# Patient Record
Sex: Female | Born: 1935
Health system: Southern US, Community
[De-identification: ages and names within clinical notes are randomized; demographics above are authoritative.]

## PROBLEM LIST (undated history)

## (undated) DIAGNOSIS — R7309 Other abnormal glucose: Secondary | ICD-10-CM

## (undated) DIAGNOSIS — I4821 Permanent atrial fibrillation: Secondary | ICD-10-CM

## (undated) DIAGNOSIS — I071 Rheumatic tricuspid insufficiency: Secondary | ICD-10-CM

## (undated) DIAGNOSIS — L02419 Cutaneous abscess of limb, unspecified: Secondary | ICD-10-CM

## (undated) DIAGNOSIS — I10 Essential (primary) hypertension: Secondary | ICD-10-CM

## (undated) DIAGNOSIS — I4891 Unspecified atrial fibrillation: Secondary | ICD-10-CM

## (undated) DIAGNOSIS — I5030 Unspecified diastolic (congestive) heart failure: Secondary | ICD-10-CM

## (undated) DIAGNOSIS — M5126 Other intervertebral disc displacement, lumbar region: Secondary | ICD-10-CM

## (undated) DIAGNOSIS — I429 Cardiomyopathy, unspecified: Secondary | ICD-10-CM

## (undated) DIAGNOSIS — K5730 Diverticulosis of large intestine without perforation or abscess without bleeding: Secondary | ICD-10-CM

## (undated) DIAGNOSIS — E663 Overweight: Secondary | ICD-10-CM

## (undated) DIAGNOSIS — R5383 Other fatigue: Secondary | ICD-10-CM

## (undated) DIAGNOSIS — J309 Allergic rhinitis, unspecified: Secondary | ICD-10-CM

## (undated) DIAGNOSIS — R7303 Prediabetes: Secondary | ICD-10-CM

## (undated) DIAGNOSIS — Z7901 Long term (current) use of anticoagulants: Secondary | ICD-10-CM

## (undated) DIAGNOSIS — I350 Nonrheumatic aortic (valve) stenosis: Secondary | ICD-10-CM

## (undated) DIAGNOSIS — E782 Mixed hyperlipidemia: Secondary | ICD-10-CM

## (undated) DIAGNOSIS — M1990 Unspecified osteoarthritis, unspecified site: Secondary | ICD-10-CM

## (undated) DIAGNOSIS — I2721 Secondary pulmonary arterial hypertension: Secondary | ICD-10-CM

## (undated) DIAGNOSIS — K635 Polyp of colon: Secondary | ICD-10-CM

## (undated) DIAGNOSIS — R161 Splenomegaly, not elsewhere classified: Secondary | ICD-10-CM

## (undated) DIAGNOSIS — I509 Heart failure, unspecified: Secondary | ICD-10-CM

## (undated) DIAGNOSIS — R5381 Other malaise: Secondary | ICD-10-CM

## (undated) DIAGNOSIS — K449 Diaphragmatic hernia without obstruction or gangrene: Secondary | ICD-10-CM

## (undated) DIAGNOSIS — K219 Gastro-esophageal reflux disease without esophagitis: Secondary | ICD-10-CM

## (undated) DIAGNOSIS — E119 Type 2 diabetes mellitus without complications: Secondary | ICD-10-CM

## (undated) DIAGNOSIS — I4892 Unspecified atrial flutter: Secondary | ICD-10-CM

## (undated) DIAGNOSIS — E785 Hyperlipidemia, unspecified: Secondary | ICD-10-CM

## (undated) DIAGNOSIS — J159 Unspecified bacterial pneumonia: Secondary | ICD-10-CM

## (undated) DIAGNOSIS — K222 Esophageal obstruction: Secondary | ICD-10-CM

## (undated) DIAGNOSIS — R609 Edema, unspecified: Secondary | ICD-10-CM

## (undated) HISTORY — DX: Unspecified osteoarthritis, unspecified site: M19.90

## (undated) HISTORY — DX: Edema, unspecified: R60.9

## (undated) HISTORY — DX: Other malaise: R53.81

## (undated) HISTORY — PX: COLONOSCOPY: SHX174

## (undated) HISTORY — DX: Other abnormal glucose: R73.09

## (undated) HISTORY — DX: Secondary pulmonary arterial hypertension: I27.21

## (undated) HISTORY — DX: Prediabetes: R73.03

## (undated) HISTORY — DX: Permanent atrial fibrillation: I48.21

## (undated) HISTORY — DX: Overweight: E66.3

## (undated) HISTORY — DX: Mixed hyperlipidemia: E78.2

## (undated) HISTORY — DX: Other fatigue: R53.83

## (undated) HISTORY — DX: Diverticulosis of large intestine without perforation or abscess without bleeding: K57.30

## (undated) HISTORY — DX: Hyperlipidemia, unspecified: E78.5

## (undated) HISTORY — DX: Rheumatic tricuspid insufficiency: I07.1

## (undated) HISTORY — DX: Esophageal obstruction: K22.2

## (undated) HISTORY — DX: Diaphragmatic hernia without obstruction or gangrene: K44.9

## (undated) HISTORY — DX: Unspecified diastolic (congestive) heart failure: I50.30

## (undated) HISTORY — PX: POLYPECTOMY: SHX149

## (undated) HISTORY — DX: Long term (current) use of anticoagulants: Z79.01

## (undated) HISTORY — DX: Unspecified bacterial pneumonia: J15.9

## (undated) HISTORY — DX: Essential (primary) hypertension: I10

## (undated) HISTORY — DX: Type 2 diabetes mellitus without complications: E11.9

## (undated) HISTORY — DX: Gastro-esophageal reflux disease without esophagitis: K21.9

## (undated) HISTORY — DX: Heart failure, unspecified: I50.9

## (undated) HISTORY — DX: Polyp of colon: K63.5

## (undated) HISTORY — DX: Splenomegaly, not elsewhere classified: R16.1

## (undated) HISTORY — DX: Unspecified atrial flutter: I48.92

## (undated) HISTORY — DX: Other intervertebral disc displacement, lumbar region: M51.26

## (undated) HISTORY — DX: Unspecified atrial fibrillation: I48.91

## (undated) HISTORY — DX: Nonrheumatic aortic (valve) stenosis: I35.0

## (undated) HISTORY — DX: Cutaneous abscess of limb, unspecified: L02.419

## (undated) HISTORY — DX: Cardiomyopathy, unspecified: I42.9

## (undated) HISTORY — PX: OTHER SURGICAL HISTORY: SHX169

## (undated) HISTORY — DX: Allergic rhinitis, unspecified: J30.9

---

## 1969-04-27 HISTORY — PX: ABDOMINAL HYSTERECTOMY: SHX81

## 1999-04-28 HISTORY — PX: ANKLE SURGERY: SHX546

## 1999-08-08 ENCOUNTER — Inpatient Hospital Stay (HOSPITAL_COMMUNITY): Admission: EM | Admit: 1999-08-08 | Discharge: 1999-08-11 | Payer: Self-pay | Admitting: *Deleted

## 1999-08-08 ENCOUNTER — Encounter: Payer: Self-pay | Admitting: Pulmonary Disease

## 1999-12-19 ENCOUNTER — Encounter (INDEPENDENT_AMBULATORY_CARE_PROVIDER_SITE_OTHER): Payer: Self-pay | Admitting: Specialist

## 1999-12-19 ENCOUNTER — Observation Stay (HOSPITAL_COMMUNITY): Admission: RE | Admit: 1999-12-19 | Discharge: 1999-12-20 | Payer: Self-pay | Admitting: Orthopedic Surgery

## 2000-01-26 ENCOUNTER — Encounter: Admission: RE | Admit: 2000-01-26 | Discharge: 2000-02-16 | Payer: Self-pay | Admitting: Orthopedic Surgery

## 2000-03-17 ENCOUNTER — Other Ambulatory Visit: Admission: RE | Admit: 2000-03-17 | Discharge: 2000-03-17 | Payer: Self-pay | Admitting: Obstetrics and Gynecology

## 2000-08-26 ENCOUNTER — Encounter: Payer: Self-pay | Admitting: Gastroenterology

## 2000-08-26 ENCOUNTER — Other Ambulatory Visit: Admission: RE | Admit: 2000-08-26 | Discharge: 2000-08-26 | Payer: Self-pay | Admitting: Gastroenterology

## 2000-08-26 ENCOUNTER — Encounter (INDEPENDENT_AMBULATORY_CARE_PROVIDER_SITE_OTHER): Payer: Self-pay | Admitting: Specialist

## 2000-08-26 DIAGNOSIS — D126 Benign neoplasm of colon, unspecified: Secondary | ICD-10-CM | POA: Insufficient documentation

## 2000-08-26 DIAGNOSIS — K5730 Diverticulosis of large intestine without perforation or abscess without bleeding: Secondary | ICD-10-CM | POA: Insufficient documentation

## 2000-09-28 ENCOUNTER — Ambulatory Visit (HOSPITAL_COMMUNITY): Admission: RE | Admit: 2000-09-28 | Discharge: 2000-09-28 | Payer: Self-pay | Admitting: *Deleted

## 2002-04-27 HISTORY — PX: CHOLECYSTECTOMY: SHX55

## 2004-03-26 ENCOUNTER — Encounter: Payer: Self-pay | Admitting: Family Medicine

## 2006-09-03 ENCOUNTER — Encounter: Payer: Self-pay | Admitting: Family Medicine

## 2006-10-20 ENCOUNTER — Encounter: Payer: Self-pay | Admitting: Cardiology

## 2006-11-12 ENCOUNTER — Encounter: Payer: Self-pay | Admitting: Family Medicine

## 2006-11-19 ENCOUNTER — Ambulatory Visit: Payer: Self-pay | Admitting: Cardiology

## 2006-11-19 ENCOUNTER — Encounter: Payer: Self-pay | Admitting: Family Medicine

## 2006-12-16 ENCOUNTER — Ambulatory Visit: Payer: Self-pay | Admitting: Family Medicine

## 2006-12-16 DIAGNOSIS — M25569 Pain in unspecified knee: Secondary | ICD-10-CM | POA: Insufficient documentation

## 2006-12-16 DIAGNOSIS — E785 Hyperlipidemia, unspecified: Secondary | ICD-10-CM

## 2006-12-16 DIAGNOSIS — M1990 Unspecified osteoarthritis, unspecified site: Secondary | ICD-10-CM | POA: Insufficient documentation

## 2006-12-16 DIAGNOSIS — I509 Heart failure, unspecified: Secondary | ICD-10-CM | POA: Insufficient documentation

## 2006-12-16 DIAGNOSIS — R609 Edema, unspecified: Secondary | ICD-10-CM | POA: Insufficient documentation

## 2006-12-16 DIAGNOSIS — K449 Diaphragmatic hernia without obstruction or gangrene: Secondary | ICD-10-CM | POA: Insufficient documentation

## 2006-12-16 DIAGNOSIS — I152 Hypertension secondary to endocrine disorders: Secondary | ICD-10-CM | POA: Insufficient documentation

## 2006-12-16 DIAGNOSIS — E1169 Type 2 diabetes mellitus with other specified complication: Secondary | ICD-10-CM | POA: Insufficient documentation

## 2006-12-16 DIAGNOSIS — I5030 Unspecified diastolic (congestive) heart failure: Secondary | ICD-10-CM | POA: Insufficient documentation

## 2006-12-16 DIAGNOSIS — I4891 Unspecified atrial fibrillation: Secondary | ICD-10-CM | POA: Insufficient documentation

## 2006-12-16 DIAGNOSIS — M5126 Other intervertebral disc displacement, lumbar region: Secondary | ICD-10-CM | POA: Insufficient documentation

## 2006-12-16 DIAGNOSIS — I10 Essential (primary) hypertension: Secondary | ICD-10-CM

## 2006-12-16 DIAGNOSIS — K219 Gastro-esophageal reflux disease without esophagitis: Secondary | ICD-10-CM | POA: Insufficient documentation

## 2006-12-16 LAB — CONVERTED CEMR LAB
INR: 2.9
Prothrombin Time: 20.7 s

## 2006-12-21 ENCOUNTER — Ambulatory Visit: Payer: Self-pay | Admitting: Family Medicine

## 2006-12-21 DIAGNOSIS — M810 Age-related osteoporosis without current pathological fracture: Secondary | ICD-10-CM | POA: Insufficient documentation

## 2006-12-22 ENCOUNTER — Encounter: Payer: Self-pay | Admitting: Family Medicine

## 2006-12-24 DIAGNOSIS — E118 Type 2 diabetes mellitus with unspecified complications: Secondary | ICD-10-CM | POA: Insufficient documentation

## 2006-12-24 LAB — CONVERTED CEMR LAB
ALT: 16 units/L (ref 0–35)
AST: 14 units/L (ref 0–37)
Albumin: 3.9 g/dL (ref 3.5–5.2)
Alkaline Phosphatase: 119 units/L — ABNORMAL HIGH (ref 39–117)
BUN: 8 mg/dL (ref 6–23)
Bilirubin, Direct: 0.2 mg/dL (ref 0.0–0.3)
CO2: 32 meq/L (ref 19–32)
Calcium: 9.7 mg/dL (ref 8.4–10.5)
Chloride: 105 meq/L (ref 96–112)
Cholesterol: 196 mg/dL (ref 0–200)
Creatinine, Ser: 0.7 mg/dL (ref 0.4–1.2)
GFR calc Af Amer: 105 mL/min
GFR calc non Af Amer: 87 mL/min
Glucose, Bld: 103 mg/dL — ABNORMAL HIGH (ref 70–99)
HDL: 45.5 mg/dL (ref >39.0)
LDL Cholesterol: 133 mg/dL — ABNORMAL HIGH (ref 0–99)
Potassium: 4.8 meq/L (ref 3.5–5.1)
Sodium: 141 meq/L (ref 135–145)
Total Bilirubin: 0.8 mg/dL (ref 0.3–1.2)
Total CHOL/HDL Ratio: 4.3
Total Protein: 6.9 g/dL (ref 6.0–8.3)
Triglycerides: 89 mg/dL (ref 0–149)
VLDL: 18 mg/dL (ref 0–40)

## 2006-12-27 LAB — CONVERTED CEMR LAB

## 2007-01-04 ENCOUNTER — Encounter: Payer: Self-pay | Admitting: Family Medicine

## 2007-01-05 ENCOUNTER — Ambulatory Visit: Payer: Self-pay | Admitting: Family Medicine

## 2007-01-10 ENCOUNTER — Telehealth: Payer: Self-pay | Admitting: Family Medicine

## 2007-01-12 ENCOUNTER — Ambulatory Visit: Payer: Self-pay | Admitting: Family Medicine

## 2007-01-12 LAB — CONVERTED CEMR LAB
INR: 6.9
INR: 6.9 (ref 0.8–1.0)
Prothrombin Time: 34.2 s
Prothrombin Time: 34.2 s (ref 10.9–13.3)

## 2007-01-14 ENCOUNTER — Ambulatory Visit: Payer: Self-pay | Admitting: Family Medicine

## 2007-01-14 LAB — CONVERTED CEMR LAB
INR: 4.6 — ABNORMAL HIGH (ref 0.8–1.0)
Prothrombin Time: 27.6 s — ABNORMAL HIGH (ref 10.9–13.3)

## 2007-01-17 ENCOUNTER — Ambulatory Visit: Payer: Self-pay | Admitting: Family Medicine

## 2007-01-17 ENCOUNTER — Ambulatory Visit: Payer: Self-pay | Admitting: Gastroenterology

## 2007-01-17 LAB — CONVERTED CEMR LAB
Basophils Absolute: 0.1 10*3/uL (ref 0.0–0.1)
Basophils Relative: 0.7 % (ref 0.0–1.0)
Eosinophils Absolute: 0.3 10*3/uL (ref 0.0–0.6)
Eosinophils Relative: 3.5 % (ref 0.0–5.0)
HCT: 39.7 % (ref 36.0–46.0)
Hemoglobin: 13.6 g/dL (ref 12.0–15.0)
INR: 1.5
Lymphocytes Relative: 32.1 % (ref 12.0–46.0)
MCHC: 34.2 g/dL (ref 30.0–36.0)
MCV: 89.1 fL (ref 78.0–100.0)
Monocytes Absolute: 0.7 10*3/uL (ref 0.2–0.7)
Monocytes Relative: 9 % (ref 3.0–11.0)
Neutro Abs: 3.9 10*3/uL (ref 1.4–7.7)
Neutrophils Relative %: 54.7 % (ref 43.0–77.0)
Platelets: 284 10*3/uL (ref 150–400)
Prothrombin Time: 15.3 s
RBC: 4.46 M/uL (ref 3.87–5.11)
RDW: 14.3 % (ref 11.5–14.6)
WBC: 7.4 10*3/uL (ref 4.5–10.5)

## 2007-01-18 ENCOUNTER — Encounter: Payer: Self-pay | Admitting: Family Medicine

## 2007-01-19 ENCOUNTER — Ambulatory Visit: Payer: Self-pay | Admitting: Family Medicine

## 2007-01-26 HISTORY — PX: ESOPHAGEAL DILATION: SHX303

## 2007-02-01 ENCOUNTER — Encounter: Payer: Self-pay | Admitting: Family Medicine

## 2007-02-01 ENCOUNTER — Encounter: Payer: Self-pay | Admitting: Gastroenterology

## 2007-02-01 ENCOUNTER — Ambulatory Visit: Payer: Self-pay | Admitting: Gastroenterology

## 2007-02-01 DIAGNOSIS — D139 Benign neoplasm of ill-defined sites within the digestive system: Secondary | ICD-10-CM | POA: Insufficient documentation

## 2007-02-01 DIAGNOSIS — K222 Esophageal obstruction: Secondary | ICD-10-CM | POA: Insufficient documentation

## 2007-02-10 ENCOUNTER — Encounter: Payer: Self-pay | Admitting: Family Medicine

## 2007-02-17 ENCOUNTER — Ambulatory Visit: Payer: Self-pay | Admitting: Family Medicine

## 2007-02-17 LAB — CONVERTED CEMR LAB
INR: 3.7
Prothrombin Time: 23.2 s

## 2007-02-18 LAB — CONVERTED CEMR LAB
TSH: 1.38 microintl units/mL (ref 0.35–5.50)
Vitamin B-12: 429 pg/mL (ref 211–911)

## 2007-03-03 ENCOUNTER — Ambulatory Visit: Payer: Self-pay | Admitting: Family Medicine

## 2007-03-03 LAB — CONVERTED CEMR LAB: INR: 4.8

## 2007-03-07 ENCOUNTER — Ambulatory Visit: Payer: Self-pay | Admitting: Family Medicine

## 2007-03-07 LAB — CONVERTED CEMR LAB
INR: 1.3
Prothrombin Time: 14.1 s

## 2007-03-11 ENCOUNTER — Ambulatory Visit: Payer: Self-pay | Admitting: Family Medicine

## 2007-03-11 LAB — CONVERTED CEMR LAB
Bilirubin Urine: NEGATIVE
Epithelial cells, urine: 0 /lpf
KOH Prep: 0
Ketones, urine, test strip: NEGATIVE
Nitrite: NEGATIVE
RBC / HPF: 0
Specific Gravity, Urine: >=1.03
Urine crystals, microscopic: 0 /hpf
Urobilinogen, UA: NEGATIVE

## 2007-03-14 ENCOUNTER — Ambulatory Visit: Payer: Self-pay | Admitting: Family Medicine

## 2007-03-14 LAB — CONVERTED CEMR LAB
INR: 4.1
Prothrombin Time: 24.6 s

## 2007-03-21 ENCOUNTER — Ambulatory Visit: Payer: Self-pay | Admitting: Family Medicine

## 2007-03-28 ENCOUNTER — Ambulatory Visit: Payer: Self-pay | Admitting: Family Medicine

## 2007-04-07 ENCOUNTER — Ambulatory Visit: Payer: Self-pay | Admitting: Cardiology

## 2007-04-07 LAB — CONVERTED CEMR LAB
CO2: 32 meq/L (ref 19–32)
Calcium: 9.3 mg/dL (ref 8.4–10.5)
Chloride: 105 meq/L (ref 96–112)
Creatinine, Ser: 0.8 mg/dL (ref 0.4–1.2)
Glucose, Bld: 96 mg/dL (ref 70–99)
Sodium: 142 meq/L (ref 135–145)

## 2007-04-11 ENCOUNTER — Ambulatory Visit: Payer: Self-pay | Admitting: Family Medicine

## 2007-04-11 LAB — CONVERTED CEMR LAB: Prothrombin Time: 25.3 s

## 2007-04-13 ENCOUNTER — Encounter: Payer: Self-pay | Admitting: Family Medicine

## 2007-04-14 ENCOUNTER — Telehealth: Payer: Self-pay | Admitting: Family Medicine

## 2007-04-15 ENCOUNTER — Ambulatory Visit: Payer: Self-pay | Admitting: Family Medicine

## 2007-04-15 LAB — CONVERTED CEMR LAB
INR: 3
Prothrombin Time: 20.8 s

## 2007-04-19 ENCOUNTER — Ambulatory Visit: Payer: Self-pay | Admitting: Cardiology

## 2007-04-22 ENCOUNTER — Ambulatory Visit: Payer: Self-pay | Admitting: Family Medicine

## 2007-04-22 LAB — CONVERTED CEMR LAB
INR: 2.2
Prothrombin Time: 18.2 s

## 2007-04-27 ENCOUNTER — Ambulatory Visit: Payer: Self-pay

## 2007-04-27 ENCOUNTER — Encounter: Payer: Self-pay | Admitting: Family Medicine

## 2007-05-02 ENCOUNTER — Ambulatory Visit: Payer: Self-pay | Admitting: Family Medicine

## 2007-05-02 ENCOUNTER — Telehealth: Payer: Self-pay | Admitting: Family Medicine

## 2007-05-04 ENCOUNTER — Ambulatory Visit: Payer: Self-pay | Admitting: Cardiology

## 2007-05-16 ENCOUNTER — Telehealth: Payer: Self-pay | Admitting: Family Medicine

## 2007-05-16 ENCOUNTER — Ambulatory Visit: Payer: Self-pay | Admitting: Family Medicine

## 2007-05-16 LAB — CONVERTED CEMR LAB: Prothrombin Time: 15.9 s

## 2007-05-19 ENCOUNTER — Ambulatory Visit: Payer: Self-pay | Admitting: Family Medicine

## 2007-05-30 ENCOUNTER — Ambulatory Visit: Payer: Self-pay | Admitting: Family Medicine

## 2007-06-13 ENCOUNTER — Ambulatory Visit: Payer: Self-pay | Admitting: Family Medicine

## 2007-06-13 LAB — CONVERTED CEMR LAB: INR: 1.6

## 2007-06-15 ENCOUNTER — Telehealth: Payer: Self-pay | Admitting: Family Medicine

## 2007-06-15 ENCOUNTER — Ambulatory Visit: Payer: Self-pay | Admitting: Family Medicine

## 2007-06-22 LAB — CONVERTED CEMR LAB
AST: 14 units/L (ref 0–37)
Bilirubin, Direct: 0.1 mg/dL (ref 0.0–0.3)
CO2: 31 meq/L (ref 19–32)
Chloride: 106 meq/L (ref 96–112)
Cholesterol: 170 mg/dL (ref 0–200)
Creatinine, Ser: 1 mg/dL (ref 0.4–1.2)
Glucose, Bld: 107 mg/dL — ABNORMAL HIGH (ref 70–99)
HDL: 46.4 mg/dL (ref >39.0)
Sodium: 142 meq/L (ref 135–145)
Total Bilirubin: 0.7 mg/dL (ref 0.3–1.2)

## 2007-06-29 ENCOUNTER — Ambulatory Visit: Payer: Self-pay | Admitting: Family Medicine

## 2007-06-29 ENCOUNTER — Telehealth: Payer: Self-pay | Admitting: Family Medicine

## 2007-06-29 LAB — CONVERTED CEMR LAB: INR: 2.6

## 2007-07-27 ENCOUNTER — Ambulatory Visit: Payer: Self-pay | Admitting: Family Medicine

## 2007-07-27 LAB — CONVERTED CEMR LAB: INR: 2.2

## 2007-08-23 ENCOUNTER — Ambulatory Visit: Payer: Self-pay | Admitting: Family Medicine

## 2007-08-23 LAB — CONVERTED CEMR LAB
INR: 3.2
Prothrombin Time: 21.5 s

## 2007-09-06 ENCOUNTER — Ambulatory Visit: Payer: Self-pay | Admitting: Family Medicine

## 2007-09-06 LAB — CONVERTED CEMR LAB
INR: 3.1
Prothrombin Time: 21.1 s

## 2007-09-21 ENCOUNTER — Ambulatory Visit: Payer: Self-pay | Admitting: Family Medicine

## 2007-10-19 ENCOUNTER — Ambulatory Visit: Payer: Self-pay | Admitting: Family Medicine

## 2007-10-19 LAB — CONVERTED CEMR LAB: Prothrombin Time: 22 s

## 2007-10-24 ENCOUNTER — Ambulatory Visit: Payer: Self-pay | Admitting: Cardiology

## 2007-11-16 ENCOUNTER — Ambulatory Visit: Payer: Self-pay | Admitting: Family Medicine

## 2007-11-16 LAB — CONVERTED CEMR LAB
INR: 3.8
Prothrombin Time: 23.6 s

## 2007-11-29 ENCOUNTER — Ambulatory Visit: Payer: Self-pay | Admitting: Family Medicine

## 2007-12-07 ENCOUNTER — Ambulatory Visit: Payer: Self-pay | Admitting: Family Medicine

## 2007-12-13 ENCOUNTER — Ambulatory Visit: Payer: Self-pay | Admitting: Family Medicine

## 2007-12-13 LAB — CONVERTED CEMR LAB: Prothrombin Time: 25.8 s

## 2007-12-20 ENCOUNTER — Ambulatory Visit: Payer: Self-pay | Admitting: Family Medicine

## 2007-12-27 ENCOUNTER — Ambulatory Visit: Payer: Self-pay | Admitting: Cardiology

## 2008-01-09 ENCOUNTER — Ambulatory Visit: Payer: Self-pay | Admitting: Family Medicine

## 2008-01-09 LAB — CONVERTED CEMR LAB: Prothrombin Time: 21 s

## 2008-01-11 LAB — CONVERTED CEMR LAB
Cholesterol: 130 mg/dL (ref 0–200)
HDL: 40.8 mg/dL (ref >39.0)
Total CHOL/HDL Ratio: 3.2
Triglycerides: 75 mg/dL (ref 0–149)

## 2008-01-12 ENCOUNTER — Telehealth: Payer: Self-pay | Admitting: Gastroenterology

## 2008-01-12 ENCOUNTER — Ambulatory Visit: Payer: Self-pay | Admitting: Family Medicine

## 2008-01-16 ENCOUNTER — Telehealth: Payer: Self-pay | Admitting: Gastroenterology

## 2008-01-25 ENCOUNTER — Ambulatory Visit: Payer: Self-pay | Admitting: Gastroenterology

## 2008-01-26 ENCOUNTER — Telehealth: Payer: Self-pay | Admitting: Family Medicine

## 2008-02-01 ENCOUNTER — Encounter: Payer: Self-pay | Admitting: Gastroenterology

## 2008-02-01 ENCOUNTER — Ambulatory Visit: Payer: Self-pay | Admitting: Gastroenterology

## 2008-02-01 LAB — HM COLONOSCOPY

## 2008-02-02 ENCOUNTER — Encounter: Payer: Self-pay | Admitting: Family Medicine

## 2008-02-02 ENCOUNTER — Telehealth: Payer: Self-pay | Admitting: Family Medicine

## 2008-02-06 ENCOUNTER — Encounter: Payer: Self-pay | Admitting: Gastroenterology

## 2008-02-06 ENCOUNTER — Ambulatory Visit: Payer: Self-pay | Admitting: Family Medicine

## 2008-02-06 LAB — CONVERTED CEMR LAB: INR: 1.4

## 2008-02-17 ENCOUNTER — Telehealth: Payer: Self-pay | Admitting: Gastroenterology

## 2008-02-20 ENCOUNTER — Telehealth: Payer: Self-pay | Admitting: Family Medicine

## 2008-02-20 ENCOUNTER — Encounter (INDEPENDENT_AMBULATORY_CARE_PROVIDER_SITE_OTHER): Payer: Self-pay | Admitting: *Deleted

## 2008-02-20 ENCOUNTER — Ambulatory Visit: Payer: Self-pay | Admitting: Family Medicine

## 2008-02-20 LAB — CONVERTED CEMR LAB
INR: 3.2
Prothrombin Time: 21.6 s

## 2008-02-28 ENCOUNTER — Ambulatory Visit: Payer: Self-pay | Admitting: Family Medicine

## 2008-03-06 ENCOUNTER — Telehealth: Payer: Self-pay | Admitting: Family Medicine

## 2008-03-08 ENCOUNTER — Ambulatory Visit: Payer: Self-pay | Admitting: Family Medicine

## 2008-03-08 LAB — CONVERTED CEMR LAB
INR: 2.2
Prothrombin Time: 18 s

## 2008-03-19 ENCOUNTER — Telehealth: Payer: Self-pay | Admitting: Family Medicine

## 2008-03-23 ENCOUNTER — Ambulatory Visit: Payer: Self-pay | Admitting: Family Medicine

## 2008-03-23 DIAGNOSIS — R05 Cough: Secondary | ICD-10-CM

## 2008-03-23 DIAGNOSIS — J301 Allergic rhinitis due to pollen: Secondary | ICD-10-CM | POA: Insufficient documentation

## 2008-03-23 DIAGNOSIS — R059 Cough, unspecified: Secondary | ICD-10-CM | POA: Insufficient documentation

## 2008-04-05 ENCOUNTER — Ambulatory Visit: Payer: Self-pay | Admitting: Family Medicine

## 2008-04-06 ENCOUNTER — Telehealth: Payer: Self-pay | Admitting: Family Medicine

## 2008-04-10 ENCOUNTER — Telehealth: Payer: Self-pay | Admitting: Family Medicine

## 2008-05-03 ENCOUNTER — Ambulatory Visit: Payer: Self-pay | Admitting: Family Medicine

## 2008-05-03 LAB — CONVERTED CEMR LAB: Prothrombin Time: 20.2 s

## 2008-05-15 ENCOUNTER — Telehealth: Payer: Self-pay | Admitting: Family Medicine

## 2008-05-30 ENCOUNTER — Ambulatory Visit: Payer: Self-pay | Admitting: Family Medicine

## 2008-05-30 LAB — CONVERTED CEMR LAB: INR: 2.5

## 2008-06-27 ENCOUNTER — Ambulatory Visit: Payer: Self-pay | Admitting: Family Medicine

## 2008-06-27 LAB — CONVERTED CEMR LAB
INR: 2.6
Prothrombin Time: 19.7 s

## 2008-07-02 ENCOUNTER — Ambulatory Visit: Payer: Self-pay | Admitting: Family Medicine

## 2008-07-03 ENCOUNTER — Ambulatory Visit: Payer: Self-pay | Admitting: Cardiology

## 2008-07-11 ENCOUNTER — Ambulatory Visit: Payer: Self-pay | Admitting: Family Medicine

## 2008-07-11 LAB — CONVERTED CEMR LAB
ALT: 11 units/L (ref 0–35)
Albumin: 3.6 g/dL (ref 3.5–5.2)
BUN: 14 mg/dL (ref 6–23)
CO2: 32 meq/L (ref 19–32)
Calcium: 9.1 mg/dL (ref 8.4–10.5)
Chloride: 106 meq/L (ref 96–112)
Cholesterol: 147 mg/dL (ref 0–200)
Creatinine, Ser: 0.8 mg/dL (ref 0.4–1.2)
Glucose, Bld: 106 mg/dL — ABNORMAL HIGH (ref 70–99)
HDL: 46.3 mg/dL (ref >39.00)
Total Protein: 6 g/dL (ref 6.0–8.3)
Triglycerides: 97 mg/dL (ref 0.0–149.0)

## 2008-07-13 ENCOUNTER — Ambulatory Visit: Payer: Self-pay | Admitting: Family Medicine

## 2008-07-20 ENCOUNTER — Telehealth: Payer: Self-pay | Admitting: Family Medicine

## 2008-07-23 ENCOUNTER — Telehealth: Payer: Self-pay | Admitting: Family Medicine

## 2008-07-25 ENCOUNTER — Ambulatory Visit: Payer: Self-pay | Admitting: Family Medicine

## 2008-07-25 LAB — CONVERTED CEMR LAB: INR: 1.7

## 2008-08-02 ENCOUNTER — Ambulatory Visit: Payer: Self-pay | Admitting: Family Medicine

## 2008-08-02 LAB — CONVERTED CEMR LAB: Prothrombin Time: 21.6 s

## 2008-08-09 ENCOUNTER — Telehealth: Payer: Self-pay | Admitting: Gastroenterology

## 2008-08-17 ENCOUNTER — Ambulatory Visit: Payer: Self-pay | Admitting: Family Medicine

## 2008-08-17 LAB — CONVERTED CEMR LAB: INR: 2.3

## 2008-09-06 ENCOUNTER — Telehealth: Payer: Self-pay | Admitting: Family Medicine

## 2008-09-17 ENCOUNTER — Ambulatory Visit: Payer: Self-pay | Admitting: Family Medicine

## 2008-09-21 ENCOUNTER — Telehealth: Payer: Self-pay | Admitting: Family Medicine

## 2008-10-02 DIAGNOSIS — I429 Cardiomyopathy, unspecified: Secondary | ICD-10-CM | POA: Insufficient documentation

## 2008-10-15 ENCOUNTER — Ambulatory Visit: Payer: Self-pay | Admitting: Family Medicine

## 2008-10-15 LAB — CONVERTED CEMR LAB
INR: 2.5
Prothrombin Time: 19.3 s

## 2008-11-01 ENCOUNTER — Ambulatory Visit: Payer: Self-pay | Admitting: Family Medicine

## 2008-11-13 ENCOUNTER — Ambulatory Visit: Payer: Self-pay | Admitting: Family Medicine

## 2008-11-13 LAB — CONVERTED CEMR LAB: Prothrombin Time: 16.2 s

## 2008-11-16 ENCOUNTER — Telehealth: Payer: Self-pay | Admitting: Family Medicine

## 2008-11-19 ENCOUNTER — Ambulatory Visit: Payer: Self-pay | Admitting: Family Medicine

## 2008-11-19 DIAGNOSIS — M545 Low back pain, unspecified: Secondary | ICD-10-CM | POA: Insufficient documentation

## 2008-11-27 ENCOUNTER — Ambulatory Visit: Payer: Self-pay | Admitting: Family Medicine

## 2008-11-27 LAB — CONVERTED CEMR LAB
INR: 2.4
Prothrombin Time: 18.7 s

## 2008-11-28 ENCOUNTER — Telehealth: Payer: Self-pay | Admitting: Family Medicine

## 2008-12-11 ENCOUNTER — Encounter: Admission: RE | Admit: 2008-12-11 | Discharge: 2008-12-11 | Payer: Self-pay | Admitting: Family Medicine

## 2008-12-11 ENCOUNTER — Ambulatory Visit: Payer: Self-pay | Admitting: Family Medicine

## 2008-12-11 DIAGNOSIS — M79609 Pain in unspecified limb: Secondary | ICD-10-CM | POA: Insufficient documentation

## 2008-12-25 ENCOUNTER — Ambulatory Visit: Payer: Self-pay | Admitting: Family Medicine

## 2009-01-01 ENCOUNTER — Ambulatory Visit: Payer: Self-pay | Admitting: Family Medicine

## 2009-01-02 ENCOUNTER — Encounter: Payer: Self-pay | Admitting: Family Medicine

## 2009-01-16 ENCOUNTER — Encounter: Payer: Self-pay | Admitting: Family Medicine

## 2009-01-18 ENCOUNTER — Encounter: Payer: Self-pay | Admitting: Family Medicine

## 2009-01-21 ENCOUNTER — Telehealth: Payer: Self-pay | Admitting: Family Medicine

## 2009-01-22 ENCOUNTER — Ambulatory Visit: Payer: Self-pay | Admitting: Family Medicine

## 2009-01-22 LAB — CONVERTED CEMR LAB
INR: 3.1
Prothrombin Time: 21.4 s

## 2009-01-31 ENCOUNTER — Ambulatory Visit: Payer: Self-pay | Admitting: Family Medicine

## 2009-02-07 ENCOUNTER — Encounter: Payer: Self-pay | Admitting: Family Medicine

## 2009-02-19 ENCOUNTER — Ambulatory Visit: Payer: Self-pay | Admitting: Family Medicine

## 2009-02-19 LAB — CONVERTED CEMR LAB: INR: 2.3

## 2009-02-20 LAB — CONVERTED CEMR LAB
ALT: 14 units/L (ref 0–35)
BUN: 10 mg/dL (ref 6–23)
Basophils Absolute: 0 10*3/uL (ref 0.0–0.1)
Bilirubin, Direct: 0 mg/dL (ref 0.0–0.3)
Chloride: 104 meq/L (ref 96–112)
Cholesterol: 160 mg/dL (ref 0–200)
Creatinine, Ser: 0.8 mg/dL (ref 0.4–1.2)
Eosinophils Absolute: 0.2 10*3/uL (ref 0.0–0.7)
Glucose, Bld: 103 mg/dL — ABNORMAL HIGH (ref 70–99)
HCT: 39.8 % (ref 36.0–46.0)
Hgb A1c MFr Bld: 6.4 % (ref 4.6–6.5)
LDL Cholesterol: 90 mg/dL (ref 0–99)
Lymphs Abs: 2.1 10*3/uL (ref 0.7–4.0)
MCHC: 34.1 g/dL (ref 30.0–36.0)
MCV: 90.7 fL (ref 78.0–100.0)
Monocytes Absolute: 0.5 10*3/uL (ref 0.1–1.0)
Neutrophils Relative %: 60.9 % (ref 43.0–77.0)
Platelets: 186 10*3/uL (ref 150.0–400.0)
Potassium: 4 meq/L (ref 3.5–5.1)
RDW: 15.4 % — ABNORMAL HIGH (ref 11.5–14.6)
TSH: 1.55 microintl units/mL (ref 0.35–5.50)
Total Bilirubin: 0.7 mg/dL (ref 0.3–1.2)
Triglycerides: 79 mg/dL (ref 0.0–149.0)
Vit D, 25-Hydroxy: 16 ng/mL — ABNORMAL LOW (ref 30–89)

## 2009-02-26 ENCOUNTER — Ambulatory Visit: Payer: Self-pay | Admitting: Family Medicine

## 2009-02-26 DIAGNOSIS — E559 Vitamin D deficiency, unspecified: Secondary | ICD-10-CM | POA: Insufficient documentation

## 2009-03-01 ENCOUNTER — Encounter: Payer: Self-pay | Admitting: Family Medicine

## 2009-03-19 ENCOUNTER — Ambulatory Visit: Payer: Self-pay | Admitting: Family Medicine

## 2009-03-19 DIAGNOSIS — R5383 Other fatigue: Secondary | ICD-10-CM | POA: Insufficient documentation

## 2009-03-19 DIAGNOSIS — R5381 Other malaise: Secondary | ICD-10-CM

## 2009-03-26 ENCOUNTER — Telehealth: Payer: Self-pay | Admitting: Family Medicine

## 2009-03-29 ENCOUNTER — Telehealth: Payer: Self-pay | Admitting: Family Medicine

## 2009-04-18 ENCOUNTER — Ambulatory Visit: Payer: Self-pay | Admitting: Family Medicine

## 2009-04-18 ENCOUNTER — Telehealth: Payer: Self-pay | Admitting: Family Medicine

## 2009-04-18 LAB — CONVERTED CEMR LAB: Prothrombin Time: 18.2 s

## 2009-04-23 ENCOUNTER — Ambulatory Visit: Payer: Self-pay | Admitting: Family Medicine

## 2009-04-25 ENCOUNTER — Telehealth: Payer: Self-pay | Admitting: Family Medicine

## 2009-04-28 ENCOUNTER — Telehealth: Payer: Self-pay | Admitting: Family Medicine

## 2009-04-29 ENCOUNTER — Telehealth: Payer: Self-pay | Admitting: Family Medicine

## 2009-04-30 ENCOUNTER — Encounter: Payer: Self-pay | Admitting: Family Medicine

## 2009-05-10 ENCOUNTER — Telehealth: Payer: Self-pay | Admitting: Family Medicine

## 2009-05-16 ENCOUNTER — Ambulatory Visit: Payer: Self-pay | Admitting: Family Medicine

## 2009-05-16 LAB — CONVERTED CEMR LAB: INR: 1.9

## 2009-05-22 ENCOUNTER — Telehealth: Payer: Self-pay | Admitting: Family Medicine

## 2009-05-27 ENCOUNTER — Ambulatory Visit: Payer: Self-pay | Admitting: Family Medicine

## 2009-05-27 LAB — CONVERTED CEMR LAB: Hgb A1c MFr Bld: 6.3 % (ref 4.6–6.5)

## 2009-05-28 LAB — CONVERTED CEMR LAB: Vit D, 25-Hydroxy: 36 ng/mL (ref 30–89)

## 2009-06-04 ENCOUNTER — Ambulatory Visit: Payer: Self-pay | Admitting: Family Medicine

## 2009-06-08 ENCOUNTER — Ambulatory Visit: Payer: Self-pay | Admitting: Family Medicine

## 2009-06-14 ENCOUNTER — Telehealth: Payer: Self-pay | Admitting: Family Medicine

## 2009-06-18 ENCOUNTER — Ambulatory Visit: Payer: Self-pay | Admitting: Family Medicine

## 2009-06-18 LAB — CONVERTED CEMR LAB: INR: 2

## 2009-06-20 ENCOUNTER — Telehealth: Payer: Self-pay | Admitting: Family Medicine

## 2009-06-28 ENCOUNTER — Ambulatory Visit: Payer: Self-pay | Admitting: Internal Medicine

## 2009-07-03 ENCOUNTER — Encounter: Payer: Self-pay | Admitting: Family Medicine

## 2009-07-03 ENCOUNTER — Telehealth: Payer: Self-pay | Admitting: Family Medicine

## 2009-07-03 DIAGNOSIS — J4540 Moderate persistent asthma, uncomplicated: Secondary | ICD-10-CM | POA: Insufficient documentation

## 2009-07-08 ENCOUNTER — Telehealth: Payer: Self-pay | Admitting: Family Medicine

## 2009-07-09 ENCOUNTER — Telehealth: Payer: Self-pay | Admitting: Family Medicine

## 2009-07-16 ENCOUNTER — Ambulatory Visit: Payer: Self-pay | Admitting: Family Medicine

## 2009-07-18 ENCOUNTER — Telehealth: Payer: Self-pay | Admitting: Family Medicine

## 2009-07-22 ENCOUNTER — Telehealth: Payer: Self-pay | Admitting: Family Medicine

## 2009-07-25 ENCOUNTER — Encounter: Payer: Self-pay | Admitting: Family Medicine

## 2009-07-25 ENCOUNTER — Telehealth: Payer: Self-pay | Admitting: Family Medicine

## 2009-08-07 ENCOUNTER — Ambulatory Visit: Payer: Self-pay | Admitting: Family Medicine

## 2009-08-13 ENCOUNTER — Ambulatory Visit: Payer: Self-pay | Admitting: Family Medicine

## 2009-08-13 LAB — CONVERTED CEMR LAB
INR: 1.9
Prothrombin Time: 23.3 s

## 2009-08-21 ENCOUNTER — Telehealth: Payer: Self-pay | Admitting: Family Medicine

## 2009-08-23 ENCOUNTER — Telehealth: Payer: Self-pay | Admitting: Family Medicine

## 2009-09-02 ENCOUNTER — Ambulatory Visit: Payer: Self-pay | Admitting: Cardiovascular Disease

## 2009-09-03 ENCOUNTER — Telehealth: Payer: Self-pay | Admitting: Family Medicine

## 2009-09-09 ENCOUNTER — Telehealth: Payer: Self-pay | Admitting: Family Medicine

## 2009-09-10 ENCOUNTER — Ambulatory Visit: Payer: Self-pay | Admitting: Family Medicine

## 2009-09-10 LAB — CONVERTED CEMR LAB: INR: 1.6

## 2009-09-13 ENCOUNTER — Telehealth: Payer: Self-pay | Admitting: Family Medicine

## 2009-09-17 ENCOUNTER — Telehealth: Payer: Self-pay | Admitting: Family Medicine

## 2009-09-19 ENCOUNTER — Encounter: Payer: Self-pay | Admitting: Cardiovascular Disease

## 2009-09-19 ENCOUNTER — Ambulatory Visit: Payer: Self-pay

## 2009-09-25 ENCOUNTER — Ambulatory Visit: Payer: Self-pay | Admitting: Family Medicine

## 2009-09-25 LAB — CONVERTED CEMR LAB
INR: 1.4
Prothrombin Time: 16.5 s

## 2009-09-26 ENCOUNTER — Telehealth: Payer: Self-pay | Admitting: Cardiovascular Disease

## 2009-10-09 ENCOUNTER — Ambulatory Visit: Payer: Self-pay | Admitting: Family Medicine

## 2009-10-21 ENCOUNTER — Telehealth: Payer: Self-pay | Admitting: Family Medicine

## 2009-11-06 ENCOUNTER — Ambulatory Visit: Payer: Self-pay | Admitting: Family Medicine

## 2009-11-06 LAB — CONVERTED CEMR LAB
INR: 3.6
Prothrombin Time: 43.3 s

## 2009-11-20 ENCOUNTER — Ambulatory Visit: Payer: Self-pay | Admitting: Family Medicine

## 2009-11-20 LAB — CONVERTED CEMR LAB: Prothrombin Time: 31.2 s

## 2009-12-18 ENCOUNTER — Ambulatory Visit: Payer: Self-pay | Admitting: Family Medicine

## 2009-12-18 LAB — CONVERTED CEMR LAB
INR: 1.9
Prothrombin Time: 23.2 s

## 2010-01-13 ENCOUNTER — Ambulatory Visit: Payer: Self-pay | Admitting: Family Medicine

## 2010-01-13 ENCOUNTER — Telehealth: Payer: Self-pay | Admitting: Family Medicine

## 2010-01-16 ENCOUNTER — Telehealth: Payer: Self-pay | Admitting: Family Medicine

## 2010-01-28 ENCOUNTER — Telehealth: Payer: Self-pay | Admitting: Family Medicine

## 2010-01-31 ENCOUNTER — Ambulatory Visit: Payer: Self-pay | Admitting: Family Medicine

## 2010-02-10 ENCOUNTER — Ambulatory Visit: Payer: Self-pay | Admitting: Family Medicine

## 2010-02-14 ENCOUNTER — Encounter: Payer: Self-pay | Admitting: Cardiology

## 2010-02-14 ENCOUNTER — Encounter: Payer: Self-pay | Admitting: Family Medicine

## 2010-02-26 ENCOUNTER — Ambulatory Visit: Payer: Self-pay | Admitting: Family Medicine

## 2010-02-26 ENCOUNTER — Telehealth (INDEPENDENT_AMBULATORY_CARE_PROVIDER_SITE_OTHER): Payer: Self-pay | Admitting: *Deleted

## 2010-02-28 LAB — CONVERTED CEMR LAB
ALT: 17 units/L (ref 0–35)
AST: 15 units/L (ref 0–37)
Albumin: 3.7 g/dL (ref 3.5–5.2)
BUN: 14 mg/dL (ref 6–23)
Chloride: 105 meq/L (ref 96–112)
Cholesterol: 168 mg/dL (ref 0–200)
Creatinine, Ser: 0.8 mg/dL (ref 0.4–1.2)
Creatinine,U: 180.4 mg/dL
Glucose, Bld: 103 mg/dL — ABNORMAL HIGH (ref 70–99)
LDL Cholesterol: 103 mg/dL — ABNORMAL HIGH (ref 0–99)
Microalb Creat Ratio: 1.4 mg/g (ref 0.0–30.0)
Microalb, Ur: 2.6 mg/dL — ABNORMAL HIGH (ref 0.0–1.9)
Potassium: 4 meq/L (ref 3.5–5.1)
Total Protein: 6.3 g/dL (ref 6.0–8.3)
Triglycerides: 78 mg/dL (ref 0.0–149.0)

## 2010-03-05 ENCOUNTER — Ambulatory Visit: Payer: Self-pay | Admitting: Family Medicine

## 2010-03-10 ENCOUNTER — Ambulatory Visit: Payer: Self-pay | Admitting: Family Medicine

## 2010-03-10 LAB — CONVERTED CEMR LAB
INR: 2.1
Prothrombin Time: 25.7 s

## 2010-03-12 ENCOUNTER — Ambulatory Visit: Payer: Self-pay | Admitting: Cardiovascular Disease

## 2010-03-17 ENCOUNTER — Ambulatory Visit: Payer: Self-pay | Admitting: Cardiovascular Disease

## 2010-03-18 ENCOUNTER — Telehealth: Payer: Self-pay | Admitting: Family Medicine

## 2010-04-04 ENCOUNTER — Telehealth: Payer: Self-pay | Admitting: Family Medicine

## 2010-04-07 ENCOUNTER — Telehealth: Payer: Self-pay | Admitting: Family Medicine

## 2010-04-07 ENCOUNTER — Ambulatory Visit: Payer: Self-pay | Admitting: Family Medicine

## 2010-04-11 ENCOUNTER — Encounter: Payer: Self-pay | Admitting: Cardiovascular Disease

## 2010-04-11 ENCOUNTER — Ambulatory Visit: Payer: Self-pay | Admitting: Cardiovascular Disease

## 2010-04-25 ENCOUNTER — Encounter: Payer: Self-pay | Admitting: Family Medicine

## 2010-04-25 ENCOUNTER — Telehealth: Payer: Self-pay | Admitting: Family Medicine

## 2010-05-05 ENCOUNTER — Ambulatory Visit
Admission: RE | Admit: 2010-05-05 | Discharge: 2010-05-05 | Payer: Self-pay | Source: Home / Self Care | Attending: Family Medicine | Admitting: Family Medicine

## 2010-05-05 LAB — CONVERTED CEMR LAB
INR: 3.8
Prothrombin Time: 45.6 s

## 2010-05-19 ENCOUNTER — Ambulatory Visit
Admission: RE | Admit: 2010-05-19 | Discharge: 2010-05-19 | Payer: Self-pay | Source: Home / Self Care | Attending: Family Medicine | Admitting: Family Medicine

## 2010-05-19 ENCOUNTER — Telehealth: Payer: Self-pay | Admitting: Family Medicine

## 2010-05-19 LAB — CONVERTED CEMR LAB: INR: 2.8

## 2010-05-23 ENCOUNTER — Ambulatory Visit
Admission: RE | Admit: 2010-05-23 | Discharge: 2010-05-23 | Payer: Self-pay | Source: Home / Self Care | Attending: Family Medicine | Admitting: Family Medicine

## 2010-05-26 ENCOUNTER — Telehealth: Payer: Self-pay | Admitting: Family Medicine

## 2010-05-27 ENCOUNTER — Telehealth: Payer: Self-pay | Admitting: Family Medicine

## 2010-05-29 ENCOUNTER — Ambulatory Visit (INDEPENDENT_AMBULATORY_CARE_PROVIDER_SITE_OTHER): Payer: 59 | Admitting: Family Medicine

## 2010-05-29 ENCOUNTER — Encounter: Payer: Self-pay | Admitting: Family Medicine

## 2010-05-29 DIAGNOSIS — I4891 Unspecified atrial fibrillation: Secondary | ICD-10-CM

## 2010-05-29 DIAGNOSIS — Z5181 Encounter for therapeutic drug level monitoring: Secondary | ICD-10-CM

## 2010-05-29 DIAGNOSIS — R059 Cough, unspecified: Secondary | ICD-10-CM

## 2010-05-29 DIAGNOSIS — Z7901 Long term (current) use of anticoagulants: Secondary | ICD-10-CM

## 2010-05-29 DIAGNOSIS — R05 Cough: Secondary | ICD-10-CM

## 2010-05-29 NOTE — Progress Notes (Signed)
Summary: Dizzy, sinus congestion  Phone Note Call from Patient Call back at Home Phone [phone]   Caller: Patient Call For: Kerby Nora MD Summary of Call: Patient says she is very dizzy and has sinus congestion and pressure near her eyes.  She is a little nauseated as well.  No fever, no ST, maybe a little drainage down her throat.  Please advise what she can do.  [facility] [address]   Initial call taken by: [name] CMA Duncan Dull),  October 21, 2009 12:53 PM  Follow-up for Phone Call        discuss basic cold care with patient.  on coumadin, ok to take tessalon perles that sent in for cough. Follow-up by: Hannah Beat MD,  October 21, 2009 12:58 PM  Additional Follow-up for Phone Call Additional follow up Details #1::        patient says she is not coughing at all she says she has alot of congestion or drainage. Patient advised to take some regular mucinex and if not better in a few days give a [place] a call back .Consuello Masse CMA   Additional Follow-up by: Benny Lennert CMA Duncan Dull),  October 21, 2009 2:22 PM    Additional Follow-up for Phone Call Additional follow up Details #2::    yes Follow-up by: Hannah Beat MD,  October 21, 2009 2:26 PM  New/Updated Medications: TESSALON PERLES 100 MG  CAPS (BENZONATATE) 1 by mouth three times a day as needed cough Prescriptions: TESSALON PERLES 100 MG  CAPS (BENZONATATE) 1 by mouth three times a day as needed cough  #40 x 0   Entered and Authorized by:   Hannah Beat MD   Signed by:   Hannah Beat MD on 10/21/2009   Method used:   [facility]        [facility]  [address] [phone][facility] (retail)       [address]       [place], [place]  [id number]       Ph: [phone] or [phone]       Fax: [phone]   RxID:   [id number]

## 2010-05-29 NOTE — Assessment & Plan Note (Signed)
Summary: F/U AFTER LABS / LFW   Vital Signs:  Patient profile:   77 year old female Weight:      233.25 pounds BMI:     38.96 Temp:     98.1 degrees F oral Pulse rate:   64 / minute Pulse rhythm:   regular BP sitting:   120 / 72  (left arm) Cuff size:   large  Vitals Entered By: Linde Gillis CMA Duncan Dull) (June 04, 2009 8:42 AM) CC: 3 month follow up after labs, Hypertension Management   History of Present Illness: Bronchitis, SOB improving but continued post nasal drip...constant.   No sneeze, no ithcy eyes.  NASal steorid, Claritin, zyrtec..minimal help in past.   DM, well controlled.  Working some on [employer] Can go to [employer] for free.  5 lbs loss over holiday.    Continues to feel tired all day..does not snotre at night..never told apnea Has had negative work up.Labeval.. No chest pain except heaviness and fullness where hiatal hernia is.  Sees Dr. Daleen Squibb cards. Wakes up with urinating at night...takes furosemide at 4 PM!!!    Hypertension History:      well controlled on current meds. .        Positive major cardiovascular risk factors include female age 40 years old or older, hyperlipidemia, and hypertension.  Negative major cardiovascular risk factors include non-tobacco-user status.        Positive history for target organ damage include cardiac end organ damage (either CHF or LVH).     Problems Prior to Update: 1)  Acute Bronchitis  (ICD-466.0) 2)  Fatigue  (ICD-780.79) 3)  Unspecified Vitamin D Deficiency  (ICD-268.9) 4)  Foot Pain, Left  (ICD-729.5) 5)  Low Back Pain, Chronic  (ICD-724.2) 6)  Overweight/obesity  (ICD-278.02) 7)  Cardiomyopathy, Secondary  (ICD-425.9) 8)  Atrial Flutter  (ICD-427.32) 9)  Atrial Fibrillation  (ICD-427.31) 10)  Congestive Heart Failure  (ICD-428.0) 11)  Peripheral Edema  (ICD-782.3) 12)  Hyperlipidemia  (ICD-272.4) 13)  Hypertension  (ICD-401.9) 14)  Cough  (ICD-786.2) 15)  Allergic Rhinitis   (ICD-477.9) 16)  Colonic Polyps, Hyperplastic  (ICD-211.3) 17)  Diverticulosis, Colon  (ICD-562.10) 18)  Benign Neoplasm Oth&unspec Site Digestive System  (ICD-211.9) 19)  Fatigue  (ICD-780.79) 20)  Stricture, Esophageal  (ICD-530.3) 21)  Diabetes Mellitus, Type II, Borderline  (ICD-790.29) 22)  Gerd  (ICD-530.81) 23)  Osteoarthritis  (ICD-715.90) 24)  Knee Pain, Right, Chronic  (ICD-719.46) 25)  Herniated Lumbar Disc  (ICD-722.10) 26)  Hiatal Hernia  (ICD-553.3) 27)  Gynecological Examination, Routine  (ICD-V72.31) 28)  Osteoporosis  (ICD-733.00) 29)  Screening Mammogram Nec  (ICD-V76.12) 30)  Encounter For Therapeutic Drug Monitoring  (ICD-V58.83) 31)  Anticoagulation Therapy  (ICD-V58.61)  Current Medications (verified): 1)  Cartia Xt 300 Mg  Cp24 (Diltiazem Hcl Coated Beads) .... Take 1 Tablet By Mouth Once A Day 2)  Lisinopril 40 Mg  Tabs (Lisinopril) .... Take 1 Tablet By Mouth Once A Day 3)  Furosemide 80 Mg Tabs (Furosemide) .... Take 1 Tablet By Mouth Once A Day 4)  Warfarin Sodium 5 Mg  Tabs (Warfarin Sodium) .... Take 1 Tablet By Mouth Once A Day As Directed. 5)  Nexium 40 Mg Cpdr (Esomeprazole Magnesium) .... Take 1 Tablet By Mouth Once A Day 6)  Alendronate Sodium 70 Mg  Tabs (Alendronate Sodium) .... Take 1 Tablet  By Mouth Each Week. 7)  Simvastatin 80 Mg  Tabs (Simvastatin) .... Take 1 Tablet By Mouth Once  A Day 8)  Metoprolol Succinate 200 Mg Xr24h-Tab (Metoprolol Succinate) .... Take 1 Tablet By Mouth Once A Day 9)  Cheratussin Ac 100-10 Mg/63ml Syrp (Guaifenesin-Codeine) .Marland Kitchen.. 1-2 Tsp By Mouth At Bedtime As Needed Cough 10)  Singulair 10 Mg Tabs (Montelukast Sodium) .Marland Kitchen.. 1 Tab By Mouth At Bedtime  Allergies (verified): No Known Drug Allergies  Past History:  Past medical, surgical, family and social histories (including risk factors) reviewed, and no changes noted (except as noted below).  Past Medical History: Reviewed history from 10/02/2008 and no changes  required. OVERWEIGHT/OBESITY (ICD-278.02) CARDIOMYOPATHY, SECONDARY (ICD-425.9) ATRIAL FLUTTER (ICD-427.32) ATRIAL FIBRILLATION (ICD-427.31) CONGESTIVE HEART FAILURE (ICD-428.0) PERIPHERAL EDEMA (ICD-782.3) HYPERLIPIDEMIA (ICD-272.4) HYPERTENSION (ICD-401.9) COUGH (ICD-786.2) ALLERGIC RHINITIS (ICD-477.9) URI (ICD-465.9) OTHER DYSPHAGIA (ICD-787.29) COLONIC POLYPS, HYPERPLASTIC (ICD-211.3) DIVERTICULOSIS, COLON (ICD-562.10) BENIGN NEOPLASM OTH&UNSPEC SITE DIGESTIVE SYSTEM (ICD-211.9) FATIGUE (ICD-780.79) STRICTURE, ESOPHAGEAL (ICD-530.3) PNEUMONIA, BACTERIAL NOS (ICD-482.9) SYMPTOM, COUGH (ICD-786.2) PREDIABETES (ICD-790.29) GERD (ICD-530.81) OSTEOARTHRITIS (ICD-715.90) KNEE PAIN, RIGHT, CHRONIC (ICD-719.46) HERNIATED LUMBAR DISC (ICD-722.10) HIATAL HERNIA (ICD-553.3) ANTICOAGULATION THERAPY (ICD-V58.61)  Past Surgical History: Reviewed history from 07/11/2007 and no changes required. esophogeal stricture dilation 01/2007 Cholesystectomy 04 Ankle surgery 2001 Partial hysterectomy 1971  Family History: Reviewed history from 12/16/2006 and no changes required. father age 56s emphesema mother age 53 CAD,HTN daughter: brain cyst 2 sisters chol 2 brothers healthy 2 of 5 daughter with DM, HTN daughter with ovarian cancer 1 son with bone marrow cancer  Social History: Reviewed history from 12/16/2006 and no changes required. 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 CV:  Denies chest pain or discomfort. Resp:  Denies shortness of breath. GI:  Denies abdominal pain, bloody stools, constipation, and diarrhea. GU:  Denies dysuria.  Physical Exam  General:  obese appearing elderly female in NAD Head:  no maxillary sinus ttp Nose:  nasal discharge, minimal Mouth:  MMM, post nasal drip Neck:  no carotid bruit or  thyromegaly no cervical or supraclavicular lymphadenopathy  Lungs:  Normal respiratory effort, chest expands symmetrically. Lungs are clear to auscultation, no crackles or wheezes. Heart:  Normal rate and regular rhythm. S1 and S2 normal without gallop, murmur, click, rub or other extra sounds. Pulses:  R and L posterior tibial pulses are full and equal bilaterally  Extremities:  1+ left pedal edema and 1+ right pedal edema.   Skin:  Intact without suspicious lesions or rashes Psych:  Cognition and judgment appear intact. Alert and cooperative with normal attention span and concentration. No apparent delusions, illusions, hallucinations  Diabetes Management Exam:    Foot Exam (with socks and/or shoes not present):       Sensory-Pinprick/Light touch:          Left medial foot (L-4): normal          Left dorsal foot (L-5): normal          Left lateral foot (S-1): normal          Right medial foot (L-4): normal          Right dorsal foot (L-5): normal          Right lateral foot (S-1): normal       Sensory-Monofilament:          Left foot: normal          Right foot: normal       Inspection:  Left foot: normal          Right foot: normal       Nails:          Left foot: normal          Right foot: normal   Impression & Recommendations:  Problem # 1:  HYPERTENSION (ICD-401.9) Well controlle don current meds Her updated medication list for this problem includes:    Cartia Xt 300 Mg Cp24 (Diltiazem hcl coated beads) .Marland Kitchen... Take 1 tablet by mouth once a day    Lisinopril 40 Mg Tabs (Lisinopril) .Marland Kitchen... Take 1 tablet by mouth once a day    Furosemide 80 Mg Tabs (Furosemide) .Marland Kitchen... Take 1 tablet by mouth once a day    Metoprolol Succinate 200 Mg Xr24h-tab (Metoprolol succinate) .Marland Kitchen... Take 1 tablet by mouth once a day  Problem # 2:  CONGESTIVE HEART FAILURE (ICD-428.0) Stable... some fluid overload..taking furosemide at evening!! Likely interupting sleep and causing fatigue during  day.  Her updated medication list for this problem includes:    Lisinopril 40 Mg Tabs (Lisinopril) .Marland Kitchen... Take 1 tablet by mouth once a day    Furosemide 80 Mg Tabs (Furosemide) .Marland Kitchen... Take 1 tablet by mouth once a day    Warfarin Sodium 5 Mg Tabs (Warfarin sodium) .Marland Kitchen... Take 1 tablet by mouth once a day as directed.    Metoprolol Succinate 200 Mg Xr24h-tab (Metoprolol succinate) .Marland Kitchen... Take 1 tablet by mouth once a day  Problem # 3:  FATIGUE (ICD-780.79) Multipfactorial..make changes to med regimen to take furosemide in day. MAy try B12 supplement in AMs. Encouraged exercise, weight loss, healthy eating habits.   Problem # 4:  DIABETES MELLITUS, TYPE II, BORDERLINE (ICD-790.29) Well controlled. Continue current medication.   Problem # 5:  ALLERGIC RHINITIS (ICD-477.9) Constant PND...like aallergoic contribution..will try trial of singulair. No current sign or infection.   Complete Medication List: 1)  Cartia Xt 300 Mg Cp24 (Diltiazem hcl coated beads) .... Take 1 tablet by mouth once a day 2)  Lisinopril 40 Mg Tabs (Lisinopril) .... Take 1 tablet by mouth once a day 3)  Furosemide 80 Mg Tabs (Furosemide) .... Take 1 tablet by mouth once a day 4)  Warfarin Sodium 5 Mg Tabs (Warfarin sodium) .... Take 1 tablet by mouth once a day as directed. 5)  Nexium 40 Mg Cpdr (Esomeprazole magnesium) .... Take 1 tablet by mouth once a day 6)  Alendronate Sodium 70 Mg Tabs (Alendronate sodium) .... Take 1 tablet  by mouth each week. 7)  Simvastatin 80 Mg Tabs (Simvastatin) .... Take 1 tablet by mouth once a day 8)  Metoprolol Succinate 200 Mg Xr24h-tab (Metoprolol succinate) .... Take 1 tablet by mouth once a day 9)  Cheratussin Ac 100-10 Mg/61ml Syrp (Guaifenesin-codeine) .Marland Kitchen.. 1-2 tsp by mouth at bedtime as needed cough 10)  Singulair 10 Mg Tabs (Montelukast sodium) .Marland Kitchen.. 1 tab by mouth at bedtime  Hypertension Assessment/Plan:      The patient's hypertensive risk group is category C: Target organ damage  and/or diabetes.  Her calculated 10 year risk of coronary heart disease is 7 %.  Today's blood pressure is 120/72.  Her blood pressure goal is < 140/90.  Patient Instructions: 1)  Calcium and vit D 600 mg/400 International Units two times a day....caltrate or viactiv. 2)  Start taking furosemide in AM.  3)  Start singulair at bedtime. 4)  Take B12 1000 micrograms in morning. 5)  Please schedule a follow-up appointment in 3  months .  6)  HgBA1c prior to visit  ICD-9: 250.00 7)  BMP prior to visit, ICD-9:  8)  Hepatic Panel prior to visit ICD-9:  9)  Lipid panel prior to visit ICD-9 :  10)  Urine Microalbumin prior to visit ICD-9 :   Current Allergies (reviewed today): No known allergies

## 2010-05-29 NOTE — Letter (Signed)
Summary: [facility] Office Visit Note   [facility] Office Visit Note   Imported By: Roderic Ovens 02/28/2010 16:19:58  _____________________________________________________________________  External Attachment:    Type:   Image     Comment:   External Document

## 2010-05-29 NOTE — Assessment & Plan Note (Signed)
Summary: cpx/dlo   Vital Signs:  Patient profile:   77 year old female Height:      64 inches Weight:      233.0 pounds BMI:     40.14 Temp:     97.8 degrees F oral Pulse rate:   76 / minute Pulse rhythm:   regular BP sitting:   130 / 80  (left arm) Cuff size:   large  Vitals Entered By: Benny Lennert CMA Duncan Dull) (March 05, 2010 11:27 AM)  History of Present Illness: Chief complaint Chronic health issue management  DM, well controlled  with diet  HTN,well controlled on cartia, lisinopril, metoprolol    High cholesterol, well controll LDL at goal on lipitor 80mg  daily   Chronic atrial fibrillation with rate controlled on metoprolol and diltiazem , last OV with Dr. Mariah Milling 08/2009  Reviewed OV not e and last ECHO.   Chronic low back pain..referred to [facility]. Dr. Ethelene Hal.  Recommended injection in hip or spine..she is hesitant about coming off coumadin  Referred to PT..follow up in 4 weeks scheduled.  GERD...cannot pay for nexium.Marland Kitchenso she is on omeprazole.   No dysphagia currently.  2009 Had esophageal stricture dilated.   Problems Prior to Update: 1)  Asthma, Persistent, Moderate  (ICD-493.90) 2)  Fatigue, Chronic  (ICD-780.79) 3)  Unspecified Vitamin D Deficiency  (ICD-268.9) 4)  Foot Pain, Left  (ICD-729.5) 5)  Low Back Pain, Chronic  (ICD-724.2) 6)  Overweight/obesity  (ICD-278.02) 7)  Cardiomyopathy, Secondary  (ICD-425.9) 8)  Atrial Fibrillation  (ICD-427.31) 9)  Congestive Heart Failure  (ICD-428.0) 10)  Peripheral Edema  (ICD-782.3) 11)  Hyperlipidemia  (ICD-272.4) 12)  Hypertension  (ICD-401.9) 13)  Cough  (ICD-786.2) 14)  Allergic Rhinitis  (ICD-477.9) 15)  Colonic Polyps, Hyperplastic  (ICD-211.3) 16)  Diverticulosis, Colon  (ICD-562.10) 17)  Benign Neoplasm Oth&unspec Site Digestive System  (ICD-211.9) 18)  Stricture, Esophageal  (ICD-530.3) 19)  Diabetes Mellitus, Type II, Borderline  (ICD-790.29) 20)  Gerd  (ICD-530.81) 21)   Osteoarthritis  (ICD-715.90) 22)  Knee Pain, Right, Chronic  (ICD-719.46) 23)  Herniated Lumbar Disc  (ICD-722.10) 24)  Hiatal Hernia  (ICD-553.3) 25)  Gynecological Examination, Routine  (ICD-V72.31) 26)  Osteoporosis  (ICD-733.00) 27)  Screening Mammogram Nec  (ICD-V76.12) 28)  Encounter For Therapeutic Drug Monitoring  (ICD-V58.83) 29)  Anticoagulation Therapy  (ICD-V58.61)  Current Medications (verified): 1)  Cartia Xt 300 Mg  Cp24 (Diltiazem Hcl Coated Beads) .... Take 1 Tablet By Mouth Once A Day 2)  Lisinopril 40 Mg  Tabs (Lisinopril) .... Take 1 Tablet By Mouth Once A Day 3)  Furosemide 80 Mg Tabs (Furosemide) .... Take 1 Tablet By Mouth Once A Day 4)  Warfarin Sodium 5 Mg  Tabs (Warfarin Sodium) .... Take 1 Tablet By Mouth Once A Day As Directed. 5)  Prilosec 20 Mg Cpdr (Omeprazole) .... 2 Tab  Po Daily 6)  Alendronate Sodium 70 Mg  Tabs (Alendronate Sodium) .... Take 1 Tablet  By Mouth Each Week. 7)  Lipitor 80 Mg Tabs (Atorvastatin Calcium) .... Take 1 Tablet By Mouth Once A Day 8)  Metoprolol Succinate 200 Mg Xr24h-Tab (Metoprolol Succinate) .... Take 1 Tablet By Mouth Once A Day 9)  Proair Hfa 108 (90 Base) Mcg/act  Aers (Albuterol Sulfate) .... 2 Inh Q4h As Needed Shortness of Breath 10)  Advair Diskus 250-50 Mcg/dose Aepb (Fluticasone-Salmeterol) .Marland Kitchen.. 1 Inh Two Times A Day 11)  Fluorometholone 0.1 % Susp (Fluorometholone) .... As Needed 12)  Tessalon Perles 100  Mg  Caps (Benzonatate) .Marland Kitchen.. 1 By Mouth Three Times A Day As Needed Cough 13)  Allegra Allergy 60 Mg Tabs (Fexofenadine Hcl) .Marland Kitchen.. 1 Tab By Mouth Two Times A Day As Needed Allergies 14)  Hydrocodone-Acetaminophen 5-500 Mg Tabs (Hydrocodone-Acetaminophen) .Marland Kitchen.. 1 Tab By Mouth Daily As Needed Pain  Allergies (verified): No Known Drug Allergies  Past History:  Past medical, surgical, family and social histories (including risk factors) reviewed, and no changes noted (except as noted below).  Past Medical  History: Reviewed history from 10/02/2008 and no changes required. OVERWEIGHT/OBESITY (ICD-278.02) CARDIOMYOPATHY, SECONDARY (ICD-425.9) ATRIAL FLUTTER (ICD-427.32) ATRIAL FIBRILLATION (ICD-427.31) CONGESTIVE HEART FAILURE (ICD-428.0) PERIPHERAL EDEMA (ICD-782.3) HYPERLIPIDEMIA (ICD-272.4) HYPERTENSION (ICD-401.9) COUGH (ICD-786.2) ALLERGIC RHINITIS (ICD-477.9) URI (ICD-465.9) OTHER DYSPHAGIA (ICD-787.29) COLONIC POLYPS, HYPERPLASTIC (ICD-211.3) DIVERTICULOSIS, COLON (ICD-562.10) BENIGN NEOPLASM OTH&UNSPEC SITE DIGESTIVE SYSTEM (ICD-211.9) FATIGUE (ICD-780.79) STRICTURE, ESOPHAGEAL (ICD-530.3) PNEUMONIA, BACTERIAL NOS (ICD-482.9) SYMPTOM, COUGH (ICD-786.2) PREDIABETES (ICD-790.29) GERD (ICD-530.81) OSTEOARTHRITIS (ICD-715.90) KNEE PAIN, RIGHT, CHRONIC (ICD-719.46) HERNIATED LUMBAR DISC (ICD-722.10) HIATAL HERNIA (ICD-553.3) ANTICOAGULATION THERAPY (ICD-V58.61)  Past Surgical History: Reviewed history from 07/11/2007 and no changes required. esophogeal stricture dilation 01/2007 Cholesystectomy 04 Ankle surgery 2001 Partial hysterectomy 1971  Family History: Reviewed history from 12/16/2006 and no changes required. father age 77s emphesema mother age 83 CAD,HTN daughter: brain cyst 2 sisters chol 2 brothers healthy 2 of 5 daughter with DM, HTN daughter with ovarian cancer 1 son with bone marrow cancer  Social History: Reviewed history from 12/16/2006 and no changes required. 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 CV:  Denies chest pain or discomfort. Resp:  Denies cough, sputum productive, and wheezing; URI symptoms improved. Marland Kitchen GI:  Denies abdominal pain. GU:  Denies dysuria.  Physical Exam  General:  obese appearing elderly female in NAD Head:  no maxillary sinus ttp Eyes:  No corneal  or conjunctival inflammation noted. EOMI. Perrla. Funduscopic exam benign, without hemorrhages, exudates or papilledema. Vision grossly normal. Ears:  External ear exam shows no significant lesions or deformities.  Otoscopic examination reveals clear canals, tympanic membranes are intact bilaterally without bulging, retraction, inflammation or discharge. Hearing is grossly normal bilaterally. Nose:  External nasal examination shows no deformity or inflammation. Nasal mucosa are pink and moist without lesions or exudates. Mouth:  Oral mucosa and oropharynx without lesions or exudates.  Teeth in good repair. Neck:  no carotid bruit or thyromegaly no cervical or supraclavicular lymphadenopathy  Lungs:  Normal respiratory effort, chest expands symmetrically. Lungs are clear to auscultation, no crackles or wheezes. Heart:  irr rate and regular rhythm. S1 and S2 normal without gallop, murmur, click, rub or other extra sounds. Abdomen:  Bowel sounds positive,abdomen soft and non-tender without masses, organomegaly or hernias noted. obese abd no renal bruits  Pulses:  R and L posterior tibial pulses are full and equal bilaterally  Extremities:  no edema Skin:  Intact without suspicious lesions or rashes Psych:  Oriented X3, memory intact for recent and remote, and subdued.     Impression & Recommendations:  Problem # 1:  HYPERTENSION (ICD-401.9)  Well controlled. Continue current medication.  Her updated medication list for this problem includes:    Cartia Xt 300 Mg Cp24 (Diltiazem hcl coated beads) .Marland Kitchen... Take 1 tablet by mouth once a day    Lisinopril 40 Mg Tabs (Lisinopril) .Marland Kitchen... Take 1 tablet by mouth once a day  Furosemide 80 Mg Tabs (Furosemide) .Marland Kitchen... Take 1 tablet by mouth once a day    Metoprolol Succinate 200 Mg Xr24h-tab (Metoprolol succinate) .Marland Kitchen... Take 1 tablet by mouth once a day  BP today: 130/80 Prior BP: 130/80 (01/31/2010)  Prior 10 Yr Risk Heart Disease: 7 %  (06/04/2009)  Labs Reviewed: K+: 4.0 (02/26/2010) Creat: : 0.8 (02/26/2010)   Chol: 168 (02/26/2010)   HDL: 49.00 (02/26/2010)   LDL: 103 (02/26/2010)   TG: 78.0 (02/26/2010)  Problem # 2:  HYPERLIPIDEMIA (ICD-272.4) Well controlled. Continue current medication.  Her updated medication list for this problem includes:    Lipitor 80 Mg Tabs (Atorvastatin calcium) .Marland Kitchen... Take 1 tablet by mouth once a day  Labs Reviewed: SGOT: 15 (02/26/2010)   SGPT: 17 (02/26/2010)  Prior 10 Yr Risk Heart Disease: 7 % (06/04/2009)   HDL:49.00 (02/26/2010), 53.90 (02/19/2009)  LDL:103 (02/26/2010), 90 (14/78/2956)  Chol:168 (02/26/2010), 160 (02/19/2009)  Trig:78.0 (02/26/2010), 79.0 (02/19/2009)  Problem # 3:  DIABETES MELLITUS, TYPE II, BORDERLINE (ICD-790.29) Well controlled with diet.  Labs Reviewed: Creat: 0.8 (02/26/2010)     Problem # 4:  GERD (ICD-530.81) COntinue omeprazole.  Her updated medication list for this problem includes:    Prilosec 20 Mg Cpdr (Omeprazole) .Marland Kitchen... 2 tab  po daily  Problem # 5:  LOW BACK PAIN, CHRONIC (ICD-724.2) Recommended moving ahead with injection per Dr. Ethelene Hal as well as PT. She will feel stronger if she works on increasing exercsie and weight loss.  Her updated medication list for this problem includes:    Hydrocodone-acetaminophen 5-500 Mg Tabs (Hydrocodone-acetaminophen) .Marland Kitchen... 1 tab by mouth daily as needed pain  Problem # 6:  ATRIAL FIBRILLATION (ICD-427.31) Stable per cards.  Her updated medication list for this problem includes:    Cartia Xt 300 Mg Cp24 (Diltiazem hcl coated beads) .Marland Kitchen... Take 1 tablet by mouth once a day    Warfarin Sodium 5 Mg Tabs (Warfarin sodium) .Marland Kitchen... Take 1 tablet by mouth once a day as directed.    Metoprolol Succinate 200 Mg Xr24h-tab (Metoprolol succinate) .Marland Kitchen... Take 1 tablet by mouth once a day  Complete Medication List: 1)  Cartia Xt 300 Mg Cp24 (Diltiazem hcl coated beads) .... Take 1 tablet by mouth once a day 2)  Lisinopril  40 Mg Tabs (Lisinopril) .... Take 1 tablet by mouth once a day 3)  Furosemide 80 Mg Tabs (Furosemide) .... Take 1 tablet by mouth once a day 4)  Warfarin Sodium 5 Mg Tabs (Warfarin sodium) .... Take 1 tablet by mouth once a day as directed. 5)  Prilosec 20 Mg Cpdr (Omeprazole) .... 2 tab  po daily 6)  Alendronate Sodium 70 Mg Tabs (Alendronate sodium) .... Take 1 tablet  by mouth each week. 7)  Lipitor 80 Mg Tabs (Atorvastatin calcium) .... Take 1 tablet by mouth once a day 8)  Metoprolol Succinate 200 Mg Xr24h-tab (Metoprolol succinate) .... Take 1 tablet by mouth once a day 9)  Proair Hfa 108 (90 Base) Mcg/act Aers (Albuterol sulfate) .... 2 inh q4h as needed shortness of breath 10)  Advair Diskus 250-50 Mcg/dose Aepb (Fluticasone-salmeterol) .Marland Kitchen.. 1 inh two times a day 11)  Fluorometholone 0.1 % Susp (Fluorometholone) .... As needed 12)  Tessalon Perles 100 Mg Caps (Benzonatate) .Marland Kitchen.. 1 by mouth three times a day as needed cough 13)  Allegra Allergy 60 Mg Tabs (Fexofenadine hcl) .Marland Kitchen.. 1 tab by mouth two times a day as needed allergies 14)  Hydrocodone-acetaminophen 5-500 Mg Tabs (Hydrocodone-acetaminophen) .Marland Kitchen.. 1 tab by  mouth daily as needed pain  Other Orders: Flu Vaccine 23yrs + MEDICARE PATIENTS (Z6109) Administration Flu vaccine - MCR (U0454) Radiology Referral (Radiology)  Patient Instructions: 1)  Referral Appointment Information 2)  Day/Date: 3)  Time: 4)  Place/MD: 5)  Address: 6)  Phone/Fax: 7)  Patient given appointment information. Information/Orders faxed/mailed.  8)  Please schedule a follow-up appointment in 6 months  30 min 9)  BMP prior to visit, ICD-9: 250.00 10)  Hepatic Panel prior to visit ICD-9:  11)  Lipid panel prior to visit ICD-9 :  12)  HgBA1c prior to visit  ICD-9:  Prescriptions: ALLEGRA ALLERGY 60 MG TABS (FEXOFENADINE HCL) 1 tab by mouth two times a day as needed allergies  #60 x 4   Entered and Authorized by:   Kerby Nora MD   Signed by:   Kerby Nora  MD on 03/05/2010   Method used:   Elect[facility] to        [facility] [phone]* (retail)       [address]       [place], [place]  [id number]       Ph: [phone] or [phone]       Fax: [phone]   RxID:   [id number] TESSALON PERLES 100 MG  CAPS (BENZONATATE) 1 by mouth three times a day as needed cough  #40 x 4   Entered and Authorized by:   Kerby Nora MD   Signed by:   Kerby Nora MD on 03/05/2010   Method used:   [facility]        [facility]  [address] [phone][facility] (retail)       [address]       [place], [place]  [id number]       Ph: [phone] or [phone]       Fax: [phone]   RxID:   [id number]    Orders Added: 1)  Flu Vaccine 10yrs + MEDICARE PATIENTS [Q2039] 2)  Administration Flu vaccine - MCR [G0008] 3)  Radiology Referral [Radiology] 4)  Est. Patient Level IV [60737]    Current Allergies (reviewed today): No known allergies  Last Flu Vaccine:  Fluvax 3+ (01/31/2009 8:20:28 AM) Flu Vaccine Result Date:  03/05/2010 Flu Vaccine Result:  given Flu Vaccine Next Due:  1 yr Herpes Zoster Next Due:  Refused Daughter with ovarian cancer               Flu Vaccine Consent Questions     Do you have a history of severe allergic reactions to this vaccine? no    Any prior history of allergic reactions to egg and/or gelatin? no    Do you have a sensitivity to the preservative Thimersol? no    Do you have a past history of Guillan-Barre Syndrome? no    Do you currently have an acute febrile illness? no    Have you ever had a severe reaction to latex? no    Vaccine information given and explained to patient? yes    Are you currently pregnant? no    Lot Number:[id number]   Exp Date:10/25/2010   Site Given  Left Deltoid [id number]

## 2010-05-29 NOTE — Progress Notes (Signed)
Summary: ok to take aleve?  Phone Note Call from Patient Call back at Work Phone [phone]   Caller: Patient Summary of Call: Pt states singulair is not helping her allergies, she is asking what else she can try with her coumadin.  Also asks if ok to take aleve with the coumadin.  She says tylenol is not helping back and foot pain. Initial call taken by: Lowella Petties CMA,  July 18, 2009 11:11 AM  Follow-up for Phone Call        alleve = absolutely not. Tylenol OK  complex question in elderly -- I will get Dr. Senaida Lange input. Follow-up by: Hannah Beat MD,  July 18, 2009 12:08 PM  Additional Follow-up for Phone Call Additional follow up Details #1::        I agree no aleve. We can try vicodin in limited fashion as she has not gotten relief from tramadol in past.  For allergies and chronic cough ...singulair takes 1 month to work..she needs to continue for at least that length . Also has she started Advair? . If no improvement we will refer her to an allergist.   Additional Follow-up by: Kerby Nora MD,  July 19, 2009 8:21 AM    Additional Follow-up for Phone Call Additional follow up Details #2::    Patient would like to try vicodin and singular needs them send to pharamcy Follow-up by: Benny Lennert CMA Duncan Dull),  July 19, 2009 8:38 AM  Additional Follow-up for Phone Call Additional follow up Details #3:: Details for Additional Follow-up Action Taken: rx called to pharmacy Additional Follow-up by: Benny Lennert CMA Duncan Dull),  July 19, 2009 8:46 AM  New/Updated Medications: HYDROCODONE-ACETAMINOPHEN 5-500 MG TABS (HYDROCODONE-ACETAMINOPHEN) 1 tab by mouth two times a day as needed pain, limit use for severe pain Prescriptions: SINGULAIR 10 MG TABS (MONTELUKAST SODIUM) 1 tab by mouth at bedtime  #30 x 3   Entered and Authorized by:   Kerby Nora MD   Signed by:   Kerby Nora MD on 07/19/2009   Method used:   [facility]ph[facility] to ...       [id number] ([facility]      [address]       [place], [place]  [address]       Ph: [phone] or [phone]       Fax: [phone]   RxID:   [id number] HYDROCODONE-ACETAMINOPHEN 5-500 MG TABS (HYDROCODONE-ACETAMINOPHEN) 1 tab by mouth two times a day as needed pain, limit use for severe pain  #60 x 0   Entered and Authorized by:   Kerby Nora MD   Signed by:   Kerby Nora MD on 07/19/2009   Method used:   Telephoned to ...       [id number] ([facility]ail[facility]       [address]       [place], [place]  [address]       Ph: [phone] or [phone]       Fax: [phone]   RxID:   [id number]

## 2010-05-29 NOTE — Letter (Signed)
Summary: [facility]  [facility]   Imported By: Lanelle Bal 02/25/2010 14:48:21  _____________________________________________________________________  External Attachment:    Type:   Image     Comment:   External Document

## 2010-05-29 NOTE — Assessment & Plan Note (Signed)
Summary: F1M/AMD  Medications Added PREVACID 24HR 15 MG CPDR (LANSOPRAZOLE) one tablet once daily CLONIDINE HCL 0.2 MG TABS (CLONIDINE HCL) Take one tablet by mouth twice a day DIGOXIN 0.125 MG TABS (DIGOXIN) Take one tablet by mouth daily      Allergies Added: NKDA  Visit Type:  Follow-up Primary Provider:  Kerby Nora MD  CC:  c/o hernia bothering her really bad; has difficulty breathing lying down and concerned about the A-fib.Angela Ali  History of Present Illness: Angela Ali is a very pleasant 77 year old woman with a history of chronic atrial fibrillation, on warfarin, hypertension, chronic lower extremity edema, shortness of breath, who presents for routine followup.  On her last clinic visit, we held her diltiazem and started clonidine for her lower extremity edema and for hypertension control. Holding the diltiazem, she thinks that her edema has significantly improved. She does have a fullness in her abdomen and believes it could be her hiatal hernia. This seems to worse with certain positions also comes on when she exerts herself.  No episodes of chest pain, lightheadedness or dizziness.  she states having an echocardiogram over 10 years ago though nothing recently.  EKG shows atrial fibrillation with ventricular rate of 87 beats per minute, no significant ST or T wave changes, poor R-wave progression through the precordial leads  Current Medications (verified): 1)  Lisinopril 40 Mg  Tabs (Lisinopril) .... Take 1 Tablet By Mouth Once A Day 2)  Furosemide 80 Mg Tabs (Furosemide) .... Take 1 Tablet By Mouth Once A Day 3)  Warfarin Sodium 5 Mg  Tabs (Warfarin Sodium) .... Take 1 Tablet By Mouth Once A Day As Directed. 4)  Prevacid 24hr 15 Mg Cpdr (Lansoprazole) .... One Tablet Once Daily 5)  Alendronate Sodium 70 Mg  Tabs (Alendronate Sodium) .... Take 1 Tablet  By Mouth Each Week. 6)  Lipitor 80 Mg Tabs (Atorvastatin Calcium) .... Take 1 Tablet By Mouth Once A Day 7)  Metoprolol  Succinate 200 Mg Xr24h-Tab (Metoprolol Succinate) .... Take 1 Tablet By Mouth Once A Day 8)  Proair Hfa 108 (90 Base) Mcg/act  Aers (Albuterol Sulfate) .... 2 Inh Q4h As Needed Shortness of Breath 9)  Advair Diskus 250-50 Mcg/dose Aepb (Fluticasone-Salmeterol) .Angela Ali.. 1 Inh Two Times A Day 10)  Fluorometholone 0.1 % Susp (Fluorometholone) .... As Needed 11)  Tessalon Perles 100 Mg  Caps (Benzonatate) .Angela Ali.. 1 By Mouth Three Times A Day As Needed Cough 12)  Allegra Allergy 60 Mg Tabs (Fexofenadine Hcl) .Angela Ali.. 1 Tab By Mouth Two Times A Day As Needed Allergies 13)  Hydrocodone-Acetaminophen 5-500 Mg Tabs (Hydrocodone-Acetaminophen) .Angela Ali.. 1 Tab By Mouth Daily As Needed Pain 14)  Clonidine Hcl 0.1 Mg Tabs (Clonidine Hcl) .... Take 1 Tablet By Mouth Two Times A Day  Allergies (verified): No Known Drug Allergies  Past History:  Past Medical History: Last updated: 10/02/2008 OVERWEIGHT/OBESITY (ICD-278.02) CARDIOMYOPATHY, SECONDARY (ICD-425.9) ATRIAL FLUTTER (ICD-427.32) ATRIAL FIBRILLATION (ICD-427.31) CONGESTIVE HEART FAILURE (ICD-428.0) PERIPHERAL EDEMA (ICD-782.3) HYPERLIPIDEMIA (ICD-272.4) HYPERTENSION (ICD-401.9) COUGH (ICD-786.2) ALLERGIC RHINITIS (ICD-477.9) URI (ICD-465.9) OTHER DYSPHAGIA (ICD-787.29) COLONIC POLYPS, HYPERPLASTIC (ICD-211.3) DIVERTICULOSIS, COLON (ICD-562.10) BENIGN NEOPLASM OTH&UNSPEC SITE DIGESTIVE SYSTEM (ICD-211.9) FATIGUE (ICD-780.79) STRICTURE, ESOPHAGEAL (ICD-530.3) PNEUMONIA, BACTERIAL NOS (ICD-482.9) SYMPTOM, COUGH (ICD-786.2) PREDIABETES (ICD-790.29) GERD (ICD-530.81) OSTEOARTHRITIS (ICD-715.90) KNEE PAIN, RIGHT, CHRONIC (ICD-719.46) HERNIATED LUMBAR DISC (ICD-722.10) HIATAL HERNIA (ICD-553.3) ANTICOAGULATION THERAPY (ICD-V58.61)  Past Surgical History: Last updated: 07/11/2007 esophogeal stricture dilation 01/2007 Cholesystectomy 04 Ankle surgery 2001 Partial hysterectomy 1971  Family History: Last updated: 12/16/2006 father age 33s  emphesema mother age 40 CAD,HTN daughter: brain cyst 2 sisters chol 2 brothers healthy 2 of 5 daughter with DM, HTN daughter with ovarian cancer 1 son with bone marrow cancer  Social History: Last updated: 12/16/2006 lost husband to cancer in 07/2006 5 children Occupation: Runner, 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  The patient denies fever, weight loss, vision loss, decreased hearing, hoarseness, chest pain, syncope, peripheral edema, prolonged cough, abdominal pain, incontinence, muscle weakness, depression, and enlarged lymph nodes.         Fullness in her ABD with exertion and certain positions  Vital Signs:  Patient profile:   77 year old female Height:      64 inches Weight:      234.25 pounds Pulse rate:   87 / minute BP sitting:   148 / 88  (left arm) Cuff size:   large  Vitals Entered By: Lysbeth Galas CMA (April 11, 2010 10:22 AM)  Physical Exam  General:  obese appearing elderly female in NAD Head:  normocephalic and atraumatic Neck:  no carotid bruit or thyromegaly no cervical or supraclavicular lymphadenopathy  Lungs:  Normal respiratory effort, chest expands symmetrically. Lungs are clear to auscultation, no crackles or wheezes. Heart:  Non-displaced PMI, chest non-tender; irregular rate and rhythm, S1, S2 without murmurs, rubs or gallops. Carotid upstroke normal, no bruit. Pedals normal pulses. Trace to +1 edema of the LE b/l, no varicosities. Abdomen:  Bowel sounds positive,abdomen soft and non-tender, obese abd  Msk:  Back normal, normal gait. Muscle strength and tone normal. Pulses:  pulses normal in all 4 extremities Extremities:  No clubbing or cyanosis. Neurologic:  Alert and oriented x 3. Skin:  Intact without lesions or  rashes. Psych:  Normal affect.   Impression & Recommendations:  Problem # 1:  ATRIAL FIBRILLATION (ICD-427.31) her heart rate is mildly elevated since we have stopped the diltiazem. Her edema has improved. For rate control, we will start digoxin 0.125 mg daily. Her sensation of fullness in her chest her abdomen could be from an elevated heart rate with exertion. Unable to exclude a GI etiology.  Her updated medication list for this problem includes:    Warfarin Sodium 5 Mg Tabs (Warfarin sodium) .Angela Ali... Take 1 tablet by mouth once a day as directed.    Metoprolol Succinate 200 Mg Xr24h-tab (Metoprolol succinate) .Angela Ali... Take 1 tablet by mouth once a day    Digoxin 0.125 Mg Tabs (Digoxin) .Angela Ali... Take one tablet by mouth daily  Orders: EKG w/ Interpretation (93000)  Problem # 2:  PERIPHERAL EDEMA (ICD-782.3) Her edema does seem to have improved off the calcium channel blocker/diltiazem. Clonidine has been holding her blood pressure and she does not seem to have particular side effects at this time with a new medication. We'll continue her on her diuretic.  Problem # 3:  HYPERTENSION (ICD-401.9) Blood pressure continues to be mildly elevated we will increase her clonidine to 0.2 mg b.i.d.. Continue her other medications  Her updated medication list for this problem includes:    Lisinopril 40 Mg Tabs (Lisinopril) .Angela Ali... Take 1 tablet by mouth once a day    Furosemide 80 Mg Tabs (Furosemide) .Angela Ali... Take 1 tablet by mouth once a day    Metoprolol Succinate 200 Mg Xr24h-tab (Metoprolol succinate) .Angela KitchenMarland KitchenMarland KitchenMarland Ali  Take 1 tablet by mouth once a day    Clonidine Hcl 0.2 Mg Tabs (Clonidine hcl) .Angela Ali... Take one tablet by mouth twice a day  Problem # 4:  OVERWEIGHT/OBESITY (ICD-278.02) I believe her weight is becoming more of an issue. This may explain some of the fullness in her abdomen with certain positions.  Patient Instructions: 1)  Your physician recommends that you schedule a follow-up appointment in: 6  months 2)  Your physician has recommended you make the following change in your medication: INCREASE Clonidine 0.2mg  two times a day. START Digoxin 0.125mg  once daily. Prescriptions: DIGOXIN 0.125 MG TABS (DIGOXIN) Take one tablet by mouth daily  #30 x 6   Entered by:   Lanny Hurst RN   Authorized by:   Dossie Arbour MD   Signed by:   Lanny Hurst RN on 04/11/2010   Method used:   [facility]  [address] [phone][facility] (retail[facility]       [address]       [place], [place]  [address]       Ph: [phone] or [phone]       Fax: [phone]   RxID:   [id number] CLONIDINE HCL 0.2 MG TABS (CLONIDINE HCL) Take one tablet by mouth twice a day  #60 x 6   Entered by:   Lanny Hurst RN   Authorized by:   Dossie Arbour MD   Signed by:   Lanny Hurst RN on 04/11/2010   Method used:   [facility]  [address] [phone]* (retail)       [address]       [place], [place]  [address]       Ph: [phone] or [phone]       Fax: [phone]   RxID:   [id number]

## 2010-05-29 NOTE — Progress Notes (Signed)
----   Converted from flag ---- ---- 02/25/2010 4:47 PM, Kerby Nora MD wrote: A1C, CMET, lipids, microalbumin Dx 250.00  ---- 02/25/2010 3:34 PM, Mills Koller wrote: This patient is scheduled for CPX with you, I need lab orders with dx, please. Thanks, Terri for Wednesday ------------------------------

## 2010-05-29 NOTE — Progress Notes (Signed)
Summary: Prior Authorization Singulair 10mg   Phone Note From Pharmacy   [facility]  [address] [id number] * Call For: ph [phone] fax [phone]  Summary of Call: Received fax from pharmacy stating that PA is needed for Singulair 10mg .  Called [phone] and spoke to [place], he will send over PA form.  Linde Gillis CMA Duncan Dull)  July 22, 2009 8:08 AM   Received PA form for Singulair, form in your IN box.   Initial call taken by: Linde Gillis CMA Duncan Dull),  July 22, 2009 11:52 AM     Appended Document: Prior Authorization Singulair 10mg  4 sample packs of 7 pills each given to pt, [employer] lot # [id number], exp 10/2011,  waiting for prior auth decision.   Appended Document: Prior Authorization Singulair 10mg  PA form faxed to [facility] at [phone].

## 2010-05-29 NOTE — Progress Notes (Signed)
Summary: refill request for cheratussin  Phone Note Refill Request   Refills Requested: Medication #1:  cheratussin AC   Last Refilled: 05/10/2009 Faxed request from [facility], no longer on med list.   [phone].  Initial call taken by: Lowella Petties CMA,  Sep 17, 2009 9:38 AM  Follow-up for Phone Call        Denied..not a chronic med.Marland KitchenMarland Kitchenpother new meds are treating asthma. if not controlled.Teran Daughenbaugh needs to call and let us know so we can adjust other meds.  Follow-up by: Kerby Nora MD,  Sep 17, 2009 10:12 AM  Additional Follow-up for Phone Call Additional follow up Details #1::        Rx called to pharmacy Additional Follow-up by: Benny Lennert CMA Duncan Dull),  Sep 17, 2009 10:13 AM

## 2010-05-29 NOTE — Assessment & Plan Note (Signed)
Summary: F6M/AMD  Medications Added CLONIDINE HCL 0.1 MG TABS (CLONIDINE HCL) Take 1 tablet by mouth two times a day      Allergies Added: NKDA  Visit Type:  Follow-up Primary Provider:  Kerby Nora MD  CC:  c/o being off balance a lot lately. Denies chest pain but does have occas. shortness of breath..  History of Present Illness: Angela Ali is a very pleasant 77 year old woman with a history of chronic atrial fibrillation, on warfarin, hypertension, chronic lower extremity edema, shortness of breath, who presents for routine followup.  She continues to have significant lower extremity edema. On her last visit to the clinic, her edema was getting worse. She takes a diuretic every day. She tries to be active though is limited due to chronic arthritic pain.  No episodes of chest pain, lightheadedness or dizziness.  she states having an echocardiogram over 10 years ago though nothing recently.  EKG shows atrial fibrillation with ventricular rate of 55 beats per minute, no significant ST or T wave changes  Current Medications (verified): 1)  Cartia Xt 300 Mg  Cp24 (Diltiazem Hcl Coated Beads) .... Take 1 Tablet By Mouth Once A Day 2)  Lisinopril 40 Mg  Tabs (Lisinopril) .... Take 1 Tablet By Mouth Once A Day 3)  Furosemide 80 Mg Tabs (Furosemide) .... Take 1 Tablet By Mouth Once A Day 4)  Warfarin Sodium 5 Mg  Tabs (Warfarin Sodium) .... Take 1 Tablet By Mouth Once A Day As Directed. 5)  Prilosec 20 Mg Cpdr (Omeprazole) .... 2 Tab  Po Daily 6)  Alendronate Sodium 70 Mg  Tabs (Alendronate Sodium) .... Take 1 Tablet  By Mouth Each Week. 7)  Lipitor 80 Mg Tabs (Atorvastatin Calcium) .... Take 1 Tablet By Mouth Once A Day 8)  Metoprolol Succinate 200 Mg Xr24h-Tab (Metoprolol Succinate) .... Take 1 Tablet By Mouth Once A Day 9)  Proair Hfa 108 (90 Base) Mcg/act  Aers (Albuterol Sulfate) .... 2 Inh Q4h As Needed Shortness of Breath 10)  Advair Diskus 250-50 Mcg/dose Aepb  (Fluticasone-Salmeterol) .Marland Kitchen.. 1 Inh Two Times A Day 11)  Fluorometholone 0.1 % Susp (Fluorometholone) .... As Needed 12)  Tessalon Perles 100 Mg  Caps (Benzonatate) .Marland Kitchen.. 1 By Mouth Three Times A Day As Needed Cough 13)  Allegra Allergy 60 Mg Tabs (Fexofenadine Hcl) .Marland Kitchen.. 1 Tab By Mouth Two Times A Day As Needed Allergies 14)  Hydrocodone-Acetaminophen 5-500 Mg Tabs (Hydrocodone-Acetaminophen) .Marland Kitchen.. 1 Tab By Mouth Daily As Needed Pain  Allergies (verified): No Known Drug Allergies  Past History:  Past Medical History: Last updated: 10/02/2008 OVERWEIGHT/OBESITY (ICD-278.02) CARDIOMYOPATHY, SECONDARY (ICD-425.9) ATRIAL FLUTTER (ICD-427.32) ATRIAL FIBRILLATION (ICD-427.31) CONGESTIVE HEART FAILURE (ICD-428.0) PERIPHERAL EDEMA (ICD-782.3) HYPERLIPIDEMIA (ICD-272.4) HYPERTENSION (ICD-401.9) COUGH (ICD-786.2) ALLERGIC RHINITIS (ICD-477.9) URI (ICD-465.9) OTHER DYSPHAGIA (ICD-787.29) COLONIC POLYPS, HYPERPLASTIC (ICD-211.3) DIVERTICULOSIS, COLON (ICD-562.10) BENIGN NEOPLASM OTH&UNSPEC SITE DIGESTIVE SYSTEM (ICD-211.9) FATIGUE (ICD-780.79) STRICTURE, ESOPHAGEAL (ICD-530.3) PNEUMONIA, BACTERIAL NOS (ICD-482.9) SYMPTOM, COUGH (ICD-786.2) PREDIABETES (ICD-790.29) GERD (ICD-530.81) OSTEOARTHRITIS (ICD-715.90) KNEE PAIN, RIGHT, CHRONIC (ICD-719.46) HERNIATED LUMBAR DISC (ICD-722.10) HIATAL HERNIA (ICD-553.3) ANTICOAGULATION THERAPY (ICD-V58.61)  Past Surgical History: Last updated: 07/11/2007 esophogeal stricture dilation 01/2007 Cholesystectomy 04 Ankle surgery 2001 Partial hysterectomy 1971  Family History: Last updated: 12/16/2006 father age 49s emphesema mother age 85 CAD,HTN daughter: brain cyst 2 sisters chol 2 brothers healthy 2 of 5 daughter with DM, HTN daughter with ovarian cancer 1 son with bone marrow cancer  Social History: Last updated: 12/16/2006 lost husband to cancer in 07/2006 5 children Occupation: Runner, 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  The patient denies fever, weight loss, weight gain, vision loss, decreased hearing, hoarseness, chest pain, syncope, dyspnea on exertion, prolonged cough, abdominal pain, incontinence, muscle weakness, depression, and enlarged lymph nodes.    Vital Signs:  Patient profile:   77 year old female Height:      64 inches Weight:      233 pounds BMI:     40.14 Pulse rate:   55 / minute BP supine:   150 / 92 BP sitting:   150 / 92  (left arm) BP standing:   150 / 92 Cuff size:   large  Vitals Entered By: Bishop Dublin, CMA (March 12, 2010 11:45 AM)  Physical Exam  General:  obese appearing elderly female in NAD Head:  normocephalic and atraumatic Neck:  no carotid bruit or thyromegaly no cervical or supraclavicular lymphadenopathy  Lungs:  Normal respiratory effort, chest expands symmetrically. Lungs are clear to auscultation, no crackles or wheezes. Heart:  Non-displaced PMI, chest non-tender; regular rate and rhythm, S1, S2 without murmurs, rubs or gallops. Carotid upstroke normal, no bruit. Pedals normal pulses. 1 to 2+ edema, no varicosities. Abdomen:  Bowel sounds positive,abdomen soft and non-tender without masses, obese abd  Msk:  Back normal, normal gait. Muscle strength and tone normal. Pulses:  pulses normal in all 4 extremities Extremities:  No clubbing or cyanosis. Neurologic:  Alert and oriented x 3. Skin:  Intact without lesions or rashes. Psych:  Normal affect.   Impression & Recommendations:  Problem # 1:  CONGESTIVE HEART FAILURE (ICD-428.0) No signs of CHF on clinical exam. ECHO shows normal EF and high normal RVSP. Edema is likely secondary to venous insufficiency  I am also concerned that the Cardizem/calcium channel blocker may be contributing to her  presentation and exacerbating or edema. I suggested that she hold her Cardizem. For her hypertension, I have given her clonidine 0.1 mg b.i.d. as she preferred to continue with generic medications.  She will come to the office for an EKG in one week to evaluate her heart rate and blood pressure  Problem # 2:  ATRIAL FIBRILLATION (ICD-427.31) her rate is slow on todays visit. We would like to hold her cardizem given her significant LE edema. If her rate increases on an EKG next week of cardizem, we could start digoxin. Continue metoprolol.  Her updated medication list for this problem includes:    Warfarin Sodium 5 Mg Tabs (Warfarin sodium) .Marland Kitchen... Take 1 tablet by mouth once a day as directed.    Metoprolol Succinate 200 Mg Xr24h-tab (Metoprolol succinate) .Marland Kitchen... Take 1 tablet by mouth once a day  Orders: EKG w/ Interpretation (93000)  Problem # 3:  PERIPHERAL EDEMA (ICD-782.3) Edema is likely secondary to venous insufficiency. ECHO recently showed normal RVSP. Edema may be exacerbated by the cardiizem. We will hold the cardizem for now.   Problem # 4:  HYPERTENSION (ICD-401.9) As we are holding the cardizem, we will start her on clonidine 0.1 mg by mouth two times a day, as she preferred to stay on generic medications.   The following medications were removed from the medication list:    Cartia Xt 300 Mg Cp24 (Diltiazem hcl coated beads) .Marland Kitchen... Take 1 tablet by mouth once a day Her updated medication list for  this problem includes:    Lisinopril 40 Mg Tabs (Lisinopril) .Marland Kitchen... Take 1 tablet by mouth once a day    Furosemide 80 Mg Tabs (Furosemide) .Marland Kitchen... Take 1 tablet by mouth once a day    Metoprolol Succinate 200 Mg Xr24h-tab (Metoprolol succinate) .Marland Kitchen... Take 1 tablet by mouth once a day    Clonidine Hcl 0.1 Mg Tabs (Clonidine hcl) .Marland Kitchen... Take 1 tablet by mouth two times a day  Patient Instructions: 1)  Your physician recommends that you schedule a follow-up appointment in: 1 week for  EKG and Blood Pressure check, 1 month f/u with Dr Mariah Milling. 2)  Your physician has recommended you make the following change in your medication: Stop Cardizem and start Clonidine 0.1mg  two times a day. Prescriptions: CLONIDINE HCL 0.1 MG TABS (CLONIDINE HCL) Take 1 tablet by mouth two times a day  #60 x 6   Entered by:   Cloyde Reams RN   Authorized by:   Dossie Arbour MD   Signed by:   Cloyde Reams RN on 03/12/2010   Method used:   [facility]  [address] [phone][facility] (retail)       [address]       [place], [place]  [address]       Ph: [phone] or [phone]       Fax: [phone]   RxID:   [id number]

## 2010-05-29 NOTE — Progress Notes (Signed)
Summary: Still coughing  Phone Note Call from Patient Call back at Home Phone [phone]   Caller: Patient Call For: Kerby Nora MD Summary of Call: Patient called and said that she was seen in the Saturday clinic last weekend.  Says she is still coughing and has phlem in her throat.  Patient has no other symptoms.  Is concerned that she may have pneumonia and wanted to know if Dr. Ermalene Searing thinks she should get a chest x-ray.  Please advise.   Initial call taken by: Linde Gillis CMA Duncan Dull),  June 14, 2009 9:09 AM  Follow-up for Phone Call        She has issues with chronic congestion and cough... I believe this is likely less due to infection than those chronic issues, but if pt has CP or SOB, fever >101.4 ...needs to be seen in [facility] by me. IF none of those.have her given it more time and follow up early next week. Follow-up by: Kerby Nora MD,  June 14, 2009 11:09 AM  Additional Follow-up for Phone Call Additional follow up Details #1::        patient advised.Consuello Masse CMA  Additional Follow-up by: Benny Lennert CMA Duncan Dull),  June 14, 2009 11:51 AM

## 2010-05-29 NOTE — Assessment & Plan Note (Signed)
Summary: COUGH, CONGESTION/ lb   Vital Signs:  Patient profile:   77 year old female Height:      64 inches Weight:      232.25 pounds BMI:     40.01 O2 Sat:      99 % on Room air Temp:     99 degrees F oral Pulse rate:   88 / minute Pulse rhythm:   regular BP sitting:   162 / 90  (left arm) Cuff size:   large  Vitals Entered By: [name] CMA Tyneshia Stivers Dull) (May 23, 2010 2:40 PM)  O2 Flow:  Room air CC: Cough and congestion   History of Present Illness: Pain across frontal area of face. Scratchy throat and cough.  "I just feel weak and not feeling good."  Sx started recently, in last 1-2 days.  No fevers known, but feeling  a little warm.  No sputum.  No ear pain.  Hadn't been using inhalers consistently.  Some wheezing.    Allergies: No Known Drug Allergies  Past History:  Past Medical History: Last updated: 10/02/2008 OVERWEIGHT/OBESITY (ICD-278.02) CARDIOMYOPATHY, SECONDARY (ICD-425.9) ATRIAL FLUTTER (ICD-427.32) ATRIAL FIBRILLATION (ICD-427.31) CONGESTIVE HEART FAILURE (ICD-428.0) PERIPHERAL EDEMA (ICD-782.3) HYPERLIPIDEMIA (ICD-272.4) HYPERTENSION (ICD-401.9) COUGH (ICD-786.2) ALLERGIC RHINITIS (ICD-477.9) URI (ICD-465.9) OTHER DYSPHAGIA (ICD-787.29) COLONIC POLYPS, HYPERPLASTIC (ICD-211.3) DIVERTICULOSIS, COLON (ICD-562.10) BENIGN NEOPLASM OTH&UNSPEC SITE DIGESTIVE SYSTEM (ICD-211.9) FATIGUE (ICD-780.79) STRICTURE, ESOPHAGEAL (ICD-530.3) PNEUMONIA, BACTERIAL NOS (ICD-482.9) SYMPTOM, COUGH (ICD-786.2) PREDIABETES (ICD-790.29) GERD (ICD-530.81) OSTEOARTHRITIS (ICD-715.90) KNEE PAIN, RIGHT, CHRONIC (ICD-719.46) HERNIATED LUMBAR DISC (ICD-722.10) HIATAL HERNIA (ICD-553.3) ANTICOAGULATION THERAPY (ICD-V58.61)  Social History: lost husband to cancer in 07/2006 5 children Occupation: Runner, 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  Otherwise negative.    Physical  Exam  General:  GEN: nad, alert and oriented HEENT: mucous membranes moist, tm wnl x2, sinuses not tender to palpation  NECK: supple w/o LA CV: IRR not tachy PULM: ctab, no inc wob EXT: no edema SKIN: no acute rash    Impression & Recommendations:  Problem # 1:  COUGH (ICD-786.2) I talked to patient about this.  She is nontoxic and there is no indication for imaging or antibiotics at this point.  We talked about using her inhalers and nose spray.  She needs to use the advair two times a day and not as needed.  Use SABA as needed.  She agrees/understands.  Continue flonase and follow up as needed.  She understood.  Nontoxic.  Likely a benign viral process.  Orders: Prescription Created Electronically [id number])  Complete Medication List: 1)  Lisinopril 40 Mg Tabs (Lisinopril) .... Take 1 tablet by mouth once a day 2)  Furosemide 80 Mg Tabs (Furosemide) .... Take 1 tablet by mouth once a day 3)  Warfarin Sodium 5 Mg Tabs (Warfarin sodium) .... Take 1 tablet by mouth once a day as directed. 4)  Alendronate Sodium 70 Mg Tabs (Alendronate sodium) .... Take 1 tablet  by mouth each week. 5)  Lipitor 80 Mg Tabs (Atorvastatin calcium) .... Take 1 tablet by mouth once a day 6)  Metoprolol Succinate 200 Mg Xr24h-tab (Metoprolol succinate) .... Take 1 tablet by mouth once a day 7)  Ventolin Hfa 108 (90 Base) Mcg/act Aers (Albuterol sulfate) .Marland Kitchen.. 1-2 puffs every 4 hours as needed for cough or wheeze 8)  Advair Diskus 250-50 Mcg/dose Aepb (Fluticasone-salmeterol) .Marland Kitchen.. 1 inh two times a day, rinse after use 9)  Fluorometholone 0.1 %  Susp (Fluorometholone) .... As needed 10)  Tessalon Perles 100 Mg Caps (Benzonatate) .Marland Kitchen.. 1 by mouth three times a day as needed cough 11)  Allegra Allergy 60 Mg Tabs (Fexofenadine hcl) .Marland Kitchen.. 1 tab by mouth two times a day as needed allergies 12)  Hydrocodone-acetaminophen 5-500 Mg Tabs (Hydrocodone-acetaminophen) .... 1/2-1 tab by mouth daily as needed pain 13)  Clonidine  Hcl 0.2 Mg Tabs (Clonidine hcl) .... Take one tablet by mouth twice a day 14)  Digoxin 0.125 Mg Tabs (Digoxin) .... Take one tablet by mouth daily 15)  Nexium 40 Mg Cpdr (Esomeprazole magnesium) .... One tablet daily 16)  Flonase 50 Mcg/act Susp (Fluticasone propionate) .... 2 sprays per nostril per day.  Patient Instructions: 1)  Use the advair (purple disk) 1 puff two times a day and rinse after use.   2)  Use the ventolin 1-2 puffs every 4 hours as needed for cough. 3)  Use the nose spray 2 sprays on each side once a day. 4)  Take the tessalon for the cough. 5)  This should gradually improve.  Take care.  Let us know if you aren't getting better gradually.  Prescriptions: VENTOLIN HFA 108 (90 BASE) MCG/ACT AERS (ALBUTEROL SULFATE) 1-2 puffs every 4 hours as needed for cough or wheeze  #1 x 5   Entered and Authorized by:   Crawford Givens MD   Signed by:   Crawford Givens MD on 05/23/2010   Method used:   [facility]        [facility] [address] [phone][facility] (retail)       [address]       [place], [place]  [id number]       Ph: [phone] or [phone]       Fax: [phone]   RxID:   [id number]    Orders Added: 1)  Est. Patient Level III [40347] 2)  Prescription Created Electronically [phone]    Current Allergies (reviewed today): No known allergies

## 2010-05-29 NOTE — Assessment & Plan Note (Signed)
Summary: FEELING TIRED/NT   Vital Signs:  Patient profile:   77 year old female Height:      65 inches Weight:      232.2 pounds BMI:     38.78 Temp:     98.1 degrees F oral Pulse rate:   84 / minute Pulse rhythm:   regular BP sitting:   120 / 70  (left arm) Cuff size:   large  Vitals Entered By: Benny Lennert CMA Duncan Dull) (August 07, 2009 2:18 PM)  History of Present Illness: Chief complaint feeling tired, DIZZY AND WEAK patient request EKGdomen continues.  Weakness continues ongoing x last 6-12 months..no sudden change. No chest pain. Not sleeping well at night due to frequent urination.Marland Kitchenstill taking furosemide at night. No snoring at night.   Occ vertigo with moving.   ? depressed.. sad when cannot be active.   Shortness of breath and cough is improved on advair. Also on singulair but not sure if helping.  Not needing proair for rescue.  Pressure over face today.  Pressure in upper abdomen..chronically.  Low back pain, radiateds to B legs Vicodin makes her to sedated, confused.   In past has seen ORTHo ..gotten knee injections.  Referred to pain center..but cancelled because does not want injections in spine. no numbness or tingling in leg.   Cannot take NBSAIDs due to being on coumadin.    Problems Prior to Update: 1)  Asthma, Persistent, Moderate  (ICD-493.90) 2)  Acute Bronchitis  (ICD-466.0) 3)  Fatigue  (ICD-780.79) 4)  Unspecified Vitamin D Deficiency  (ICD-268.9) 5)  Foot Pain, Left  (ICD-729.5) 6)  Low Back Pain, Chronic  (ICD-724.2) 7)  Overweight/obesity  (ICD-278.02) 8)  Cardiomyopathy, Secondary  (ICD-425.9) 9)  Atrial Flutter  (ICD-427.32) 10)  Atrial Fibrillation  (ICD-427.31) 11)  Congestive Heart Failure  (ICD-428.0) 12)  Peripheral Edema  (ICD-782.3) 13)  Hyperlipidemia  (ICD-272.4) 14)  Hypertension  (ICD-401.9) 15)  Cough  (ICD-786.2) 16)  Allergic Rhinitis  (ICD-477.9) 17)  Colonic Polyps, Hyperplastic  (ICD-211.3) 18)   Diverticulosis, Colon  (ICD-562.10) 19)  Benign Neoplasm Oth&unspec Site Digestive System  (ICD-211.9) 20)  Fatigue  (ICD-780.79) 21)  Stricture, Esophageal  (ICD-530.3) 22)  Diabetes Mellitus, Type II, Borderline  (ICD-790.29) 23)  Gerd  (ICD-530.81) 24)  Osteoarthritis  (ICD-715.90) 25)  Knee Pain, Right, Chronic  (ICD-719.46) 26)  Herniated Lumbar Disc  (ICD-722.10) 27)  Hiatal Hernia  (ICD-553.3) 28)  Gynecological Examination, Routine  (ICD-V72.31) 29)  Osteoporosis  (ICD-733.00) 30)  Screening Mammogram Nec  (ICD-V76.12) 31)  Encounter For Therapeutic Drug Monitoring  (ICD-V58.83) 32)  Anticoagulation Therapy  (ICD-V58.61)  Current Medications (verified): 1)  Cartia Xt 300 Mg  Cp24 (Diltiazem Hcl Coated Beads) .... Take 1 Tablet By Mouth Once A Day 2)  Lisinopril 40 Mg  Tabs (Lisinopril) .... Take 1 Tablet By Mouth Once A Day 3)  Furosemide 80 Mg Tabs (Furosemide) .... Take 1 Tablet By Mouth Once A Day 4)  Warfarin Sodium 5 Mg  Tabs (Warfarin Sodium) .... Take 1 Tablet By Mouth Once A Day As Directed. 5)  Nexium 40 Mg Cpdr (Esomeprazole Magnesium) .... Take 1 Tablet By Mouth Once A Day 6)  Alendronate Sodium 70 Mg  Tabs (Alendronate Sodium) .... Take 1 Tablet  By Mouth Each Week. 7)  Simvastatin 80 Mg  Tabs (Simvastatin) .... Take 1 Tablet By Mouth Once A Day 8)  Metoprolol Succinate 200 Mg Xr24h-Tab (Metoprolol Succinate) .... Take 1 Tablet By Mouth Once A Day  9)  Tessalon 200 Mg Caps (Benzonatate) .... One Tab By Mouth Three Times A Day 10)  Proair Hfa 108 (90 Base) Mcg/act  Aers (Albuterol Sulfate) .... 2 Inh Q4h As Needed Shortness of Breath 11)  Advair Diskus 100-50 Mcg/dose Aepb (Fluticasone-Salmeterol) .Marland Kitchen.. 1 Inh Two Times A Day 12)  Amitriptyline Hcl 25 Mg Tabs (Amitriptyline Hcl) .Marland Kitchen.. 1 Tab By Mouth At Bedtime  Allergies (verified): No Known Drug Allergies  Past History:  Past medical, surgical, family and social histories (including risk factors) reviewed, and no  changes noted (except as noted below).  Past Medical History: Reviewed history from 10/02/2008 and no changes required. OVERWEIGHT/OBESITY (ICD-278.02) CARDIOMYOPATHY, SECONDARY (ICD-425.9) ATRIAL FLUTTER (ICD-427.32) ATRIAL FIBRILLATION (ICD-427.31) CONGESTIVE HEART FAILURE (ICD-428.0) PERIPHERAL EDEMA (ICD-782.3) HYPERLIPIDEMIA (ICD-272.4) HYPERTENSION (ICD-401.9) COUGH (ICD-786.2) ALLERGIC RHINITIS (ICD-477.9) URI (ICD-465.9) OTHER DYSPHAGIA (ICD-787.29) COLONIC POLYPS, HYPERPLASTIC (ICD-211.3) DIVERTICULOSIS, COLON (ICD-562.10) BENIGN NEOPLASM OTH&UNSPEC SITE DIGESTIVE SYSTEM (ICD-211.9) FATIGUE (ICD-780.79) STRICTURE, ESOPHAGEAL (ICD-530.3) PNEUMONIA, BACTERIAL NOS (ICD-482.9) SYMPTOM, COUGH (ICD-786.2) PREDIABETES (ICD-790.29) GERD (ICD-530.81) OSTEOARTHRITIS (ICD-715.90) KNEE PAIN, RIGHT, CHRONIC (ICD-719.46) HERNIATED LUMBAR DISC (ICD-722.10) HIATAL HERNIA (ICD-553.3) ANTICOAGULATION THERAPY (ICD-V58.61)  Past Surgical History: Reviewed history from 07/11/2007 and no changes required. esophogeal stricture dilation 01/2007 Cholesystectomy 04 Ankle surgery 2001 Partial hysterectomy 1971  Family History: Reviewed history from 12/16/2006 and no changes required. father age 54s emphesema mother age 63 CAD,HTN daughter: brain cyst 2 sisters chol 2 brothers healthy 2 of 5 daughter with DM, HTN daughter with ovarian cancer 1 son with bone marrow cancer  Social History: Reviewed history from 12/16/2006 and no changes required. 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 CV:  Denies chest pain or discomfort. Resp:  Denies shortness of breath, sputum productive, and wheezing. GI:  Denies abdominal pain. GU:  Denies dysuria.  Physical Exam  General:  obese appearing elderly female in NAD Eyes:   No corneal or conjunctival inflammation noted. EOMI. Perrla. Funduscopic exam benign, without hemorrhages, exudates or papilledema. Vision grossly normal. Ears:  External ear exam shows no significant lesions or deformities.  Otoscopic examination reveals clear canals, tympanic membranes are intact bilaterally without bulging, retraction, inflammation or discharge. Hearing is grossly normal bilaterally. Mouth:  MMM, minimal clear post nasal drip Neck:  no carotid bruit or thyromegaly no cervical or supraclavicular lymphadenopathy  Lungs:  Normal respiratory effort, chest expands symmetrically. Lungs are clear to auscultation, no crackles or wheezes. Heart:  Normal rate and regular rhythm. S1 and S2 normal without gallop, murmur, click, rub or other extra sounds. Abdomen:  Bowel sounds positive,abdomen soft and non-tender without masses, organomegaly or hernias noted. obese abd no renal bruits  Pulses:  R and L posterior tibial pulses are full and equal bilaterally  Extremities:  1+ left pedal edema and 1+ right pedal edema.   Neurologic:  sensation intact to light touch, gait normal, and DTRs symmetrical and normal.  no tremor Skin:  Intact without suspicious lesions or rashes Psych:  Oriented X3, memory intact for recent and remote, and subdued.     Impression & Recommendations:  Problem # 1:  NOCTURIA (ICD-788.43) Resulting in insomnia and fatigue. Most likely due to significant nocturia.  Continues to take furosemide at night..recommend  strongly to use in AMs.   Problem # 2:  FATIGUE (ICD-780.79) Element of depression...as well as insomnia.  Have evaluated and treated vit deficiency..no anemai and thyroid issue.  Will add amitryptiline to help with sleep and mood. May also help with chronic back pain.   Problem # 3:  LOW BACK PAIN, CHRONIC (ICD-724.2) Not interested in spinal injections, no surgical treatment indicated. NSAIDs contraindicated as she is on coumadin.  Will treat with  amitryptiline..consider increasing or adding Cymbalta for chronic pain. The following medications were removed from the medication list:    Hydrocodone-acetaminophen 5-500 Mg Tabs (Hydrocodone-acetaminophen) .Marland Kitchen... 1 tab by mouth two times a day as needed pain, limit use for severe pain  Orders: Prescription Created Electronically [id number])  Problem # 4:  ASTHMA, PERSISTENT, MODERATE (ICD-493.90) Improved on Advair. Stop singulair as no improvement on this.  Close follow up. Minimal albuterol rescue needed.  The following medications were removed from the medication list:    Singulair 10 Mg Tabs (Montelukast sodium) .Marland Kitchen... 1 tab by mouth at bedtime Her updated medication list for this problem includes:    Proair Hfa 108 (90 Base) Mcg/act Aers (Albuterol sulfate) .Marland Kitchen... 2 inh q4h as needed shortness of breath    Advair Diskus 100-50 Mcg/dose Aepb (Fluticasone-salmeterol) .Marland Kitchen... 1 inh two times a day  Complete Medication List: 1)  Cartia Xt 300 Mg Cp24 (Diltiazem hcl coated beads) .... Take 1 tablet by mouth once a day 2)  Lisinopril 40 Mg Tabs (Lisinopril) .... Take 1 tablet by mouth once a day 3)  Furosemide 80 Mg Tabs (Furosemide) .... Take 1 tablet by mouth once a day 4)  Warfarin Sodium 5 Mg Tabs (Warfarin sodium) .... Take 1 tablet by mouth once a day as directed. 5)  Nexium 40 Mg Cpdr (Esomeprazole magnesium) .... Take 1 tablet by mouth once a day 6)  Alendronate Sodium 70 Mg Tabs (Alendronate sodium) .... Take 1 tablet  by mouth each week. 7)  Simvastatin 80 Mg Tabs (Simvastatin) .... Take 1 tablet by mouth once a day 8)  Metoprolol Succinate 200 Mg Xr24h-tab (Metoprolol succinate) .... Take 1 tablet by mouth once a day 9)  Tessalon 200 Mg Caps (Benzonatate) .... One tab by mouth three times a day 10)  Proair Hfa 108 (90 Base) Mcg/act Aers (Albuterol sulfate) .... 2 inh q4h as needed shortness of breath 11)  Advair Diskus 100-50 Mcg/dose Aepb (Fluticasone-salmeterol) .Marland Kitchen.. 1 inh two times a  day 12)  Amitriptyline Hcl 25 Mg Tabs (Amitriptyline hcl) .Marland Kitchen.. 1 tab by mouth at bedtime  Patient Instructions: 1)  Change furosemide to in MORNING VERY IMPORTANT. 2)  Start amitryptiline at bedtime for back pain. 3)  Stop singulair due to ? sedation.  4)  Follow up appt in 2 weeks.  Prescriptions: AMITRIPTYLINE HCL 25 MG TABS (AMITRIPTYLINE HCL) 1 tab by mouth at bedtime  #30 x 3   Entered and Authorized by:   Kerby Nora MD   Signed by:   Kerby Nora MD on 08/07/2009   Method used:   [facility]        [id number]       [address]       [place], [place]  [address]       Ph: [phone] or [phone]       Fax: [phone]   RxID:   [id number]   Current Allergies (reviewed today): No known allergies   Appended Document: Orders Update Addendum: EKG stable from 12/2008, no ST changes, Q waves. Stable rate controlled afib.Kerby Nora MD  August 20, 2009 5:33 PM  Clinical Lists Changes  Orders: Added new Service order of EKG w/ Interpretation (93000) - Signed

## 2010-05-29 NOTE — Progress Notes (Signed)
Summary: Drainage in throat  Phone Note Call from Patient Call back at Work Phone [phone]   Caller: Patient Call For: Kerby Nora MD Summary of Call: Patient states that she is still taking cough syrup for her cough which is getting better, but she is still having drainage down her throat.  Wanted to know if she could take Benadryl or something to dry up the drainage.  The drainage is whats making her cough.  Please advise. Initial call taken by: Linde Gillis CMA Duncan Dull),  May 22, 2009 1:04 PM  Follow-up for Phone Call        Benadryl would be reasonable to try but will make sleepy. May be helpful to try some nasal afrin for the next 3-4 days in addition to her current cough medicines Follow-up by: Hannah Beat MD,  May 22, 2009 1:38 PM  Additional Follow-up for Phone Call Additional follow up Details #1::        Patient advised.Consuello Masse CMA  Additional Follow-up by: Benny Lennert CMA Duncan Dull),  May 22, 2009 2:15 PM

## 2010-05-29 NOTE — Progress Notes (Signed)
Summary: Back aches  Phone Note Call from Patient Call back at Home Phone [phone]   Caller: Patient Call For: Angela Nora MD Summary of Call: Patient has a herniated disc and has been taking Tylenol which has not been helping. Patient states that you are aware of the problem with her back.  Patient wants to know if you can call something in  to help with the ache that she is having in her back that goes down into her legs.  [facility]. Initial call taken by: Sydell Axon LPN,  January 28, 2010 2:52 PM  Follow-up for Phone Call        Phone Call Completed, Rx Called In Follow-up by: Benny Lennert CMA Duncan Dull),  January 28, 2010 4:25 PM    New/Updated Medications: HYDROCODONE-ACETAMINOPHEN 5-500 MG TABS (HYDROCODONE-ACETAMINOPHEN) 1 tab by mouth daily as needed pain Prescriptions: HYDROCODONE-ACETAMINOPHEN 5-500 MG TABS (HYDROCODONE-ACETAMINOPHEN) 1 tab by mouth daily as needed pain  #30 x 0   Entered and Authorized by:   Angela Nora MD   Signed by:   Angela Nora MD on 01/28/2010   Method used:   Telephoned to ...       [facility]  [address] [phone]* (retail)       [address]       [place], [place]  [id number]       Ph: [phone] or [phone]       Fax: [phone]   RxID:   [id number]

## 2010-05-29 NOTE — Progress Notes (Signed)
Summary: requests allegra  Phone Note Call from Patient   Caller: Patient Summary of Call: Pt is having some dizziness off and on, some sinus pressure.  She is asking that allegra be called to [facility].  She says this has helped her before. Initial call taken by: Lowella Petties CMA,  January 13, 2010 8:46 AM  Follow-up for Phone Call        Please let her know Joyce Copa is OTC.  If she still needs an rx, please let me know. Ruthe Mannan MD  January 13, 2010 9:53 AM  She does want script, it will cost her less. Follow-up by: Lowella Petties CMA,  January 13, 2010 9:58 AM    New/Updated Medications: ALLEGRA ALLERGY 60 MG TABS (FEXOFENADINE HCL) 1 tab by mouth two times a day as needed allergies Prescriptions: ALLEGRA ALLERGY 60 MG TABS (FEXOFENADINE HCL) 1 tab by mouth two times a day as needed allergies  #60 x 0   Entered and Authorized by:   Ruthe Mannan MD   Signed by:   Ruthe Mannan MD on 01/13/2010   Method used:   [facility]        [id number] ([facility]       [address]       [place], [place]  [address]       Ph: [phone] or [phone]       Fax: [phone]   RxID:   [id number]

## 2010-05-29 NOTE — Progress Notes (Signed)
Summary: magnesium  Phone Note Call from Patient Call back at Work Phone [phone]   Caller: Patient Call For: Kerby Nora MD Summary of Call: Patient wants to know if it is okay to take Magnesium and also is that good for her bones Initial call taken by: Benny Lennert CMA Duncan Dull),  August 23, 2009 10:37 AM  Follow-up for Phone Call        Okay to take magnesium, good for bones and body. Follow-up by: Kerby Nora MD,  August 23, 2009 1:45 PM  Additional Follow-up for Phone Call Additional follow up Details #1::        patient advised.Consuello Masse CMA  Additional Follow-up by: Benny Lennert CMA Duncan Dull),  August 23, 2009 2:31 PM

## 2010-05-29 NOTE — Progress Notes (Signed)
Summary: requests refill on tesselon  Phone Note Call from Patient Call back at Home Phone [phone]   Caller: Patient Summary of Call: Pt still has cough and has run out of tesselon.  She is asking if she can have a refill sent to [facility]. Initial call taken by: Lowella Petties CMA,  June 20, 2009 9:50 AM  Follow-up for Phone Call        I will refill, but she definately seems to have a chronic cough. Could be from chronic  lung issues, allergies or GERD. Since she is being treated for GERD with nexium and allergies with singulair . Marland KitchenMarland KitchenMarland KitchenI think we therefore need to eval lung with spirometry lung function tests  let me know if I can schedule.  Follow-up by: Kerby Nora MD,  June 20, 2009 10:51 AM  Additional Follow-up for Phone Call Additional follow up Details #1::        Patient agreeable for lung test.Heather Humberto Leep CMA  Additional Follow-up by: Benny Lennert CMA (AAMA),  June 20, 2009 11:06 AM    Prescriptions: TESSALON 200 MG CAPS (BENZONATATE) one tab by mouth three times a day  #30 x 3   Entered and Authorized by:   Kerby Nora MD   Signed by:   Kerby Nora MD on 06/20/2009   Method used:   [facility]        [id number]       [address]       [place], [place]  [address]       Ph: [phone] or [phone]       Fax: [phone]   RxID:   [id number]   Appended Document: Orders Update     Clinical Lists Changes  Orders: Added new Referral order of Misc. Referral (Misc. Ref) - Signed

## 2010-05-29 NOTE — Assessment & Plan Note (Signed)
Summary: BP CHECK;EKG/AMD  Nurse Visit   Vital Signs:  Patient profile:   77 year old female Weight:      229 pounds Pulse rate:   85 / minute Pulse rhythm:   afib BP sitting:   128 / 72  (left arm)  Vitals Entered By: Cloyde Reams RN (March 17, 2010 10:42 AM) CC: Discontinued Diltiazem at last OV, started on Clonidine bid.  BP check and EKG.  Evaluate LE edema Comments Pt reports decr LE edema wt down from 232 to 229 today.  BP and HR good with medication changes.  Dr Mariah Milling reviewed EKG and vital signs.  Continue same medications, and keep 1 month f/u with Dr Mariah Milling to re-evaluate edema and vital signs. Cloyde Reams RN  March 17, 2010 10:48 AM    Patient Instructions: 1)  Your physician recommends that you keep scheduled  follow-up appointment on 04/11/10 at 10:15am with Dr Mariah Milling. 2)  Your physician recommends that you continue on your current medications as directed. Please refer to the Current Medication list given to you today.   Allergies: No Known Drug Allergies  Orders Added: 1)  EKG w/ Interpretation [93000]

## 2010-05-29 NOTE — Progress Notes (Signed)
Summary: pt going back to prevacid  Phone Note Call from Patient   Caller: Patient Call For: Kerby Nora MD Summary of Call: Pt has been taking omeprazole 20 mg for reflux and she doesnt think that is helping very much.  She says she wants to go back to prevacid.  She is going to get to get this otc.. Initial call taken by: Lowella Petties CMA, AAMA,  April 07, 2010 10:22 AM  Follow-up for Phone Call        Okay to return to prevacid 30 mg daily. This may not help much more.. so if it does not we will ahve to return to prescription med.  Follow-up by: Kerby Nora MD,  April 08, 2010 10:22 AM  Additional Follow-up for Phone Call Additional follow up Details #1::        Patient advised.Consuello Masse CMA   Additional Follow-up by: Benny Lennert CMA Duncan Dull),  April 08, 2010 10:22 AM

## 2010-05-29 NOTE — Miscellaneous (Signed)
Summary: Orders Update pft charges  Clinical Lists Changes  Orders: Added new Service order of Carbon Monoxide diffusing w/capacity (94720) - Signed Added new Service order of Lung Volumes (94240) - Signed Added new Service order of Spirometry (Pre & Post) (94060) - Signed 

## 2010-05-29 NOTE — Assessment & Plan Note (Signed)
Summary: COLD/BACK/RBH   Vital Signs:  Patient profile:   77 year old female Height:      64 inches Weight:      232.0 pounds BMI:     39.97 Temp:     98.5 degrees F oral Pulse rate:   78 / minute Pulse rhythm:   regular BP sitting:   130 / 80  (left arm) Cuff size:   large  Vitals Entered By: Benny Lennert CMA Duncan Dull) (January 31, 2010 11:30 AM)  History of Present Illness: Chief complaint cold/back pain    At last OV in 07/2009:  LOW BACK PAIN, CHRONIC (ICD-724.2) Not interested in spinal injections, no surgical treatment indicated. NSAIDs contraindicated as she is on coumadin.  Will treat with amitryptiline..consider increasing or adding Cymbalta for chronic pain. The following medications were removed from the medication list:    Hydrocodone-acetaminophen 5-500 Mg Tabs (Hydrocodone-acetaminophen) .Marland Kitchen... 1 tab by mouth two times a day as needed pain, limit use for severe pain   Today she states that back pain.Marland Kitchenintermittant pain, worsening.  1 week ago chasing lizard and jumped up quickly..back has hurt more since..left lower back. Ache..no radiculopathy. No numbenss. Both legs feel weak in both antieriorly.  Amitryptiline for pain at last OV .Marland Kitchenno benefit.  In past no relief with tramadol, celebrex, diclofenac, mobic.   Vicodin makes her sleepy.  PT no help in past.   11/2008 Lumbar X-ray: Chronic appearing degenerative change throughout the lumbar spine, most pronounced at L4-5 and L5-S1.  No identifiably acute finding. No recent MRI...no past back surgery.          Has CPX scheudled in 02/2010  Acute Visit History:      The patient complains of cough and sore throat.  These symptoms began 1 week ago.  She denies chest pain, earache, fever, nasal discharge, and sinus problems.  Other comments include: On advair for asthma  post nasal drip  Allergies...tried allegra but expensive..helped some but has been out. no GERD.Marland Kitchenon nexium well controlled.        The  character of the cough is described as nonproductive.  There is no history of wheezing or shortness of breath associated with her cough.             Allergies (verified): No Known Drug Allergies  Past History:  Past medical, surgical, family and social histories (including risk factors) reviewed, and no changes noted (except as noted below).  Past Medical History: Reviewed history from 10/02/2008 and no changes required. OVERWEIGHT/OBESITY (ICD-278.02) CARDIOMYOPATHY, SECONDARY (ICD-425.9) ATRIAL FLUTTER (ICD-427.32) ATRIAL FIBRILLATION (ICD-427.31) CONGESTIVE HEART FAILURE (ICD-428.0) PERIPHERAL EDEMA (ICD-782.3) HYPERLIPIDEMIA (ICD-272.4) HYPERTENSION (ICD-401.9) COUGH (ICD-786.2) ALLERGIC RHINITIS (ICD-477.9) URI (ICD-465.9) OTHER DYSPHAGIA (ICD-787.29) COLONIC POLYPS, HYPERPLASTIC (ICD-211.3) DIVERTICULOSIS, COLON (ICD-562.10) BENIGN NEOPLASM OTH&UNSPEC SITE DIGESTIVE SYSTEM (ICD-211.9) FATIGUE (ICD-780.79) STRICTURE, ESOPHAGEAL (ICD-530.3) PNEUMONIA, BACTERIAL NOS (ICD-482.9) SYMPTOM, COUGH (ICD-786.2) PREDIABETES (ICD-790.29) GERD (ICD-530.81) OSTEOARTHRITIS (ICD-715.90) KNEE PAIN, RIGHT, CHRONIC (ICD-719.46) HERNIATED LUMBAR DISC (ICD-722.10) HIATAL HERNIA (ICD-553.3) ANTICOAGULATION THERAPY (ICD-V58.61)  Past Surgical History: Reviewed history from 07/11/2007 and no changes required. esophogeal stricture dilation 01/2007 Cholesystectomy 04 Ankle surgery 2001 Partial hysterectomy 1971  Family History: Reviewed history from 12/16/2006 and no changes required. father age 13s emphesema mother age 46 CAD,HTN daughter: brain cyst 2 sisters chol 2 brothers healthy 2 of 5 daughter with DM, HTN daughter with ovarian cancer 1 son with bone marrow cancer  Social History: Reviewed history from 12/16/2006 and no changes required. 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 EOMI. Perrla. Funduscopic exam benign, without hemorrhages, exudates or papilledema. Vision grossly normal. Ears:  External ear exam shows no significant lesions or deformities.  Otoscopic examination reveals clear canals, tympanic membranes are intact bilaterally without bulging, retraction, inflammation or discharge. Hearing is grossly normal bilaterally. Nose:  nasal dischargemucosal pallor.   Mouth:  Oral mucosa and oropharynx without lesions or exudates.  Teeth in good repair. Neck:  no carotid bruit or thyromegaly no cervical or supraclavicular lymphadenopathy  Lungs:  Normal respiratory effort, chest expands symmetrically. Lungs are clear to auscultation, no crackles or wheezes. Heart:  irr rate and regular rhythm. S1 and S2 normal without gallop, murmur, click, rub or other extra sounds. Msk:  ttp in  lumbar spine, paraspinous muscles oand right buttock. Neg SLR, neg Faber's Pulses:  R and L posterior tibial pulses are full and equal bilaterally  Extremities:  no edema Neurologic:  No cranial nerve deficits noted. Station and gait are normal.Sensory, motor and coordinative functions appear intact.   Impression & Recommendations:  Problem # 1:  ALLERGIC RHINITIS (ICD-477.9) Allegra, fluticasone nasal. No clear sign of bacterial infection or asthma flare.  Her updated medication list for this problem includes:    Allegra Allergy 60 Mg Tabs (Fexofenadine hcl) .Marland Kitchen... 1 tab by mouth two times a day as needed allergies  Problem # 2:  GERD (ICD-530.81) Restart nexium. Her updated medication list for this problem includes:    Nexium 40 Mg Cpdr (Esomeprazole magnesium) .Marland Kitchen... Take 1 tablet by mouth once a day  Problem # 3:  ASTHMA, PERSISTENT, MODERATE (ICD-493.90) Improved control on advair...repeat PFTs at CPX.  Her updated medication list for this  problem includes:    Proair Hfa 108 (90 Base) Mcg/act Aers (Albuterol sulfate) .Marland Kitchen... 2 inh q4h as needed shortness of breath    Advair Diskus 250-50 Mcg/dose Aepb (Fluticasone-salmeterol) .Marland Kitchen... 1 inh two times a day  Problem # 4:  LOW BACK PAIN, CHRONIC (ICD-724.2) Degenerative changes in spine..NSAIDs contraindicated (minimal response in past anyway on short term use).  No benefit from amitrytiline, tramadol, vicodin helps but makes her sleepy. In past PT has not helped.  Years ago in [place] spinal injections relieved pain for a while.Dorinda Hill now open to referral to pain center for consideration of steroid injections. Has not had MRI in long time...leg weakness subjective .Marland Kitchenno weakness on exam...no definate concern for spinal stenosis (also Has B knee osteo), but MRI eval may be helpful. Pt not interested in surgery unless emergent.  Her updated medication list for this problem includes:    Hydrocodone-acetaminophen 5-500 Mg Tabs (Hydrocodone-acetaminophen) .Marland Kitchen... 1 tab by mouth daily as needed pain  Orders: Pain Clinic Referral (Pain)  Complete Medication List: 1)  Cartia Xt 300 Mg Cp24 (Diltiazem hcl coated beads) .... Take 1 tablet by mouth once a day 2)  Lisinopril 40 Mg Tabs (Lisinopril) .... Take 1 tablet by mouth once a day 3)  Furosemide 80 Mg Tabs (Furosemide) .... Take 1 tablet by mouth once a day 4)  Warfarin Sodium 5 Mg Tabs (Warfarin sodium) .... Take 1 tablet by mouth once a day as directed. 5)  Nexium 40 Mg Cpdr (Esomeprazole magnesium) .... Take 1 tablet by mouth once a day 6)  Alendronate Sodium 70 Mg Tabs (Alendronate sodium) .... Take 1 tablet  by  mouth each week. 7)  Lipitor 80 Mg Tabs (Atorvastatin calcium) .... Take 1 tablet by mouth once a day 8)  Metoprolol Succinate 200 Mg Xr24h-tab (Metoprolol succinate) .... Take 1 tablet by mouth once a day 9)  Proair Hfa 108 (90 Base) Mcg/act Aers (Albuterol sulfate) .... 2 inh q4h as needed shortness of breath 10)   Advair Diskus 250-50 Mcg/dose Aepb (Fluticasone-salmeterol) .Marland Kitchen.. 1 inh two times a day 11)  Fluorometholone 0.1 % Susp (Fluorometholone) .... As needed 12)  Tessalon Perles 100 Mg Caps (Benzonatate) .Marland Kitchen.. 1 by mouth three times a day as needed cough 13)  Allegra Allergy 60 Mg Tabs (Fexofenadine hcl) .Marland Kitchen.. 1 tab by mouth two times a day as needed allergies 14)  Hydrocodone-acetaminophen 5-500 Mg Tabs (Hydrocodone-acetaminophen) .Marland Kitchen.. 1 tab by mouth daily as needed pain  Patient Instructions: 1)  Try to get back on allegra. 2)  Use nasal steroid 2 sprays. 3)  Continue nexium for reflux. 4)   Referral Appointment Information 5)  Day/Date: 6)  Time: 7)  Place/MD: 8)  Address: 9)  Phone/Fax: 10)  Patient given appointment information. Information/Orders faxed/mailed.   Current Allergies (reviewed today): No known allergies

## 2010-05-29 NOTE — Progress Notes (Signed)
Summary: regarding diet supplement  Phone Note Call from Patient   Caller: Patient Call For: Kerby Nora MD Summary of Call: Patient wants to know if she can use a diet control type substance that you sprinkle on your food?  Initial call taken by: Mills Koller,  Sep 13, 2009 2:28 PM  Follow-up for Phone Call        yes i am aware of this supplement..increases smell of food. Okay to use with current meds.  Follow-up by: Kerby Nora MD,  Sep 13, 2009 2:38 PM  Additional Follow-up for Phone Call Additional follow up Details #1::        Advised pt, she wanted you to know that she is going to [facility] now.                Lowella Petties CMA  Sep 13, 2009 4:15 PM

## 2010-05-29 NOTE — Progress Notes (Signed)
  Phone Note Call from Patient Call back at [facility] Phone [phone]   Caller: Patient Reason for Call: Acute Illness Summary of Call: Recent acute bronchial illness.  Pt complaining of persistent cough.  On Z-max and Hycodan cough syrup without much relief.  Denies any fever, wheezing, dyspnea, pleuritic pain, or hemoptysis.  Nonsmoker.  Intermittent headache with cough.  Pt will take Tylenol for headache and f/u with primary this week if cough no better. Initial call taken by: Evelena Peat MD,  April 28, 2009 6:37 PM

## 2010-05-29 NOTE — Progress Notes (Signed)
Summary: omega 3  Phone Note Call from Patient Call back at Home Phone [phone]   Caller: Patient Call For: Kerby Nora MD Summary of Call: Patient is asking if it is okay for her to take omega 3 with being on coumadin. Please advise.  Initial call taken by: [name],  April 25, 2010 2:09 PM  Follow-up for Phone Call        yes.  Follow-up by: Kerby Nora MD,  April 25, 2010 2:40 PM  Additional Follow-up for Phone Call Additional follow up Details #1::        Patient advised.  Additional Follow-up by: [name],  April 25, 2010 3:00 PM

## 2010-05-29 NOTE — Assessment & Plan Note (Signed)
Summary: EST PT/NE   CC:  ROV; No acute complaints; Bilat. ankle edema.  History of Present Illness: Angela Ali is a very pleasant 77 year old woman with a history of chronic atrial fibrillation, on warfarin, hypertension, chronic lower extremity edema, shortness of breath, who presents for routine followup.  She states that overall she is doing well. She has been having some trouble with allergies and shortness of breath recently. She continues to have lower externally swelling and it is a little bit worse recently. She takes a diuretic every day. She tries to be active though is limited due to chronic arthritic pain.  No episodes of chest pain, lightheadedness or dizziness.  she states having an echocardiogram over 10 years ago though nothing recently.  Problems Prior to Update: 1)  Nocturia  (ICD-788.43) 2)  Asthma, Persistent, Moderate  (ICD-493.90) 3)  Acute Bronchitis  (ICD-466.0) 4)  Fatigue  (ICD-780.79) 5)  Unspecified Vitamin D Deficiency  (ICD-268.9) 6)  Foot Pain, Left  (ICD-729.5) 7)  Low Back Pain, Chronic  (ICD-724.2) 8)  Overweight/obesity  (ICD-278.02) 9)  Cardiomyopathy, Secondary  (ICD-425.9) 10)  Atrial Flutter  (ICD-427.32) 11)  Atrial Fibrillation  (ICD-427.31) 12)  Congestive Heart Failure  (ICD-428.0) 13)  Peripheral Edema  (ICD-782.3) 14)  Hyperlipidemia  (ICD-272.4) 15)  Hypertension  (ICD-401.9) 16)  Cough  (ICD-786.2) 17)  Allergic Rhinitis  (ICD-477.9) 18)  Colonic Polyps, Hyperplastic  (ICD-211.3) 19)  Diverticulosis, Colon  (ICD-562.10) 20)  Benign Neoplasm Oth&unspec Site Digestive System  (ICD-211.9) 21)  Fatigue  (ICD-780.79) 22)  Stricture, Esophageal  (ICD-530.3) 23)  Diabetes Mellitus, Type II, Borderline  (ICD-790.29) 24)  Gerd  (ICD-530.81) 25)  Osteoarthritis  (ICD-715.90) 26)  Knee Pain, Right, Chronic  (ICD-719.46) 27)  Herniated Lumbar Disc  (ICD-722.10) 28)  Hiatal Hernia  (ICD-553.3) 29)  Gynecological Examination, Routine   (ICD-V72.31) 30)  Osteoporosis  (ICD-733.00) 31)  Screening Mammogram Nec  (ICD-V76.12) 32)  Encounter For Therapeutic Drug Monitoring  (ICD-V58.83) 33)  Anticoagulation Therapy  (ICD-V58.61)  Medications Prior to Update: 1)  Cartia Xt 300 Mg  Cp24 (Diltiazem Hcl Coated Beads) .... Take 1 Tablet By Mouth Once A Day 2)  Lisinopril 40 Mg  Tabs (Lisinopril) .... Take 1 Tablet By Mouth Once A Day 3)  Furosemide 80 Mg Tabs (Furosemide) .... Take 1 Tablet By Mouth Once A Day 4)  Warfarin Sodium 5 Mg  Tabs (Warfarin Sodium) .... Take 1 Tablet By Mouth Once A Day As Directed. 5)  Nexium 40 Mg Cpdr (Esomeprazole Magnesium) .... Take 1 Tablet By Mouth Once A Day 6)  Alendronate Sodium 70 Mg  Tabs (Alendronate Sodium) .... Take 1 Tablet  By Mouth Each Week. 7)  Simvastatin 80 Mg  Tabs (Simvastatin) .... Take 1 Tablet By Mouth Once A Day 8)  Metoprolol Succinate 200 Mg Xr24h-Tab (Metoprolol Succinate) .... Take 1 Tablet By Mouth Once A Day 9)  Tessalon 200 Mg Caps (Benzonatate) .... One Tab By Mouth Three Times A Day 10)  Proair Hfa 108 (90 Base) Mcg/act  Aers (Albuterol Sulfate) .... 2 Inh Q4h As Needed Shortness of Breath 11)  Advair Diskus 250-50 Mcg/dose Aepb (Fluticasone-Salmeterol) .Marland Kitchen.. 1 Inh Two Times A Day 12)  Amitriptyline Hcl 50 Mg Tabs (Amitriptyline Hcl) .Marland Kitchen.. 1 Tab By Mouth At Bedtime  Current Medications (verified): 1)  Cartia Xt 300 Mg  Cp24 (Diltiazem Hcl Coated Beads) .... Take 1 Tablet By Mouth Once A Day 2)  Lisinopril 40 Mg  Tabs (Lisinopril) .... Take 1  Tablet By Mouth Once A Day 3)  Furosemide 80 Mg Tabs (Furosemide) .... Take 1 Tablet By Mouth Once A Day 4)  Warfarin Sodium 5 Mg  Tabs (Warfarin Sodium) .... Take 1 Tablet By Mouth Once A Day As Directed. 5)  Nexium 40 Mg Cpdr (Esomeprazole Magnesium) .... Take 1 Tablet By Mouth Once A Day 6)  Alendronate Sodium 70 Mg  Tabs (Alendronate Sodium) .... Take 1 Tablet  By Mouth Each Week. 7)  Simvastatin 80 Mg  Tabs (Simvastatin) ....  Take 1 Tablet By Mouth Once A Day 8)  Metoprolol Succinate 200 Mg Xr24h-Tab (Metoprolol Succinate) .... Take 1 Tablet By Mouth Once A Day 9)  Proair Hfa 108 (90 Base) Mcg/act  Aers (Albuterol Sulfate) .... 2 Inh Q4h As Needed Shortness of Breath 10)  Advair Diskus 250-50 Mcg/dose Aepb (Fluticasone-Salmeterol) .Marland Kitchen.. 1 Inh Two Times A Day 11)  Amitriptyline Hcl 50 Mg Tabs (Amitriptyline Hcl) .Marland Kitchen.. 1 Tab By Mouth At Bedtime 12)  Fluorometholone 0.1 % Susp (Fluorometholone) .... As Needed  Allergies (verified): No Known Drug Allergies  Past History:  Past Medical History: Last updated: 10/02/2008 OVERWEIGHT/OBESITY (ICD-278.02) CARDIOMYOPATHY, SECONDARY (ICD-425.9) ATRIAL FLUTTER (ICD-427.32) ATRIAL FIBRILLATION (ICD-427.31) CONGESTIVE HEART FAILURE (ICD-428.0) PERIPHERAL EDEMA (ICD-782.3) HYPERLIPIDEMIA (ICD-272.4) HYPERTENSION (ICD-401.9) COUGH (ICD-786.2) ALLERGIC RHINITIS (ICD-477.9) URI (ICD-465.9) OTHER DYSPHAGIA (ICD-787.29) COLONIC POLYPS, HYPERPLASTIC (ICD-211.3) DIVERTICULOSIS, COLON (ICD-562.10) BENIGN NEOPLASM OTH&UNSPEC SITE DIGESTIVE SYSTEM (ICD-211.9) FATIGUE (ICD-780.79) STRICTURE, ESOPHAGEAL (ICD-530.3) PNEUMONIA, BACTERIAL NOS (ICD-482.9) SYMPTOM, COUGH (ICD-786.2) PREDIABETES (ICD-790.29) GERD (ICD-530.81) OSTEOARTHRITIS (ICD-715.90) KNEE PAIN, RIGHT, CHRONIC (ICD-719.46) HERNIATED LUMBAR DISC (ICD-722.10) HIATAL HERNIA (ICD-553.3) ANTICOAGULATION THERAPY (ICD-V58.61)  Past Surgical History: Last updated: 07/11/2007 esophogeal stricture dilation 01/2007 Cholesystectomy 04 Ankle surgery 2001 Partial hysterectomy 1971  Family History: Last updated: 12/16/2006 father age 24s emphesema mother age 25 CAD,HTN daughter: brain cyst 2 sisters chol 2 brothers healthy 2 of 5 daughter with DM, HTN daughter with ovarian cancer 1 son with bone marrow cancer  Social History: Last updated: 12/16/2006 lost husband to cancer in 07/2006 5 children Occupation:  Runner, 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  The patient denies fever, weight loss, weight gain, vision loss, decreased hearing, hoarseness, chest pain, syncope, prolonged cough, abdominal pain, incontinence, muscle weakness, depression, and enlarged lymph nodes.    Vital Signs:  Patient profile:   77 year old female Height:      64 inches Weight:      232 pounds Pulse rate:   78 / minute BP sitting:   152 / 90  (right arm) Cuff size:   regular  Vitals Entered By: Stanton Kidney, EMT-P (Sep 02, 2009 10:54 AM)  Physical Exam  General:  obese woman in no apparent distress, HEENT exam is benign, or fractures clear, neck is supple with no JVP or carotid bruits, heart sounds are irregular with S1-S2 and no murmurs appreciated, lungs are clear to auscultation with no wheezes or rales, abdominal exam is notable for obesity the otherwise benign, at least 1+ lower extremity edema to below the knees bilaterally, neurologic exam is nonfocal, skin is warm and dry. Pulses are equal and symmetrical in her upper and lower extremities.   New Orders:     1)  Echocardiogram (Echo)   Impression & Recommendations:  Problem # 1:  ATRIAL FIBRILLATION (ICD-427.31)  chronic atrial fibrillation with rate controlled on metoprolol and diltiazem. she would like to get rid of her atrial fibrillation she has been in it chronically and sometimes it is difficult to convert her to normal sinus rhythm. The echocardiogram will help dictate if this may be possible.  Her updated medication list for this problem includes:    Warfarin Sodium 5 Mg Tabs (Warfarin sodium) .Marland Kitchen... Take 1 tablet by mouth once a day as directed.    Metoprolol Succinate 200 Mg Xr24h-tab (Metoprolol succinate) .Marland Kitchen... Take 1  tablet by mouth once a day  Orders: Echocardiogram (Echo)  Problem # 2:  HYPERTENSION (ICD-401.9) Blood pressure is elevated today and we have suggested that she monitor her pressure at home. She states that she has a blood pressure cuff.   Her updated medication list for this problem includes:    Cartia Xt 300 Mg Cp24 (Diltiazem hcl coated beads) .Marland Kitchen... Take 1 tablet by mouth once a day    Lisinopril 40 Mg Tabs (Lisinopril) .Marland Kitchen... Take 1 tablet by mouth once a day    Furosemide 80 Mg Tabs (Furosemide) .Marland Kitchen... Take 1 tablet by mouth once a day    Metoprolol Succinate 200 Mg Xr24h-tab (Metoprolol succinate) .Marland Kitchen... Take 1 tablet by mouth once a day  Problem # 3:  PERIPHERAL EDEMA (ICD-782.3) Etiology of her chronic lower extremity edema is uncertain. This may be due to pulmonary hypertension, diastolic dysfunction or due to chronic venous insufficiency. The echocardiogram will help dictate how useful a diuretic regimen will be.  Orders: Echocardiogram (Echo)  Patient Instructions: 1)  Your physician recommends that you schedule a follow-up appointment in: 6 months 2)  Your physician has requested that you have an echocardiogram.  Echocardiography is a painless test that uses sound waves to create images of your heart. It provides your doctor with information about the size and shape of your heart and how well your heart's chambers and valves are working.  This procedure takes approximately one hour. There are no restrictions for this procedure.

## 2010-05-29 NOTE — Progress Notes (Signed)
Summary: wants refill on cough medicine  Phone Note Refill Request Call back at Work Phone [phone] Message from:  Patient  Refills Requested: Medication #1:  hydrocodone cough syrup This is no longer on med list.  [facility] [place].  She says this helps her to sleep, calms her cough.  Also, she is asking if she needs to keep follow up appt for friday.  She says she will if you think it's necessary, otherwise she wants to cancel due to the cost of gas.  Initial call taken by: Lowella Petties CMA,  August 21, 2009 3:36 PM  Follow-up for Phone Call        Please call pt ..is amitryptiline helping with energy, mood, sleep?  Follow-up by: Kerby Nora MD,  August 21, 2009 4:49 PM  Additional Follow-up for Phone Call Additional follow up Details #1::        she says that she sleeps good but, she still gets up hurting in legs and back. Patient still doesnt have any energy Additional Follow-up by: Benny Lennert CMA Duncan Dull),  August 21, 2009 4:51 PM    Additional Follow-up for Phone Call Additional follow up Details #2::    Instead of narcotic cough syrup, I would recommend increasing amitryptiline to 50 mg at bedtime.  Should help with mood, body ache and sleep.  For cough..increase advair to 250/50 two times a day to treat asthma, inadequate control. Okay to move appt back to 1-32months from now.  Follow-up by: Kerby Nora MD,  August 21, 2009 4:56 PM  Additional Follow-up for Phone Call Additional follow up Details #3:: Details for Additional Follow-up Action Taken: Patient advised.Consuello Masse CMA  Additional Follow-up by: Benny Lennert CMA Duncan Dull),  August 22, 2009 8:36 AM  New/Updated Medications: ADVAIR DISKUS 250-50 MCG/DOSE AEPB (FLUTICASONE-SALMETEROL) 1 inh two times a day AMITRIPTYLINE HCL 50 MG TABS (AMITRIPTYLINE HCL) 1 tab by mouth at bedtime Prescriptions: ADVAIR DISKUS 250-50 MCG/DOSE AEPB (FLUTICASONE-SALMETEROL) 1 inh two times a day  #1 x 11   Entered  and Authorized by:   Kerby Nora MD   Signed by:   Kerby Nora MD on 08/21/2009   Method used:   Electronically to        [id number] (retail)       [address]       [place], [place]  [address]       Ph: [phone] or [phone]       Fax: [phone]   RxID:   [id number] AMITRIPTYLINE HCL 50 MG TABS (AMITRIPTYLINE HCL) 1 tab by mouth at bedtime  #30 x 11   Entered and Authorized by:   Kerby Nora MD   Signed by:   Kerby Nora MD on 08/21/2009   Method used:   Electronically to        [id number] (retail)       [address]       [place], [place]  [address]       Ph: [phone] or [phone]       Fax: [phone]   RxID:   [id number]

## 2010-05-29 NOTE — Progress Notes (Signed)
Summary: cardiologist changed medicine  Phone Note Call from Patient   Caller: Patient Call For: Kerby Nora MD Summary of Call: Pt jsut wanted you to be aware that cardiologist took her off ditiazema and put her on clonidine 0.1 mg twice a day.              Lowella Petties CMA, AAMA  March 18, 2010 8:58 AM   Follow-up for Phone Call        Wren med list to reflect if not already done.  Follow-up by: Kerby Nora MD,  March 19, 2010 12:29 AM  Additional Follow-up for Phone Call Additional follow up Details #1::        already done.Consuello Masse CMA   Additional Follow-up by: Benny Lennert CMA Duncan Dull),  March 19, 2010 7:29 AM     Prior Medications: LISINOPRIL 40 MG  TABS (LISINOPRIL) Take 1 tablet by mouth once a day FUROSEMIDE 80 MG TABS (FUROSEMIDE) Take 1 tablet by mouth once a day WARFARIN SODIUM 5 MG  TABS (WARFARIN SODIUM) Take 1 tablet by mouth once a day as directed. PRILOSEC 20 MG CPDR (OMEPRAZOLE) 2 tab  po daily ALENDRONATE SODIUM 70 MG  TABS (ALENDRONATE SODIUM) Take 1 tablet  by mouth each week. LIPITOR 80 MG TABS (ATORVASTATIN CALCIUM) Take 1 tablet by mouth once a day METOPROLOL SUCCINATE 200 MG XR24H-TAB (METOPROLOL SUCCINATE) Take 1 tablet by mouth once a day PROAIR HFA 108 (90 BASE) MCG/ACT  AERS (ALBUTEROL SULFATE) 2 inh q4h as needed shortness of breath ADVAIR DISKUS 250-50 MCG/DOSE AEPB (FLUTICASONE-SALMETEROL) 1 inh two times a day FLUOROMETHOLONE 0.1 % SUSP (FLUOROMETHOLONE) as needed TESSALON PERLES 100 MG  CAPS (BENZONATATE) 1 by mouth three times a day as needed cough ALLEGRA ALLERGY 60 MG TABS (FEXOFENADINE HCL) 1 tab by mouth two times a day as needed allergies HYDROCODONE-ACETAMINOPHEN 5-500 MG TABS (HYDROCODONE-ACETAMINOPHEN) 1 tab by mouth daily as needed pain CLONIDINE HCL 0.1 MG TABS (CLONIDINE HCL) Take 1 tablet by mouth two times a day Current Allergies: No known allergies

## 2010-05-29 NOTE — Progress Notes (Signed)
Summary: Rx Cheratussin  Phone Note From Pharmacy Call back at [phone]   [facility]  [address] [phone] * Call For: Dr. Ermalene Searing  Summary of Call: Received fax from pharmacy requesting a refill for Cheratussin AC Syrup.  This medication is no longer on her medication list and I saw in the last phone note where Dr. Ermalene Searing did not refill this medication.  Please advise.   Initial call taken by: Linde Gillis CMA Duncan Dull),  Sep 03, 2009 10:57 AM  Follow-up for Phone Call        Do not fill. Treating cough with asthma treatment.  Follow-up by: Kerby Nora MD,  Sep 03, 2009 12:32 PM  Additional Follow-up for Phone Call Additional follow up Details #1::        patient advised.Consuello Masse CMA  Additional Follow-up by: Benny Lennert CMA Duncan Dull),  Sep 03, 2009 12:39 PM

## 2010-05-29 NOTE — Progress Notes (Signed)
Summary: needs advair called in  Phone Note Call from Patient   Caller: Patient Call For: Kerby Nora MD Summary of Call: Pt checked with her insurance and didnt find anything less expensive so she has agreed to get the advair and proair.  The proair has been called in but not the advair.  Please send to [facility]. Initial call taken by: Lowella Petties CMA,  July 09, 2009 8:50 AM  Follow-up for Phone Call        Checked with pharmacy and they do have the advair and was filled on the 14th of march Follow-up by: Benny Lennert CMA Duncan Dull),  July 09, 2009 9:22 AM

## 2010-05-29 NOTE — Progress Notes (Signed)
Summary: possible drug interaction  Phone Note Call from Patient   Caller: Patient Summary of Call: Pt states her pharmacist told her she should not be taking diltiazem and simvastatin due to interaction. Do you want to change her to something else?   [facility] [address]. Initial call taken by: Lowella Petties CMA,  January 16, 2010 10:17 AM  Follow-up for Phone Call        on zocor 80 - ca channel blockers can increase blood level. this is a reasonable thought Hannah Beat MD  January 16, 2010 5:56 PM   Additional Follow-up for Phone Call Additional follow up Details #1::        These are recent recommendations to avoid high doese simvastain with Ca channel blockers... she is only on this to improve affordability...will change her to Lipitor 80 which will go generic in 03/2010...give her coupon to assist if we have any.     Additional Follow-up for Phone Call Additional follow up Details #2::    Patient advised and will call if this not affordable.Consuello Masse CMA   Follow-up by: Benny Lennert CMA Duncan Dull),  January 17, 2010 8:43 AM  New/Updated Medications: LIPITOR 80 MG TABS (ATORVASTATIN CALCIUM) Take 1 tablet by mouth once a day Prescriptions: LIPITOR 80 MG TABS (ATORVASTATIN CALCIUM) Take 1 tablet by mouth once a day  #30 x 11   Entered and Authorized by:   Kerby Nora MD   Signed by:   Kerby Nora MD on 01/16/2010   Method used:   [facility]        [facility]  [address] [phone]* (retail)       [address]       [place], [place]  [address]       Ph: [phone] or [phone]       Fax: [phone]   RxID:   [id number]

## 2010-05-29 NOTE — Progress Notes (Signed)
Summary: RESULTS   Phone Note Call from Patient Call back at Home Phone [phone]   Caller: SELF Call For: [facility] Summary of Call: PT WOULD LIKE A CALL BACK ASAP ABOUT HER ECHO RESULTS-SHE WAS TOLD THAT SHE WOULD BE CALLED ABOUT HER RESULTS SO THAT SHE WOULD NOT NEED TO COME IN FOR HER APPT TODAY Initial call taken by: Harlon Flor,  September 26, 2009 11:14 AM  Follow-up for Phone Call        Dr. Mariah Milling spoke to pt over the phone.  Appointment for today cancelled  Follow-up by: Benedict Needy, RN,  September 26, 2009 2:24 PM

## 2010-05-29 NOTE — Medication Information (Signed)
Summary: Coverage Approval for Singulair  Coverage Approval for Singulair   Imported By: Maryln Gottron 07/31/2009 14:09:26  _____________________________________________________________________  External Attachment:    Type:   Image     Comment:   External Document

## 2010-05-29 NOTE — Progress Notes (Signed)
Summary: needs inhalers called in  Phone Note Call from Patient   Caller: Patient Summary of Call: Spoke with pt, gave her results of PFT.  I explained to her that we were going to call in 2 inhalers for her and tried to explain to her how to use these.  [facility] [place].  She was driving when she called me and didnt really understand what I was telling her. I asked her to call us back after she gets these meds and is at a place where she can write the instructions down and we will explain to her again.  She also needs to be scheduled for a follow up appt.  Please give directions and quantity on inhalers to be called in. Initial call taken by: Lowella Petties CMA,  July 03, 2009 1:37 PM  Follow-up for Phone Call        Advair is reasonable. I will let Dr. B decide which dosing she would like to use in this case.  OK to call in albuterol, pharmacy of choice  f/u with Dr. Ermalene Searing Follow-up by: Hannah Beat MD,  July 03, 2009 1:42 PM  Additional Follow-up for Phone Call Additional follow up Details #1::        [facility]air [facility] [place].  I told the pharmacist to let the pt know we will be calling in the advair later and not to use the proair until she calls for instructions. Additional Follow-up by: Lowella Petties CMA,  July 03, 2009 2:55 PM    New/Updated Medications: PROAIR HFA 108 (90 BASE) MCG/ACT  AERS (ALBUTEROL SULFATE) 2 inh q4h as needed shortness of breath Prescriptions: PROAIR HFA 108 (90 BASE) MCG/ACT  AERS (ALBUTEROL SULFATE) 2 inh q4h as needed shortness of breath  #1 x 3   Entered and Authorized by:   Hannah Beat MD   Signed by:   Hannah Beat MD on 07/03/2009   Method used:   [facility]phoned to ...       [facility]  [address] [id number] ([facility]       [address]       [place], [place]  [address]       Ph: [phone] or [phone]       Fax: [phone]   RxID:   [id number]

## 2010-05-29 NOTE — Progress Notes (Signed)
Summary: dry cough  Phone Note Call from Patient Call back at [phone] cell   Caller: Patient Call For: Angela Nora MD Summary of Call: Pt seen on 04/23/09 for bronchitis. Pt finished Zpak but still has dry hacky cough. Drainage at back of throat. No sorethroat, no fever, no wheezing or trouble breathing. Pt cannot take Cheratussin Ac because it caused nausea and vomiting and dizziness. Please advise. [phone]. Initial call taken by: Lewanda Rife LPN,  May 10, 2009 9:14 AM  Follow-up for Phone Call        Cough can take up to 3-4 weeks to resolve...can use benzonatate she was given in past...works better for dry cough.  if still not improving, we can try course of prednisone for post infectious bronchospasm. Follow-up by: Angela Nora MD,  May 10, 2009 9:18 AM  Additional Follow-up for Phone Call Additional follow up Details #1::        Spoke with patient.  She says she can take Cheratussin without any problem and has some at home.  The cough syrup with Codeine in it is what makes her sick.   Wanted to know if something can be called in to dry her up.  She continues to cough because of the drainage down her throat.   Additional Follow-up by: Linde Gillis CMA Duncan Dull),  May 10, 2009 2:22 PM    Additional Follow-up for Phone Call Additional follow up Details #2::    Will send in cheratussin..but this has codeine in it.  Decongestants to dry her up are contraindicated given her high BP.   New/Updated Medications: CHERATUSSIN AC 100-10 MG/5ML SYRP (GUAIFENESIN-CODEINE) 1-2 tsp by mouth at bedtime as needed cough Prescriptions: CHERATUSSIN AC 100-10 MG/5ML SYRP (GUAIFENESIN-CODEINE) 1-2 tsp by mouth at bedtime as needed cough  #8 oz x 0   Entered and Authorized by:   Angela Nora MD   Signed by:   Angela Nora MD on 05/10/2009   Method used:   Telephoned to ...       [facility]  [address] [id number] ([facility]ail)       [address]       [place], [place]  [address]       Ph: [phone] or [phone]       Fax: [phone]   RxID:   [id number]   Appended Document: dry cough Rx called to pharmacy, patient notified

## 2010-05-29 NOTE — Assessment & Plan Note (Signed)
Summary: SICK//VGJ   Vital Signs:  Patient profile:   77 year old female Weight:      233 pounds Temp:     99.0 degrees F oral Pulse rate:   89 / minute BP sitting:   144 / 92  (left arm) Cuff size:   large  Vitals Entered By: Alfred Levins, CMA (June 08, 2009 1:40 PM) CC: cough, congestion, sinus pressure x4 days   History of Present Illness: Pt here for congestion which began after being seen by Dr Ermalene Searing. She started with burning in the throat and taking her breath away. Her side and stomach is very sore.  She has not had fever, she has chills occas, headache a few days ago but went away not to come back again, no ear pain, but ear popping, some rhinitis tha t is white, some ST now gone, cough proiductive of yellow sputum., SOB with coughing last night. No N/V.,  Problems Prior to Update: 1)  Acute Bronchitis  (ICD-466.0) 2)  Fatigue  (ICD-780.79) 3)  Unspecified Vitamin D Deficiency  (ICD-268.9) 4)  Foot Pain, Left  (ICD-729.5) 5)  Low Back Pain, Chronic  (ICD-724.2) 6)  Overweight/obesity  (ICD-278.02) 7)  Cardiomyopathy, Secondary  (ICD-425.9) 8)  Atrial Flutter  (ICD-427.32) 9)  Atrial Fibrillation  (ICD-427.31) 10)  Congestive Heart Failure  (ICD-428.0) 11)  Peripheral Edema  (ICD-782.3) 12)  Hyperlipidemia  (ICD-272.4) 13)  Hypertension  (ICD-401.9) 14)  Cough  (ICD-786.2) 15)  Allergic Rhinitis  (ICD-477.9) 16)  Colonic Polyps, Hyperplastic  (ICD-211.3) 17)  Diverticulosis, Colon  (ICD-562.10) 18)  Benign Neoplasm Oth&unspec Site Digestive System  (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.9) 19)  Fatigue  (ICD-780.79) 20)  Stricture, Esophageal  (ICD-530.3) 21)  Diabetes Mellitus, Type II, Borderline  (ICD-790.29) 22)  Gerd  (ICD-530.81) 23)  Osteoarthritis  (ICD-715.90) 24)  Knee Pain, Right, Chronic  (ICD-719.46) 25)  Herniated Lumbar Disc  (ICD-722.10) 26)  Hiatal Hernia  (ICD-553.3) 27)  Gynecological Examination, Routine  (ICD-V72.31) 28)  Osteoporosis  (ICD-733.00) 29)  Screening  Mammogram Nec  (ICD-V76.12) 30)  Encounter For Therapeutic Drug Monitoring  (ICD-V58.83) 31)  Anticoagulation Therapy  (ICD-V58.61)  Medications Prior to Update: 1)  Cartia Xt 300 Mg  Cp24 (Diltiazem Hcl Coated Beads) .... Take 1 Tablet By Mouth Once A Day 2)  Lisinopril 40 Mg  Tabs (Lisinopril) .... Take 1 Tablet By Mouth Once A Day 3)  Furosemide 80 Mg Tabs (Furosemide) .... Take 1 Tablet By Mouth Once A Day 4)  Warfarin Sodium 5 Mg  Tabs (Warfarin Sodium) .... Take 1 Tablet By Mouth Once A Day As Directed. 5)  Nexium 40 Mg Cpdr (Esomeprazole Magnesium) .... Take 1 Tablet By Mouth Once A Day 6)  Alendronate Sodium 70 Mg  Tabs (Alendronate Sodium) .... Take 1 Tablet  By Mouth Each Week. 7)  Simvastatin 80 Mg  Tabs (Simvastatin) .... Take 1 Tablet By Mouth Once A Day 8)  Metoprolol Succinate 200 Mg Xr24h-Tab (Metoprolol Succinate) .... Take 1 Tablet By Mouth Once A Day 9)  Cheratussin Ac 100-10 Mg/53ml Syrp (Guaifenesin-Codeine) .Marland Kitchen.. 1-2 Tsp By Mouth At Bedtime As Needed Cough 10)  Singulair 10 Mg Tabs (Montelukast Sodium) .Marland Kitchen.. 1 Tab By Mouth At Bedtime  Allergies: No Known Drug Allergies  Physical Exam  General:  obese appearing elderly female in NAD Head:  Normocephalic and atraumatic without obvious abnormalities. No apparent alopecia or balding. Sinuses NT. Eyes:  Conjunctiva clear bilaterally.  Ears:  External ear exam shows no significant lesions or deformities.  Otoscopic examination reveals clear canals, tympanic membranes are intact bilaterally without bulging, retraction, inflammation or discharge. Hearing is grossly normal bilaterally. Nose:  External nasal examination shows no deformity or inflammation. Nasal mucosa are pink and moist without lesions or exudates. Mild congestion of the nares. Mouth:  MMM, minimal clear post nasal drip Neck:  no carotid bruit or thyromegaly no cervical or supraclavicular lymphadenopathy  Chest Wall:  No deformities, masses, or tenderness  noted. Lungs:  Normal respiratory effort, chest expands symmetrically. Lungs are clear to auscultation, no crackles or wheezes. Heart:  Normal rate and regular rhythm. S1 and S2 normal without gallop, murmur, click, rub or other extra sounds.   Impression & Recommendations:  Problem # 1:  URI (ICD-465.9) Assessment New See instructions. Her updated medication list for this problem includes:    Cheratussin Ac 100-10 Mg/77ml Syrp (Guaifenesin-codeine) .Marland Kitchen... 1-2 tsp by mouth at bedtime as needed cough    Tessalon 200 Mg Caps (Benzonatate) ..... One tab by mouth three times a day    Tussionex Pennkinetic Er 8-10 Mg/77ml Lqcr (Chlorpheniramine-hydrocodone) ..... One tsp by mouth at night as needed cough.  Complete Medication List: 1)  Cartia Xt 300 Mg Cp24 (Diltiazem hcl coated beads) .... Take 1 tablet by mouth once a day 2)  Lisinopril 40 Mg Tabs (Lisinopril) .... Take 1 tablet by mouth once a day 3)  Furosemide 80 Mg Tabs (Furosemide) .... Take 1 tablet by mouth once a day 4)  Warfarin Sodium 5 Mg Tabs (Warfarin sodium) .... Take 1 tablet by mouth once a day as directed. 5)  Nexium 40 Mg Cpdr (Esomeprazole magnesium) .... Take 1 tablet by mouth once a day 6)  Alendronate Sodium 70 Mg Tabs (Alendronate sodium) .... Take 1 tablet  by mouth each week. 7)  Simvastatin 80 Mg Tabs (Simvastatin) .... Take 1 tablet by mouth once a day 8)  Metoprolol Succinate 200 Mg Xr24h-tab (Metoprolol succinate) .... Take 1 tablet by mouth once a day 9)  Cheratussin Ac 100-10 Mg/51ml Syrp (Guaifenesin-codeine) .Marland Kitchen.. 1-2 tsp by mouth at bedtime as needed cough 10)  Singulair 10 Mg Tabs (Montelukast sodium) .Marland Kitchen.. 1 tab by mouth at bedtime 11)  Tessalon 200 Mg Caps (Benzonatate) .... One tab by mouth three times a day 12)  Tussionex Pennkinetic Er 8-10 Mg/33ml Lqcr (Chlorpheniramine-hydrocodone) .... One tsp by mouth at night as needed cough.  Patient Instructions: 1)  Take Guaifenesin by [facility] or [facility] and getting MUCOUS RELIEF EXPECTORANT (400mg ), take 11/2 tabs by mouth AM and NOON. 2)  Drink lots of fluids anytime taking Guaifenesin.  3)  Keep lozenge in mouth. 4)  Tessalon three times a day for cough. Prescriptions: TUSSIONEX PENNKINETIC ER 8-10 MG/5ML LQCR (CHLORPHENIRAMINE-HYDROCODONE) one tsp by mouth at night as needed cough.  #8 oz x 0   Entered and Authorized by:   Shaune Leeks MD   Signed by:   Shaune Leeks MD on 06/08/2009   Method used:   Print then Give to Patient   RxID:   [id number] TESSALON 200 MG CAPS (BENZONATATE) one tab by mouth three times a day  #30 x 0   Entered and Authorized by:   Shaune Leeks MD   Signed by:   Shaune Leeks MD on 06/08/2009   Method used:   [facility]        [facility]  [address] [id number]       [address]  [place], [place]  [address]       Ph: [phone] or [phone]       Fax: [phone]   RxID:   [id number]   Appended Document: SICK//VGJ Would check BP at next visit when seen.

## 2010-05-29 NOTE — Progress Notes (Signed)
Summary: Patient fell  Phone Note Call from Patient Call back at Work Phone [phone]   Caller: Patient Call For: Angela Nora MD Summary of Call: VOICEMAIL FROM TRIAGE: pt states she feel in the kitchen yesterday on her left side, she is sore this morning a little, what can she take or use? Initial call taken by: Mervin Hack CMA Duncan Dull),  Sep 09, 2009 9:01 AM  Follow-up for Phone Call        Tylenol for pain...make sure she did not hit head or excessive bleeding as she is on coumadin.  Also make sure it was accidental fall...not related to any CP, dizzyness symptoms. Follow-up by: Angela Nora MD,  Sep 09, 2009 9:14 AM  Additional Follow-up for Phone Call Additional follow up Details #1::        Spoke with pt.  The fell accidentally when she tripped over her shoe, which was too big.  She didnt have any bleeding, hit her head lightly on a wooden door.  Only problem she is having is that she has some soreness.   Also, she may try otc prilosec instead of taking nexium because she says the nexium is too expensive.  She will see which one works better for her. Additional Follow-up by: Lowella Petties CMA,  Sep 09, 2009 9:54 AM

## 2010-05-29 NOTE — Progress Notes (Signed)
Summary: inhalers are too costly  Phone Note Call from Patient Call back at Work Phone [phone]   Caller: Patient Summary of Call: Pt states the inhalers that were call in are too expensive for her.  She is asking if something less costly can be called to [facility]- something that works just as well. Initial call taken by: Lowella Petties CMA,  July 08, 2009 9:30 AM  Follow-up for Phone Call        Have her call her insurance to find out what is on her formulary for asthma..then let me know. There are no generics for these.  Follow-up by: Kerby Nora MD,  July 08, 2009 11:54 AM  Additional Follow-up for Phone Call Additional follow up Details #1::        Patient advised and will call back with asthma medications that are covered.Consuello Masse CMA  Additional Follow-up by: Benny Lennert CMA Duncan Dull),  July 08, 2009 2:27 PM

## 2010-05-29 NOTE — Progress Notes (Signed)
Summary: pt has diarrhea  Phone Note Call from Patient Call back at [facility] Phone [phone]   Caller: Patient Call For: Kerby Nora MD Summary of Call: Pt has had loose stools, diarrhea for 3 days.  She has little control over this.  No severe abd pain, no nausea or fever.  Advised BRAT diet, fluids, no dairy products.  She is asking if the lipitor, that she started in november, could be causing this, but she hasnt had any since last week.   Initial call taken by: Lowella Petties CMA, AAMA,  April 04, 2010 3:07 PM  Follow-up for Phone Call        No should not be the lipitor. if diarrhea not improving  by early next week, make appt to be seen.  Follow-up by: Kerby Nora MD,  April 04, 2010 4:38 PM  Additional Follow-up for Phone Call Additional follow up Details #1::        Phone Call Completed Additional Follow-up by: Benny Lennert CMA Duncan Dull),  April 04, 2010 4:41 PM

## 2010-05-29 NOTE — Progress Notes (Signed)
Summary: prior auth given for singulair  Phone Note Other Incoming   Summary of Call: Prior auth given for [employer], pt and pharmacy notified.               Lowella Petties CMA  July 25, 2009 1:18 PM      Appended Document: prior auth given for singulair Prior authorization good until 07/25/2011.  Singulair 10mg .

## 2010-05-29 NOTE — Progress Notes (Signed)
Summary: cough med not helping  Phone Note Call from Patient Call back at Home Phone [phone]   Caller: Patient Call For: Kerby Nora MD Summary of Call: Pt is taking hydrocodone for her cough  but she says this isnt helping.  She is only taking this at night.  She is asking what else she can do.  [facility] [place]  She finished her z-pack on saturday. Initial call taken by: Lowella Petties CMA,  April 29, 2009 4:54 PM  Follow-up for Phone Call        If not improving after antibitoics...recommend CXR to eval for pneumonia.  use mucinex  DM maximum strength during day Follow-up by: Kerby Nora MD,  April 29, 2009 5:21 PM  Additional Follow-up for Phone Call Additional follow up Details #1::        patient advised and wants to do x-ray Additional Follow-up by: Benny Lennert CMA Duncan Dull),  April 29, 2009 5:27 PM

## 2010-05-29 NOTE — Progress Notes (Signed)
Summary: nexium  Phone Note Call from Patient Call back at Home Phone [phone]   Caller: Patient Call For: Kerby Nora MD Summary of Call: Patient has been taking nexium otc. She is asking if she can get rx for it so that she can get it cheaper. [facility] [address]  Initial call taken by: [name],  May 19, 2010 1:57 PM  Follow-up for Phone Call        I don't believe nexium is over the counter.. please call to find oput what she is taking... omeprazole? prevacid? Follow-up by: Kerby Nora MD,  May 19, 2010 10:43 PM    New/Updated Medications: NEXIUM 40 MG CPDR (ESOMEPRAZOLE MAGNESIUM) one tablet daily Prescriptions: NEXIUM 40 MG CPDR (ESOMEPRAZOLE MAGNESIUM) one tablet daily  #30 x 11   Entered by:   Benny Lennert CMA (AAMA)   Authorized by:   Kerby Nora MD   Signed by:   Benny Lennert CMA (AAMA) on 05/20/2010   Method used:   [facility]        [facility]  [address] [phone][facility] (retail)       [address]       [place], [place]  [id number]       Ph: [phone] or [phone]       Fax: [phone]   RxID:   [id number]   Prior Medications: LISINOPRIL 40 MG  TABS (LISINOPRIL) Take 1 tablet by mouth once a day FUROSEMIDE 80 MG TABS (FUROSEMIDE) Take 1 tablet by mouth once a day WARFARIN SODIUM 5 MG  TABS (WARFARIN SODIUM) Take 1 tablet by mouth once a day as directed. ALENDRONATE SODIUM 70 MG  TABS (ALENDRONATE SODIUM) Take 1 tablet  by mouth each week. LIPITOR 80 MG TABS (ATORVASTATIN CALCIUM) Take 1 tablet by mouth once a day METOPROLOL SUCCINATE 200 MG XR24H-TAB (METOPROLOL SUCCINATE) Take 1 tablet by mouth once a day PROAIR HFA 108 (90 BASE) MCG/ACT  AERS (ALBUTEROL SULFATE) 2 inh q4h as needed shortness of breath ADVAIR DISKUS 250-50 MCG/DOSE AEPB (FLUTICASONE-SALMETEROL) 1 inh two times a day FLUOROMETHOLONE 0.1 % SUSP (FLUOROMETHOLONE) as needed TESSALON PERLES 100 MG  CAPS (BENZONATATE) 1 by  mouth three times a day as needed cough ALLEGRA ALLERGY 60 MG TABS (FEXOFENADINE HCL) 1 tab by mouth two times a day as needed allergies HYDROCODONE-ACETAMINOPHEN 5-500 MG TABS (HYDROCODONE-ACETAMINOPHEN) 1 tab by mouth daily as needed pain CLONIDINE HCL 0.2 MG TABS (CLONIDINE HCL) Take one tablet by mouth twice a day DIGOXIN 0.125 MG TABS (DIGOXIN) Take one tablet by mouth daily Current Allergies: No known allergies  Appended Document: nexium patient wants rx for nexium b/c it will be cheaper then then prevacid and and it doesnt work as well.Consuello Masse CMA

## 2010-06-04 ENCOUNTER — Telehealth: Payer: Self-pay | Admitting: Family Medicine

## 2010-06-04 ENCOUNTER — Ambulatory Visit: Payer: 59 | Admitting: Family Medicine

## 2010-06-04 NOTE — Progress Notes (Signed)
Summary: wants something for cough  Phone Note Call from Patient Call back at Home Phone [phone]   Caller: Patient Call For: Kerby Nora MD Summary of Call: Patient says that she has a very bad cough which is worse at night. She ia asking if she can [facility] [address]. Initial call taken by: [name],  May 27, 2010 4:52 PM  Follow-up for Phone Call        make sure she is using the tessalon three times a day and the inhalers as instructed.  if this isn't helping, then try otc delsym.  thanks.   Follow-up by: Crawford Givens MD,  May 27, 2010 4:58 PM  Additional Follow-up for Phone Call Additional follow up Details #1::        Left message on machine for patient to call aback.  Sydell Axon LPN  May 28, 2010 10:24 AM  Patient Advised.  Patient has appt. tomorrow for recheck and PT/INR.   Additional Follow-up by: [name] CMA (AAMA),  May 28, 2010 10:35 AM

## 2010-06-04 NOTE — Progress Notes (Signed)
Summary: not any better  Phone Note Call from Patient Call back at Home Phone [phone]   Caller: Patient Summary of Call: Pt was seen on friday, she is not any better.  Has cough, wheezing, feels terrible, sounds panicky.  Says she hurts all over, cant sleep, coughs but cant get anything up. Says she cant do anything.  Wants an antibiotic.  [facility] [address]. Please call  pt and let her know what to do ASAP. Initial call taken by: Lowella Petties CMA, AAMA,  May 26, 2010 8:56 AM  Follow-up for Phone Call        If she is significantly SOB, then she needs to be seen at ER via 911.  O/w she can start zmax today.  She'll need to cut her dose of coumadin/warfarin to 2.5mg  a day (please review the dose change with her) and then schedule her for follow up in 2 days. If she would like to be seen sooner, please arrange this.  thanks.  Follow-up by: Crawford Givens MD,  May 26, 2010 9:28 AM  Additional Follow-up for Phone Call Additional follow up Details #1::        Patient Advised. Appointment scheduled  05/29/2010 at 10:15 a.m.  [name] CMA (AAMA)  May 26, 2010 9:54 AM     New/Updated Medications: ZITHROMAX 250 MG TABS (AZITHROMYCIN) 2 by mouth today and then 1 by mouth once daily for 4 days. Prescriptions: ZITHROMAX 250 MG TABS (AZITHROMYCIN) 2 by mouth today and then 1 by mouth once daily for 4 days.  #6 x 0   Entered and Authorized by:   Crawford Givens MD   Signed by:   Crawford Givens MD on 05/26/2010   Method used:   [facility]        [facility] [address] [phone]* (retail)       [address]       [place], [place]  [id number]       Ph: [phone] or [phone]       Fax: [phone]   RxID:   [id number]

## 2010-06-04 NOTE — Assessment & Plan Note (Signed)
Summary: Follow up   Vital Signs:  Patient profile:   77 year old female Height:      64 inches Weight:      224 pounds BMI:     38.59 O2 Sat:      98 % on Room air Temp:     98.5 degrees F oral Pulse rate:   80 / minute Pulse rhythm:   regular BP sitting:   138 / 82  (left arm) Cuff size:   regular  Vitals Entered By: [name] CMA Suriya Kovarik Dull) (May 29, 2010 10:17 AM)  O2 Flow:  Room air CC: F/U per GSD.   INR recorded - needs adjustment.   History of Present Illness: Cough and some sputum production- yellow. Trouble sleeping from the cough. Stuffy and nasal congestion, worse supine.  No fevers but occ chills. Using the advair and albuterol.  Some wheeze.  INR reviewed with patient.   Allergies: No Known Drug Allergies  Review of Systems       See HPI.  Otherwise negative.    Physical Exam  General:  GEN: nad, alert and oriented HEENT: mucous membranes moist, tm wnl x2, sinuses not tender to palpation, OP wnl  NECK: supple w/o LA CV: IRR not tachy PULM: ctab, no inc wob EXT: no edema SKIN: no acute rash    Impression & Recommendations:  Problem # 1:  COUGH (ICD-786.2) No change in meds.  Lungs CTAB.   coumadin 1/2 tab today and tomorrow, 1 tab Sat-Monday, 1/2 on Tuesday, 1 on Wed and 1/2 on Thursday.   I d/w patient [id number] changes for sleep- getting propped up to sleep.  Can use plain mucinex and then follow up next week for INR.  she agrees.  Nontoxic and okay for outpatient follow up.   Complete Medication List: 1)  Lisinopril 40 Mg Tabs (Lisinopril) .... Take 1 tablet by mouth once a day 2)  Furosemide 80 Mg Tabs (Furosemide) .... Take 1 tablet by mouth once a day 3)  Warfarin Sodium 5 Mg Tabs (Warfarin sodium) .... Take 1 tablet by mouth once a day as directed. 4)  Alendronate Sodium 70 Mg Tabs (Alendronate sodium) .... Take 1 tablet  by mouth each week. 5)  Lipitor 80 Mg Tabs (Atorvastatin calcium) .... Take 1 tablet by mouth once a day 6)   Metoprolol Succinate 200 Mg Xr24h-tab (Metoprolol succinate) .... Take 1 tablet by mouth once a day 7)  Ventolin Hfa 108 (90 Base) Mcg/act Aers (Albuterol sulfate) .Marland Kitchen.. 1-2 puffs every 4 hours as needed for cough or wheeze 8)  Advair Diskus 250-50 Mcg/dose Aepb (Fluticasone-salmeterol) .Marland Kitchen.. 1 inh two times a day, rinse after use 9)  Fluorometholone 0.1 % Susp (Fluorometholone) .... As needed 10)  Tessalon Perles 100 Mg Caps (Benzonatate) .Marland Kitchen.. 1 by mouth three times a day as needed cough 11)  Allegra Allergy 60 Mg Tabs (Fexofenadine hcl) .Marland Kitchen.. 1 tab by mouth two times a day as needed allergies 12)  Hydrocodone-acetaminophen 5-500 Mg Tabs (Hydrocodone-acetaminophen) .... 1/2-1 tab by mouth daily as needed pain 13)  Clonidine Hcl 0.2 Mg Tabs (Clonidine hcl) .... Take one tablet by mouth twice a day 14)  Digoxin 0.125 Mg Tabs (Digoxin) .... Take one tablet by mouth daily 15)  Nexium 40 Mg Cpdr (Esomeprazole magnesium) .... One tablet daily 16)  Flonase 50 Mcg/act Susp (Fluticasone propionate) .... 2 sprays per nostril per day. 17)  Zithromax 250 Mg Tabs (Azithromycin) .... 2 by mouth today and then 1  by mouth once daily for 4 days.  Other Orders: Protime ([id number])  Patient Instructions: 1)  Come back for a visit next week, either Wednesday or Thursday.  2)  Keep using your inhalers and nose spray. 3)  You can talk plain mucinex for the cough. 4)  Drink plenty of fluids. 5)  Coumadin dose: don't take the coumadin before you come in on the day of your appointment  6)  Thursday 1/2 tab 7)  Friday 1/2 tab 8)  Saturday 1 tab 9)  Sunday 1 tab 10)  Monday 1 tab 11)  Tuesday 1/2 tab 12)  Wednesday 1 tab 13)  Thursday 1/2 tab   Orders Added: 1)  Protime [[id number]] 2)  Est. Patient Level III [63875]     ANTICOAGULATION RECORD PREVIOUS REGIMEN & LAB RESULTS Anticoagulation Diagnosis:  Atrial fibrillation on  08/13/2009 Previous INR Goal Range:  2.0-3.5 on  10/19/2007 Previous INR:   2.8 on  05/19/2010 Previous Coumadin Dose(mg):  5 mg daily, 2.5 mg t, th on  05/05/2010 Previous Regimen:  5 mg daily, 2.5 mg t, th on  05/19/2010 Previous Coagulation Comments:  . on  11/27/2008  NEW REGIMEN & LAB RESULTS Current INR: 1.8 Current Coumadin Dose(mg): 2.5 mg x 2 days Regimen: 5 mg daily, 2.5 mg t, th  (no change)  Provider: Para March Dose has been reviewed with patient or caretaker during this visit. Reviewed by: Celso Sickle  Anticoagulation Visit Questionnaire Coumadin dose missed/changed:  No Abnormal Bleeding Symptoms:  No  Any diet changes including alcohol intake, vegetables or greens since the last visit:  No Any illnesses or hospitalizations since the last visit:  No Any signs of clotting since the last visit (including chest discomfort, dizziness, shortness of breath, arm tingling, slurred speech, swelling or redness in leg):  No  MEDICATIONS LISINOPRIL 40 MG  TABS (LISINOPRIL) Take 1 tablet by mouth once a day FUROSEMIDE 80 MG TABS (FUROSEMIDE) Take 1 tablet by mouth once a day WARFARIN SODIUM 5 MG  TABS (WARFARIN SODIUM) Take 1 tablet by mouth once a day as directed. ALENDRONATE SODIUM 70 MG  TABS (ALENDRONATE SODIUM) Take 1 tablet  by mouth each week. LIPITOR 80 MG TABS (ATORVASTATIN CALCIUM) Take 1 tablet by mouth once a day METOPROLOL SUCCINATE 200 MG XR24H-TAB (METOPROLOL SUCCINATE) Take 1 tablet by mouth once a day VENTOLIN HFA 108 (90 BASE) MCG/ACT AERS (ALBUTEROL SULFATE) 1-2 puffs every 4 hours as needed for cough or wheeze ADVAIR DISKUS 250-50 MCG/DOSE AEPB (FLUTICASONE-SALMETEROL) 1 inh two times a day, rinse after use FLUOROMETHOLONE 0.1 % SUSP (FLUOROMETHOLONE) as needed TESSALON PERLES 100 MG  CAPS (BENZONATATE) 1 by mouth three times a day as needed cough ALLEGRA ALLERGY 60 MG TABS (FEXOFENADINE HCL) 1 tab by mouth two times a day as needed allergies HYDROCODONE-ACETAMINOPHEN 5-500 MG TABS (HYDROCODONE-ACETAMINOPHEN) 1/2-1 tab by mouth daily as  needed pain CLONIDINE HCL 0.2 MG TABS (CLONIDINE HCL) Take one tablet by mouth twice a day DIGOXIN 0.125 MG TABS (DIGOXIN) Take one tablet by mouth daily NEXIUM 40 MG CPDR (ESOMEPRAZOLE MAGNESIUM) one tablet daily FLONASE 50 MCG/ACT SUSP (FLUTICASONE PROPIONATE) 2 sprays per nostril per day. ZITHROMAX 250 MG TABS (AZITHROMYCIN) 2 by mouth today and then 1 by mouth once daily for 4 days.    Laboratory Results   Blood Tests   Date/Time Received: May 29, 2010 10:19 AM Date/Time Reported: May 29, 2010 10:19 AM  PT: 21.6 s   (Normal Range: 10.6-13.4)  INR: 1.8   (  Normal Range: 0.88-1.12   Therap INR: 2.0-3.5)

## 2010-06-06 ENCOUNTER — Encounter: Payer: Self-pay | Admitting: Family Medicine

## 2010-06-06 ENCOUNTER — Ambulatory Visit (INDEPENDENT_AMBULATORY_CARE_PROVIDER_SITE_OTHER): Payer: 59 | Admitting: Family Medicine

## 2010-06-06 ENCOUNTER — Ambulatory Visit: Payer: 59 | Admitting: Family Medicine

## 2010-06-06 DIAGNOSIS — J209 Acute bronchitis, unspecified: Secondary | ICD-10-CM | POA: Insufficient documentation

## 2010-06-10 ENCOUNTER — Ambulatory Visit: Payer: 59

## 2010-06-10 ENCOUNTER — Ambulatory Visit (INDEPENDENT_AMBULATORY_CARE_PROVIDER_SITE_OTHER): Payer: Medicare PPO

## 2010-06-10 ENCOUNTER — Encounter: Payer: Self-pay | Admitting: Family Medicine

## 2010-06-10 ENCOUNTER — Telehealth: Payer: Self-pay | Admitting: Family Medicine

## 2010-06-10 DIAGNOSIS — Z7901 Long term (current) use of anticoagulants: Secondary | ICD-10-CM

## 2010-06-10 DIAGNOSIS — I4891 Unspecified atrial fibrillation: Secondary | ICD-10-CM

## 2010-06-10 DIAGNOSIS — Z5181 Encounter for therapeutic drug level monitoring: Secondary | ICD-10-CM

## 2010-06-10 LAB — CONVERTED CEMR LAB: INR: 2.7

## 2010-06-12 NOTE — Progress Notes (Signed)
Summary: wants something for cough  Phone Note Call from Patient Call back at Home Phone [phone]   Caller: Patient Call For: Kerby Nora MD Summary of Call: Patient says that she is still coughing, it has made her feel sore, keeps her up at night. She has tried delsym otc as insructed, but it doesn't help at all. She is asking if she could have something called.  [address] Initial call taken by: [name],  June 04, 2010 4:50 PM  Follow-up for Phone Call        please call in tussionex susp, 2.5-43ml by mouth by mouth at bedtime with sedation caution.  4oz, no rf.  thanks.  Crawford Givens MD  June 04, 2010 5:15 PM.   Medication phoned to pharmacy. Patient Advised. [name] CMA (AAMA)  June 04, 2010 5:32 PM       New/Updated Medications: [name] ER 10-8 MG/5ML LQCR (HYDROCOD POLST-CHLORPHEN POLST) 2.5-14ml by mouth by mouth at bedtime with sedation caution. Prescriptions: TUSSIONEX PENNKINETIC ER 10-8 MG/5ML LQCR (HYDROCOD POLST-CHLORPHEN POLST) 2.5-49ml by mouth by mouth at bedtime with sedation caution.  #4 oz. x 0   Entered by:   [name] CMA (AAMA)   Authorized by:   Crawford Givens MD   Signed by:   [name] CMA (AAMA) on 06/04/2010   Method used:   Telephoned to ...       [facility]  [address] [phone]* (retail)       [address]       [place], [place]  [id number]       Ph: [phone] or [phone]       Fax: [phone]   RxID:   [id number]

## 2010-06-12 NOTE — Assessment & Plan Note (Signed)
Summary: COUGH IS NOT ANY BETTER   Vital Signs:  Patient profile:   77 year old female Height:      64 inches Weight:      223.75 pounds BMI:     38.55 O2 Sat:      97 % on Room air Temp:     98.1 degrees F oral Pulse rate:   86 / minute Pulse rhythm:   regular Resp:     18 per minute BP sitting:   120 / 82  (left arm) Cuff size:   large  Vitals Entered By: Benny Lennert CMA (AAMA) (June 06, 2010 2:21 PM)  O2 Flow:  Room air  History of Present Illness: Chief complaint cougn not any better  2 weeks of cough.  Seen 1/27 by Dr. Para March.. told to take mucinex and Advair two times a day, albuterol as needed. No improvement Called in Z-pack.  Returned 2/2.. no med changes.. felt still viral URI.  Still difficulty sleeping at night, productive cough, yellow color. Hoarse voice.  Poor appetite.   No fever.  NO SOB, no wheeze.  No ear pain, no face pain.  Using allegra and nasal steroid spray.  Allergies (verified): No Known Drug Allergies  Past History:  Past medical, surgical, family and social histories (including risk factors) reviewed, and no changes noted (except as noted below).  Past Medical History: Reviewed history from 10/02/2008 and no changes required. OVERWEIGHT/OBESITY (ICD-278.02) CARDIOMYOPATHY, SECONDARY (ICD-425.9) ATRIAL FLUTTER (ICD-427.32) ATRIAL FIBRILLATION (ICD-427.31) CONGESTIVE HEART FAILURE (ICD-428.0) PERIPHERAL EDEMA (ICD-782.3) HYPERLIPIDEMIA (ICD-272.4) HYPERTENSION (ICD-401.9) COUGH (ICD-786.2) ALLERGIC RHINITIS (ICD-477.9) URI (ICD-465.9) OTHER DYSPHAGIA (ICD-787.29) COLONIC POLYPS, HYPERPLASTIC (ICD-211.3) DIVERTICULOSIS, COLON (ICD-562.10) BENIGN NEOPLASM OTH&UNSPEC SITE DIGESTIVE SYSTEM (ICD-211.9) FATIGUE (ICD-780.79) STRICTURE, ESOPHAGEAL (ICD-530.3) PNEUMONIA, BACTERIAL NOS (ICD-482.9) SYMPTOM, COUGH (ICD-786.2) PREDIABETES (ICD-790.29) GERD (ICD-530.81) OSTEOARTHRITIS (ICD-715.90) KNEE PAIN, RIGHT, CHRONIC  (ICD-719.46) HERNIATED LUMBAR DISC (ICD-722.10) HIATAL HERNIA (ICD-553.3) ANTICOAGULATION THERAPY (ICD-V58.61)  Past Surgical History: Reviewed history from 07/11/2007 and no changes required. esophogeal stricture dilation 01/2007 Cholesystectomy 04 Ankle surgery 2001 Partial hysterectomy 1971  Family History: Reviewed history from 12/16/2006 and no changes required. father age 59s emphesema mother age 72 CAD,HTN daughter: brain cyst 2 sisters chol 2 brothers healthy 2 of 5 daughter with DM, HTN daughter with ovarian cancer 1 son with bone marrow cancer  Social History: Reviewed history from 05/23/2010 and no changes required. lost husband to cancer in 07/2006 5 children Occupation: Runner, broadcasting/film/video- retired Never Smoked Alcohol use-no Drug use-no Regular exercise-no diet:   Review of Systems General:  Complains of fatigue; denies fever. CV:  Denies chest pain or discomfort. Resp:  Complains of cough and shortness of breath; denies coughing up blood and wheezing. GI:  Complains of abdominal pain. GU:  Denies dysuria.  Physical Exam  General:  GEN: nad, alert and oriented HEENT: mucous membranes moist, tm wnl x2, sinuses not tender to palpation, OP wnl  NECK: supple w/o LA CV: IRR not tachy PULM: ctab, no inc wob EXT: no edema SKIN: no acute rash  Head:  no maxillary sinus ttp Ears:  External ear exam shows no significant lesions or deformities.  Otoscopic examination reveals clear canals, tympanic membranes are intact bilaterally without bulging, retraction, inflammation or discharge. Hearing is grossly normal bilaterally. Nose:  External nasal examination shows no deformity or inflammation. Nasal mucosa are pink and moist without lesions or exudates. Mouth:  Oral mucosa and oropharynx without lesions or exudates.  Teeth in good repair. Neck:  no carotid bruit or thyromegaly no cervical  or supraclavicular lymphadenopathy  Lungs:  Normal respiratory effort, chest expands  symmetrically. Lungs are clear to auscultation, no crackles or wheezes. Heart:  irr rate and regular rhythm. S1 and S2 normal without gallop, murmur, click, rub or other extra sounds.   Impression & Recommendations:  Problem # 1:  ACUTE BRONCHITIS (ICD-466.0) No current asthma exac. Given length of time..not improving.. will broaden antibitoics. Cough suppressant more affordable at night.     The following medications were removed from the medication list:    Zithromax 250 Mg Tabs (Azithromycin) .Marland Kitchen... 2 by mouth today and then 1 by mouth once daily for 4 days. Her updated medication list for this problem includes:    Ventolin Hfa 108 (90 Base) Mcg/act Aers (Albuterol sulfate) .Marland Kitchen... 1-2 puffs every 4 hours as needed for cough or wheeze    Advair Diskus 250-50 Mcg/dose Aepb (Fluticasone-salmeterol) .Marland Kitchen... 1 inh two times a day, rinse after use    Tessalon Perles 100 Mg Caps (Benzonatate) .Marland Kitchen... 1 by mouth three times a day as needed cough    Hydrocodone-homatropine 5-1.5 Mg/61ml Syrp (Hydrocodone-homatropine) .Marland Kitchen... 1 tsp by mouth at bedtime as needed cough    Doxycycline Hyclate 100 Mg Caps (Doxycycline hyclate) .Marland Kitchen... 1 tab by mouth two times a day x 10 days  Complete Medication List: 1)  Lisinopril 40 Mg Tabs (Lisinopril) .... Take 1 tablet by mouth once a day 2)  Furosemide 80 Mg Tabs (Furosemide) .... Take 1 tablet by mouth once a day 3)  Warfarin Sodium 5 Mg Tabs (Warfarin sodium) .... Take 1 tablet by mouth once a day as directed. 4)  Alendronate Sodium 70 Mg Tabs (Alendronate sodium) .... Take 1 tablet  by mouth each week. 5)  Lipitor 80 Mg Tabs (Atorvastatin calcium) .... Take 1 tablet by mouth once a day 6)  Metoprolol Succinate 200 Mg Xr24h-tab (Metoprolol succinate) .... Take 1 tablet by mouth once a day 7)  Ventolin Hfa 108 (90 Base) Mcg/act Aers (Albuterol sulfate) .Marland Kitchen.. 1-2 puffs every 4 hours as needed for cough or wheeze 8)  Advair Diskus 250-50 Mcg/dose Aepb  (Fluticasone-salmeterol) .Marland Kitchen.. 1 inh two times a day, rinse after use 9)  Fluorometholone 0.1 % Susp (Fluorometholone) .... As needed 10)  Tessalon Perles 100 Mg Caps (Benzonatate) .Marland Kitchen.. 1 by mouth three times a day as needed cough 11)  Allegra Allergy 60 Mg Tabs (Fexofenadine hcl) .Marland Kitchen.. 1 tab by mouth two times a day as needed allergies 12)  Hydrocodone-acetaminophen 5-500 Mg Tabs (Hydrocodone-acetaminophen) .... 1/2-1 tab by mouth daily as needed pain 13)  Clonidine Hcl 0.2 Mg Tabs (Clonidine hcl) .... Take one tablet by mouth twice a day 14)  Digoxin 0.125 Mg Tabs (Digoxin) .... Take one tablet by mouth daily 15)  Nexium 40 Mg Cpdr (Esomeprazole magnesium) .... One tablet daily 16)  Flonase 50 Mcg/act Susp (Fluticasone propionate) .... 2 sprays per nostril per day. 17)  Hydrocodone-homatropine 5-1.5 Mg/62ml Syrp (Hydrocodone-homatropine) .Marland Kitchen.. 1 tsp by mouth at bedtime as needed cough 18)  Doxycycline Hyclate 100 Mg Caps (Doxycycline hyclate) .Marland Kitchen.. 1 tab by mouth two times a day x 10 days  Patient Instructions: 1)  Return early next week for PT with [facility]. 2)  Start antibiotic.Marland Kitchen deoxycyline 100 mg twice. 3)  Cough syrup at bedtime. 4)  Can use benzonate during the day.  Prescriptions: DOXYCYCLINE HYCLATE 100 MG CAPS (DOXYCYCLINE HYCLATE) 1 tab by mouth two times a day x 10 days  #20 x 0   Entered and Authorized by:  Kerby Nora MD   Signed by:   Kerby Nora MD on 06/06/2010   Method used:   Print then Give to Patient   RxID:   [id number] HYDROCODONE-HOMATROPINE 5-1.5 MG/5ML SYRP (HYDROCODONE-HOMATROPINE) 1 tsp by mouth at bedtime as needed cough  #8 oz x 0   Entered and Authorized by:   Kerby Nora MD   Signed by:   Kerby Nora MD on 06/06/2010   Method used:   Print then Give to Patient   RxID:   [id number]    Orders Added: 1)  Est. Patient Level III [32440]    Current Allergies (reviewed today): No known allergies

## 2010-06-16 ENCOUNTER — Ambulatory Visit: Payer: Self-pay

## 2010-06-18 NOTE — Progress Notes (Signed)
Summary: ? Airborne  Phone Note Call from Patient   Call For: Kerby Nora MD Summary of Call: Patient wants to know if she could take sometoing like, Airborne. She wants something to boost immune system, sick often. Initial call taken by: Mills Koller,  June 10, 2010 11:40 AM  Follow-up for Phone Call        Best thing would be 500 mg vit C daily during cold season.  Follow-up by: Kerby Nora MD,  June 10, 2010 11:42 AM  Additional Follow-up for Phone Call Additional follow up Details #1::        Patient notified as instructed by telephone. Additional Follow-up by: Sydell Axon LPN,  June 10, 2010 11:48 AM

## 2010-07-08 ENCOUNTER — Ambulatory Visit (INDEPENDENT_AMBULATORY_CARE_PROVIDER_SITE_OTHER): Payer: Medicare PPO

## 2010-07-08 ENCOUNTER — Encounter: Payer: Self-pay | Admitting: Family Medicine

## 2010-07-08 DIAGNOSIS — I4891 Unspecified atrial fibrillation: Secondary | ICD-10-CM

## 2010-07-08 DIAGNOSIS — Z5181 Encounter for therapeutic drug level monitoring: Secondary | ICD-10-CM

## 2010-07-08 DIAGNOSIS — Z7901 Long term (current) use of anticoagulants: Secondary | ICD-10-CM

## 2010-07-08 LAB — CONVERTED CEMR LAB: Prothrombin Time: 45.2 s

## 2010-07-11 ENCOUNTER — Encounter: Payer: Self-pay | Admitting: Family Medicine

## 2010-07-15 NOTE — Medication Information (Signed)
Summary: protime/tw   PCP: Kerby Nora MD Indication 1: Atrial fibrillation PT 45.2 INR RANGE 2.0-3.5           Allergies: No Known Drug Allergies  Anticoagulation Management History:      Positive risk factors for bleeding include an age of 13 years or older.  The bleeding index is 'intermediate risk'.  Positive CHADS2 values include History of CHF, History of HTN, and Age > 75 years old.  Her last INR was 2.7 and today's INR is 3.8.  Prothrombin time is 45.2.    Anticoagulation Management Assessment/Plan:      The patient's current anticoagulation dose is Warfarin sodium 5 mg  tabs: Take 1 tablet by mouth once a day as directed..  The next INR is due 2 weeks.         ANTICOAGULATION RECORD PREVIOUS REGIMEN & LAB RESULTS Anticoagulation Diagnosis:  Atrial fibrillation on  08/13/2009 Previous INR Goal Range:  2.0-3.5 on  10/19/2007 Previous INR:  2.7 on  06/10/2010 Previous Coumadin Dose(mg):  2.5 mg x 2 days on  05/29/2010 Previous Regimen:  5 mg daily, 2.5 mg t, th on  05/19/2010 Previous Coagulation Comments:  Has been on antibiotics for congestion, Dr wanted INR checked today on  06/10/2010  NEW REGIMEN & LAB RESULTS Current INR: 3.8 Current Coumadin Dose(mg): 5 mg daily, 2.5 mg t, th Regimen: 5 mg daily, 2.5 mg t, th, sat  Provider: Clarabelle Oscarson Repeat testing in: 2 weeks Dose has been reviewed with patient or caretaker during this visit. Reviewed by: Celso Sickle  Anticoagulation Visit Questionnaire Coumadin dose missed/changed:  No Abnormal Bleeding Symptoms:  No  Any diet changes including alcohol intake, vegetables or greens since the last visit:  Yes      Diet Comments:not eating well, has been sick for a few weeks. Any illnesses or hospitalizations since the last visit:  No Any signs of clotting since the last visit (including chest discomfort, dizziness, shortness of breath, arm tingling, slurred speech, swelling or redness in leg):   No  MEDICATIONS LISINOPRIL 40 MG  TABS (LISINOPRIL) Take 1 tablet by mouth once a day FUROSEMIDE 80 MG TABS (FUROSEMIDE) Take 1 tablet by mouth once a day WARFARIN SODIUM 5 MG  TABS (WARFARIN SODIUM) Take 1 tablet by mouth once a day as directed. ALENDRONATE SODIUM 70 MG  TABS (ALENDRONATE SODIUM) Take 1 tablet  by mouth each week. LIPITOR 80 MG TABS (ATORVASTATIN CALCIUM) Take 1 tablet by mouth once a day METOPROLOL SUCCINATE 200 MG XR24H-TAB (METOPROLOL SUCCINATE) Take 1 tablet by mouth once a day VENTOLIN HFA 108 (90 BASE) MCG/ACT AERS (ALBUTEROL SULFATE) 1-2 puffs every 4 hours as needed for cough or wheeze ADVAIR DISKUS 250-50 MCG/DOSE AEPB (FLUTICASONE-SALMETEROL) 1 inh two times a day, rinse after use FLUOROMETHOLONE 0.1 % SUSP (FLUOROMETHOLONE) as needed TESSALON PERLES 100 MG  CAPS (BENZONATATE) 1 by mouth three times a day as needed cough ALLEGRA ALLERGY 60 MG TABS (FEXOFENADINE HCL) 1 tab by mouth two times a day as needed allergies HYDROCODONE-ACETAMINOPHEN 5-500 MG TABS (HYDROCODONE-ACETAMINOPHEN) 1/2-1 tab by mouth daily as needed pain CLONIDINE HCL 0.2 MG TABS (CLONIDINE HCL) Take one tablet by mouth twice a day DIGOXIN 0.125 MG TABS (DIGOXIN) Take one tablet by mouth daily NEXIUM 40 MG CPDR (ESOMEPRAZOLE MAGNESIUM) one tablet daily FLONASE 50 MCG/ACT SUSP (FLUTICASONE PROPIONATE) 2 sprays per nostril per day. HYDROCODONE-HOMATROPINE 5-1.5 MG/5ML SYRP (HYDROCODONE-HOMATROPINE) 1 tsp by mouth at bedtime as needed cough DOXYCYCLINE HYCLATE 100 MG CAPS (DOXYCYCLINE  HYCLATE) 1 tab by mouth two times a day x 10 days    Laboratory Results   Blood Tests   Date/Time Received: July 08, 2010 9:37 AM Date/Time Reported: July 08, 2010 9:37 AM  PT: 45.2 s   (Normal Range: 10.6-13.4)  INR: 3.8   (Normal Range: 0.88-1.12   Therap INR: 2.0-3.5)

## 2010-07-17 ENCOUNTER — Telehealth: Payer: Self-pay | Admitting: *Deleted

## 2010-07-17 NOTE — Telephone Encounter (Signed)
Patient says that she is having a lot of pain in her back. She is asking if she could get something for the pain called in to [address].

## 2010-07-17 NOTE — Telephone Encounter (Signed)
Okay to refill vicodin 5/500 mg .. Should be on med list from previous. Pt is abstracted.  1 tab po daily prn pain  # 30, 0 RF.

## 2010-07-18 ENCOUNTER — Other Ambulatory Visit: Payer: Self-pay | Admitting: *Deleted

## 2010-07-18 MED ORDER — HYDROCODONE-ACETAMINOPHEN 5-500 MG PO TABS: 1.0000 | ORAL_TABLET | Freq: Four times a day (QID) | ORAL | Status: DC | PRN

## 2010-07-22 ENCOUNTER — Ambulatory Visit (INDEPENDENT_AMBULATORY_CARE_PROVIDER_SITE_OTHER): Payer: Medicare PPO | Admitting: Family Medicine

## 2010-07-22 DIAGNOSIS — Z7901 Long term (current) use of anticoagulants: Secondary | ICD-10-CM

## 2010-07-22 DIAGNOSIS — Z5181 Encounter for therapeutic drug level monitoring: Secondary | ICD-10-CM

## 2010-07-22 DIAGNOSIS — I4891 Unspecified atrial fibrillation: Secondary | ICD-10-CM

## 2010-07-22 LAB — POCT INR: INR: 2.6

## 2010-07-22 NOTE — Patient Instructions (Signed)
No changes

## 2010-07-22 NOTE — Progress Notes (Signed)
Reviewed and agree with plan.

## 2010-07-23 ENCOUNTER — Emergency Department: Payer: Self-pay | Admitting: Emergency Medicine

## 2010-07-23 ENCOUNTER — Telehealth: Payer: Self-pay | Admitting: Radiology

## 2010-07-23 DIAGNOSIS — Z7901 Long term (current) use of anticoagulants: Secondary | ICD-10-CM

## 2010-07-23 DIAGNOSIS — I4891 Unspecified atrial fibrillation: Secondary | ICD-10-CM

## 2010-07-23 DIAGNOSIS — Z5181 Encounter for therapeutic drug level monitoring: Secondary | ICD-10-CM

## 2010-07-23 NOTE — Telephone Encounter (Signed)
Order for INR

## 2010-07-25 ENCOUNTER — Telehealth: Payer: Self-pay | Admitting: *Deleted

## 2010-07-25 NOTE — Telephone Encounter (Signed)
Reviewed note

## 2010-07-25 NOTE — Telephone Encounter (Signed)
Patient wanted to let you know that she took her daughter to have her colonoscopy done and while she was there she feel going to up the steps. She says that she went to the [facility] ED and they did blood work, EKG, and xray. Her knee is very swollen and bruised, but there wasn't any breaks. Her wrist is fractured. She just wanted you to be aware.

## 2010-07-27 ENCOUNTER — Observation Stay (HOSPITAL_COMMUNITY)
Admission: EM | Admit: 2010-07-27 | Discharge: 2010-07-28 | Disposition: A | Discharge status: Home / Self Care | Payer: Medicare PPO | Attending: Emergency Medicine | Admitting: Emergency Medicine

## 2010-07-27 ENCOUNTER — Emergency Department (HOSPITAL_COMMUNITY): Payer: Medicare PPO

## 2010-07-27 DIAGNOSIS — IMO0002 Reserved for concepts with insufficient information to code with codable children: Secondary | ICD-10-CM | POA: Insufficient documentation

## 2010-07-27 DIAGNOSIS — Z9181 History of falling: Secondary | ICD-10-CM | POA: Insufficient documentation

## 2010-07-27 DIAGNOSIS — X58XXXA Exposure to other specified factors, initial encounter: Secondary | ICD-10-CM | POA: Insufficient documentation

## 2010-07-27 DIAGNOSIS — E119 Type 2 diabetes mellitus without complications: Secondary | ICD-10-CM | POA: Insufficient documentation

## 2010-07-27 DIAGNOSIS — L02419 Cutaneous abscess of limb, unspecified: Principal | ICD-10-CM | POA: Insufficient documentation

## 2010-07-27 DIAGNOSIS — Y929 Unspecified place or not applicable: Secondary | ICD-10-CM | POA: Insufficient documentation

## 2010-07-27 DIAGNOSIS — R51 Headache: Secondary | ICD-10-CM | POA: Insufficient documentation

## 2010-07-27 DIAGNOSIS — I4891 Unspecified atrial fibrillation: Secondary | ICD-10-CM | POA: Insufficient documentation

## 2010-07-27 DIAGNOSIS — S5290XA Unspecified fracture of unspecified forearm, initial encounter for closed fracture: Secondary | ICD-10-CM | POA: Insufficient documentation

## 2010-07-27 DIAGNOSIS — L03119 Cellulitis of unspecified part of limb: Secondary | ICD-10-CM | POA: Insufficient documentation

## 2010-07-27 DIAGNOSIS — Z7901 Long term (current) use of anticoagulants: Secondary | ICD-10-CM | POA: Insufficient documentation

## 2010-07-27 LAB — DIFFERENTIAL
Basophils Absolute: 0 10*3/uL (ref 0.0–0.1)
Basophils Relative: 0 % (ref 0–1)
Eosinophils Absolute: 0.1 10*3/uL (ref 0.0–0.7)
Neutrophils Relative %: 71 % (ref 43–77)

## 2010-07-27 LAB — BASIC METABOLIC PANEL
BUN: 4 mg/dL — ABNORMAL LOW (ref 6–23)
Chloride: 96 mEq/L (ref 96–112)
Glucose, Bld: 116 mg/dL — ABNORMAL HIGH (ref 70–99)
Potassium: 2.9 mEq/L — ABNORMAL LOW (ref 3.5–5.1)

## 2010-07-27 LAB — CBC
Platelets: 227 10*3/uL (ref 150–400)
RBC: 3.76 MIL/uL — ABNORMAL LOW (ref 3.87–5.11)
WBC: 11.5 10*3/uL — ABNORMAL HIGH (ref 4.0–10.5)

## 2010-07-27 LAB — PROTIME-INR: Prothrombin Time: 31.9 seconds — ABNORMAL HIGH (ref 11.6–15.2)

## 2010-07-28 ENCOUNTER — Telehealth: Payer: Self-pay | Admitting: *Deleted

## 2010-07-28 NOTE — Telephone Encounter (Signed)
[facility] Triage Call Report Triage Record Num: [id number] Operator: Merlinda Frederick Patient Name: Angela Ali Call Date & Time: 07/27/2010 3:10:20PM Patient Phone: [phone] PCP: Kerby Nora Patient Gender: Female PCP Fax : [phone] Patient DOB: Sep 30, 1928 Practice Name: [facility] Reason for Call: Pt calling 07/27/10 about swelling and pain to R knee. Pt fell on 07/22/10 hitting her knee on concrete. Pt states that has pain/swelling has increased from 07/24/10. Also has increasing redness and blistering to the knee. Pt taking 1 Percocet po q 6 hrs with no relief. Per Knee Injury Guideline, ED disposition for unbearable pain. Pt will go to the ED at [facility] for further evaluation. Protocol(s) Used: Knee Injury Recommended Outcome per Protocol: See ED Immediately Reason for Outcome: Unbearable pain Care Advice: ~ Another adult should drive. 07/27/2010 3:26:14PM Page 1 of 1 [id number]

## 2010-08-01 ENCOUNTER — Emergency Department (HOSPITAL_COMMUNITY): Payer: Medicare PPO

## 2010-08-01 ENCOUNTER — Inpatient Hospital Stay (HOSPITAL_COMMUNITY)
Admission: EM | Admit: 2010-08-01 | Discharge: 2010-08-07 | DRG: 315 | Disposition: A | Discharge status: Home Health | Payer: Medicare PPO | Attending: Internal Medicine | Admitting: Internal Medicine

## 2010-08-01 DIAGNOSIS — Z79899 Other long term (current) drug therapy: Secondary | ICD-10-CM

## 2010-08-01 DIAGNOSIS — E876 Hypokalemia: Secondary | ICD-10-CM | POA: Diagnosis present

## 2010-08-01 DIAGNOSIS — K5900 Constipation, unspecified: Secondary | ICD-10-CM | POA: Diagnosis not present

## 2010-08-01 DIAGNOSIS — R269 Unspecified abnormalities of gait and mobility: Secondary | ICD-10-CM | POA: Diagnosis present

## 2010-08-01 DIAGNOSIS — E119 Type 2 diabetes mellitus without complications: Secondary | ICD-10-CM | POA: Diagnosis present

## 2010-08-01 DIAGNOSIS — Z7901 Long term (current) use of anticoagulants: Secondary | ICD-10-CM

## 2010-08-01 DIAGNOSIS — I10 Essential (primary) hypertension: Secondary | ICD-10-CM | POA: Diagnosis present

## 2010-08-01 DIAGNOSIS — R791 Abnormal coagulation profile: Secondary | ICD-10-CM | POA: Diagnosis present

## 2010-08-01 DIAGNOSIS — R609 Edema, unspecified: Secondary | ICD-10-CM | POA: Diagnosis present

## 2010-08-01 DIAGNOSIS — K219 Gastro-esophageal reflux disease without esophagitis: Secondary | ICD-10-CM | POA: Diagnosis present

## 2010-08-01 DIAGNOSIS — E785 Hyperlipidemia, unspecified: Secondary | ICD-10-CM | POA: Diagnosis present

## 2010-08-01 DIAGNOSIS — IMO0001 Reserved for inherently not codable concepts without codable children: Secondary | ICD-10-CM

## 2010-08-01 DIAGNOSIS — I4891 Unspecified atrial fibrillation: Secondary | ICD-10-CM | POA: Diagnosis present

## 2010-08-01 DIAGNOSIS — T45515A Adverse effect of anticoagulants, initial encounter: Secondary | ICD-10-CM | POA: Diagnosis present

## 2010-08-01 DIAGNOSIS — D62 Acute posthemorrhagic anemia: Secondary | ICD-10-CM | POA: Diagnosis present

## 2010-08-01 DIAGNOSIS — R58 Hemorrhage, not elsewhere classified: Principal | ICD-10-CM | POA: Diagnosis present

## 2010-08-01 LAB — BASIC METABOLIC PANEL
CO2: 31 mEq/L (ref 19–32)
Calcium: 9.4 mg/dL (ref 8.4–10.5)
Creatinine, Ser: 0.6 mg/dL (ref 0.4–1.2)
Glucose, Bld: 130 mg/dL — ABNORMAL HIGH (ref 70–99)

## 2010-08-01 LAB — CBC
HCT: 26.6 % — ABNORMAL LOW (ref 36.0–46.0)
Hemoglobin: 8.8 g/dL — ABNORMAL LOW (ref 12.0–15.0)
MCH: 28.7 pg (ref 26.0–34.0)
MCHC: 33.1 g/dL (ref 30.0–36.0)

## 2010-08-01 LAB — ABO/RH: ABO/RH(D): A POS

## 2010-08-02 LAB — CBC
HCT: 25.2 % — ABNORMAL LOW (ref 36.0–46.0)
HCT: 24.2 % — ABNORMAL LOW (ref 36.0–46.0)
Hemoglobin: 8 g/dL — ABNORMAL LOW (ref 12.0–15.0)
MCHC: 32.6 g/dL (ref 30.0–36.0)
MCV: 87.4 fL (ref 78.0–100.0)
MCV: 88.2 fL (ref 78.0–100.0)
Platelets: 289 10*3/uL (ref 150–400)
RBC: 2.77 MIL/uL — ABNORMAL LOW (ref 3.87–5.11)
RDW: 16.3 % — ABNORMAL HIGH (ref 11.5–15.5)
RDW: 16.3 % — ABNORMAL HIGH (ref 11.5–15.5)
WBC: 8.9 10*3/uL (ref 4.0–10.5)
WBC: 8.8 10*3/uL (ref 4.0–10.5)
WBC: 8.4 10*3/uL (ref 4.0–10.5)

## 2010-08-02 LAB — GLUCOSE, CAPILLARY
Glucose-Capillary: 127 mg/dL — ABNORMAL HIGH (ref 70–99)
Glucose-Capillary: 155 mg/dL — ABNORMAL HIGH (ref 70–99)
Glucose-Capillary: 144 mg/dL — ABNORMAL HIGH (ref 70–99)

## 2010-08-02 LAB — COMPREHENSIVE METABOLIC PANEL
BUN: 4 mg/dL — ABNORMAL LOW (ref 6–23)
Calcium: 8.6 mg/dL (ref 8.4–10.5)
Creatinine, Ser: 0.55 mg/dL (ref 0.4–1.2)
GFR calc non Af Amer: >60 mL/min (ref >60)
Glucose, Bld: 117 mg/dL — ABNORMAL HIGH (ref 70–99)
Sodium: 133 mEq/L — ABNORMAL LOW (ref 135–145)
Total Protein: 5.7 g/dL — ABNORMAL LOW (ref 6.0–8.3)

## 2010-08-02 LAB — PROTIME-INR: INR: 2.08 — ABNORMAL HIGH (ref 0.00–1.49)

## 2010-08-03 LAB — COMPREHENSIVE METABOLIC PANEL
BUN: 3 mg/dL — ABNORMAL LOW (ref 6–23)
CO2: 30 mEq/L (ref 19–32)
Calcium: 9.1 mg/dL (ref 8.4–10.5)
Creatinine, Ser: 0.54 mg/dL (ref 0.4–1.2)
GFR calc non Af Amer: >60 mL/min (ref >60)
Glucose, Bld: 126 mg/dL — ABNORMAL HIGH (ref 70–99)
Sodium: 139 mEq/L (ref 135–145)
Total Protein: 6.6 g/dL (ref 6.0–8.3)

## 2010-08-03 LAB — GLUCOSE, CAPILLARY: Glucose-Capillary: 134 mg/dL — ABNORMAL HIGH (ref 70–99)

## 2010-08-03 LAB — PREPARE FRESH FROZEN PLASMA: Unit division: 0

## 2010-08-03 LAB — CBC
HCT: 25 % — ABNORMAL LOW (ref 36.0–46.0)
MCH: 28.2 pg (ref 26.0–34.0)
MCHC: 32.4 g/dL (ref 30.0–36.0)
MCV: 88 fL (ref 78.0–100.0)
MCV: 88.2 fL (ref 78.0–100.0)
Platelets: 320 10*3/uL (ref 150–400)
Platelets: 328 10*3/uL (ref 150–400)
RBC: 2.87 MIL/uL — ABNORMAL LOW (ref 3.87–5.11)
RDW: 16.5 % — ABNORMAL HIGH (ref 11.5–15.5)
RDW: 16.5 % — ABNORMAL HIGH (ref 11.5–15.5)
WBC: 9.1 10*3/uL (ref 4.0–10.5)

## 2010-08-03 LAB — PROTIME-INR: Prothrombin Time: 18.6 seconds — ABNORMAL HIGH (ref 11.6–15.2)

## 2010-08-04 ENCOUNTER — Inpatient Hospital Stay (HOSPITAL_COMMUNITY): Payer: Medicare PPO

## 2010-08-04 ENCOUNTER — Ambulatory Visit: Payer: Medicare PPO | Admitting: Family Medicine

## 2010-08-04 LAB — BASIC METABOLIC PANEL
Calcium: 8.7 mg/dL (ref 8.4–10.5)
GFR calc Af Amer: >60 mL/min (ref >60)
GFR calc non Af Amer: >60 mL/min (ref >60)
Potassium: 4.6 mEq/L (ref 3.5–5.1)
Sodium: 137 mEq/L (ref 135–145)

## 2010-08-04 LAB — CBC
HCT: 23.4 % — ABNORMAL LOW (ref 36.0–46.0)
Platelets: 296 10*3/uL (ref 150–400)
RBC: 2.64 MIL/uL — ABNORMAL LOW (ref 3.87–5.11)
RDW: 16.4 % — ABNORMAL HIGH (ref 11.5–15.5)
WBC: 7.9 10*3/uL (ref 4.0–10.5)

## 2010-08-04 LAB — PROTIME-INR: Prothrombin Time: 18.4 seconds — ABNORMAL HIGH (ref 11.6–15.2)

## 2010-08-04 LAB — GLUCOSE, CAPILLARY: Glucose-Capillary: 115 mg/dL — ABNORMAL HIGH (ref 70–99)

## 2010-08-05 ENCOUNTER — Telehealth: Payer: Self-pay | Admitting: *Deleted

## 2010-08-05 DIAGNOSIS — S88119A Complete traumatic amputation at level between knee and ankle, unspecified lower leg, initial encounter: Secondary | ICD-10-CM

## 2010-08-05 DIAGNOSIS — I739 Peripheral vascular disease, unspecified: Secondary | ICD-10-CM

## 2010-08-05 DIAGNOSIS — L98499 Non-pressure chronic ulcer of skin of other sites with unspecified severity: Secondary | ICD-10-CM

## 2010-08-05 DIAGNOSIS — E1149 Type 2 diabetes mellitus with other diabetic neurological complication: Secondary | ICD-10-CM

## 2010-08-05 DIAGNOSIS — M069 Rheumatoid arthritis, unspecified: Secondary | ICD-10-CM

## 2010-08-05 LAB — BASIC METABOLIC PANEL
Chloride: 105 mEq/L (ref 96–112)
Creatinine, Ser: 0.58 mg/dL (ref 0.4–1.2)
GFR calc Af Amer: >60 mL/min (ref >60)
Potassium: 3.6 mEq/L (ref 3.5–5.1)
Sodium: 139 mEq/L (ref 135–145)

## 2010-08-05 LAB — CBC
HCT: 26.9 % — ABNORMAL LOW (ref 36.0–46.0)
Hemoglobin: 8.8 g/dL — ABNORMAL LOW (ref 12.0–15.0)
RBC: 3.06 MIL/uL — ABNORMAL LOW (ref 3.87–5.11)
WBC: 7.5 10*3/uL (ref 4.0–10.5)

## 2010-08-05 LAB — GLUCOSE, CAPILLARY
Glucose-Capillary: 109 mg/dL — ABNORMAL HIGH (ref 70–99)
Glucose-Capillary: 116 mg/dL — ABNORMAL HIGH (ref 70–99)
Glucose-Capillary: 111 mg/dL — ABNORMAL HIGH (ref 70–99)

## 2010-08-05 LAB — PROTIME-INR: INR: 1.73 — ABNORMAL HIGH (ref 0.00–1.49)

## 2010-08-05 NOTE — Telephone Encounter (Signed)
Pt is in [place], since 4/6.  She just wanted you to know.  She is asking that you review her notes from there.  I advised her to rest, take it easy and let them take care of her.

## 2010-08-06 DIAGNOSIS — S52539A Colles' fracture of unspecified radius, initial encounter for closed fracture: Secondary | ICD-10-CM

## 2010-08-06 LAB — CROSSMATCH
ABO/RH(D): A POS
Antibody Screen: NEGATIVE
Unit division: 0
Unit division: 0

## 2010-08-06 LAB — GLUCOSE, CAPILLARY
Glucose-Capillary: 110 mg/dL — ABNORMAL HIGH (ref 70–99)
Glucose-Capillary: 125 mg/dL — ABNORMAL HIGH (ref 70–99)

## 2010-08-06 LAB — CBC
MCH: 29.2 pg (ref 26.0–34.0)
MCV: 88.5 fL (ref 78.0–100.0)
Platelets: 331 10*3/uL (ref 150–400)
RBC: 3.22 MIL/uL — ABNORMAL LOW (ref 3.87–5.11)
RDW: 16.3 % — ABNORMAL HIGH (ref 11.5–15.5)
WBC: 8.2 10*3/uL (ref 4.0–10.5)

## 2010-08-06 LAB — PROTIME-INR: Prothrombin Time: 24.1 seconds — ABNORMAL HIGH (ref 11.6–15.2)

## 2010-08-06 NOTE — Telephone Encounter (Signed)
i am agree with Ms. Lindwood Qua

## 2010-08-07 LAB — GLUCOSE, CAPILLARY: Glucose-Capillary: 138 mg/dL — ABNORMAL HIGH (ref 70–99)

## 2010-08-07 LAB — PROTIME-INR: Prothrombin Time: 30.2 seconds — ABNORMAL HIGH (ref 11.6–15.2)

## 2010-08-07 LAB — BASIC METABOLIC PANEL
BUN: 3 mg/dL — ABNORMAL LOW (ref 6–23)
CO2: 30 mEq/L (ref 19–32)
Chloride: 102 mEq/L (ref 96–112)
Creatinine, Ser: 0.64 mg/dL (ref 0.4–1.2)
GFR calc Af Amer: >60 mL/min (ref >60)

## 2010-08-08 NOTE — Discharge Summary (Signed)
NAME:  Angela Ali, Angela Ali                 ACCOUNT NO.:  [id number]  MEDICAL RECORD NO.:  [id number]           PATIENT TYPE:  I  LOCATION:  4706                         FACILITY:  [facility]  PHYSICIAN:  Isidor Holts, M.D.  DATE OF BIRTH:  Mar 06, 1929  DATE OF ADMISSION:  08/01/2010 DATE OF DISCHARGE:  08/07/2010                              DISCHARGE SUMMARY   PRIMARY MD:  Dr. Pattricia Boss  DISCHARGE DIAGNOSES: 1. Status post mechanical fall, July 23, 2010. 2. Comminuted right wrist fracture, complicating #1 above.    Surgical intervention, not indicated. 3. Coumadin-induced coagulopathy. 4. Right lower extremity hematoma, complicating numbers 1, 2, 3 above. 5. Acute Ali loss anemia secondary to above. Required transfusion    of PRBCs during this hospitalization. 6. Chronic atrial fibrillation. 7. Chronic anticoagulation. 8. Hypertension. 9. Diet-controlled type 2 diabetes mellitus. 10.Dyslipidemia. 11.Morbid obesity.  DISCHARGE MEDICATIONS: 1. Tylenol 650 mg p.o. p.r.n. q.4 hourly for pain. 2. Ascorbic acid 500 mg p.o. daily. 3. Cholecalciferol (vitamin D) 400 units p.o. daily. 4. Lisinopril 30 mg p.o. b.i.d. (was on 40 mg p.o. daily). 5. Ultram 25 mg p.o. p.r.n. q.6 hourly for pain, a 15-day supply has     been dispensed. 6. Coumadin per INR, currently on 2 mg p.o. at 6:00 p.m. daily (was on     2.5 mg p.o. on Tuesdays and Saturdays and 5 mg p.o. on all other     days.) 7. Calcium carbonate OTC one p.o. daily. 8. Clonidine 0.4 mg p.o. b.i.d. 9. Cod liver old OTC 1 tablet p.o. daily. 10.Digoxin 125 mcg p.o. daily. 11.Docusate sodium 100 mg p.o. daily. 12.Fish oil OTC 1 capsule p.o. daily. 13.Lipitor 80 mg p.o. daily. 14.Metoprolol succinate 200 mg p.o. daily. 15.Nexium 40 mg p.o. daily.  Note:  Vitamin D OTC, vitamin C OTC, hydrocodone/APAP (5/325), clindamycin and lisinopril 40 mg, have all been discontinued.  PROCEDURES: 1. X-ray right tibia/fibula on August 01, 2010, this  showed persistent     soft tissue mass in soft tissues of the right lower leg unchanged,     this most likely represents a hematoma given the patient's history. 2. X-ray right wrist on August 04, 2010.  This showed comminuted     fracture of the distal radius extending into the radiocarpal joint.     No prior studies available to compare alignment, they still see     fracture of the ulnar styloid.  CONSULTATIONS:  Toni Arthurs, MD, orthopedic surgeon.  ADMISSION HISTORY:  As in H and P notes of August 01, 2010 dictated by Dr. Lonia Ali.  However, in brief, this is a 77 year old female, with known history of atrial fibrillation on chronic anticoagulation, gait instability, hypertension, diet-controlled, type 2 diabetes mellitus, history of chronic lower extremity edema, GERD, dyslipidemia, presenting following a mechanical fall on July 15, 2010.  Apparently, she was escorted by her daughter to her doctor's office at [place], where she tripped and fell, sustaining a right wrist fracture and a wound in her lower extremity.  She developed progressive right lower extremity swelling.  Initially, she was felt to have a cellulitis and as a  matter of fact was placed on Cleocin treatment.  However, this turned out subsequently to be a large hematoma.  On initial evaluation, the patient's INR was 5.9.  She was also found to be anemic.  The patient was subsequently admitted for further evaluation, investigation, and management.  CLINICAL COURSE: 1. Right wrist fracture.  The patient's x-rays demonstrated a     comminuted right wrist fracture.  Orthopedic consultation was     kindly provided by Dr. Toni Arthurs.  The patient was managed with     splinting.  He also was of opinion that there was no surgical     indication and has undertaken to follow the patient in the office,     with repeat films of this injury.  2. Right lower extremity hematoma.  This was secondary to mechanical     fall,  against a background of Coumadin-induced coagulopathy with an     INR of 5.9.  Anticoagulation was reversed and per Orthopedic     recommendation, compressive wrap was applied around the right lower     extremity.  The patient was managed on analgesics.  Anticoagulation     was held, with improvement in hematoma, which is anticipated     continued to resolve.  3. Atrial fibrillation.  The patient remained rate controlled during     the course of her hospitalization.  4. Chronic anticoagulation.  As described above, anticoagulation was     initially reversed and then held.  Subsequently per     recommendation of the orthopedic surgeons, we were able to     reinstate the patient's anticoagulation, and as of August 07, 2010, INR     was therapeutic at 2.88.  5. Hypertension.  This was managed with ACE inhibitor and beta-blocker     therapy, as well as clonidine.  The patient's lisinopril dosage has     been increased to 30 mg p.o. b.i.d.  6. Diet-controlled type 2 diabetes mellitus.  The patient remained     euglycemic during the course of this hospitalization.  DISPOSITION:  The patient was on August 07, 2010, considered clinically stable for discharge.  She has therefore been discharged accordingly. She will go to short-term skilled nursing facility for rehab.  The patient was discharged on August 07, 2010.  ACTIVITY:  As tolerated.  Recommended to increase activity slowly, otherwise per PT/OT/rehab.  FOLLOWUP INSTRUCTIONS:  The patient will follow up with SNF MD. However, she will continue to follow up with her primary MD, Dr. Pattricia Boss following discharge.  SPECIAL INSTRUCTIONS:  SNF MD will continue to monitor the patient's PT/INR and recommend modifications of Coumadin dosage as indicated. Followup with Dr. Toni Arthurs, orthopedic surgeon, will be in 1 month at his office.  Skilled Nursing Facility is expected to arrange transport, should the patient still be at the skilled  nursing facility at this time.  All this has been communicated to the patient and her family. They verbalized understanding.     Isidor Holts, M.D.     CO/MEDQ  D:  08/07/2010  T:  08/07/2010  Job:  [id number]  cc:   Dr. Baldwin Jamaica, MD  Electronically Signed by Isidor Holts M.D. on 08/08/2010 04:33:40 PM

## 2010-08-15 NOTE — Consult Note (Signed)
NAME:  Angela Ali, Angela Ali                 ACCOUNT NO.:  [id number]  MEDICAL RECORD NO.:  [id number]           PATIENT TYPE:  I  LOCATION:  4705                         FACILITY:  [facility]  PHYSICIAN:  Toni Arthurs, MD        DATE OF BIRTH:  11-24-1928  DATE OF CONSULTATION:  08/01/2010 DATE OF DISCHARGE:                                CONSULTATION   REASON FOR CONSULTATION:  Right leg hematoma, right wrist fracture.  HISTORY OF PRESENT ILLNESS:  The patient is a 77 year old female who is on chronic Coumadin therapy for atrial fibrillation.  She fell several days ago onto her outstretched right hand and right leg.  She has noticed progressive swelling of the right leg and increasing pain over the past few days.  She presented to the emergency department at [facility] where her INR was noted to be 5.29.  She is being admitted by the [facility].  I am asked to consult for evaluation and treatment of these injuries.  The patient complains of dull aching pain in the calf and anterior leg that is worse with standing and walking. It feels better when she rests and keeps it elevated.  The right wrist aches in a dull minor way.  It is feeling better with the splint on.  It is worse when she tries to move her wrist.  She has initially seen Dr. Hyacinth Meeker at the [facility] in [place] but she is in [place] and would like to be seen here for this injury.  She denies numbness, tingling, or weakness to her leg or arm.  She is not a smoker.  She is diabetic.  PAST MEDICAL HISTORY: 1. Type 2 diabetes diet controlled. 2. Chronic atrial fibrillation. 3. Hypertension. 4. Low back pain and lumbar radiculopathy. 5. Right distal radius fracture.  PAST SURGICAL HISTORY: 1. Hysterectomy. 2. Cholecystectomy.  SOCIAL HISTORY:  The patient resides here in [place].  She does not use tobacco products.  She is retired.  FAMILY HISTORY:  Positive for diabetes, hypertension, emphysema,  and heart problems.  REVIEW OF SYSTEMS:  As above and otherwise negative.  PHYSICAL EXAMINATION:  The patient's temperature is 99.3.  The patient is a well-nourished, well-developed woman in no apparent distress, alert and oriented x4.  Mood and affect normal.  Extraocular motions are intact.  Respirations are unlabored.  Her right upper extremity is immobilized in a splint.  The fingers are swollen.  Skin is intact. There is capillary refill of the fingers.  She has intact sensibility to light touch in the radial, ulnar, and median nerve distribution.  Motor function is intact and strength is 5/5 in the radial, ulnar, and median nerve distributions as well.  The right leg has intact skin.  There is a large superficial blister that is filled with dark blood.  This measures approximately 8 cm across and is up 3 cm high.  Surrounding this area is generalized swelling of the anterior compartment of the leg.  There is no significant warmth.  There is some mild erythema around this area. There is otherwise intact healthy skin.  Pulses are palpable distally. Sensibility to light touch is intact distally.  No lymphadenopathy is noted.  X-RAYS:  AP and lateral right tibia and fibula show no evidence of fracture dislocation or other acute injury.  Films of the knee show no fracture dislocation.  There is significant osteoarthritis in the right knee.  LABORATORY VALUES:  White blood cell count 10.3, INR 5.29.  ASSESSMENT: 1. Right distal radius fracture. 2. Right leg hematoma. 3. Elevated INR due to chronic Coumadin therapy.  PLAN:  I explained to the patient and her family in detail the nature of this condition.  This hematoma is the result of an elevated INR.  At this point, her skin is intact.  There is no surgical indication.  The patient does not have any significant evidence currently of cellulitis. Since she has no evidence of cellulitis, I think a compressive wrap around the knee  will help stabilize this area and make her more comfortable.  With correction of her INR, we can expect the hematoma to quit expanding and to again consolidation and resolving.  With respect to her right distal radius fracture, she will continue to be splinted.  I will follow up in the office with repeat films of this injury.  She will keep the hand elevated to try to minimize her swelling.  She and her family understand this plan and agree.     Toni Arthurs, MD     JH/MEDQ  D:  08/01/2010  T:  08/02/2010  Job:  [id number]  Electronically Signed by Toni Arthurs  on 08/15/2010 10:31:31 PM

## 2010-08-17 NOTE — Group Therapy Note (Signed)
NAME:  Angela Ali, Angela Ali                 ACCOUNT NO.:  [id number]  MEDICAL RECORD NO.:  [id number]           PATIENT TYPE:  I  LOCATION:  4706                         FACILITY:  [facility]  PHYSICIAN:  Kela Millin, M.D.DATE OF BIRTH:  [birth year]                                PROGRESS NOTE   CURRENT DIAGNOSES: 1. Large right lower extremity hematoma - in the setting of     supratherapeutic INR. 2. Supratherapeutic INR - resolved. 3. Anemia, acute blood loss - secondary to above. 4. Right wrist fracture, comminuted - per Orthopedics, the patient     declined surgery.  Outpatient followup with Dr. Victorino Dike in clinic in     about a month. 5. Atrial fibrillation - rate controlled, Coumadin resumed as per     Orthopedic recommendation. 6. Hypertension. 7. Diabetes mellitus, diet controlled. 8. History of chronic peripheral edema. 9. Gastroesophageal reflux disease. 10.History of hyperlipidemia. 11.History of hypertension.  PROCEDURES AND STUDIES: 1. X-ray of tibia-fibula on April 6 - persistent soft tissue mass in     the soft tissues of the right lower leg, unchanged.  This likely     represents a hematoma given the patient's history. 2. Right wrist x-ray on April 9 - comminuted fracture of the distal     radius extending into the radiocarpal joint.  No other studies are     available to compare alignment.  There was also a fracture of the     ulnar styloid.  CONSULTATIONS: Orthopedics - Dr. Victorino Dike.  BRIEF HISTORY: The patient is a pleasant 77 year old black female with the above-listed medical problems who sustained a mechanical fall on July 23, 2010.  She reported that she had taken her daughter for a doctor's appointment and then suddenly tripped and fell.  It was noted that she sustained a right wrist fracture and had some bruising on the right lower extremity initially.  She reported that she was seen in the ER on April 1 and discharged home but following that the right lower  extremity area became very swollen and was tender and very dark.  She also reported that she had a blister filled with blood there.  Subsequently, she came back to the ED and was initially thought to have cellulitis but on further evaluation, he was found to have a large hematoma and her INR had increased to 5.9 and she had dropped her hemoglobin from when she was in the ED on April 1, at which time, an x-ray had shown tricompartment osteoarthritis and no other significant findings and she had also had a CT scan of her head, which was within normal limits.  She was admitted up on return for further evaluation and management.  HOSPITAL COURSE: 1. Large lower extremity hematoma - as discussed above, upon return to     the ED, the patient had lab work done which revealed an INR of     5.29.  An x-ray of the tibia-fibula which showed a mass consistent     with a hematoma from her prior fall.  She was given fresh frozen     plasma in  the ED and also vitamin K and upon followup, her INR had     decreased to 2.08.  Orthopedics was consulted per the ED and Dr.     Victorino Dike saw the patient and recommended a compressive dressing to     the right lower extremity.  He stated that there was no indication     for surgical treatment.  He agreed withholding of the Coumadin     initially.  Subsequently, he followed up with the patient and there     was no expansion of the hematoma and he recommended that the     Coumadin be resumed and this was done.  PT/OT was consulted and saw     the patient, initially they recommended home health PT with DME,     but subsequently on followup the recommended a rehab consult and     inpatient rehab recommended skilled nursing instead and the patient     is awaiting placement at this time. Supratherapeutic INR - as discussed above.  Her Coumadin was reversed as above until Orthopedics followed and recommended that it will be restarted as the hematoma was remaining  stable.  INR today is 2.14. 1. Anemia - secondary to above.  The patient required transfusion of     packed red blood cells during this hospital stay and hemoglobin has     remained stable - 9.4 today. 2. Atrial fibrillation - her rate has remained controlled, again as     discussed above.  Her INR was held initially and subsequently     Orthopedics followed and indicated that it was okay to resume the     Coumadin and this was done and her INR is therapeutic today as     above with no evidence of hematoma expansion. 3. Hypertension - blood pressures were uncontrolled during this     hospital stay and her Prinivil has been increased to 30 mg p.o.     b.i.d. at this time. 4. Right wrist fracture - Orthopedics followed up with the patient     after the wrist x-ray done on April 9 and discussed surgical     options as well as the risks and benefits and following that     discussion, the patient declined surgery.  Dr. Victorino Dike indicated     that given the patient's age, functional demands, and     comorbidities, he agreed with the decision.  He stated that she     understood that she could have continued right wrist pain,     arthritis of the right wrist and stiffness after cast treatment.     The patient is to follow up with him at the office in 1 month.  Rounding hospitalist to dictate the rest of her hospital stay as well as the final discharge medications and disposition.     Kela Millin, M.D.     ACV/MEDQ  D:  08/06/2010  T:  08/06/2010  Job:  [id number]  Electronically Signed by Donnalee Curry M.D. on 08/17/2010 08:43:41 AM

## 2010-08-19 ENCOUNTER — Telehealth: Payer: Self-pay | Admitting: *Deleted

## 2010-08-19 NOTE — Telephone Encounter (Signed)
Patient was discharged from hospital on Thursday of last week. Angela Ali went to patients home today for PT/INR. Patient's INR is 1.6 and PT is 18.7. She is scheduled to check it again on the 26 th. She is asking if you would want her to continue going to house to check it. If so she will need a verbal order.

## 2010-08-19 NOTE — Telephone Encounter (Signed)
Make sure pt has hospital follow up appt. What dose of coumadin in she on now out of hospital. Will need to increase dose.  Yes.Marland Kitchen give verbal order to recheck in 1 week at home.

## 2010-08-20 NOTE — Telephone Encounter (Signed)
Patient called and is taken 2 mg daily now that she is out of the hospital patient  would like to know what to do patient did not take medication last night because she didn't know what to take, Please Advise

## 2010-08-20 NOTE — Telephone Encounter (Signed)
Patient advised.

## 2010-08-20 NOTE — Telephone Encounter (Signed)
2 mg daily of Coumadin Recheck as ordered by Dr. Ermalene Searing below.

## 2010-08-26 ENCOUNTER — Telehealth: Payer: Self-pay | Admitting: *Deleted

## 2010-08-26 NOTE — Telephone Encounter (Signed)
Increase to 5 mg T, TH and 2.5 mg on all other days.  Recheck in 1 week...call report.

## 2010-08-26 NOTE — Telephone Encounter (Signed)
Message left for nurse with instructions

## 2010-08-26 NOTE — Telephone Encounter (Signed)
Angela Ali calling with patient's PT/INR. INR is 1.3 and her PT 15.1. She is currently taking 2.5 mg daily. Please call with instructions.

## 2010-08-29 ENCOUNTER — Other Ambulatory Visit: Payer: Self-pay | Admitting: Family Medicine

## 2010-08-31 NOTE — H&P (Signed)
NAME:  Angela Ali, Angela Ali                 ACCOUNT NO.:  [id number]  MEDICAL RECORD NO.:  [id number]           PATIENT TYPE:  E  LOCATION:  [facility]                         FACILITY:  [facility]  PHYSICIAN:  [name], M.D.      DATE OF BIRTH:  Jul 26, 1928  DATE OF ADMISSION:  08/01/2010 DATE OF DISCHARGE:                             HISTORY & PHYSICAL   PRIMARY CARE PHYSICIAN:  Dr. Pattricia Boss.  PRESENTING COMPLAINT:  Right lower extremity swelling and color change.  HISTORY OF PRESENT ILLNESS:  The patient is a 77 year old female with known history of atrial fibrillation and some gait instability who apparently had a mechanical fall on July 23, 2010.  She was  escorted by her daughter to the doctor's office over at [facility] when she suddenly tripped and fell.  She sustained right wrist fracture and what was thought to be an open wound on the right lower extremity at that time.  The patient has been on Coumadin but at that time she only shows slight bruising.  She went home but the area started getting worse.  It became swollen, tender and very dark.  It was also red and some blister. She was initially thought to have a cellulitis but upon further investigation the patient was found to have a large hematoma.  Her INR also has gotten up to 5 and the patient has dropped her hemoglobin significantly since she was seen here on July 27, 2010.  During the evaluation on July 27, 2010, she was seen to have tricompartment osteoarthritis only but nothing significant and her head CT and the rest of the workup was within normal.  PAST MEDICAL HISTORY:  Significant for: 1. Hypertension. 2. Atrial fibrillation. 3. Diet-controlled diabetes. 4. Lower-extremity edema. 5. GERD. 6. Hyperlipidemia. 7. Chronic lower extremity edema.  ALLERGIES:  MORPHINE.  It is not clear what symptoms she gets with that.  SOCIAL HISTORY:  The patient lives in [place].  She has 8 children one of whom died.  All her  children are grown up and very helpful.  No tobacco, alcohol or IV drug use.  She is able to do her ADLs and even helps one of her children that is slightly disabled.  She however has problem with her gait and was supposed to have physical therapy, the patient was unable to make it.  She has no tobacco, alcohol or IV drug use.  FAMILY HISTORY:  Noncontributory.  REVIEW OF SYSTEMS:  Mainly pain.  Otherwise, all systems reviewed are negative except per HPI.  PHYSICAL EXAMINATION:  VITAL SIGNS:  Temperature 99.3, blood pressure 159/92, her pulse 119, respiratory rate 20, sats 97% on room air. GENERAL:  She is awake, alert, oriented.  She is in no acute distress, very pleasant woman. HEENT:  PERRL.  EOMI.  No pallor, no jaundice.  No rhinorrhea. NECK:  Supple.  No JVD, no lymphadenopathy. RESPIRATORY:  She has good air entry bilaterally.  No wheezes or rales. CARDIOVASCULAR SYSTEM:  She is currently in sinus rhythm with S1-S2, no murmur. ABDOMEN:  Obese, soft and nontender with positive bowel sounds. EXTREMITIES:  Right upper  extremity is immobilized in a cast.  The right lower extremity showed a large area of hematoma on the anterolateral aspect of her leg region.  There is redness and blood blister.  There are 2+ PD and DP pulses.  There is significant color change mainly dark blue in the area. SKIN:  No other rashes or ulcers.  No other signs of injuries or hematomas. MUSCULOSKELETAL:  Again, mainly the hematomas on the right wrist fracture which is immobilized.  LABORATORY DATA:  White count is 10.3, hemoglobin 8.8, it was 11.1 on July 27, 2010.  Her platelet count is 296.  Sodium 136, potassium 3.4, chloride 96, CO2 31, glucose 130, BUN 5, creatinine 0.60.  PT 48.2, INR 5.29.  X-ray of the right tibia-fibula showed persistent soft tissue mass of the size 9.3 cm in the anterolateral aspect of the proximal right lower leg which is consistent with hematoma.  EKG is  currently pending.  ASSESSMENT:  This is a 77 year old female status post fall presenting with large right lower extremity hematoma and also Coumadin coagulopathy.  Her hematoma must have gotten worse due to the worsening INR level.  The patient's INR is 5 in the setting of bleed means that it needs to be reversed to avoid further bleed.  In addition, she has acute blood loss anemia with her hemoglobin dropping from 11.1-8.8 in a matter of 5 days.  PLAN:  Therefore will be: 1. Right lower extremity hematoma.  We will admit the patient, put     some pressure on the site, reverse her INR.  Orthopedic has already     been consulted to followup with the patient for possible     intervention.  At this point, however, with low INR her hematoma     may be able to resolve spontaneously. 2. Coagulopathy.  Again, INR is 5.29.  I will hold the Coumadin and     reverse the INR using FFP and vitamin K orally. 3. Anemia of blood loss.  The patient will type and cross match for 2     units of packed red blood cells.  We will follow her H and H     closely. 4. Atrial fibrillation.  She is currently in sinus rhythm.  We will     keep her on tele for monitoring.  Her rate has been control and she     has been on Coumadin doing fine. 5. Diabetes.  The patient is currently not taking anything at home.  I     will give sliding scale insulin while in the hospital. 6. Hypertension.  We will obtain her full home med list and continue     with her home medication. 7. Morbid obesity.  She seems to be stable at this point. 8. Right wrist fracture.  Again, this is immobilized.  We will     continue with the cast as well as pain control as needed. 9. Hypokalemia, seems transient.  I will put her on potassium     supplementation. 10.Gait instability.  The patient will probably benefit from short-     term skilled nursing facility.  I have discussed this with her and     her three daughters and they are  agreeable.  This will help prevent     future falls. 11.GERD.  I will put her on PPI in the hospital. 12.Hyperlipidemia.  Again, we will obtain her home medication.  She     was previously on simvastatin, it  is not she clear if she is still     taking that. 13.Chronic lower extremity edema.  At this point, there is no evidence     of edema.     [name], M.D.     Verlin Grills  D:  08/01/2010  T:  08/01/2010  Job:  [id number]  Electronically Signed by [name] M.D. on 08/31/2010 10:03:49 PM

## 2010-09-01 ENCOUNTER — Ambulatory Visit (INDEPENDENT_AMBULATORY_CARE_PROVIDER_SITE_OTHER): Payer: Medicare PPO | Admitting: Family Medicine

## 2010-09-01 ENCOUNTER — Other Ambulatory Visit (INDEPENDENT_AMBULATORY_CARE_PROVIDER_SITE_OTHER): Payer: Medicare PPO | Admitting: Family Medicine

## 2010-09-01 DIAGNOSIS — Z7901 Long term (current) use of anticoagulants: Secondary | ICD-10-CM

## 2010-09-01 DIAGNOSIS — E119 Type 2 diabetes mellitus without complications: Secondary | ICD-10-CM

## 2010-09-01 DIAGNOSIS — Z5181 Encounter for therapeutic drug level monitoring: Secondary | ICD-10-CM

## 2010-09-01 DIAGNOSIS — I4891 Unspecified atrial fibrillation: Secondary | ICD-10-CM

## 2010-09-01 LAB — HEPATIC FUNCTION PANEL
AST: 13 U/L (ref 0–37)
Albumin: 4 g/dL (ref 3.5–5.2)
Alkaline Phosphatase: 92 U/L (ref 39–117)
Bilirubin, Direct: 0.2 mg/dL (ref 0.0–0.3)

## 2010-09-01 LAB — BASIC METABOLIC PANEL
BUN: 6 mg/dL (ref 6–23)
CO2: 32 mEq/L (ref 19–32)
Calcium: 10 mg/dL (ref 8.4–10.5)
Creatinine, Ser: 0.6 mg/dL (ref 0.4–1.2)
Glucose, Bld: 116 mg/dL — ABNORMAL HIGH (ref 70–99)

## 2010-09-01 LAB — LIPID PANEL
Total CHOL/HDL Ratio: 4
Triglycerides: 108 mg/dL (ref 0.0–149.0)

## 2010-09-01 NOTE — Patient Instructions (Signed)
2.5 mg daily, except 5 mg MWF, check in two weeks

## 2010-09-02 ENCOUNTER — Encounter: Payer: Self-pay | Admitting: Family Medicine

## 2010-09-02 ENCOUNTER — Ambulatory Visit (INDEPENDENT_AMBULATORY_CARE_PROVIDER_SITE_OTHER): Payer: Medicare PPO | Admitting: Family Medicine

## 2010-09-02 VITALS — BP 130/88 | HR 56 | Temp 98.5°F | Ht 64.5 in | Wt 205.0 lb

## 2010-09-02 DIAGNOSIS — S8010XA Contusion of unspecified lower leg, initial encounter: Secondary | ICD-10-CM

## 2010-09-02 DIAGNOSIS — I4891 Unspecified atrial fibrillation: Secondary | ICD-10-CM

## 2010-09-02 DIAGNOSIS — R7309 Other abnormal glucose: Secondary | ICD-10-CM

## 2010-09-02 DIAGNOSIS — I10 Essential (primary) hypertension: Secondary | ICD-10-CM

## 2010-09-02 DIAGNOSIS — I509 Heart failure, unspecified: Secondary | ICD-10-CM

## 2010-09-02 NOTE — Patient Instructions (Signed)
Goal fasting blood sugar 80-120.  Too low is less 60.  2 hour after meals  <180. Check blood sugar at home. Continue health eating. Call if any redness spreading around

## 2010-09-02 NOTE — Progress Notes (Signed)
Subjective:    Patient ID: Angela Ali, female    DOB: 08/11/1928, 77 y.o.   MRN: [id number]  HPI 77 year old female presents for hospital follow up following a fall on 08/01/2010. She was discharged on 4/12 to SNF.  DISCHARGE DIAGNOSES:  1. Status post mechanical fall, July 23, 2010.  2. Comminuted right wrist fracture,  Surgical intervention, not indicated per ORTHO Dr. Victorino Dike. 3. Coumadin-induced coagulopathy.  4. Right lower extremity hematoma: This was secondary to mechanical  fall, against a background of Coumadin-induced coagulopathy with an  INR of 5.9. Anticoagulation was reversed and per Orthopedic  recommendation, compressive wrap was applied around the right lower  extremity. The patient was managed on analgesics. Anticoagulation  was held, with improvement in hematoma, which is anticipated  continued to resolve. Discharge  INR theraputic at 2.88 on 4/12 5. Acute blood loss anemia secondary to above. Required transfusion  of PRBCs during this hospitalization.  She was admitted rehab for 1 week. Has completed home PT and OT.  DM, HTN remained well controlled throughout stay. Lost 30 lbs inhospital and rehab because she does not like food  Saw Dr. Garey Ham yesterday... Right radial fracture healing well. Out of cast. Now in brace. Has follow up in 10/01/2010. Has some pain in hand...cannot take tramadol because it makes her feel woozy. Hematoma decreasing in size, tender.    Last INR here yesterday 1.7.Marland KitchenMarland Kitchenincreased coumadin.. Recheck in 1 week. A1C and chol both better controlled    Review of Systems As in HPI, otherwise no pertinent positives or negatives.     Objective:   Physical Exam  Constitutional: Vital signs are normal. She appears well-developed and well-nourished. She is cooperative.  Non-toxic appearance. She does not appear ill. No distress.       Overweight female in NAD   HENT:  Head: Normocephalic.  Right Ear: Hearing, tympanic membrane,  external ear and ear canal normal. Tympanic membrane is not erythematous, not retracted and not bulging.  Left Ear: Hearing, tympanic membrane, external ear and ear canal normal. Tympanic membrane is not erythematous, not retracted and not bulging.  Nose: No mucosal edema or rhinorrhea. Right sinus exhibits no maxillary sinus tenderness and no frontal sinus tenderness. Left sinus exhibits no maxillary sinus tenderness and no frontal sinus tenderness.  Mouth/Throat: Uvula is midline, oropharynx is clear and moist and mucous membranes are normal.  Eyes: Conjunctivae, EOM and lids are normal. Pupils are equal, round, and reactive to light. No foreign bodies found.  Neck: Trachea normal and normal range of motion. Neck supple. Carotid bruit is not present. No mass and no thyromegaly present.  Cardiovascular: Normal rate, regular rhythm, S1 normal, S2 normal, intact distal pulses and normal pulses.  Exam reveals no gallop and no friction rub.   Murmur heard.  Systolic murmur is present with a grade of 2/6  Pulmonary/Chest: Effort normal and breath sounds normal. Not tachypneic. No respiratory distress. She has no decreased breath sounds. She has no wheezes. She has no rhonchi. She has no rales.  Abdominal: Soft. Normal appearance and bowel sounds are normal. There is no tenderness.  Neurological: She is alert.  Skin: Skin is warm, dry and intact. No rash noted.       Right anterior lower leg with very large hematoma, with opening of skin superficially..10 inches in length and 6 inches in diameter  Psychiatric: Her speech is normal and behavior is normal. Judgment and thought content normal. Her mood appears not anxious. Cognition and  memory are normal. She does not exhibit a depressed mood.          Assessment & Plan:

## 2010-09-04 ENCOUNTER — Other Ambulatory Visit: Payer: Self-pay | Admitting: Family Medicine

## 2010-09-05 ENCOUNTER — Ambulatory Visit: Payer: Self-pay | Admitting: Family Medicine

## 2010-09-09 NOTE — Assessment & Plan Note (Signed)
[facility] OFFICE NOTE   NAME:COOPERFrayda, Egley                    MRN:          [id number]  DATE:07/03/2008                            DOB:          18-Oct-1928    Ms. Schuchart comes in today for further management of following issues.  1. Chronic atrial fibrillation with a well-controlled ventricular      rate.  2. Anticoagulation.  3. Hypertension.  4. Lower extremity edema.   Unfortunately, she has gained another 8 pounds since I last saw her.  Her swelling has gotten worse.  She denies any orthopnea or PND.   She is totally asymptomatic with her atrial fib.   She has a multitude of complaints today including being very anxious and  stressed, having a dry cough which she saw Dr. Dallas Schimke for the other  day who placed her on some doxycycline, Mucinex, and some Claritin.   Unfortunately, she has a very poor understanding of diet.  She says she  needs somebody to talk to.  We will try to arrange for her to see a  dietician if she will be willing to pay for it.   Her current meds are:  1. Furosemide 40 mg a day.  2. Warfarin as directed.  3. Cartia XT 300 mg a day.  4. Metoprolol 150 mg a day.  5. Alendronate 70 mg weekly.  6. Lisinopril 40 mg a day.  7. Simvastatin 80 mg a day.  8. Doxycycline 100 mg b.i.d.  9. Omeprazole 40 mg a day.  10.Fluticasone spray p.r.n.   She has not purchased the Mucinex or the Claritin yet.   Her blood pressure today is 156/92, her heart rate 60-70 and irregular.  EKG confirms atrial fib with nonspecific changes.  This is stable.  Her  weight is up to 238.  She is in no acute distress.  She is grossly  overweight, alert and oriented x3.  Affect is normal.  HEENT is normal.  Neck is supple.  Carotid upstrokes were equal bilaterally without  bruits.  I could not assess JVD.  Thyroid is not enlarged.  Lungs are  clear to auscultation and percussion.  Heart reveals an  irregular rate  and rhythm.  No obvious gallop.  Abdominal exam is obese with good bowel  sounds.  Organomegaly cannot be adequately assessed.  Extremities reveal  2+ pitting edema pretibially.  Pulses are palpable.  There is no sign of  DVT.  Skin is unremarkable.   ASSESSMENT:  Poorly controlled blood pressure.  I note that her blood  pressure was 180/80 yesterday in Dr. Durel Salts office.  Much of this is  fluid dependent not to mention diet dependent.   PLAN:  1. Increase furosemide to 80 mg p.o. q.a.m.  2. Followup [facility] next week to see if she needs potassium repletion or      replacement.  Her last potassium was normal.  3. Salt restriction.  4. Dietary consult.   In regard to her atrial fib, it is stable.  I have made no changes.     Thomas C.  Daleen Squibb, MD, [facility]  Electronically Signed    TCW/MedQ  DD: 07/03/2008  DT: 07/03/2008  Job #: [id number]

## 2010-09-09 NOTE — Assessment & Plan Note (Signed)
[facility] OFFICE NOTE   NAME:[name], [name]                    MRN:          [id number]  DATE:12/27/2007                            DOB:          1928-10-05    [name] returns today for management of following cardiac issues.  1. Chronic atrial fib.  Her ventricular rate is now well controlled on      metoprolol 150 daily.  She is in the 60s today.  She denies any      tachy palpitations.  2. Hypertension.  She says that she rarely checks her blood pressures      and it is variable.  Sometimes it is 120 and sometimes it is 160.      She continues to gain weight and has had increased swelling.  3. Nonischemic cardiomyopathy, EF of 40 to 45%.  4. Chronic combined congestive heart failure.  5. Obesity.  6. Hyperlipidemia.   She continues to have fatigue.  She says she is really having troubles  walking and really wants some comfortable shoes.  I recommended SAS.   MEDICATIONS:  1. Furosemide 40 mg a day.  2. Warfarin as directed, which is being followed at [facility] XT 3 mg a day, metoprolol 150 mg a day, Prevacid 30 mg a      day, alendronate 70 mg every week, lisinopril 40 mg a day, and      simvastatin 80 mg a day.   PHYSICAL EXAMINATION:  VITAL SIGNS:  Her blood pressure is 169/94, pulse  is 66 and irregular.  EKG shows atrial fib, low voltage no change.  Weight is up to 230 from 227.  GENERAL:  She is in no acute distress.  Skin is warm and dry.  Alert and  oriented x3, very pleasant humorous.  HEENT:  Unchanged.  NECK:  No JVD.  Carotids are full without bruits.  LUNGS:  Clear.  HEART:  An irregular rate and rhythm, well-controlled rate.  ABDOMINAL:  Obese with good bowel sounds.  Organomegaly was difficult to  assess.  EXTREMITIES:  A 1+ pitting edema pretibially.  Pulses are intact.  NEUROLOGIC:  Intact.   ASSESSMENT AND PLAN:  [name] is stable from our standpoint.  I  am  concerned about her increasing weight, decreased activity, and blood  pressure.   I checked her blood pressure on the way out today and was 140/80 with  large cuff in the left arm.  She had not taken her furosemide this  morning.  I made no changes.  I have encouraged  her to try to keep her weight down and to be compliant with medications.  We will plan on seeing her back again in 6 months.     Thomas C. Daleen Squibb, MD, [facility]  Electronically Signed    TCW/MedQ  DD: 12/27/2007  DT: 12/28/2007  Job #: [id number]

## 2010-09-09 NOTE — Assessment & Plan Note (Signed)
[facility]                            CARDIOLOGY OFFICE NOTE   NAME:COOPERJulian, Askin                    MRN:          [id number]  DATE:04/07/2007                            DOB:          06-23-1928    Ms. Streets returns today for further management of the following issues:  1. Chronic atrial fibrillation.  2. Mild left ventricular systolic dysfunction, ejection fraction 40% -      45%.  3. Chronic diastolic congestive heart failure.  4. Left ventricular hypertrophy.  5. Mild pulmonary hypertension.  6. Hypertension.  7. Obesity.  8. Hyperlipidemia.   Her biggest complaint today is just not having enough stamina or running  out of energy with trying to do something.  She just feels tired all  over.  Sometimes she will feel a little shortness of breath or fullness  in her chest.   She seems to be compliant with her medications.   She denies any orthopnea, PND, but she has noticed some increased edema  since returning from a trip from [place]; she rode in a car.   MEDICATIONS:  1. Prevacid 30 mg a day.  2. Furosemide 20 mg a day.  3. Cartia XT 300 mg a day.  4. Warfarin as instructed.  5. Metoprolol succinate 50 mg a day.  6. Lisinopril 20 mg a day.  7. Alendronate 20 mg q.week.  8. Vytorin 10/40 nightly.   EXAMINATION:  Her blood pressure is 152/98, her pulse is 100 and  irregular, her weight is 222 which is actually up 3 pounds.  HEENT:  Normocephalic/atraumatic, PERRLA, extraocular movements intact,  sclerae are clear, facial symmetry is normal.  NECK:  Shows no obvious JVD, carotids are full bilaterally without  bruits, there is no thyromegaly, her trachea is midline.  LUNGS:  Clear.  HEART:  Reveals a slightly increased rate and rhythm at about 100 beats  per minute, her PMI is poorly appreciated, there is no S3 gallop.  ABDOMEN:  Soft with good bowel sounds, organomegaly can not be  appreciated.  EXTREMITIES:  Reveal 2+  pretibial edema, right worse than left.  There  is no sign of DVT.  NEUROLOGIC:  Intact.   EKG shows atrial fibrillation with low voltage, heart rate of about 100  beats per minute.   ASSESSMENT:  1. Atrial fibrillation with a suboptimal control with ventricular      rate.  2. Hypertension.  3. Chronic diastolic heart failure.   PLAN:  1. Increase furosemide to 40 mg a day.  2. Increase metoprolol succinate to 100 mg per day for better rate      control.  3. Increased lisinopril to 40 mg a day.  4. Check CHEM-7 today.   I will see her back in about 2-3 weeks.     Thomas C. Daleen Squibb, MD, [facility]  Electronically Signed    TCW/MedQ  DD: 04/07/2007  DT: 04/08/2007  Job #: [id number]   cc:   Kerby Nora, MD

## 2010-09-09 NOTE — Assessment & Plan Note (Signed)
[facility]                            CARDIOLOGY OFFICE NOTE   NAME:COOPERAnnika, Selke                    MRN:          [id number]  DATE:04/19/2007                            DOB:          1929-03-24    Ms. Browning comes in today for follow-up of her atrial fib with an  uncontrolled ventricular rate.  She is still having significant dyspnea  on exertion.  Her daughter is with her today and confirms this.  Just  coming into the office she was short of breath.   Her other medical problems include:  1. Mild left ventricular systolic dysfunction with EF of 45%.  2. Chronic diastolic congestive heart failure.  3. Left ventricular hypertrophy.  4. Mild pulmonary hypertension.  5. Hypertension.  6. Obesity.  7. Hyperlipidemia.   She has not had a stress Myoview since 1999.   Last visit I increased her metoprolol succinate from 50 to 100 mg a day.  Her heart rate has decreased from a resting heart rate of 97 to 84  today.  Her blood pressure is still high at 153/104.  Her weight is 219,  down three.  HEENT:  Unchanged.  Carotids were equal bilaterally without bruits, no  JVD, thyroid is not enlarged, trachea is midline.  LUNGS:  Clear.  HEART:  Reveals a poorly appreciated PMI, she has an irregular rate and  rhythm.  ABDOMINAL:  Obese, organomegaly could not be assessed.  EXTREMITIES:  Reveal no edema, Pulses are intact.  NEURO:  Exam is intact.   I had a long talk, greater than 20 minutes with Ms. Docter and her  daughter.  I have recommended the following  1. Increase metoprolol succinate to 150 mg p.o. q.a.m.  2. Adenosine rest stress Myoview.  3. ProTime today since she was started on Septra by Dr. Zachery Dakins for      a boil that he drained last Friday.  There is a significant      interaction between that and Coumadin.  She is also on      metronidazole.  She will also need a [facility]Time at [facility] by      Monday of next week.   I will have  her come back after her stress Myoview.  We may have to  increase her metoprolol to 200 mg a day.     Thomas C. Daleen Squibb, MD, [facility]  Electronically Signed    TCW/MedQ  DD: 04/19/2007  DT: 04/19/2007  Job #: [id number]   cc:   Kerby Nora, MD

## 2010-09-09 NOTE — Assessment & Plan Note (Signed)
[facility] OFFICE NOTE   NAME:COOPERLysette, Angela Ali                    MRN:          [id number]  DATE:10/24/2007                            DOB:          October 05, 1928    Angela Ali comes in today for further management of her cardiac issues.   PROBLEM LIST:  1. Chronic atrial fibrillation with well-controlled ventricular rate.      This necessitated increasing metoprolol to 150 mg a day.  2. Hypertension.  This has been under better control with increasing      of her lisinopril to 40 mg a day and her furosemide to 40 mg day.      Unfortunately, she is back on 20 mg a day probably because of old      prescription.  Her weight is up about 8 pounds.  3. Nonischemic cardiomyopathy.  Her ES 40-45%.  4. Chronic combined congestive heart failure.  5. Obesity.  6. Hyperlipidemia.   Her biggest complaint is just fatigue.  She is having no angina, no  orthopnea, PND.  She has noted some swelling in her hands and feet. Note  the furosemide dose above.   CURRENT MEDICATION:  1. Warfarin 5 mg a day.  2. Cardia XT 300 mg a day.  3. Metoprolol 150 mg a day.  4. Prevacid 30 mg a day.  5. Alendronate 70 mg q. week.  6. Lisinopril 40 mg.  7. Simvastatin 80 mg a day.  8. Furosemide 20 mg a day.   PHYSICAL EXAMINATION:  VITALS:  Blood pressure today is 152/92, her  pulse is 60 and regular.  Her weight is 227, up 9 pounds.  HEENT:  Unchanged from previous exam.  Carotid upstrokes were equal  bilaterally without bruits.  There is no obvious JVD.  Thyroid was not  enlarged.  Trachea is midline.  LUNGS:  Clear to auscultation.  HEART:  Reveals a poorly appreciated PMI, normal S1 and S2.  No gallop.  ABDOMEN:  Soft, obese, good bowel sounds.  EXTREMITIES:  Reveal 1+ pitting edema.  Pulses are intact.  NEURO:  Grossly intact.   Angela Ali is fatigued, it is multifactorial.  I am most concerned about  her increased blood  pressure and her weight gain.  She was taking the  wrong dose of furosemide which we have changed today to 40 mg a day and  rewritten her prescription.  I have encouraged her to join weight  watchers, which she brought up and cut back on her calories in general.  She has also been told to watch sodium.   We will plan on seeing her back in about 6 weeks for close followup.      Thomas C. Wall, MD, [facility]  Electronically Signed     Jesse Sans. Daleen Squibb, MD, [facility]  Electronically Signed   TCW/MedQ  DD: 10/24/2007  DT: 10/25/2007  Job #: [id number]   cc:   Kerby Nora, MD

## 2010-09-09 NOTE — Assessment & Plan Note (Signed)
[facility] OFFICE NOTE   NAME:Angela Ali, Angela Ali                    MRN:          [id number]  DATE:05/04/2007                            DOB:          [birth year]    The patient returns today for follow-up of her chronic atrial  fibrillation with increased ventricular rate.  She was also having  dyspnea on exertion.   We increased her metoprolol to 150 mg a day.  Because of some lower  extremity edema, we increased her furosemide to 40 mg a day.  Her blood  pressure was not under optimal control, so we increased her lisinopril  to 40 mg a day.   A stress Myoview was done to rule out any obstructive coronary artery  disease.  Her EF was 39% with global hypokinesia, but no obvious  ischemia.  There was no sign of infarction as well.   Her previous EF was around 40-45% before we met her by two-dimensional  echocardiogram earlier in the year.   She feels remarkably better.  Her shortness of breath is improved.   PHYSICAL EXAMINATION:  VITAL SIGNS:  Blood pressure 144/86 which is  better, heart rate is 80 and irregular apically.  Weight is 218, which  is down 1 pound.  NECK:  Carotids are full.  No JVD.  LUNGS:  Clear to auscultation.  HEART:  Irregular rate and rhythm.  No gallop.  ABDOMEN:  Soft with good bowel sounds.  EXTREMITIES:  1+ edema.  Pulses are intact.  NEUROLOGY:  Intact.  She is alert and oriented x3.  SKIN:  Warm and dry.   I have reviewed everything with the patient.  I am pleased that her  heart rate has dropped as has her blood pressure.  I am also pleased  with the results of her Myoview.  I have asked her to continue with her  current program and try to watch salt and calories.  I will plan on  seeing her back in six months.     Thomas C. Daleen Squibb, MD, [facility]  Electronically Signed    TCW/MedQ  DD: 05/04/2007  DT: 05/04/2007  Job #: [id number]   cc:   Kerby Nora, MD

## 2010-09-09 NOTE — Letter (Signed)
January 17, 2007    Kerby Nora, MD  [address]  [place],  [place] [address]   RE:  Angela Ali, Angela Ali  MRN:  [id number]  /  DOB:  06/16/1928   Dear Dr. Ermalene Searing:   Upon your kind referral, I had the pleasure of evaluating your patient  and I am pleased to offer my findings.  I saw Angela Ali in the office  today.  Enclosed is a copy of my progress note that details my findings  and recommendations.   Thank you for the opportunity to participate in your patient's care.    Sincerely,      Barbette Hair. Arlyce Dice, MD,FACG  Electronically Signed    RDK/MedQ  DD: 01/17/2007  DT: 01/17/2007  Job #: [id number]

## 2010-09-09 NOTE — Assessment & Plan Note (Signed)
[facility]                         GASTROENTEROLOGY OFFICE NOTE   NAME:COOPERAiryanna, Angela Ali                    MRN:          [id number]  DATE:01/17/2007                            DOB:          March 21, 1929    GASTROENTEROLOGY CONSULTATION   REASON FOR CONSULTATION:  Abdominal and chest discomfort.   HISTORY OF PRESENT ILLNESS:  Angela Ali is a 77 year old African-  American female referred through the courtesy of Dr. Ermalene Searing for  evaluation.  She describes a heaviness and bubbling sensation in her  lower chest and upper abdomen.  She took Nexium which caused diarrhea.  With Prevacid symptoms have improved.  She has longstanding similar  symptoms.  She denies dysphagia, odynophagia, nausea, cough or sore  throat.   PAST MEDICAL HISTORY:  Pertinent for hypertension and arrhythmia's.  She  has arthritis.  Colon polyps were removed in May 2008.  She is status  post cholecystectomy.   FAMILY HISTORY:  Pertinent for daughter with ovarian cancer and two  daughter's with diabetes.   MEDICATIONS:  1. Prevacid.  2. Lasix.  3. Avelox.  4. Cardia.  5. Vytorin.  6. Coumadin.  7. Metoprolol.  8. Lisinopril.   ALLERGIES:  She has no allergies.   SOCIAL HISTORY:  She neither smokes nor drinks.  She is widowed and  retired.   REVIEW OF SYSTEMS:  Positive for back pain.   PHYSICAL EXAMINATION:  VITAL SIGNS:  Pulse 64 and irregularly,  irregular.  Blood pressure 140/70.  Weight is 219.  HEENT:  EOMI.  PERRLA.  Sclerae are anicteric.  Conjunctivae are pink.  NECK:  Supple without thyromegaly, adenopathy or carotid bruits.  CHEST:  Clear to auscultation and percussion without adventitious  sounds.  CARDIAC:  Regular rhythm; normal S1 S2.  There are no murmurs, gallops  or rubs.  ABDOMEN:  Bowel sounds are normoactive.  Abdomen is soft, nontender and  nondistended.  There are no abdominal masses, tenderness, splenic  enlargement or hepatomegaly.  EXTREMITIES:  Full range of motion.  No cyanosis, clubbing or edema.  RECTAL:  Deferred.   IMPRESSION:  1. Gastroesophageal reflux disease.  With longstanding      gastroesophageal reflux disease, Barrett's esophagus ought to be      ruled out.  2. History of colon polyps.  3. Atrial fibrillation - on Coumadin.   RECOMMENDATION:  1. Continue Prevacid.  2. Upper endoscopy to rule out Barrett's esophagus.  3. Followup colonoscopy in approximately 5 years.     Barbette Hair. Arlyce Dice, MD,FACG  Electronically Signed    RDK/MedQ  DD: 01/17/2007  DT: 01/17/2007  Job #: [id number]   cc:   Kerby Nora, MD

## 2010-09-09 NOTE — Assessment & Plan Note (Signed)
[facility] OFFICE NOTE   NAME:[name], Ali                    MRN:          [id number]  DATE:11/19/2006                            DOB:          1929-02-28    I was asked by Dr. Elder Love to consult and establish with Angela Ali as  her Cardiologist.  She carries a diagnosis of chronic atrial  fibrillation, now failed anti-arrhythmic therapy and DC cardioversion.   HISTORY OF PRESENT ILLNESS:  She is a very pleasant 77 year old [place]  American female, recently widowed in April 2008 when she lost her  husband to cancer, who has moved back from [place], [place] to [place].  She was being cared for at the [facility] by Dr. Elder Love.   She had been a former patient of Dr. Gerri Spore of our group.   She has a history of hypertensive heart disease with mild left  ventricular systolic impairment, EF of 40-45% by a recent 2D echo.  She  now has chronic atrial fibrillation treated with rate control and  anticoagulation.  Her last INR was November 12, 2006 at 2.6 in [place].   She has also had a history of some flutter as well.   She has had hypertension for a number of years.  She is overweight.  She  has suffered from chronic lower extremity edema.  She has hyperlipidemia  as well.   Other than just some swelling and just some generalized fatigue she is  stable.   PAST MEDICAL HISTORY:  MEDICATIONS:  1. Warfarin 5 mg a day.  2. Lisinopril 20 mg a day.  3. Furosemide 20 mg a day.  4. Vytorin 10/40 nightly.  5. Metoprolol 50 mg a day.  6. Cartia XT 300 mg a day.  7. Nexium 40 mg a day.   SHE HAS NO KNOWN DRUG ALLERGIES.   OTHER MEDICAL CONDITIONS INCLUDE:  Cyst tumor removed from her back in the past, she has had a left adrenal  gland removed in 2006.   FAMILY HISTORY:  Pertinent for her mother dying of heart disease and  high blood pressure at age 50.   SOCIAL HISTORY:  She is retired as of Research officer, trade union.   She just lost her husband.  She has 8 children.  She does not smoke or drink.   REVIEW OF SYSTEMS:  Other than the HPI she has a hiatal hernia, some  chronic arthritis and anxiety.   EXAMINATION:  She is very pleasant.  Her blood pressure is 146/92.  Because of a history of a little bit of dizziness, we checked  orthostatics.  She was not orthostatic.  Her height is 5 foot 5-1/2, she  weighs 219.  HEENT:  Normocephalic/atraumatic, PERRLA, extraocular movements intact,  sclerae are clear, facial symmetry is normal.  NECK:  Shows no obvious JVD, carotid upstrokes were equal bilaterally  without bruits, there is no thyromegaly, her trachea is midline.  LUNGS:  Clear.  HEART:  Reveals a variable rate and rhythm, PMI is poorly appreciated.  ABDOMINAL:  Soft, good bowel sounds, organomegaly could not be  appreciated.  EXTREMITIES:  Reveal 1-2+ pretibial edema, pulses are intact.  NEURO:  Exam is intact.  SKIN:  Unremarkable.   Electrocardiogram shows low voltage with atrial fibrillation.  Compared  to her outside ECG there has been no change.   Please note that she had a 2D electrocardiogram Sep 10, 2006 which  showed an EF of 40-45%, concentric left ventricular hypertrophy,  diastolic dysfunction, mild pulmonary hypertension with a indirect  measurement of 34 mmHg, normal right ventricular size and function and  no significant valve abnormalities.  A stress nuclear study on October 03, 2004 showed no ischemia.   ASSESSMENT/PLAN:  I have had a nice chat with Angela Ali today.  She is  on a good medical program and I have made no changes.   I have called the [facility] office for [employer] and will  establish her with either Dr. Alphonsus Sias or Dr. Hetty Ely.  She will need her  ProTimes monitored there as well.  I will plan on seeing her back again  in 6 months.     Thomas C. Daleen Squibb, MD, [facility]  Electronically Signed    TCW/MedQ  DD: 11/19/2006  DT: 11/19/2006  Job #: [id number]

## 2010-09-12 NOTE — H&P (Signed)
[facility]  Patient:    Angela Ali, Angela Ali                       MRN: [id number] Adm. Date:  12/19/99 Attending:  Ollen Gross, M.D. Dictator:   Alexzandrew L. Perkins, P.A.-C.                         History and Physical  DATE OF OFFICE VISIT HISTORY AND PHYSICAL:  December 17, 1999  CHIEF COMPLAINT:  Left hip pain and left wrist pain.  HISTORY OF PRESENT ILLNESS:  The patient is a 77 year old female who is being seen by Dr. Lequita Halt for left foot and ankle pain.  She has been seen and evaluated and it was felt she had a left posterior tibial tendon insufficiency.  This was following development of swelling along the medial aspect of her left ankle.  There was no trauma, accident or injury.  However, she did have an increase in the pain and sought treatment.  During followup, she also complained of some left wrist swelling.  The wrist was also evaluated by Dr. Lequita Halt and felt that she had a probable ganglion in this region which has become symptomatic and the patient was requesting treatment.  She was sent for an MRI of the ankle which did prove to be positive for a posterior tibial tendon with surrounding fluid density consistent with a tenosynovitis and a grade 1 partial tear.  It was felt that she would benefit from undergoing a tenosynovectomy with possible repair of the tibial tendon.  At that time, she will also undergo excision of the left wrist ganglion.  The risks and benefits of the procedure have been discussed with the patient and she elected to undergo surgical intervention.  ALLERGIES:  No known drug allergies.  CURRENT MEDICATIONS: 1. Celebrex 200 mg p.o. q.d. p.r.n. 2. Prevacid 30 mg one p.o. q.d. 3. Zocor 20 mg one p.o. q.h.s. 4. Chlorthalidone 25 mg one p.o. q.d. 5. Atenolol 25 mg one p.o. q.d. 6. Cartia XT 180 mg one p.o. q.d. 7. Premarin 0.625 mg one p.o. q.d. 8. Coumadin stop one week prior to surgery.  PAST MEDICAL HISTORY:   Paroxysmal atrial fibrillation, hypertension, post menopausal, hypercholesterolemia, hiatal hernia, occasional reflux, hemorrhoids.  PAST SURGICAL HISTORY:  Hysterectomy and excision of two fatty tumors.  SOCIAL HISTORY:  She is married, has eight children, denies use of tobacco products or alcohol products.  She is retired from [employer] in [place].  FAMILY HISTORY:  Mother deceased at age 3 with history of hypertension. Father deceased age 75 with a history of emphysema.  He was a smoker.  REVIEW OF SYSTEMS:  GENERAL:  No fevers, chills or night sweats.  NEUROLOGIC: No seizures, syncope or paralysis.  RESPIRATORY:  No shortness of breath, productive cough or hemoptysis.  CARDIOVASCULAR:  The patient does have a history of paroxysmal atrial fibrillation.  She states the last time she was checked she was in sinus rhythm.  No chest pain, angina or orthopnea.  GI: The patient does have a hiatal hernia with occasional reflux.  No nausea, vomiting, diarrhea or constipation.  She does have hemorrhoids, however, no recent blood or mucus in the stool.  GU:  No dysuria, hematuria or discharge. MUSCULOSKELETAL:  Pertinent to that of left ankle and the left wrist found in the history of present illness.  PHYSICAL EXAMINATION:  VITAL SIGNS:  Pulse 72,  respirations 16, blood pressure 130/82.  GENERAL:  The patient is a 77 year old African-American female, well-developed, well-nourished, who appears to be in no acute distress.  She is alert, oriented and cooperative.  She appears to be an average historian. Very pleasant at time of exam.  HEENT:  Normocephalic, atraumatic.  Pupils are round and reactive.  EOMs are intact.  Oropharynx is clear.  The patient does have upper and lower dentures noted.  NECK:  Supple.  CHEST:  Clear to auscultation and percussion, no rhonchi or rales.  HEART:  Regular rate and rhythm, without murmur.  ABDOMEN:  Soft, round, protuberant,  bowel sounds present.  RECTAL/BREASTS/GENITALIA:  Not done, not pertinent to present illness.  EXTREMITIES:  Significant to that of the left ankle.  She has tenderness noted around the left posterior tibial tendon.  She has some noted swelling in the left ankle.  Motor function is intact.  Sensation is intact.  Exam of the left wrist shows some mild swelling noted on the dorsum of the left wrist right in the base of the metacarpal region and also in the dorsal carpal region.  This is tender to palpation.  No obvious erythema, ecchymosis or bruising.  IMPRESSION: 1. Left posterior tibial tendon insufficiency. 2. Left wrist probable ganglion. 3. Paroxysmal atrial fibrillation. 4. Hypertension. 5. Postmenopausal 6. Hypercholesterolemia. 7. Hiatal hernia. 8. Occasional reflux. 9. Hemorrhoids.  PLAN:  The patient will be admitted to [facility] to undergo left posterior tibial tendon tenosynovectomy with possible repair and also excision of probable left wrist ganglion. DD:  12/19/99 TD:  12/19/99 Job: [id number]

## 2010-09-12 NOTE — Op Note (Signed)
[facility]  Patient:    Angela Ali, Angela Ali                    MRN: [id number] Proc. Date: 12/19/99 Adm. Date:  [id number] Attending:  Loanne Drilling                           Operative Report  PREOPERATIVE DIAGNOSES:  1. Left posterior tibial tenosynovitis.  2. Left wrist radial mass.  POSTOPERATIVE DIAGNOSES:  1. Left posterior tibial tenosynovitis.  2. Left wrist radial mass.  PROCEDURES:  1. Left posterior tibial tenosynovectomy.  2. Excision soft tissue mass, left wrist.  SURGEON:  Dr. Lequita Halt.  ASSISTANT:  Maud Deed, P.A.-C.  ANESTHESIA:  General.  ESTIMATED BLOOD LOSS:  Minimal.  DRAINS:  None.  TOURNIQUET TIME:  8 minutes at 250 on the left wrist and 10 minutes at 350 on the left lower extremity.  COMPLICATIONS:  None.  CONDITION:  Stable to recovery.  BRIEF CLINICAL NOTE:  Ms.  Ali is a 77 year old female with severe left ankle pain and swelling. She has posterior tibial tendon insufficiency and an MRI scan showed significant tenosynovitis with questionable bulbous changes and possible interstitial tear. She has failed nonoperative management including splinting and presents now for tenosynovectomy, possible tendon transfer. In addition, she has a painful mass on the radial side of her left wrist in the first dorsal compartment region. She had seen Dr. Teressa Senter and it was felt that there was a ganglion and excision was suggested. She wants to have both procedures done at the same time thus she presents now for the wrist mass excision as well as the tenosynovectomy.  DESCRIPTION OF PROCEDURE:  After successful administration of general anesthetic, a tourniquet was placed high on the left upper arm and left thigh. Both extremities were then prepped and draped in the usual sterile fashion. The leg is dressed first, extremity wrapped in Esmarch and tourniquet to inflated to 350. A posteromedial incision is made over the  course of the posterior tibial tendon. The skin is cut with a 15 blade through subcutaneous tissue to the level of the tendon sheath. The sheath is incised with a 15 blade. Fluid is immediately encountered. There is a tremendous amount of tenosynovitis surrounding the tendon and along the walls of the sheath. This is all debrided with a combination of a 10 blade and rongeur. The tendon did not have evidence of a tear. There was a bulbous thickening just posterior to the malleolus and this is excised down to normal appearing tendon. There is no thinning of the tendon anywhere throughout its course. Once all the tenosynovium is removed then the tourniquet is released and minor bleeding stopped with cautery. The tendon sheath is closed with interrupted #1 Vicryl. The subcu is closed with interrupted 2-0 Vicryl and subcuticular with running 4-0 monocryl. The area is clean and dry and Steri-Strips and a bulky sterile dressing applied.  The left wrist was then dressed and extremities wrapped in Esmarch and tourniquet was inflated to 250. The mass was palpated on the radial aspect of the wrist adjacent to the first dorsal compartment. An incision is made with the 15 blade. The superficial radial nerve is identified and protected. The mass was within the tendon sheath and the sheath was then opened and the mass consists of significant tenosynovium which is fluid filled. This is excised. The tendons in the first dorsal compartment were extremely tight  and the sheath was further released back to the core veins region and a small strip removed. The tendons were moving much freer and no evidence of anymore tenosynovium present. The entire radial side of the wrist is palpated and no other masses are noted. At this point, the tourniquet again is released and irrigated. The subcu was closed with interrupted 3-0 Vicryl, subcuticular with running 4-0 monocryl. The incision was cleaned and dried and  Steri-Strips applied and then 5 cc of 0.25% Marcaine are injected subcutaneously. A bulky sterile dressing is applied and a dorsal splint. The left lower extremity is then placed into a posterior splint also and the patient subsequently awakened and transported to recovery in stable condition. DD:  12/19/99 TD:  12/20/99 Job: [id number]

## 2010-09-15 ENCOUNTER — Ambulatory Visit: Payer: Medicare PPO

## 2010-09-15 DIAGNOSIS — S8010XA Contusion of unspecified lower leg, initial encounter: Secondary | ICD-10-CM | POA: Insufficient documentation

## 2010-09-15 NOTE — Assessment & Plan Note (Signed)
No fluid overload. Euvolemic today.

## 2010-09-15 NOTE — Assessment & Plan Note (Addendum)
IMproved control with recent weight loss in hospital.  Goal fasting blood sugar 80-120.  Too low is less 60. 2 hour after meals  <180. Check blood sugar at home.

## 2010-09-15 NOTE — Assessment & Plan Note (Addendum)
Stable. Followed by Cards. Supertheraputic INR when fall occurred reulting in hematoma, need for transfusion etc.  Will recheck INR today and through home health as needed.

## 2010-09-15 NOTE — Assessment & Plan Note (Signed)
Large right lower anterior leg. Pt reassure will take a long time to resolve. Followed bu Ortho surgeon as well.

## 2010-09-19 ENCOUNTER — Ambulatory Visit (INDEPENDENT_AMBULATORY_CARE_PROVIDER_SITE_OTHER): Payer: Medicare PPO | Admitting: Family Medicine

## 2010-09-19 DIAGNOSIS — Z7901 Long term (current) use of anticoagulants: Secondary | ICD-10-CM

## 2010-09-19 DIAGNOSIS — I4891 Unspecified atrial fibrillation: Secondary | ICD-10-CM

## 2010-09-19 DIAGNOSIS — Z5181 Encounter for therapeutic drug level monitoring: Secondary | ICD-10-CM

## 2010-09-26 ENCOUNTER — Encounter: Payer: Self-pay | Admitting: Cardiovascular Disease

## 2010-09-26 ENCOUNTER — Ambulatory Visit (INDEPENDENT_AMBULATORY_CARE_PROVIDER_SITE_OTHER): Payer: Medicare PPO | Admitting: Cardiovascular Disease

## 2010-09-26 VITALS — BP 168/98 | HR 78 | Ht 64.0 in | Wt 199.0 lb

## 2010-09-26 DIAGNOSIS — I10 Essential (primary) hypertension: Secondary | ICD-10-CM

## 2010-09-26 DIAGNOSIS — R7309 Other abnormal glucose: Secondary | ICD-10-CM

## 2010-09-26 DIAGNOSIS — E785 Hyperlipidemia, unspecified: Secondary | ICD-10-CM

## 2010-09-26 DIAGNOSIS — S8010XA Contusion of unspecified lower leg, initial encounter: Secondary | ICD-10-CM

## 2010-09-26 DIAGNOSIS — I509 Heart failure, unspecified: Secondary | ICD-10-CM

## 2010-09-26 DIAGNOSIS — I4891 Unspecified atrial fibrillation: Secondary | ICD-10-CM

## 2010-09-26 MED ORDER — HYDRALAZINE HCL 50 MG PO TABS: 50.0000 mg | ORAL_TABLET | Freq: Three times a day (TID) | ORAL | Status: DC

## 2010-09-26 NOTE — Progress Notes (Signed)
   Patient ID: Angela Ali, female    DOB: 16-Aug-1928, 77 y.o.   MRN: [id number]  HPI Comments: Angela Ali is a very pleasant 77 year old woman with a history of chronic atrial fibrillation, on warfarin, hypertension, chronic lower extremity edema, shortness of breath, who presents for routine followup.   On a previous visit,  we held her diltiazem and started clonidine for her lower extremity edema and for hypertension control. Holding the diltiazem, seemed to help her edema. She does have a fullness in her abdomen and believes it could be her hiatal hernia. This seems to worse with certain positions also comes on when she exerts herself. She recently fell in March and developed a significant right leg hematoma. This continues to heal and she has a bandage in place today. She has watched her diet over the past several months and her weight is down 35 pounds.  She reports the clonidine gives her a dry mouth and she does not take the morning dose. Should like to try an alternate medication No episodes of chest pain, lightheadedness or dizziness.   EKG shows atrial fibrillation with ventricular rate of 79 beats per minute, no significant ST or T wave changes      Review of Systems  Constitutional: Negative.        Decrease in weight  HENT: Negative.   Eyes: Negative.   Respiratory: Negative.   Cardiovascular: Positive for leg swelling.  Gastrointestinal: Negative.   Musculoskeletal: Negative.        Swollen right leg from hematoma, bandage in place.  Skin: Negative.   Neurological: Negative.   Hematological: Negative.   Psychiatric/Behavioral: Negative.   All other systems reviewed and are negative.    BP 168/98  Pulse 78  Ht 5\' 4"  (1.626 m)  Wt 199 lb (90.266 kg)  BMI 34.16 kg/m2   Physical Exam  Nursing note and vitals reviewed. Constitutional: She is oriented to person, place, and time. She appears well-developed and well-nourished.  HENT:  Head: Normocephalic.    Nose: Nose normal.  Mouth/Throat: Oropharynx is clear and moist.  Eyes: Conjunctivae are normal. Pupils are equal, round, and reactive to light.  Neck: Normal range of motion. Neck supple. No JVD present.  Cardiovascular: Normal rate, regular rhythm, S1 normal, S2 normal, normal heart sounds and intact distal pulses.  Exam reveals no gallop and no friction rub.   No murmur heard.      1+ edema on the left around the ankle, old scar noted from previous surgery. Bandage in place on the right upper shin, hematoma on the right lateral aspect is covered.  Pulmonary/Chest: Effort normal and breath sounds normal. No respiratory distress. She has no wheezes. She has no rales. She exhibits no tenderness.  Abdominal: Soft. Bowel sounds are normal. She exhibits no distension. There is no tenderness.  Musculoskeletal: Normal range of motion. She exhibits no edema and no tenderness.  Lymphadenopathy:    She has no cervical adenopathy.  Neurological: She is alert and oriented to person, place, and time. Coordination normal.  Skin: Skin is warm and dry. No rash noted. No erythema.  Psychiatric: She has a normal mood and affect. Her behavior is normal. Judgment and thought content normal.         Assessment and Plan

## 2010-09-26 NOTE — Patient Instructions (Signed)
Your physician has recommended you make the following change in your medication: START Hydralazine 50mg  three times a day.  Your physician has requested that you regularly monitor and record your blood pressure readings at home. Please use the same machine at the same time of day to check your readings. Please call our office in 2 weeks with your results.  Your physician recommends that you schedule a follow-up appointment in: 6 months

## 2010-09-27 NOTE — Assessment & Plan Note (Signed)
Chronic atrial fibrillation, rate controlled on warfarin. Recent complication from a fall and a large hematoma on her leg that is slowly healing.

## 2010-09-27 NOTE — Assessment & Plan Note (Signed)
She is not taking her clonidine on a consistent basis. Blood pressure is elevated today. Clonidine has caused her dry mouth. We will change to hydralazine 50 mg t.i.d. With close monitoring of her blood pressure. I've asked her to contact our office next week with her blood pressure numbers.

## 2010-09-27 NOTE — Assessment & Plan Note (Signed)
Currently with no symptoms of CHF. No shortness of breath. Edema on the left lower extremity is secondary to previous surgery.

## 2010-09-27 NOTE — Assessment & Plan Note (Signed)
We have encouraged continued exercise, careful diet management in an effort to lose weight. She has already lost 35 pounds and is doing well.

## 2010-09-27 NOTE — Assessment & Plan Note (Signed)
In the future, we have suggested that if she falls and injures herself, that she hold her warfarin until she has been evaluated. Per her report, she was continued on warfarin after she was discharged from the hospital and subsequently, her hematoma got worse.

## 2010-09-27 NOTE — Assessment & Plan Note (Signed)
Cholesterol is at goal on the current lipid regimen. No changes to the medications were made.  

## 2010-09-28 ENCOUNTER — Telehealth: Payer: Self-pay | Admitting: Physician Assistant

## 2010-09-28 NOTE — Telephone Encounter (Signed)
Called with c/o intense HA, since starting hydralazine 50 mg tid yest. Was able to only take 2 doses. BP 170/107 currently.  I suggested decreasing dose to 25 tid; however, she preferred to resume clonidine 0.1 mg bid, and d/c hydralazine altogether. I concurred, and advised her to call Dr Windell Hummingbird office in AM.

## 2010-09-29 ENCOUNTER — Other Ambulatory Visit: Payer: Self-pay | Admitting: *Deleted

## 2010-09-29 ENCOUNTER — Telehealth: Payer: Self-pay | Admitting: Emergency Medicine

## 2010-09-29 MED ORDER — ATORVASTATIN CALCIUM 80 MG PO TABS: 80.0000 mg | ORAL_TABLET | Freq: Every day | ORAL | Status: DC

## 2010-09-29 MED ORDER — OMEPRAZOLE 20 MG PO CPDR: 20.0000 mg | DELAYED_RELEASE_CAPSULE | Freq: Every day | ORAL | Status: DC

## 2010-09-29 NOTE — Telephone Encounter (Signed)
Please advise, see my note from today, 09/29/2010

## 2010-09-29 NOTE — Telephone Encounter (Signed)
Pt called complaining of a HA with taking her Hydralazine. She called the doctor line over the weekend and they stated to give [place] a call today. She hasn't taken that medication since Saturday. She has been taking her Clonidine and she states that she still feels tired on medication. She would like to know what you recommend. Stay on Clonidine or try another medication?

## 2010-09-29 NOTE — Telephone Encounter (Signed)
Could add isosorbide 30 mg daily to clonidine. Monitor BP for one week.  If she tolerates this ok, we might be able to decrease clonidine dose in 1/2 with two isosorbide next week.

## 2010-09-29 NOTE — Telephone Encounter (Signed)
Opened in error

## 2010-09-30 NOTE — Telephone Encounter (Signed)
Attempted to contact pt, LMOM TCB. Will also refer to phone msg from 09/29/10.

## 2010-10-01 NOTE — Telephone Encounter (Signed)
Spoke to pt and notified of msg below. She states she would like to just try the clonidine only at this time instead of changing BP meds too much. She does not have a BP cuff but states she will get one this week and will monitor pressures daily and call back in 1 week with results.

## 2010-10-07 ENCOUNTER — Telehealth: Payer: Self-pay | Admitting: *Deleted

## 2010-10-07 NOTE — Telephone Encounter (Signed)
Pt called with BP results. (We recently changed her from hydralazine that was causing HA to Clonidine. It was suggested she also try Isosorbide so we could cut back on Clonidine later, and she had chosen to only try Clonidine.) BP 6/6 172/88; 6/7 138/91; 6/8 147/86; 6/10 119/80; 6/11 138/87 and states her HR has stayed <100. She had a problem with Clonidine in the past making her feel tired, but she wanted to let Dr. Mariah Milling know that she is having no problems with at this time.

## 2010-10-08 ENCOUNTER — Encounter (HOSPITAL_BASED_OUTPATIENT_CLINIC_OR_DEPARTMENT_OTHER): Payer: Medicare PPO | Attending: Plastic Surgery

## 2010-10-08 DIAGNOSIS — Z7901 Long term (current) use of anticoagulants: Secondary | ICD-10-CM | POA: Insufficient documentation

## 2010-10-08 DIAGNOSIS — E119 Type 2 diabetes mellitus without complications: Secondary | ICD-10-CM | POA: Insufficient documentation

## 2010-10-08 DIAGNOSIS — I10 Essential (primary) hypertension: Secondary | ICD-10-CM | POA: Insufficient documentation

## 2010-10-08 DIAGNOSIS — J438 Other emphysema: Secondary | ICD-10-CM | POA: Insufficient documentation

## 2010-10-08 DIAGNOSIS — I4891 Unspecified atrial fibrillation: Secondary | ICD-10-CM | POA: Insufficient documentation

## 2010-10-08 DIAGNOSIS — L97809 Non-pressure chronic ulcer of other part of unspecified lower leg with unspecified severity: Secondary | ICD-10-CM | POA: Insufficient documentation

## 2010-10-08 DIAGNOSIS — Z79899 Other long term (current) drug therapy: Secondary | ICD-10-CM | POA: Insufficient documentation

## 2010-10-09 NOTE — Assessment & Plan Note (Unsigned)
[facility]  NAME:  Angela Ali, Angela Ali                 ACCOUNT NO.:  [id number]  MEDICAL RECORD NO.:  [id number]      DATE OF BIRTH:  [birth year]  PHYSICIAN:  Wayland Denis, DO       VISIT DATE:  10/08/2010                                  OFFICE VISIT   CHIEF COMPLAINT:  Right lower extremity wound.  HISTORY OF PRESENT ILLNESS:  Angela Ali is here with her son and her granddaughter.  She is a 77 year old black female who sustained an injury to her right lower extremity anterior shin area when she fell going into the doctor's office.  She has been using dry dressings over the past week, but because she is on Coumadin she bled under the skin which then necrosed and she has now a resulting large wound.  The wound size is 9.8 x 7.7 and nearly 2 cm thick and had a very thick hard black eschar over it and included skin fat and subcutaneous tissue down to muscle fascia.  It does not appear to be infected.  There was no purulence, dried, or hemolyzed blood.  PAST MEDICAL HISTORY:  Positive for: 1. Emphysema. 2. High blood pressure. 3. Chronic atrial fibrillation. 4. Diabetes. 5. Radius fracture that occurred at the same time and low back pain     with lumbar radiculopathy.  MEDICATIONS:  Metoprolol, clonidine, atorvastatin, digoxin, omeprazole, Colace, tramadol, vitamin D, vitamin C, cod liver oil, Coumadin, lisinopril and Lasix.  ALLERGIES:  No known drug allergies.  REVIEW OF SYSTEMS:  She denies any significant change in her weight, hearing, vision, difficulty breathing.  Change in her cardiac status, chest pain, blood in her urine or her stool or change in her social system situation.  SOCIAL HISTORY:  She lives at home.  She has excellent support.  She has eight children, many are in the area and active in her care.  Her youngest son is with her today and available.  She does not smoke and she is retired.  PHYSICAL EXAMINATION:  GENERAL:  She is alert,  oriented, and cooperative in no acute distress.  She is very pleasant.  She seems to be a good historian. HEENT:  Her pupils are equal.  Extraocular muscles are intact. NECK:  No cervical lymphadenopathy. LUNGS:  Clear. HEART:  Irregular pulses are strong in the upper and lower extremities and varicosities. ABDOMEN:  Soft, nontender. EXTREMITIES:  In the right lower extremity, hematoma was debrided down to muscle fascia.  ASSESSMENT:  Right lower extremity traumatic injury.  PLAN:  Wet-to-moist normal saline dressing changes for the next week and we will see what it looks like.     Wayland Denis, DO     CS/MEDQ  D:  10/08/2010  T:  10/09/2010  Job:  [id number]  cc:   Kerby Nora, MD Toni Arthurs, MD

## 2010-10-14 ENCOUNTER — Telehealth: Payer: Self-pay | Admitting: *Deleted

## 2010-10-14 NOTE — Telephone Encounter (Signed)
Pt is asking if she can change from lipitor to something else.  She takes 80 mg's and the pills are too large for her to swallow.  I suggested she take a lower dose but she will have to take more of them, she doesn't care, she just wants something else.  Also, she is asking if she should take B12.  [facility] [address].

## 2010-10-14 NOTE — Telephone Encounter (Signed)
To AEB. 

## 2010-10-14 NOTE — Telephone Encounter (Signed)
She can start taking B12 OTC 1000 mcg daily. Add B12 measurement to her next scheduled labs ( should have DM labs scheduled, let me know if not) Call pharmacist to make sure lipitor 40 mg tabs daily are smaller.. If so okay to change her prescription to lipitor 40 mg, 2 tabs daily, refill as needed.

## 2010-10-15 MED ORDER — ATORVASTATIN CALCIUM 40 MG PO TABS: 80.0000 mg | ORAL_TABLET | Freq: Every day | ORAL | Status: DC

## 2010-10-15 NOTE — Telephone Encounter (Signed)
Patient advised and rx sent to pharmacy  

## 2010-10-16 ENCOUNTER — Encounter: Payer: Self-pay | Admitting: Cardiovascular Disease

## 2010-10-16 NOTE — Assessment & Plan Note (Unsigned)
[facility]  NAME:  Angela Ali, Angela Ali                 ACCOUNT NO.:  [id number]  MEDICAL RECORD NO.:  [id number]      DATE OF BIRTH:  03/26/1929  PHYSICIAN:  Wayland Denis, DO       VISIT DATE:  10/15/2010                                  OFFICE VISIT   Angela Ali is a 77 year old black female here with her son and her granddaughter for followup of her right lower extremity traumatic ulcer. She has been doing wet-to-dry with home health over the past week and is markedly improved.  She had a huge hematoma with an eschar last week that was debrided.  She is now granulating.  There is no sign of infection.  There is no change in her medical history or social history or medications.  PHYSICAL EXAMINATION:  GENERAL:  She is alert, oriented, and cooperative, in no acute distress.  She is very pleasant.  She has a lot of family support. HEENT:  Her pupils are equal.  Extraocular muscles intact. NECK:  No cervical lymphadenopathy. LUNGS:  Clear. HEART:  Regular. ABDOMEN:  Soft. EXTREMITIES:  Lower extremity ulcers as described, ascites as described, much improved from last week.  There is some areas of granulation. There is no sign of infection.  The plan is to do Santyl, hydrogel, foam, Kerlix, and netting for the week and we will see if we can get the wax sponge for her in order to help get this healed faster.     Wayland Denis, DO     CS/MEDQ  D:  10/15/2010  T:  10/16/2010  Job:  [id number]

## 2010-10-17 ENCOUNTER — Other Ambulatory Visit: Payer: Self-pay | Admitting: *Deleted

## 2010-10-17 ENCOUNTER — Ambulatory Visit (INDEPENDENT_AMBULATORY_CARE_PROVIDER_SITE_OTHER): Payer: Medicare PPO | Admitting: Internal Medicine

## 2010-10-17 DIAGNOSIS — Z79899 Other long term (current) drug therapy: Secondary | ICD-10-CM

## 2010-10-17 DIAGNOSIS — Z7901 Long term (current) use of anticoagulants: Secondary | ICD-10-CM

## 2010-10-17 LAB — POCT INR: INR: 2.5

## 2010-10-17 NOTE — Telephone Encounter (Signed)
[facility]  They have faxed forms for new prescriptions, forms are on your desk.  These will need to be faxed back in.  Tramadol is being asked for but this is no longer on pt's med list.

## 2010-10-17 NOTE — Patient Instructions (Signed)
CONTINUE 2.5 MG DAILY, 5 MG M/W/F

## 2010-10-19 ENCOUNTER — Other Ambulatory Visit: Payer: Self-pay | Admitting: Family Medicine

## 2010-10-19 MED ORDER — DIGOXIN 125 MCG PO TABS: 125.0000 ug | ORAL_TABLET | Freq: Every day | ORAL | Status: DC

## 2010-10-19 MED ORDER — METOPROLOL SUCCINATE ER 200 MG PO TB24: 200.0000 mg | ORAL_TABLET | Freq: Every day | ORAL | Status: DC

## 2010-10-19 MED ORDER — LISINOPRIL 40 MG PO TABS: 40.0000 mg | ORAL_TABLET | Freq: Every day | ORAL | Status: DC

## 2010-10-19 MED ORDER — OMEPRAZOLE 20 MG PO CPDR: 20.0000 mg | DELAYED_RELEASE_CAPSULE | Freq: Every day | ORAL | Status: DC

## 2010-10-19 MED ORDER — CLONIDINE HCL 0.2 MG PO TABS: 0.2000 mg | ORAL_TABLET | Freq: Two times a day (BID) | ORAL | Status: DC

## 2010-10-19 MED ORDER — FUROSEMIDE 80 MG PO TABS: 80.0000 mg | ORAL_TABLET | Freq: Every day | ORAL | Status: DC

## 2010-10-19 MED ORDER — WARFARIN SODIUM 5 MG PO TABS: ORAL_TABLET | ORAL | Status: DC

## 2010-10-19 MED ORDER — ATORVASTATIN CALCIUM 40 MG PO TABS: 80.0000 mg | ORAL_TABLET | Freq: Every day | ORAL | Status: DC

## 2010-10-19 NOTE — Telephone Encounter (Signed)
Will complete faxed forms to approve.

## 2010-10-21 ENCOUNTER — Telehealth: Payer: Self-pay | Admitting: *Deleted

## 2010-10-21 MED ORDER — DOCUSATE SODIUM 100 MG PO TABS: 100.0000 mg | ORAL_TABLET | Freq: Two times a day (BID) | ORAL | Status: AC | PRN

## 2010-10-21 NOTE — Telephone Encounter (Signed)
Sent in docusate. If that dose not effective, she may increase. FBS should be 80-120  2 hours after meals <180.  Call if blood sugar running >250 regularly

## 2010-10-21 NOTE — Telephone Encounter (Signed)
Patient is asking if she can get a rx for docusate stool softener because she can get if cheaper if she has a rx. [facility] [address]. Also she is asking when she is to call with her blood sugar readings and what is too high. Please advise.

## 2010-10-22 ENCOUNTER — Other Ambulatory Visit: Payer: Self-pay | Admitting: *Deleted

## 2010-10-22 NOTE — Telephone Encounter (Signed)
Patient advised as directed. °

## 2010-10-22 NOTE — Telephone Encounter (Signed)
Opened in error

## 2010-10-22 NOTE — Telephone Encounter (Signed)
Has this been done?

## 2010-10-23 NOTE — Assessment & Plan Note (Signed)
[facility]  NAME:  CJ, BEECHER                 ACCOUNT NO.:  [id number]  MEDICAL RECORD NO.:  [id number]      DATE OF BIRTH:  1928/09/29  PHYSICIAN:  Wayland Denis, DO       VISIT DATE:  10/22/2010                                  OFFICE VISIT   Ms. Fretwell is a 77 year old black female who is here with her family for followup on her right lower extremity traumatic ulcer.  She had Santyl this past week.  Today, she is doing really well.  There is nice improvement and granulating tissue more than even last week.  She is taking multivitamin and she is pleased with the progress.  There has been no change in her medications or social history.  PHYSICAL EXAMINATION:  On exam, she is alert, oriented, cooperative, in no acute distress, very pleasant, good historian, and well aware of her situation.  Pupils are equal.  Extraocular muscles are intact.  No cervical lymphadenopathy.  Her heart is regular.  Her breathing is unlabored.  Her wound is as described.  We will continue with Santyl waiting for the [facility] to arrive and we will apply for Oasis.  She may be a very good candidate if it improves, the Oasis underneath the VAC to help improve the way of healing.     Wayland Denis, DO     CS/MEDQ  D:  10/22/2010  T:  10/23/2010  Job:  [id number]

## 2010-10-25 ENCOUNTER — Telehealth: Payer: Self-pay | Admitting: *Deleted

## 2010-10-25 NOTE — Telephone Encounter (Signed)
Prior Angela Ali is needed for atorvastatin, form is on your desk.

## 2010-10-28 ENCOUNTER — Encounter (HOSPITAL_BASED_OUTPATIENT_CLINIC_OR_DEPARTMENT_OTHER): Payer: Medicare PPO | Attending: Plastic Surgery

## 2010-10-28 DIAGNOSIS — E119 Type 2 diabetes mellitus without complications: Secondary | ICD-10-CM | POA: Insufficient documentation

## 2010-10-28 DIAGNOSIS — I4891 Unspecified atrial fibrillation: Secondary | ICD-10-CM | POA: Insufficient documentation

## 2010-10-28 DIAGNOSIS — Z7901 Long term (current) use of anticoagulants: Secondary | ICD-10-CM | POA: Insufficient documentation

## 2010-10-28 DIAGNOSIS — J438 Other emphysema: Secondary | ICD-10-CM | POA: Insufficient documentation

## 2010-10-28 DIAGNOSIS — I10 Essential (primary) hypertension: Secondary | ICD-10-CM | POA: Insufficient documentation

## 2010-10-28 DIAGNOSIS — Z79899 Other long term (current) drug therapy: Secondary | ICD-10-CM | POA: Insufficient documentation

## 2010-10-28 DIAGNOSIS — L97809 Non-pressure chronic ulcer of other part of unspecified lower leg with unspecified severity: Secondary | ICD-10-CM | POA: Insufficient documentation

## 2010-10-30 NOTE — Telephone Encounter (Signed)
Completed form faxed to [employer]

## 2010-10-30 NOTE — Telephone Encounter (Signed)
Prior auth denied for atorvastatin.  Letter is on your desk.

## 2010-10-31 MED ORDER — ROSUVASTATIN CALCIUM 20 MG PO TABS: 20.0000 mg | ORAL_TABLET | Freq: Every day | ORAL | Status: DC

## 2010-10-31 NOTE — Telephone Encounter (Signed)
Prior auth denied for atorvastatin. Denial letter given to Dr. Ermalene Searing, pt decided to change to crestor and that was taken care of in a different phone note.

## 2010-10-31 NOTE — Telephone Encounter (Signed)
Patient advised and would like to change to crestor sent to mail order pharmacy for 90 days

## 2010-10-31 NOTE — Telephone Encounter (Signed)
Please let pt know insurance has denied atorvastatin. Let us have her try crestor if she has not tried this in past (I only see simvastatin in history)... Let me know.

## 2010-11-05 NOTE — Progress Notes (Signed)
[facility]  NAME:  Angela Ali, Angela Ali                 ACCOUNT NO.:  [id number]  MEDICAL RECORD NO.:  [id number]      DATE OF BIRTH:  [birth year]  PHYSICIAN:  Wayland Denis, DO            VISIT DATE:                                  OFFICE VISIT   Angela Ali is a 77 year old black female who is here with her son and grandson for evaluation of her right lower extremity traumatic ulcer. She has been using Santyl with really good improvement.  She has got great granulation tissue.  There is no sign of infection, very little fibrous tissue.  There has been no change in her review of systems, social history, or medications.  She is on Coumadin which is how this started in the first place.  PHYSICAL EXAMINATION:  GENERAL:  She is alert and oriented, cooperative in no acute distress. EYES:  Her pupils are equal.  Her extraocular muscles are intact. NECK:  No cervical lymphadenopathy. HEART:  Regular rate. ABDOMEN:  Soft. EXTREMITIES:  Lower extremities as described with great granulation tissue.  We applied Oasis today.  See nurse's note for size and we will continue with the Profore, elevation in her vitamins.  We are trying to get a VAC and I think that this will help improve the rate of healing as well.     Wayland Denis, DO     CS/MEDQ  D:  11/05/2010  T:  11/05/2010  Job:  [id number]

## 2010-11-12 ENCOUNTER — Ambulatory Visit (HOSPITAL_COMMUNITY)
Admission: RE | Admit: 2010-11-12 | Discharge: 2010-11-12 | Disposition: A | Discharge status: Home / Self Care | Payer: Medicare PPO | Source: Ambulatory Visit | Attending: Plastic Surgery | Admitting: Plastic Surgery

## 2010-11-12 ENCOUNTER — Ambulatory Visit (INDEPENDENT_AMBULATORY_CARE_PROVIDER_SITE_OTHER): Payer: Medicare PPO | Admitting: Family Medicine

## 2010-11-12 ENCOUNTER — Telehealth: Payer: Self-pay | Admitting: *Deleted

## 2010-11-12 ENCOUNTER — Other Ambulatory Visit (HOSPITAL_COMMUNITY): Payer: Self-pay | Admitting: Plastic Surgery

## 2010-11-12 DIAGNOSIS — Z7901 Long term (current) use of anticoagulants: Secondary | ICD-10-CM

## 2010-11-12 DIAGNOSIS — Z5181 Encounter for therapeutic drug level monitoring: Secondary | ICD-10-CM

## 2010-11-12 DIAGNOSIS — E119 Type 2 diabetes mellitus without complications: Secondary | ICD-10-CM | POA: Insufficient documentation

## 2010-11-12 DIAGNOSIS — T148XXA Other injury of unspecified body region, initial encounter: Secondary | ICD-10-CM

## 2010-11-12 DIAGNOSIS — Z01818 Encounter for other preprocedural examination: Secondary | ICD-10-CM | POA: Insufficient documentation

## 2010-11-12 DIAGNOSIS — I4891 Unspecified atrial fibrillation: Secondary | ICD-10-CM

## 2010-11-12 DIAGNOSIS — I517 Cardiomegaly: Secondary | ICD-10-CM | POA: Insufficient documentation

## 2010-11-12 LAB — POCT INR: INR: 2

## 2010-11-12 NOTE — Progress Notes (Signed)
[facility]  NAME:  Angela Ali, Angela Ali                 ACCOUNT NO.:  [id number]  MEDICAL RECORD NO.:  [id number]      DATE OF BIRTH:  1928/06/10  PHYSICIAN:  Wayland Denis, DO       VISIT DATE:  11/12/2010                                  OFFICE VISIT   HISTORY OF PRESENT ILLNESS:  Angela Ali is a 77 year old black female here with her family for reevaluation of her right leg wound.  This occurred several weeks ago when she fell.  Due to the Coumadin, she had a very large hematoma with an eschar that formed over.  This was debrided extensively.  She has been getting Santyl which improved the granulation tissue and decreased the fibrin exudate in the wound bed. We were then able to advance her to [facility], this is her second Oasis that was placed today.  She has got a really good granulation tissue.  No sign of infection.  Mild edema.  She is also getting the Profore Lite which is helping with decreasing the edema.  Due to her diabetes and the size of this, it is imperative that we get this healed as soon as possible.  MEDICATIONS:  There has been no change in her medications.  REVIEW OF SYSTEMS:  Negative.  No change in medical condition.  PHYSICAL EXAMINATION:  GENERAL:  She is alert, oriented, cooperative, and in no acute distress.  She is very pleasant for the exam. HEENT:  Pupils are equal and reactive.  Extraocular muscles are intact. NECK:  No cervical lymphadenopathy. LUNGS:  Breathing is unlabored. HEART:  Irregular but that is what the Coumadin is for. EXTREMITIES;  Lower extremity:  She has good capillary refill.  It does not appear to be infected at present.  There is some swelling, particularly superiorly over the area that has the undermining granulation tissue noted in the wound bed.  Overall, it is pink and clean, little bit of debriding was done and another Oasis was placed.  ASSESSMENT:  Right lower extremity traumatic injury.  PLAN:  The patient  needs VAC to help this get healed as quickly as possible.  Due to her diabetes, she is at risk for the nonhealing wound. We would like to do everything we can and to get this healed as quick as possible.  If you have any questions, please feel free to give a fall.     Wayland Denis, DO    CS/MEDQ  D:  11/12/2010  T:  11/12/2010  Job:  [id number]

## 2010-11-12 NOTE — Telephone Encounter (Signed)
Patient says that she has been having issues with her allergies and is asking what you think the best thing for her to take would be since she is on so many medications. She is asking if you think allegra would be okay.

## 2010-11-12 NOTE — Patient Instructions (Signed)
Continue current dose, check in 4 weeks  

## 2010-11-13 NOTE — Telephone Encounter (Signed)
Angela Ali would be a fine option with the rest of her meds.  Please add to med list.

## 2010-11-13 NOTE — Telephone Encounter (Signed)
This has been done per [employer].

## 2010-11-14 ENCOUNTER — Ambulatory Visit: Payer: Medicare PPO

## 2010-11-19 NOTE — Progress Notes (Signed)
[facility]  NAME:  Angela Ali, Angela Ali                      ACCOUNT NO.:  MEDICAL RECORD NO.:  [id number]      DATE OF BIRTH:  1928/08/12  PHYSICIAN:  Wayland Denis, DO            VISIT DATE:                                  OFFICE VISIT   Angela Ali is a 77 year old female here with her son and grandson for review of her right leg ulcer.  This occurred due to trauma.  We have been using Oasis on the wound with very good improvement.  There is some epithelialization at the edges, less fibrous tissue, more granulation tissue, less swelling.  She has one additional superficial wound where the skin likely had a blister on the medial aspect and opened.  There has been no change in her medications or social history.  She has some forms with her to fill out for trying to obtain the [facility].  PHYSICAL EXAMINATION:  GENERAL:  She is alert, oriented, and cooperative, in no acute distress.  She is pleasant and a good historian. HEENT:  Her pupils are equal.  Extraocular muscles are intact. NECK:  No cervical lymphadenopathy. LUNGS:  Clear. EXTREMITIES:  Pulses are strong in the upper extremities, a bit weaker in the leg but no change from previously. SKIN:  There is very minimal drainage.  No sign of infection.  We will continue with the Oasis and try and obtain the [facility] to help with healing.  We will see her in a week.     Wayland Denis, DO     CS/MEDQ  D:  11/19/2010  T:  11/19/2010  Job:  [id number]

## 2010-11-26 ENCOUNTER — Encounter (HOSPITAL_BASED_OUTPATIENT_CLINIC_OR_DEPARTMENT_OTHER): Payer: Medicare PPO | Attending: Plastic Surgery

## 2010-11-26 DIAGNOSIS — L97809 Non-pressure chronic ulcer of other part of unspecified lower leg with unspecified severity: Secondary | ICD-10-CM | POA: Insufficient documentation

## 2010-11-26 DIAGNOSIS — E1169 Type 2 diabetes mellitus with other specified complication: Secondary | ICD-10-CM | POA: Insufficient documentation

## 2010-11-26 NOTE — Progress Notes (Signed)
[facility]  NAME:  Angela Ali, Angela Ali                 ACCOUNT NO.:  [id number]  MEDICAL RECORD NO.:  [id number]      DATE OF BIRTH:  1928/07/09  PHYSICIAN:  Wayland Denis, DO       VISIT DATE:  11/26/2010                                  OFFICE VISIT   [name] is a 77 year old female who is here in followup, who has been using Oasis with some improvement.  She has right lower extremity traumatic injury.  Overall is granulating extremely well.  There is no sign of infection.  She has small amount of fibrous tissue on the superior aspect.  The rest of it is completely granulated, and she has some epithelialization occurring laterally.  We are still waiting for approval for the [facility].  There seems to be just a little bit of central hypergranulation, so we are going to wait on the Oasis.  PHYSICAL EXAMINATION:  HEENT:  Her pupils are equal.  Extraocular muscles intact. NECK:  No cervical lymphadenopathy. LUNGS:  Clear. HEART:  Irregular, but she has AFib, but the rate is normal. EXTREMITIES:  Lower extremities as described.  We will do Pravachol today with an Unna boot, elevation, and may go back to the Oasis next week.  I am waiting to hear about the VAC.     Wayland Denis, DO     CS/MEDQ  D:  11/26/2010  T:  11/26/2010  Job:  [id number]

## 2010-11-28 ENCOUNTER — Ambulatory Visit (INDEPENDENT_AMBULATORY_CARE_PROVIDER_SITE_OTHER): Payer: Medicare PPO | Admitting: Family Medicine

## 2010-11-28 ENCOUNTER — Other Ambulatory Visit: Payer: Medicare PPO

## 2010-11-28 DIAGNOSIS — R7309 Other abnormal glucose: Secondary | ICD-10-CM

## 2010-11-28 DIAGNOSIS — E538 Deficiency of other specified B group vitamins: Secondary | ICD-10-CM

## 2010-11-28 DIAGNOSIS — Z5181 Encounter for therapeutic drug level monitoring: Secondary | ICD-10-CM

## 2010-11-28 DIAGNOSIS — Z7901 Long term (current) use of anticoagulants: Secondary | ICD-10-CM

## 2010-11-28 DIAGNOSIS — I4891 Unspecified atrial fibrillation: Secondary | ICD-10-CM

## 2010-11-28 MED ORDER — CYANOCOBALAMIN 1000 MCG/ML IJ SOLN
1000.0000 ug | Freq: Once | INTRAMUSCULAR | Status: AC
  Administered 2010-11-28: 1000 ug via INTRAMUSCULAR

## 2010-11-28 NOTE — Patient Instructions (Signed)
2.5 mg daily, except 5 mg MWF, recheck 4 weeks

## 2010-12-01 ENCOUNTER — Telehealth: Payer: Self-pay | Admitting: *Deleted

## 2010-12-01 NOTE — Telephone Encounter (Signed)
Pt just wanted to verify future appt times

## 2010-12-02 NOTE — Progress Notes (Signed)
  Subjective:    Patient ID: Angela Ali, female    DOB: [birth year], 77 y.o.   MRN: [id number]  HPI    Review of Systems     Objective:   Physical Exam        Assessment & Plan:  Given

## 2010-12-03 NOTE — Progress Notes (Signed)
[facility]  NAME:  VERTA, RIEDLINGER                 ACCOUNT NO.:  [id number]  MEDICAL RECORD NO.:  [id number]      DATE OF BIRTH:  April 16, 1929  PHYSICIAN:  Wayland Denis, DO       VISIT DATE:  12/03/2010                                  OFFICE VISIT   Ms. Cataldi is here for follow-up on her right leg ulcer that she obtained when she fell.  She has been doing very well with Oasis, and granulating and epithelializing.  There has been no change in her medical or social history.  PHYSICAL EXAMINATION:  GENERAL:  She is alert, oriented, cooperative, in no acute distress.  She is very pleasant. HEENT:  Her pupils are equal.  Extraocular muscles are intact.  No cervical lymphadenopathy. LUNGS:  Breathing is unlabored. HEART:  Regular.  Wound is as described.  Oasis was applied with manufacturer's guidelines, secured with Steri-Strips, and all of that was used.  We will see her back in 1 week and Profore Lite was placed over it.     Wayland Denis, DO     CS/MEDQ  D:  12/03/2010  T:  12/03/2010  Job:  [id number]

## 2010-12-05 ENCOUNTER — Ambulatory Visit: Payer: Medicare PPO | Admitting: Family Medicine

## 2010-12-09 ENCOUNTER — Ambulatory Visit: Payer: Medicare PPO | Admitting: Family Medicine

## 2010-12-10 ENCOUNTER — Ambulatory Visit: Payer: Medicare PPO

## 2010-12-10 NOTE — Progress Notes (Signed)
[facility]  NAME:  [name], Angela Ali                 ACCOUNT NO.:  [id number]  MEDICAL RECORD NO.:  [id number]      DATE OF BIRTH:  [birth year]  PHYSICIAN:  Wayland Denis, DO       VISIT DATE:  12/10/2010                                  OFFICE VISIT   [name] is a 77 year old black female, who is here with her daughter for followup on her right lower extremity traumatic injury.  She is doing extremely well and healing with granulation that is very healthy in the center, no fibrous tissue, and she is epithelializing at the edges.  We have been using Oasis with great improvement.  There has been no change in her medication.  Her blood sugars are stable and she is maintaining adequate level.  On exam, she is alert, oriented, cooperative, in no acute distress.  Her pupils are equal.  Her extraocular muscles are intact.  Her breathing is unlabored.  Heart is irregular with AFib.  Pulses are equal.  The wound is as described in the nurse's note and noted.  We will continue with Oasis which was applied according to the manufacturer guidelines, unna boot, Silvercel, Kerlix, and we will see her back in 1 week.     Wayland Denis, DO     CS/MEDQ  D:  12/10/2010  T:  12/10/2010  Job:  [id number]

## 2010-12-11 ENCOUNTER — Telehealth: Payer: Self-pay | Admitting: *Deleted

## 2010-12-11 NOTE — Telephone Encounter (Signed)
Glucerna or other low sugar supplement

## 2010-12-11 NOTE — Telephone Encounter (Signed)
Patient says that she was told to start drinking protein drinks and she is asking which ones you think would be best for her since she is a diabetic.

## 2010-12-12 NOTE — Telephone Encounter (Signed)
Patient advised.

## 2010-12-17 NOTE — Progress Notes (Signed)
[facility]  NAME:  Angela Ali, Angela Ali                 ACCOUNT NO.:  [id number]  MEDICAL RECORD NO.:  [id number]      DATE OF BIRTH:  1928/06/25  PHYSICIAN:  Wayland Denis, DO       VISIT DATE:  12/17/2010                                  OFFICE VISIT   Ms. Hoggard is here with her family.  She is a 77 year old black female here for followup on her right lower extremity diabetic foot ulcer.  She originally got it from a trauma.  She has been using Oasis and has really good improvement.  It has granulated well.  There is no fibrous tissue and it is epithelializing from the thigh.  We are also doing the Profore Lite.  She has been regulating her blood sugars well.  There has been no change in her medications, social history, and review of systems, which is negative if not otherwise stated.  On exam, she is alert and oriented, cooperative in no acute distress. Her pupils are equal.  Her extraocular muscles are intact.  No cervical lymphadenopathy.  Her breathing is unlabored.  Her heart rate is regular.  Wound is as described with the signs as noted in the nurse's note.  We will continue with Oasis and Profore and see her back in a week.     Wayland Denis, DO     CS/MEDQ  D:  12/17/2010  T:  12/17/2010  Job:  [id number]

## 2010-12-19 ENCOUNTER — Ambulatory Visit: Payer: Medicare PPO | Admitting: Family Medicine

## 2010-12-24 ENCOUNTER — Other Ambulatory Visit: Payer: Self-pay | Admitting: Plastic Surgery

## 2010-12-24 NOTE — Progress Notes (Signed)
[facility]  NAME:  Angela Ali, Angela Ali                 ACCOUNT NO.:  [id number]  MEDICAL RECORD NO.:  [id number]      DATE OF BIRTH:  [birth year]  PHYSICIAN:  Wayland Denis, DO       VISIT DATE:  12/24/2010                                  OFFICE VISIT   Angela Ali is a 77 year old black female here with her family for followup on her right lower extremity diabetic foot ulcer originally started from trauma.  She has been doing extremely well.  She is epithelializing on the edges and healing up.  We were using Oasis over the past week with [employer].  She is very pleased with the results. There has been no change in her medications.  Her blood sugars are controlled.  No change in her social history.  She may have to change the timing a little bit for her visit as 2 of her grand kids are going to school, so will be available to help her with her ride, but she still going to come weekly.  On exam, she is alert and oriented, cooperative, no acute distress, very pleasant.  Pupils are equal.  Extraocular muscles are intact.  Heart rate is within normal limits.  Breathing is unlabored.  Wound is as described.  Oasis was placed and folded for layering and Unna boot applied.  She tolerated it well.  There were no complications.  We will see her back in a week.     Wayland Denis, DO     CS/MEDQ  D:  12/24/2010  T:  12/24/2010  Job:  [id number]

## 2010-12-26 ENCOUNTER — Ambulatory Visit (INDEPENDENT_AMBULATORY_CARE_PROVIDER_SITE_OTHER): Payer: Medicare PPO | Admitting: Family Medicine

## 2010-12-26 DIAGNOSIS — Z5181 Encounter for therapeutic drug level monitoring: Secondary | ICD-10-CM

## 2010-12-26 DIAGNOSIS — I4891 Unspecified atrial fibrillation: Secondary | ICD-10-CM

## 2010-12-26 DIAGNOSIS — Z7901 Long term (current) use of anticoagulants: Secondary | ICD-10-CM

## 2010-12-26 NOTE — Patient Instructions (Signed)
Continue current dose, check in 4 weeks  

## 2010-12-31 ENCOUNTER — Encounter (HOSPITAL_BASED_OUTPATIENT_CLINIC_OR_DEPARTMENT_OTHER): Payer: Medicare PPO | Attending: Plastic Surgery

## 2010-12-31 DIAGNOSIS — L97809 Non-pressure chronic ulcer of other part of unspecified lower leg with unspecified severity: Secondary | ICD-10-CM | POA: Insufficient documentation

## 2010-12-31 DIAGNOSIS — E1169 Type 2 diabetes mellitus with other specified complication: Secondary | ICD-10-CM | POA: Insufficient documentation

## 2010-12-31 NOTE — Progress Notes (Signed)
[facility]  NAME:  Angela Ali, Angela Ali                 ACCOUNT NO.:  [id number]  MEDICAL RECORD NO.:  [id number]      DATE OF BIRTH:  05/02/1928  PHYSICIAN:  Wayland Denis, DO       VISIT DATE:  12/31/2010                                  OFFICE VISIT   HISTORY OF PRESENT ILLNESS:  Angela Ali is a 77 year old black female who is here with her family for followup on her right lower extremity diabetic leg ulcer caused by initially trauma who has been  using Oasis with great improvement on the area.  She is granulating and epithelializing with Unna boots dressing.  SOCIAL HISTORY:  Is essentially unchanged but more family is back at school so makes it a little more difficult for her to get here but she is still doing well.  MEDICATIONS:  No change in medications.  PHYSICAL EXAMINATION:  GENERAL:  She is alert, oriented, cooperative, and in no acute distress. RESPIRATIONS:  Breathing is unlabored. HEART:  Irregular but rate is regular. ABDOMEN:  Soft, large but nontender.  WOUND CARE:  Oasis was placed, folded over, hydrated and activated with the normal saline and the dressing of Unna boots, Kerlix, and Coban reapplied.  We will see her back in 1 week.  This is #5 on the Oasis.     Wayland Denis, DO     CS/MEDQ  D:  12/31/2010  T:  12/31/2010  Job:  [id number]

## 2011-01-07 ENCOUNTER — Telehealth: Payer: Self-pay | Admitting: *Deleted

## 2011-01-07 ENCOUNTER — Telehealth: Payer: Self-pay | Admitting: Cardiovascular Disease

## 2011-01-07 NOTE — Telephone Encounter (Signed)
Patient says her BP is still elevated, consistently around 189/112.  She is asking if she needs to see someone here or her Cardiologist.

## 2011-01-07 NOTE — Telephone Encounter (Signed)
Patient advised to call dr Mariah Milling office after looking back at office notes it looks like he was changing her bp med and he wanted her to call with numbers. Patient advised to contact us if she can not get in to see cardiology

## 2011-01-07 NOTE — Telephone Encounter (Signed)
Pt states that her BP has been running high and wants to know if she needs to change her meds or come in and be seen. 180's over 100's

## 2011-01-07 NOTE — Progress Notes (Signed)
[facility]  NAME:  Angela Ali, Angela Ali                 ACCOUNT NO.:  [id number]  MEDICAL RECORD NO.:  [id number]      DATE OF BIRTH:  May 28, 1928  PHYSICIAN:  Wayland Denis, DO       VISIT DATE:  01/07/2011                                  OFFICE VISIT   Ms. Schlauch is here with her family for followup on her right lower extremity ulcer.  The wound is healing very well and epithelializing from the size, granulating at the center, it is definitely smaller healing.  There is no sign of infection.  The surrounding tissue is improving in its color, decreasing in the swelling.  Her diabetes is well controlled.  She mentioned her blood pressure being elevated and may have plans to go to the doctor tomorrow to have that looked at.  There has been no change in her medications or social history.  On exam, she is alert and oriented, cooperative in no acute distress, very pleasant.  Breathing is unlabored.  Her upper extremity pulses are equal.  The wound is as described.  There is no abdominal tenderness.  Today, we will do collagen and Unna boot and elevation and likely next week continue with the Oasis.     Wayland Denis, DO     CS/MEDQ  D:  01/07/2011  T:  01/07/2011  Job:  [id number]

## 2011-01-08 NOTE — Telephone Encounter (Signed)
Did she every try isosorbide with the clonidine? Is she able to tolerate even a low dose of hydralazine (25?)

## 2011-01-08 NOTE — Telephone Encounter (Signed)
Agreed -

## 2011-01-08 NOTE — Telephone Encounter (Signed)
Spoke to pt, she reports BP 184/112 and states this is pretty consistent. She does go to wound center for legs, edema, and she denies any pain resulting in high BP. She was last seen 09/2010 and since then we have been back and forth with clonidine and hydralazine; she is now back to taking clonidine 0.2mg  and off hydralazine. She says BP has really been pretty high since that change. (pt also on toprol xl 200mg  qd, lisinopril 40mg  qd, digoxin , lasix 80mg  qd). States HR stable at 60s-70s, never >90 and regular, (h/o a fib). I have advised pt document BPs daily so we can see trend, and in the meantime will send to Dr. Mariah Milling. Do we need to see in office, please advise.

## 2011-01-09 NOTE — Telephone Encounter (Signed)
Pt has never tried Isosorbide, and she never took 1/2 dose of Hydralazine, she only took a couple of doses before realizing it caused severe HA. Pt does still have hydralazine 50mg  at home, please advise. Thanks.

## 2011-01-13 NOTE — Telephone Encounter (Signed)
woul try 1/2 dose hydralazine first BID then up to TID

## 2011-01-14 ENCOUNTER — Other Ambulatory Visit: Payer: Self-pay | Admitting: *Deleted

## 2011-01-14 MED ORDER — FLUTICASONE PROPIONATE 50 MCG/ACT NA SUSP: 2.0000 | Freq: Every day | NASAL | Status: DC

## 2011-01-14 NOTE — Progress Notes (Signed)
[facility]  NAME:  Angela Ali, Angela Ali                 ACCOUNT NO.:  [id number]  MEDICAL RECORD NO.:  [id number]      DATE OF BIRTH:  05/31/1928  PHYSICIAN:  Wayland Denis, DO       VISIT DATE:  01/14/2011                                  OFFICE VISIT   HISTORY OF PRESENT ILLNESS:  Angela Ali is a 77 year old black female who is here for a followup on her right lower extremity diabetic ulcer that occurred from originally trauma.  She has been using collagen with an [employer] this past week.  She has had Oasis placed in the past.  It is healing extremely well.  There is a little bit of tunneling at the superior border and a little of swelling at the inferior medial aspect. Both these areas were cleaned out and looks like a little fat necrosis that was cleaned out and a little silver nitrate was used to help with hypergranulation as well as on the central portion.  The lateral aspect of the wound circumferentially is epithelializing with very good improvement.  She is having a little bit of intermittent pain.  It is not more severe or worse and sounds like a part of the healing.  There has been no change in her medications.  Her blood sugars have been stable.  SOCIAL HISTORY:  Unchanged.  REVIEW OF SYSTEMS:  Negative.  PHYSICAL EXAMINATION:  She is alert and oriented, cooperative, not in any distress.  ASSESSMENT AND PLAN:  Wound is as described.  Oasis was placed.  Note, Retin-A was activated with normal saline and the [employer] for her dressing and we will see her back in 1 week.     Wayland Denis, DO     CS/MEDQ  D:  01/14/2011  T:  01/14/2011  Job:  [id number]

## 2011-01-14 NOTE — Telephone Encounter (Signed)
*  While system was down yesterday, pt called with more BP readings* 01/13/11 0900- BP daily starting 9/13: 152/109 hr 71, 125/78 hr 61, 144/92 hr 71, 126/80 hr 51, 173/117 hr 47 (today), I had pt re-take- 178/117 hr 67.  01/14/11 0915- called pt to advise below regarding adding hydralazine. NO answer, lmom tcb.

## 2011-01-15 ENCOUNTER — Telehealth: Payer: Self-pay | Admitting: Cardiovascular Disease

## 2011-01-15 MED ORDER — HYDRALAZINE HCL 50 MG PO TABS: 25.0000 mg | ORAL_TABLET | Freq: Three times a day (TID) | ORAL | Status: DC

## 2011-01-15 NOTE — Telephone Encounter (Signed)
Would take both clonidine and hydralazine

## 2011-01-15 NOTE — Telephone Encounter (Signed)
Attempted to contact pt, LMOM TCB.  

## 2011-01-15 NOTE — Telephone Encounter (Signed)
Pt will start Hydralazine 25mg  bid for few days, and as long as no s/e or low bp, incr to TID. She still has 50mg  tabs at home and will cut in half. Pt will call mon with bp results.

## 2011-01-15 NOTE — Telephone Encounter (Signed)
Patient called back regarding previous phone conversation and wanted to verify that she is to take the hydrolazine with the clonidine or should she stop  The clonidine?

## 2011-01-15 NOTE — Telephone Encounter (Signed)
Pt notified to take both clonidine and hydralazine.

## 2011-01-20 ENCOUNTER — Telehealth: Payer: Self-pay | Admitting: *Deleted

## 2011-01-20 MED ORDER — ROSUVASTATIN CALCIUM 20 MG PO TABS: 20.0000 mg | ORAL_TABLET | Freq: Every day | ORAL | Status: DC

## 2011-01-20 NOTE — Telephone Encounter (Signed)
Returned patient's call and left message for her to return my call.

## 2011-01-20 NOTE — Telephone Encounter (Signed)
[facility] says [facility] needs new rx for crestor sent in. This was [facility] to [facility]

## 2011-01-20 NOTE — Telephone Encounter (Signed)
Patient called and requested that the office send a refill to [facility] for her Lipitor.  Medication list shows that patient is now taking Crestor and a script was sent to the pharmacy in July. Patient does not remember the conversation about switching to Crestor.  She is going to check with her pharmacy and see why she never got the Crestor. Patient requested that [facility] call her back regarding this.

## 2011-01-21 NOTE — Progress Notes (Signed)
[facility]  NAME:  Angela Ali, KENG                 ACCOUNT NO.:  [id number]  MEDICAL RECORD NO.:  [id number]      DATE OF BIRTH:  1928/07/03  PHYSICIAN:  Wayland Denis, DO       VISIT DATE:  01/21/2011                                  OFFICE VISIT   HISTORY OF PRESENT ILLNESS:  Angela Ali is a 77 year old female here with the family for followup on a right lower extremity ulcer.  She is doing extremely well and has had significant improvement in the healing and epithelialization.  We did Oasis last week which made a huge difference with the [employer]  There has been no change in her medications or social history.  PHYSICAL EXAMINATION:  GENERAL:  She is alert, oriented, cooperative, not any acute distress.  She is very pleasant. HEENT:  Pupils are equal.  Extraocular muscles are intact. CHEST:  Breathing is unlabored. HEART:  Rate is regular. SKIN:  The wound is as described.  ASSESSMENT:  Right lower extremity ulcer.  PLAN:  We placed Oasis, we used all of it, activated it with normal saline.  I rolled one portion of it and put it under the superior border where there was about a centimeter of tunneling and the rest of it was placed in the center portion of the wound and we will continue with the [employer], Kerlix, Coban, and see her back in a week.     Wayland Denis, DO     CS/MEDQ  D:  01/21/2011  T:  01/21/2011  Job:  [id number]

## 2011-01-23 ENCOUNTER — Ambulatory Visit (INDEPENDENT_AMBULATORY_CARE_PROVIDER_SITE_OTHER): Payer: Medicare PPO | Admitting: Family Medicine

## 2011-01-23 DIAGNOSIS — Z5181 Encounter for therapeutic drug level monitoring: Secondary | ICD-10-CM

## 2011-01-23 DIAGNOSIS — Z7901 Long term (current) use of anticoagulants: Secondary | ICD-10-CM

## 2011-01-23 DIAGNOSIS — I4891 Unspecified atrial fibrillation: Secondary | ICD-10-CM

## 2011-01-23 LAB — POCT INR: INR: 1.5

## 2011-01-23 NOTE — Patient Instructions (Signed)
Take 5 mg today then,5 mg daily, except 2.5 mg MWF, recheck 2 weeks

## 2011-01-28 ENCOUNTER — Encounter (HOSPITAL_BASED_OUTPATIENT_CLINIC_OR_DEPARTMENT_OTHER): Payer: Medicare PPO | Attending: Plastic Surgery

## 2011-01-28 DIAGNOSIS — L97809 Non-pressure chronic ulcer of other part of unspecified lower leg with unspecified severity: Secondary | ICD-10-CM | POA: Insufficient documentation

## 2011-01-28 NOTE — Progress Notes (Signed)
[facility]  NAME:  SAPHYRE, CILLO                 ACCOUNT NO.:  [id number]  MEDICAL RECORD NO.:  [id number]      DATE OF BIRTH:  01/08/1929  PHYSICIAN:  Wayland Denis, DO       VISIT DATE:  01/28/2011                                  OFFICE VISIT   Ms. Hoffman is a 77 year old black female here with her family for reevaluation and followup from a right lower extremity ulcer.  She has been getting Oasis with marked improvement with the decrease in size as well as depths.  She has a little hypergranulation today in the most central portion, but overall doing really well.  No change in her medications or review of systems which is negative is not otherwise stated.  SOCIAL HISTORY:  Unchanged.  PHYSICAL EXAMINATION:  She is alert, oriented, and cooperative, not in any acute distress.  Pupils are equal.  Extraocular muscles are intact. Breathing is unlabored.  Heart rate is regular.  Abdomen is soft.  Wound is as described.  PLAN:  I did put a little bit of silver nitrate on the hypergranulated area.  We will do think Adaptic foam and Unna boot with Kerlix and Coban with little TCA on the periwound area.  She is to continue with a multivitamin and elevation, and follow up in 1 week.  We will see what it looks like and may or may not use another Oasis.     Wayland Denis, DO     CS/MEDQ  D:  01/28/2011  T:  01/28/2011  Job:  [id number]

## 2011-02-02 ENCOUNTER — Telehealth: Payer: Self-pay | Admitting: Cardiovascular Disease

## 2011-02-02 NOTE — Telephone Encounter (Signed)
Pt calling to give BP results. 167/107, 160/97, 143/103, 148/95, 158/95, 157/89, 150/99, 188/117, 172/98, 168/119,   Also states that she has been having some palps over last few days.

## 2011-02-04 NOTE — Telephone Encounter (Signed)
Spoke to pt, she never incr hydralazine 25 to TID, she is still taking BID, per previous phone note TG's recc. I advised she incr to TID and if BP still elevated to go to whole tablet (50mg ) TID (she was previously on this dosage). Told pt to check BP if incr to 50 TID to make sure does not get too low. Pt does have h/o a fib and is on coumadin; states her palps are occuring randomly, but denies SOB or dizziness when they occur. She thinks her HR has been staying in 70s-80s. I advised if palps continue to call me and we can do an EKG to verify HR not too high with her a fib. Pt ok with rec's given, will call next week with BP numbers.

## 2011-02-04 NOTE — Progress Notes (Signed)
[facility]  NAME:  Angela Ali, Angela Ali                      ACCOUNT NO.:  MEDICAL RECORD NO.:  [id number]      DATE OF BIRTH:  Apr 25, 1929  PHYSICIAN:  Wayland Denis, DO       VISIT DATE:  02/04/2011                                  OFFICE VISIT   Angela Ali is a 77 year old black female here with her family for followup on her right lower extremity ulcer.  She has been using TCA, zinc, Adaptic, and foam for the past week.  She actually has really good healing.  There is a little bit of tunneling in the superior aspect that she has had all along which is actually a little bit better, this is encouraging.  There has been no change in her medications or social history.  REVIEW OF SYSTEMS:  Negative, if not otherwise stated.  EXAMINATION:  She is alert, oriented, cooperative, and in no acute distress, very pleasant.  I used a little silver nitrate for some of the hypergranulation.  Her breathing is unlabored.  Her heart has a regular rate.  She has got good capillary refill in the lower extremities.  We will continue with dressing changes but will do zinc, iodoform, and Silvercel and have her follow up in 1 week for a nurse check and the second week for physician check.     Wayland Denis, DO     CS/MEDQ  D:  02/04/2011  T:  02/04/2011  Job:  [id number]

## 2011-02-06 ENCOUNTER — Ambulatory Visit: Payer: Medicare PPO

## 2011-02-11 ENCOUNTER — Ambulatory Visit (INDEPENDENT_AMBULATORY_CARE_PROVIDER_SITE_OTHER): Payer: Medicare PPO | Admitting: Family Medicine

## 2011-02-11 DIAGNOSIS — I4891 Unspecified atrial fibrillation: Secondary | ICD-10-CM

## 2011-02-11 DIAGNOSIS — Z7901 Long term (current) use of anticoagulants: Secondary | ICD-10-CM

## 2011-02-11 DIAGNOSIS — Z5181 Encounter for therapeutic drug level monitoring: Secondary | ICD-10-CM

## 2011-02-11 NOTE — Patient Instructions (Signed)
5 mg daily, except 2.5 mg M/F, recheck 1 week

## 2011-02-16 ENCOUNTER — Telehealth: Payer: Self-pay | Admitting: *Deleted

## 2011-02-16 DIAGNOSIS — E785 Hyperlipidemia, unspecified: Secondary | ICD-10-CM

## 2011-02-16 NOTE — Telephone Encounter (Signed)
Pt calling again regarding BP/HR. See phone note from 10/8. She has not kept a recent consistent record of BP, but states it has improved some but not much since taking hydralazine 25 tid instead of bid. After talking with pt, found out she was taking 8hrs apart. I told pt to try 25mg  with breakfast, lunch and dinner and to document BP's daily and call back Friday. (Previously on whole tablet 50mg , she had s/e's of severe HA). Pt is willing to re-try 50mg  if 25 tid does not improve BP enough.  Pt also c/o palpitations continuing. Saturday she had throughout day, Sunday rare, and she has not had any today. I told pt to check her HR when these occur, she has not done so, and to call me back so we can possibly do an EKG (h/o PAF). Pt ok with this, please advise with any other rec's.

## 2011-02-16 NOTE — Telephone Encounter (Signed)
Pt states she never got the crestor because it was too expensive.  She has continued to take the lipitor. She is asking if she still needs to be taking anything.  She doesn't have to pay  [facility] if the medicine is generic.  Please advise.

## 2011-02-17 ENCOUNTER — Telehealth: Payer: Self-pay | Admitting: *Deleted

## 2011-02-17 NOTE — Telephone Encounter (Signed)
Would slowly increase hydralazine, up to 1 1/2 TID, then 50 TID

## 2011-02-17 NOTE — Telephone Encounter (Signed)
I deleted crestor from med list. Please enter her liptior dose to med list.  Continue med for now.. She has lab appt scheduled for 11/8 I believe... Please  Make sure lipids are ordered at that time .

## 2011-02-17 NOTE — Telephone Encounter (Signed)
Error

## 2011-02-18 ENCOUNTER — Ambulatory Visit (INDEPENDENT_AMBULATORY_CARE_PROVIDER_SITE_OTHER): Payer: Medicare PPO | Admitting: Family Medicine

## 2011-02-18 DIAGNOSIS — Z5181 Encounter for therapeutic drug level monitoring: Secondary | ICD-10-CM

## 2011-02-18 DIAGNOSIS — Z7901 Long term (current) use of anticoagulants: Secondary | ICD-10-CM

## 2011-02-18 DIAGNOSIS — I4891 Unspecified atrial fibrillation: Secondary | ICD-10-CM

## 2011-02-18 NOTE — Patient Instructions (Signed)
5 mg daily, except 2.5 mg M, recheck 2 week

## 2011-02-18 NOTE — Telephone Encounter (Signed)
EPIC UPDATED  LIPID ADDED TO LABS

## 2011-02-18 NOTE — Telephone Encounter (Signed)
Spoke to pt, she has started to get HA again with only 1/2 tablet of hydralazine. Pt's BP today was 127/84 HR 93. She does have f/u on 10/29.

## 2011-02-18 NOTE — Telephone Encounter (Signed)
Attempted to contact pt, LMOM TCB. Pt has f/u on 10/29.

## 2011-02-20 ENCOUNTER — Telehealth: Payer: Self-pay | Admitting: *Deleted

## 2011-02-20 NOTE — Telephone Encounter (Signed)
Pt is asking if she can try pain patches for her ankle and back pain.  She would like something sent to [facility].  I will call her back on Monday if you need more information.

## 2011-02-20 NOTE — Telephone Encounter (Signed)
Overdue for Dm follow up etc.. Have her make an appt to discuss pain too.

## 2011-02-23 ENCOUNTER — Ambulatory Visit (INDEPENDENT_AMBULATORY_CARE_PROVIDER_SITE_OTHER): Payer: Medicare PPO | Admitting: Cardiovascular Disease

## 2011-02-23 ENCOUNTER — Encounter: Payer: Self-pay | Admitting: Cardiovascular Disease

## 2011-02-23 VITALS — BP 160/90 | HR 73 | Ht 64.0 in | Wt 189.5 lb

## 2011-02-23 DIAGNOSIS — E663 Overweight: Secondary | ICD-10-CM

## 2011-02-23 DIAGNOSIS — E785 Hyperlipidemia, unspecified: Secondary | ICD-10-CM

## 2011-02-23 DIAGNOSIS — I4891 Unspecified atrial fibrillation: Secondary | ICD-10-CM

## 2011-02-23 DIAGNOSIS — I10 Essential (primary) hypertension: Secondary | ICD-10-CM

## 2011-02-23 MED ORDER — DILTIAZEM HCL 30 MG PO TABS: 30.0000 mg | ORAL_TABLET | Freq: Four times a day (QID) | ORAL | Status: DC | PRN

## 2011-02-23 MED ORDER — CLONIDINE HCL 0.3 MG PO TABS: 0.3000 mg | ORAL_TABLET | Freq: Two times a day (BID) | ORAL | Status: DC

## 2011-02-23 NOTE — Assessment & Plan Note (Signed)
Blood pressure is high. We will increase her clonidine to 0.3 mg b.i.d.. She has followup with Dr. Ermalene Searing in 2 weeks' time.

## 2011-02-23 NOTE — Assessment & Plan Note (Signed)
We have encouraged continued exercise, careful diet management in an effort to lose weight. 

## 2011-02-23 NOTE — Progress Notes (Signed)
Patient ID: Angela Ali, female    DOB: 04/26/1929, 77 y.o.   MRN: [id number]  HPI Comments: Angela Ali is a very pleasant 77 year old woman with a history of chronic atrial fibrillation, on warfarin, hypertension, chronic lower extremity edema, shortness of breath, who presents for routine followup.  Angela Ali is still recovering from her right lower extremity hematoma. She developed a wound over the right lateral aspect of her leg and has been seen in the wound clinic on a weekly basis. She was taken off the diltiazem because of lower extremity edema ( long acting dose)  She was unable to tolerate hydralazine because of headache. She does have occasional episodes of heart racing despite being on metoprolol  No episodes of chest pain, lightheadedness or dizziness.    EKG shows atrial fibrillation with ventricular rate of 70 beats per minute, no significant ST or T wave changes    Outpatient Encounter Prescriptions as of 02/23/2011  Medication Sig Dispense Refill  . albuterol (VENTOLIN HFA) 108 (90 BASE) MCG/ACT inhaler Inhale 2 puffs into the lungs every 4 (four) hours as needed. For cough and wheeze       . alendronate (FOSAMAX) 70 MG tablet Take 70 mg by mouth every 7 (seven) days. Take with a full glass of water on an empty stomach.       Marland Kitchen atorvastatin (LIPITOR) 80 MG tablet Take 80 mg by mouth daily.        . benzonatate (TESSALON PERLES) 100 MG capsule Take 100 mg by mouth 3 (three) times daily as needed. Cough        . cloNIDine (CATAPRES) 0.3 MG tablet Take 1 tablet (0.3 mg total) by mouth 2 (two) times daily.  180 tablet  3  . digoxin (LANOXIN) 0.125 MG tablet Take 1 tablet (125 mcg total) by mouth daily.  90 tablet  3  . doxycycline (VIBRAMYCIN) 100 MG capsule Take 100 mg by mouth 2 (two) times daily.        Marland Kitchen esomeprazole (NEXIUM) 40 MG capsule Take 40 mg by mouth daily.        . fexofenadine (ALLEGRA) 60 MG tablet Take 60 mg by mouth 2 (two) times daily. Ass needed for  allergies       . fluorometholone (FML) 0.1 % ophthalmic suspension 1 drop. As needed       . fluticasone (FLONASE) 50 MCG/ACT nasal spray Place 2 sprays into the nose daily.  48 g  2  . Fluticasone-Salmeterol (ADVAIR DISKUS) 250-50 MCG/DOSE AEPB Inhale 1 puff into the lungs every 12 (twelve) hours. Rinse after use       . furosemide (LASIX) 80 MG tablet Take 1 tablet (80 mg total) by mouth daily.  90 tablet  3  . HYDROcodone-homatropine (HYCODAN) 5-1.5 MG/5ML syrup Take by mouth at bedtime as needed.        Marland Kitchen lisinopril (PRINIVIL,ZESTRIL) 40 MG tablet Take 1 tablet (40 mg total) by mouth daily.  90 tablet  3  . metoprolol (TOPROL-XL) 200 MG 24 hr tablet TAKE 1 TABLET BY MOUTH EVERY DAY  30 tablet  4  . omeprazole (PRILOSEC) 20 MG capsule Take 1 capsule (20 mg total) by mouth daily.  90 capsule  3  . warfarin (COUMADIN) 5 MG tablet Take one tablet by mouth once a day as directed  90 tablet  3    Review of Systems  Constitutional: Negative.   HENT: Negative.   Eyes: Negative.   Respiratory: Negative.  Cardiovascular: Positive for palpitations.  Gastrointestinal: Negative.   Musculoskeletal: Negative.        Leg wound on the right lower extremity  Skin: Negative.   Neurological: Negative.   Hematological: Negative.   Psychiatric/Behavioral: Negative.   All other systems reviewed and are negative.    BP 160/90  Pulse 73  Ht 5\' 4"  (1.626 m)  Wt 189 lb 8 oz (85.957 kg)  BMI 32.53 kg/m2  Physical Exam  Nursing note and vitals reviewed. Constitutional: She is oriented to person, place, and time. She appears well-developed and well-nourished.       Obese   HENT:  Head: Normocephalic.  Nose: Nose normal.  Mouth/Throat: Oropharynx is clear and moist.  Eyes: Conjunctivae are normal. Pupils are equal, round, and reactive to light.  Neck: Normal range of motion. Neck supple. No JVD present.  Cardiovascular: Normal rate, regular rhythm, S1 normal, S2 normal, normal heart sounds and  intact distal pulses.  Exam reveals no gallop and no friction rub.   No murmur heard. Pulmonary/Chest: Effort normal and breath sounds normal. No respiratory distress. She has no wheezes. She has no rales. She exhibits no tenderness.  Abdominal: Soft. Bowel sounds are normal. She exhibits no distension. There is no tenderness.  Musculoskeletal: Normal range of motion. She exhibits no edema and no tenderness.       Right lower extremity is in a wrap  Lymphadenopathy:    She has no cervical adenopathy.  Neurological: She is alert and oriented to person, place, and time. Coordination normal.  Skin: Skin is warm and dry. No rash noted. No erythema.  Psychiatric: She has a normal mood and affect. Her behavior is normal. Judgment and thought content normal.         Assessment and Plan

## 2011-02-23 NOTE — Patient Instructions (Addendum)
You are doing well. Increase clonidine to 0.2 mg three times a day When you run out, start 0.3 mg twice a day (new script)  Take diltiazem 30mg  as needed for fast rhythm   Ok to take an additonal dose if needed  Please call us if you have new issues that need to be addressed before your next appt.  Your physician recommends that you schedule a follow-up appointment in: 6 months

## 2011-02-23 NOTE — Assessment & Plan Note (Signed)
Heart rate remains relatively well-controlled. No further medication changes were made

## 2011-02-23 NOTE — Assessment & Plan Note (Signed)
Cholesterol is at goal on the current lipid regimen. No changes to the medications were made.  

## 2011-02-23 NOTE — Telephone Encounter (Signed)
Patient advised and has appt on November on 15

## 2011-02-26 ENCOUNTER — Telehealth: Payer: Self-pay

## 2011-02-26 NOTE — Telephone Encounter (Signed)
Pt had left arm pain for 1 week. Pain worsened last night and all day today. Pt is at [facility] and wants to be seen. No chest pain and cannot tell me what part of arm hurts now. Pain level now is 9. Dr Ermalene Searing suggested appt in AM with Dr Ermalene Searing but if cannot wait until AM can go to [facility] tonight. Pt does not want to go to urgent care and made appt 02/27/11 at 9:45am. Pt said would try heating pad tonight. I reminded pt to monitor how long she would leave heating pad on to avoid possible burn. Pt said OK and will see Dr Ermalene Searing in AM.

## 2011-02-27 ENCOUNTER — Ambulatory Visit (INDEPENDENT_AMBULATORY_CARE_PROVIDER_SITE_OTHER)
Admission: RE | Admit: 2011-02-27 | Discharge: 2011-02-27 | Disposition: A | Discharge status: Home / Self Care | Payer: Medicare PPO | Source: Ambulatory Visit | Attending: Family Medicine | Admitting: Family Medicine

## 2011-02-27 ENCOUNTER — Ambulatory Visit (INDEPENDENT_AMBULATORY_CARE_PROVIDER_SITE_OTHER): Payer: Medicare PPO | Admitting: Family Medicine

## 2011-02-27 ENCOUNTER — Encounter: Payer: Self-pay | Admitting: Family Medicine

## 2011-02-27 VITALS — BP 130/80 | HR 63 | Temp 98.2°F | Ht 64.0 in | Wt 190.4 lb

## 2011-02-27 DIAGNOSIS — M67919 Unspecified disorder of synovium and tendon, unspecified shoulder: Secondary | ICD-10-CM

## 2011-02-27 DIAGNOSIS — E538 Deficiency of other specified B group vitamins: Secondary | ICD-10-CM

## 2011-02-27 DIAGNOSIS — M79609 Pain in unspecified limb: Secondary | ICD-10-CM

## 2011-02-27 DIAGNOSIS — M79602 Pain in left arm: Secondary | ICD-10-CM | POA: Insufficient documentation

## 2011-02-27 DIAGNOSIS — M7580 Other shoulder lesions, unspecified shoulder: Secondary | ICD-10-CM | POA: Insufficient documentation

## 2011-02-27 DIAGNOSIS — M719 Bursopathy, unspecified: Secondary | ICD-10-CM

## 2011-02-27 DIAGNOSIS — Z23 Encounter for immunization: Secondary | ICD-10-CM

## 2011-02-27 MED ORDER — TRAMADOL HCL 50 MG PO TABS: 50.0000 mg | ORAL_TABLET | Freq: Four times a day (QID) | ORAL | Status: AC | PRN

## 2011-02-27 MED ORDER — TRAMADOL HCL 50 MG PO TABS: 50.0000 mg | ORAL_TABLET | Freq: Four times a day (QID) | ORAL | Status: DC | PRN

## 2011-02-27 MED ORDER — OXYCODONE-ACETAMINOPHEN 5-325 MG PO TABS: 1.0000 | ORAL_TABLET | Freq: Three times a day (TID) | ORAL | Status: DC | PRN

## 2011-02-27 NOTE — Assessment & Plan Note (Addendum)
Start gentle stretching exercises per information packet. Use tramadol for pain relief. Has not tolerated percocet in past. Antiinflammatories contraindicated given on coumadin. Will eval with X-ray.   Given cannot take antinflammatories.Marland Kitchen i will have her follow up with Dr. Salena Saner for possible steroid injection in 1-2 weeks if not improving.

## 2011-02-27 NOTE — Patient Instructions (Signed)
Start gentle stretching exercises per information packet. Use percocet for pain relief.  Antiinflammatories contraindicated in you given you are on coumadin. We will call with X-ray results.

## 2011-02-27 NOTE — Progress Notes (Signed)
  Subjective:    Patient ID: Angela Ali, female    DOB: 05-20-1928, 77 y.o.   MRN: [id number]  HPI  77 year old female with history of arthritis, chronic low back pain,  DM, CHF and cardiomyopathy, afib, and  asthma presents with 1 week history of gradually worsening pain in left upper arm/shoulder, some in upper corner of chest wall, radiates down to wrist. Pain worse with internal rotation of shoulder. No numbness or tingling. Feels weak in left hand because of pain.   No known injury. No recent falls.   No dizziness, no chest pain with walking or exerting herself.   Using tylenol arthritis for pain. Helps minimally.  Had EKG done at [facility], 4 days ago... Nml at that time.  BP was high at that time.   Right leg hematoma.. Slow healing.. Started on antibiotic (doxycycline at wound care center) to make sure infection is not what is slowing healing.    Review of Systems  Constitutional: Negative for fever and fatigue.  HENT: Negative for ear pain.   Eyes: Negative for pain.  Respiratory: Negative for chest tightness and shortness of breath.   Cardiovascular: Negative for chest pain, palpitations and leg swelling.  Gastrointestinal: Negative for abdominal pain.  Genitourinary: Negative for dysuria.       Objective:   Physical Exam  Constitutional: She appears well-developed and well-nourished.       overweight  Neck: Normal range of motion. Neck supple.  Cardiovascular: Normal rate, normal heart sounds, intact distal pulses and normal pulses.  An irregular rhythm present.  No murmur heard. Pulmonary/Chest: Effort normal and breath sounds normal. She exhibits no mass and no bony tenderness.    Musculoskeletal:       Left shoulder: She exhibits decreased range of motion, tenderness and bony tenderness.       Left elbow: She exhibits normal range of motion and no swelling.       Left wrist: She exhibits normal range of motion, no tenderness, no bony tenderness and  no swelling.       Cervical back: Normal. She exhibits normal range of motion.       Pain worse with internal rotation. ttp subacromial region, anteriorly. Neg drop arm, positive impingement sign, positive neer's/empty can test.           Assessment & Plan:

## 2011-03-04 ENCOUNTER — Ambulatory Visit (INDEPENDENT_AMBULATORY_CARE_PROVIDER_SITE_OTHER): Payer: Medicare PPO | Admitting: Family Medicine

## 2011-03-04 ENCOUNTER — Other Ambulatory Visit (INDEPENDENT_AMBULATORY_CARE_PROVIDER_SITE_OTHER): Payer: Medicare PPO

## 2011-03-04 ENCOUNTER — Ambulatory Visit: Payer: Medicare PPO

## 2011-03-04 ENCOUNTER — Encounter (HOSPITAL_BASED_OUTPATIENT_CLINIC_OR_DEPARTMENT_OTHER): Payer: Medicare PPO | Attending: Plastic Surgery

## 2011-03-04 DIAGNOSIS — E538 Deficiency of other specified B group vitamins: Secondary | ICD-10-CM

## 2011-03-04 DIAGNOSIS — Z7901 Long term (current) use of anticoagulants: Secondary | ICD-10-CM

## 2011-03-04 DIAGNOSIS — L97809 Non-pressure chronic ulcer of other part of unspecified lower leg with unspecified severity: Secondary | ICD-10-CM | POA: Insufficient documentation

## 2011-03-04 DIAGNOSIS — Z5181 Encounter for therapeutic drug level monitoring: Secondary | ICD-10-CM

## 2011-03-04 DIAGNOSIS — E785 Hyperlipidemia, unspecified: Secondary | ICD-10-CM

## 2011-03-04 DIAGNOSIS — I4891 Unspecified atrial fibrillation: Secondary | ICD-10-CM

## 2011-03-04 LAB — LIPID PANEL
LDL Cholesterol: 109 mg/dL — ABNORMAL HIGH (ref 0–99)
Total CHOL/HDL Ratio: 4
Triglycerides: 88 mg/dL (ref 0.0–149.0)

## 2011-03-04 LAB — POCT INR: INR: 4.9

## 2011-03-04 LAB — VITAMIN B12: Vitamin B-12: 420 pg/mL (ref 211–911)

## 2011-03-04 NOTE — Patient Instructions (Signed)
Hold x 2 days,5 mg daily, except 2.5 mg M ,W,F, (decreased dose by 5 mg weekly) also holding x 2 days (10 mg) recheck in 1 week(patient also on antibiotics, this is her second week)

## 2011-03-05 ENCOUNTER — Ambulatory Visit: Payer: Medicare PPO

## 2011-03-05 ENCOUNTER — Other Ambulatory Visit: Payer: Medicare PPO

## 2011-03-09 ENCOUNTER — Other Ambulatory Visit: Payer: Self-pay | Admitting: *Deleted

## 2011-03-09 MED ORDER — BENZONATATE 100 MG PO CAPS: 200.0000 mg | ORAL_CAPSULE | Freq: Three times a day (TID) | ORAL | Status: DC | PRN

## 2011-03-09 NOTE — Telephone Encounter (Signed)
Pt states she has a cold and cough and is asking for a refill on tesselon [facility].

## 2011-03-11 NOTE — Progress Notes (Signed)
[facility]  NAME:  DECKLYN, HORNIK                 ACCOUNT NO.:  [id number]  MEDICAL RECORD NO.:  [id number]      DATE OF BIRTH:  Mar 26, 1929  PHYSICIAN:  Wayland Denis, DO       VISIT DATE:  03/11/2011                                  OFFICE VISIT   Ms. Knodel is an 77 year old black female who is here with family for followup on her right lower extremity wound.  She has done extremely well.  She has completely epithelialized and healed over.  She is very pleased with the results.  There has been no change in her medications or social history.  REVIEW OF SYSTEMS:  Negative.  PHYSICAL EXAMINATION:  GENERAL:  She is alert, oriented, cooperative, not in any acute distress, pleasant. EYES:  Pupils are equal.  Extraocular muscles are intact. LUNGS:  Breathing is unlabored. ABDOMEN:  Soft.  The wound is healed, epithelialized, no sign of infection.  She has got some hyperpigmentation that will take some time to even out and lighten up.  We recommend compression stockings on a routine basis to help with the swelling and lotion to help with the dryness.  She acknowledges understanding and agreeing with this plan and we will see her back as needed.     Wayland Denis, DO     CS/MEDQ  D:  03/11/2011  T:  03/11/2011  Job:  [id number]

## 2011-03-12 ENCOUNTER — Ambulatory Visit (INDEPENDENT_AMBULATORY_CARE_PROVIDER_SITE_OTHER): Payer: Medicare PPO | Admitting: Family Medicine

## 2011-03-12 ENCOUNTER — Encounter: Payer: Medicare PPO | Admitting: Family Medicine

## 2011-03-12 DIAGNOSIS — Z5181 Encounter for therapeutic drug level monitoring: Secondary | ICD-10-CM

## 2011-03-12 DIAGNOSIS — Z7901 Long term (current) use of anticoagulants: Secondary | ICD-10-CM

## 2011-03-12 DIAGNOSIS — I4891 Unspecified atrial fibrillation: Secondary | ICD-10-CM

## 2011-03-12 NOTE — Patient Instructions (Signed)
Continue 5 mg daily, except 2.5 mg M ,W,F, recheck 2 weeks

## 2011-03-23 ENCOUNTER — Telehealth: Payer: Self-pay | Admitting: *Deleted

## 2011-03-23 DIAGNOSIS — Z1231 Encounter for screening mammogram for malignant neoplasm of breast: Secondary | ICD-10-CM

## 2011-03-23 NOTE — Telephone Encounter (Signed)
Due for AMW after 11/9... So now anytime.

## 2011-03-23 NOTE — Telephone Encounter (Signed)
Patient called requesting an order for her mammogram. Patient states that she wants to go where she has had them done in the past in [place]. Patient also wants to know when she should schedule her physical?

## 2011-03-24 ENCOUNTER — Telehealth: Payer: Self-pay | Admitting: *Deleted

## 2011-03-24 MED ORDER — BENZONATATE 100 MG PO CAPS: 200.0000 mg | ORAL_CAPSULE | Freq: Three times a day (TID) | ORAL | Status: DC | PRN

## 2011-03-24 NOTE — Telephone Encounter (Signed)
No .. On lipitor at 80 mg... simvastain at 80 mg is higher risk for muscle pain and breakdown. Also in past control with simvastatin not as good.

## 2011-03-24 NOTE — Telephone Encounter (Signed)
Mammograms need no orders. She can go the place of her choosing and make her own appt.

## 2011-03-24 NOTE — Telephone Encounter (Signed)
Patient wants to change to simvastatin instead of lipitor can she do this.

## 2011-03-24 NOTE — Telephone Encounter (Signed)
Patient advised.

## 2011-03-26 ENCOUNTER — Ambulatory Visit (INDEPENDENT_AMBULATORY_CARE_PROVIDER_SITE_OTHER): Payer: Medicare PPO | Admitting: Family Medicine

## 2011-03-26 DIAGNOSIS — I4891 Unspecified atrial fibrillation: Secondary | ICD-10-CM

## 2011-03-26 DIAGNOSIS — Z5181 Encounter for therapeutic drug level monitoring: Secondary | ICD-10-CM

## 2011-03-26 DIAGNOSIS — Z7901 Long term (current) use of anticoagulants: Secondary | ICD-10-CM

## 2011-03-26 LAB — POCT INR: INR: 3.7

## 2011-03-26 NOTE — Patient Instructions (Signed)
Hold today, then 2.5 mg daily, 5 mg mwf( reduced by 2.5 mg)check in 2 weeks

## 2011-04-09 ENCOUNTER — Ambulatory Visit (INDEPENDENT_AMBULATORY_CARE_PROVIDER_SITE_OTHER): Payer: Medicare PPO | Admitting: Family Medicine

## 2011-04-09 DIAGNOSIS — Z7901 Long term (current) use of anticoagulants: Secondary | ICD-10-CM

## 2011-04-09 DIAGNOSIS — Z5181 Encounter for therapeutic drug level monitoring: Secondary | ICD-10-CM

## 2011-04-09 DIAGNOSIS — I4891 Unspecified atrial fibrillation: Secondary | ICD-10-CM

## 2011-04-09 LAB — POCT INR: INR: 2.9

## 2011-04-09 NOTE — Patient Instructions (Signed)
2.5 mg daily, 5 mg mwf 2  weeks

## 2011-04-13 ENCOUNTER — Encounter (HOSPITAL_BASED_OUTPATIENT_CLINIC_OR_DEPARTMENT_OTHER): Payer: Medicare PPO | Attending: Plastic Surgery

## 2011-04-13 DIAGNOSIS — I10 Essential (primary) hypertension: Secondary | ICD-10-CM | POA: Insufficient documentation

## 2011-04-13 DIAGNOSIS — I4891 Unspecified atrial fibrillation: Secondary | ICD-10-CM | POA: Insufficient documentation

## 2011-04-13 DIAGNOSIS — Z7901 Long term (current) use of anticoagulants: Secondary | ICD-10-CM | POA: Insufficient documentation

## 2011-04-13 DIAGNOSIS — Z79899 Other long term (current) drug therapy: Secondary | ICD-10-CM | POA: Insufficient documentation

## 2011-04-13 DIAGNOSIS — L97809 Non-pressure chronic ulcer of other part of unspecified lower leg with unspecified severity: Secondary | ICD-10-CM | POA: Insufficient documentation

## 2011-04-17 ENCOUNTER — Other Ambulatory Visit: Payer: Self-pay | Admitting: *Deleted

## 2011-04-17 MED ORDER — BENZONATATE 100 MG PO CAPS: 200.0000 mg | ORAL_CAPSULE | Freq: Three times a day (TID) | ORAL | Status: DC | PRN

## 2011-04-23 ENCOUNTER — Ambulatory Visit (INDEPENDENT_AMBULATORY_CARE_PROVIDER_SITE_OTHER): Payer: Medicare PPO | Admitting: Family Medicine

## 2011-04-23 DIAGNOSIS — Z5181 Encounter for therapeutic drug level monitoring: Secondary | ICD-10-CM

## 2011-04-23 DIAGNOSIS — I4891 Unspecified atrial fibrillation: Secondary | ICD-10-CM

## 2011-04-23 DIAGNOSIS — Z7901 Long term (current) use of anticoagulants: Secondary | ICD-10-CM

## 2011-04-23 NOTE — Patient Instructions (Signed)
Continue current dose, check in 4 weeks  

## 2011-04-29 ENCOUNTER — Encounter (HOSPITAL_BASED_OUTPATIENT_CLINIC_OR_DEPARTMENT_OTHER): Payer: Medicare PPO | Attending: Plastic Surgery

## 2011-04-29 DIAGNOSIS — L97809 Non-pressure chronic ulcer of other part of unspecified lower leg with unspecified severity: Secondary | ICD-10-CM | POA: Insufficient documentation

## 2011-04-29 NOTE — Progress Notes (Signed)
[facility]  NAME:  Angela Ali, Angela Ali                 ACCOUNT NO.:  [id number]  MEDICAL RECORD NO.:  [id number]      DATE OF BIRTH:  17-Feb-1929  PHYSICIAN:  Wayland Denis, DO       VISIT DATE:  04/29/2011                                  OFFICE VISIT   Angela Ali is an 78 year old black female who is here with her family for followup on her right lower extremity ulcer.  She originally had a hematoma that got debrided and healed and then recently she scratched it when putting on her stockings and it opened up a little bit on the inferomedial aspect.  She had a collagen and a Profore Lite placed with very good improvement.  We will continue with that.  PHYSICAL EXAMINATION:  GENERAL:  She is alert, oriented, cooperative, not in any acute distress.  She is very pleasant. HEENT:  Her pupils are equal.  Extraocular muscles are intact. NECK:  No cervical lymphadenopathy. LUNGS:  Her breathing is unlabored. HEART:  Her heart rate is regular.  The wound is as noted and described in nurse's note and improved according to the notes from last visit.  She does not have any significant swelling, no sign of infection.  No drainage.  Very small open area, appears to be healing.  No change in her medications or social history.  REVIEW OF SYSTEMS:  Negative.  ASSESSMENT:  Right lower extremity ulcer.  PLAN:  Continue collagen and Profore Lite and hopefully next week she will be able to go back for her sock.     Wayland Denis, DO     CS/MEDQ  D:  04/29/2011  T:  04/29/2011  Job:  [id number]

## 2011-04-30 ENCOUNTER — Encounter: Payer: Self-pay | Admitting: Family Medicine

## 2011-05-18 ENCOUNTER — Encounter: Payer: Self-pay | Admitting: Family Medicine

## 2011-05-18 ENCOUNTER — Ambulatory Visit (INDEPENDENT_AMBULATORY_CARE_PROVIDER_SITE_OTHER): Payer: Medicare PPO | Admitting: Family Medicine

## 2011-05-18 ENCOUNTER — Encounter (HOSPITAL_BASED_OUTPATIENT_CLINIC_OR_DEPARTMENT_OTHER): Payer: Medicare PPO

## 2011-05-18 DIAGNOSIS — R7309 Other abnormal glucose: Secondary | ICD-10-CM

## 2011-05-18 DIAGNOSIS — E785 Hyperlipidemia, unspecified: Secondary | ICD-10-CM

## 2011-05-18 DIAGNOSIS — I10 Essential (primary) hypertension: Secondary | ICD-10-CM

## 2011-05-18 DIAGNOSIS — Z5181 Encounter for therapeutic drug level monitoring: Secondary | ICD-10-CM

## 2011-05-18 DIAGNOSIS — Z7901 Long term (current) use of anticoagulants: Secondary | ICD-10-CM

## 2011-05-18 DIAGNOSIS — I4891 Unspecified atrial fibrillation: Secondary | ICD-10-CM

## 2011-05-18 DIAGNOSIS — Z0000 Encounter for general adult medical examination without abnormal findings: Secondary | ICD-10-CM

## 2011-05-18 DIAGNOSIS — I509 Heart failure, unspecified: Secondary | ICD-10-CM

## 2011-05-18 LAB — HM DIABETES FOOT EXAM

## 2011-05-18 LAB — POCT INR: INR: 2.5

## 2011-05-18 NOTE — Assessment & Plan Note (Signed)
Inadequate control in 02/2011.. Due for recheck. On statin.

## 2011-05-18 NOTE — Assessment & Plan Note (Signed)
Well controlled. Encouraged exercise, weight loss, healthy eating habits.   

## 2011-05-18 NOTE — Assessment & Plan Note (Signed)
Poor control. Not tolerating higher dose of clonidine.. She requests med change.  Given complexity of pt and meds.. Will get cardiologists input. She likely would tolerate higher dose of cardizem regularly with pulse where it is today.

## 2011-05-18 NOTE — Assessment & Plan Note (Addendum)
Rate controlled, euvolemic. On coumadin, therapeutic.

## 2011-05-18 NOTE — Patient Instructions (Signed)
Continue  2.5 mg daily, 5 mg mwf recheck  4   weeks

## 2011-05-18 NOTE — Patient Instructions (Addendum)
We will call with recommendations on bloop pressure med changes. Call if mood not improving as expected with time.  Consider shingles vaccine. Consider living will. Return for  Fasting labs in  February.

## 2011-05-18 NOTE — Progress Notes (Signed)
Subjective:    Patient ID: Angela Ali, female    DOB: 1928-04-29, 78 y.o.   MRN: [id number]  HPI I have personally reviewed the Medicare Annual Wellness questionnaire and have noted 1. The patient's medical and social history 2. Their use of alcohol, tobacco or illicit drugs 3. Their current medications and supplements 4. The patient's functional ability including ADL's, fall risks, home safety risks and hearing or visual             impairment. 5. Diet and physical activities 6. Evidence for depression or mood disorders  The patients weight, height, BMI and visual acuity have been recorded in the chart I have made referrals, counseling and provided education to the patient based review of the above and I have provided the pt with a written personalized care plan for preventive services.  32 year old daughter passed away in last 2 weeks.  She is handling this moderately well. Moderate sleeping at night. She is sad, tearful a lot.  No anxiety.  Not interested in medication.   Elevated Cholesterol:  Above goal at last check on lipitor. Using medications without problems: Muscle aches:  Diet compliance:poor Exercise:None due to recovering from hematoma Other complaints:  Hypertension:  Poor control on lisinopril 40 mg, metoprolol, lasix, cardizem, lanoxin, clonidine recently increased to 0.3 mg BID (can not take tab at night given it makes her very sleepy the next day). Followed by Dr.Gollan. She was unable to tolerate hydralazine because of headache.  Now only occasional episodes of heart racing.  Has not needed any extra doses of diltiazem for hear racing.  Using medication without problems or lightheadedness: None Chest pain with exertion:None Edema:None Short of breath:None Average home BPs: At funeral 200/104, usually 160-170/80-90 Other issues:Afib, CHF: euvolemic  Hematoma healing.  Diabetes: Well controlled on no medicaitons. Lab Results  Component Value Date     HGBA1C 6.4 11/28/2010   Hypoglycemic episodes:None Hyperglycemic episodes:None Feet problems:None Blood Sugars averaging: FBS: 108-124 eye exam within last year:yes    Review of Systems  Constitutional: Negative for fever and fatigue.  HENT: Negative for ear pain.   Eyes: Negative for pain.  Respiratory: Negative for chest tightness and shortness of breath.   Cardiovascular: Negative for chest pain, palpitations and leg swelling.  Gastrointestinal: Negative for abdominal pain.  Genitourinary: Negative for dysuria.  Neurological: Negative for syncope and light-headedness.  Psychiatric/Behavioral: Positive for dysphoric mood.       Objective:   Physical Exam  Constitutional: Vital signs are normal. She appears well-developed and well-nourished. She is cooperative.  Non-toxic appearance. She does not appear ill. No distress.  HENT:  Head: Normocephalic.  Right Ear: Hearing, tympanic membrane, external ear and ear canal normal.  Left Ear: Hearing, tympanic membrane, external ear and ear canal normal.  Nose: Nose normal.  Eyes: Conjunctivae, EOM and lids are normal. Pupils are equal, round, and reactive to light. No foreign bodies found.  Neck: Trachea normal and normal range of motion. Neck supple. Carotid bruit is not present. No mass and no thyromegaly present.  Cardiovascular: Normal rate, S1 normal, S2 normal and intact distal pulses.  An irregular rhythm present. Exam reveals distant heart sounds. Exam reveals no gallop.   No murmur heard.      Slight B peripheral edema  Pulmonary/Chest: Effort normal and breath sounds normal. No respiratory distress. She has no wheezes. She has no rhonchi. She has no rales.  Abdominal: Soft. Normal appearance and bowel sounds are normal. She exhibits  no distension, no fluid wave, no abdominal bruit and no mass. There is no hepatosplenomegaly. There is no tenderness. There is no rebound, no guarding and no CVA tenderness. No hernia.   Lymphadenopathy:    She has no cervical adenopathy.    She has no axillary adenopathy.  Neurological: She is alert. She has normal strength. No cranial nerve deficit or sensory deficit.  Skin: Skin is warm, dry and intact. No rash noted.  Psychiatric: Her speech is normal and behavior is normal. Judgment normal. Her mood appears not anxious. Cognition and memory are normal. She does not exhibit a depressed mood.     Diabetic foot exam: Normal inspection No skin breakdown No calluses  Normal DP pulses Normal sensation to light touch and monofilament Nails thickened      Assessment & Plan:  AMW: The patient's preventative maintenance and recommended screening tests for an annual wellness exam were reviewed in full today. Brought up to date unless services declined.  Counselled on the importance of diet, exercise, and its role in overall health and mortality. The patient's FH and SH was reviewed, including their home life, tobacco status, and drug and alcohol status.   Vaccines:Uptodate with flu, PNA and Td, considering shingles vaccine. No indication for pap, DVE given age, hysterectomy. Mammo: nml 04/2011 Colon: Nml in 2009, per Dr. Arlyce Dice repeat in 5 years (2014) Nonsmoker. DEXA: osteopenia, improved last check in 02/2009

## 2011-05-21 ENCOUNTER — Telehealth: Payer: Self-pay | Admitting: Family Medicine

## 2011-05-21 NOTE — Telephone Encounter (Signed)
Error

## 2011-05-25 ENCOUNTER — Telehealth: Payer: Self-pay | Admitting: Family Medicine

## 2011-05-25 NOTE — Telephone Encounter (Signed)
Please call pt.. Discussed BP elevations with Dr. Mariah Milling.  What time of day is she taking the PM dose of clonidine? Let me know. He suggested we try that earlier in day to see if less sedation the following day.

## 2011-05-25 NOTE — Telephone Encounter (Signed)
Patient says that she takes the pm in clonidine at bedtime. I advised her to take it at 3-4 in the afternoon.

## 2011-05-25 NOTE — Telephone Encounter (Signed)
Message copied by Excell Seltzer on Mon May 25, 2011  9:24 AM ------      Message from: Dossie Arbour J      Created: Wed May 20, 2011  8:43 AM      Regarding: RE: BP control       She is tough...      Do you think she might do the PM clonidine earlier in the day to avoid fatigue the following AM?      Sometimes I have had luck using the patch. (.3 mg) once a week.      I have a few folks on two patches at a time. Drops their number of pills way down.            We could always try isosorbide (imdur daily or dinitrate TID)      Let me know what you think.            Thx      Tim            ----- Message -----         From: Kerby Nora, MD         Sent: 05/18/2011   9:09 AM           To: Antonieta Iba, MD      Subject: BP control                                                Ms. Kerney Elbe has been having elevated BPS in last month.. 160-200/90-104. Lost daughter in last 2 weeks.       She has not tolerated higher dose of clonidine well.  What would you recommend at this point for BP control?

## 2011-06-08 ENCOUNTER — Telehealth: Payer: Self-pay | Admitting: Family Medicine

## 2011-06-08 NOTE — Telephone Encounter (Signed)
Script written

## 2011-06-08 NOTE — Telephone Encounter (Signed)
Aileen Fass, patients daughter called. Patient wants a rolling walker to assist with walking. Daughter asked that you call pt to discuss.

## 2011-06-09 NOTE — Telephone Encounter (Signed)
Spoke with patient daughter and faxed walker rx over to medical supplier

## 2011-06-10 ENCOUNTER — Ambulatory Visit (INDEPENDENT_AMBULATORY_CARE_PROVIDER_SITE_OTHER): Payer: Medicare PPO | Admitting: Family Medicine

## 2011-06-10 ENCOUNTER — Other Ambulatory Visit (INDEPENDENT_AMBULATORY_CARE_PROVIDER_SITE_OTHER): Payer: Medicare PPO

## 2011-06-10 DIAGNOSIS — R7309 Other abnormal glucose: Secondary | ICD-10-CM

## 2011-06-10 DIAGNOSIS — Z5181 Encounter for therapeutic drug level monitoring: Secondary | ICD-10-CM

## 2011-06-10 DIAGNOSIS — I4891 Unspecified atrial fibrillation: Secondary | ICD-10-CM

## 2011-06-10 DIAGNOSIS — I10 Essential (primary) hypertension: Secondary | ICD-10-CM

## 2011-06-10 DIAGNOSIS — Z7901 Long term (current) use of anticoagulants: Secondary | ICD-10-CM

## 2011-06-10 LAB — LIPID PANEL
Cholesterol: 147 mg/dL (ref 0–200)
HDL: 51.3 mg/dL (ref >39.00)
Triglycerides: 80 mg/dL (ref 0.0–149.0)
VLDL: 16 mg/dL (ref 0.0–40.0)

## 2011-06-10 LAB — COMPREHENSIVE METABOLIC PANEL
ALT: 13 U/L (ref 0–35)
BUN: 9 mg/dL (ref 6–23)
CO2: 31 mEq/L (ref 19–32)
Calcium: 10.2 mg/dL (ref 8.4–10.5)
Chloride: 103 mEq/L (ref 96–112)
Creatinine, Ser: 0.6 mg/dL (ref 0.4–1.2)
GFR: 133.86 mL/min (ref >60.00)
Glucose, Bld: 109 mg/dL — ABNORMAL HIGH (ref 70–99)
Total Bilirubin: 0.8 mg/dL (ref 0.3–1.2)

## 2011-06-10 NOTE — Patient Instructions (Signed)
Continue current dose, check in 4 weeks  

## 2011-06-11 LAB — VITAMIN D 25 HYDROXY (VIT D DEFICIENCY, FRACTURES): Vit D, 25-Hydroxy: 34 ng/mL (ref 30–89)

## 2011-07-04 ENCOUNTER — Other Ambulatory Visit: Payer: Self-pay | Admitting: Family Medicine

## 2011-07-08 ENCOUNTER — Ambulatory Visit (INDEPENDENT_AMBULATORY_CARE_PROVIDER_SITE_OTHER): Payer: Medicare PPO | Admitting: Family Medicine

## 2011-07-08 ENCOUNTER — Ambulatory Visit: Payer: Medicare PPO

## 2011-07-08 DIAGNOSIS — I4891 Unspecified atrial fibrillation: Secondary | ICD-10-CM

## 2011-07-08 DIAGNOSIS — Z7901 Long term (current) use of anticoagulants: Secondary | ICD-10-CM

## 2011-07-08 DIAGNOSIS — Z5181 Encounter for therapeutic drug level monitoring: Secondary | ICD-10-CM

## 2011-07-08 NOTE — Patient Instructions (Signed)
Continue current dose, check in 4 weeks  

## 2011-07-22 ENCOUNTER — Encounter: Payer: Self-pay | Admitting: Cardiovascular Disease

## 2011-07-22 ENCOUNTER — Ambulatory Visit (INDEPENDENT_AMBULATORY_CARE_PROVIDER_SITE_OTHER): Payer: Medicare PPO | Admitting: Cardiovascular Disease

## 2011-07-22 VITALS — BP 138/100 | HR 57 | Ht 65.0 in | Wt 183.0 lb

## 2011-07-22 DIAGNOSIS — E663 Overweight: Secondary | ICD-10-CM

## 2011-07-22 DIAGNOSIS — I4891 Unspecified atrial fibrillation: Secondary | ICD-10-CM

## 2011-07-22 DIAGNOSIS — I10 Essential (primary) hypertension: Secondary | ICD-10-CM

## 2011-07-22 DIAGNOSIS — R609 Edema, unspecified: Secondary | ICD-10-CM

## 2011-07-22 DIAGNOSIS — E785 Hyperlipidemia, unspecified: Secondary | ICD-10-CM

## 2011-07-22 NOTE — Progress Notes (Signed)
Patient ID: Angela Ali, female    DOB: [birth year], 78 y.o.   MRN: [id number]  HPI Comments: Angela Ali is a very pleasant 78 year old woman with a history of chronic atrial fibrillation on warfarin, hypertension, chronic lower extremity edema, shortness of breath, who presents for routine followup. She had a fall with hematoma last year and has required care from the wound clinic for a large ulcerative wound on the right lateral aspect of her leg.   She was taken off the diltiazem because of lower extremity edema ( long acting dose). Her wound has now healed. She continues to have mild lower extremity edema bilaterally. She takes her Lasix periodically. She reports that her blood pressure is better at home than it was today in the office. She denies any significant palpitations. Otherwise she feels well. No episodes of chest pain, lightheadedness or dizziness. She is walking with a walker.  She was unable to tolerate hydralazine because of headache.  EKG shows atrial fibrillation with ventricular rate of 57 beats per minute, no significant ST or T wave changes    Outpatient Encounter Prescriptions as of 07/22/2011  Medication Sig Dispense Refill  . albuterol (VENTOLIN HFA) 108 (90 BASE) MCG/ACT inhaler Inhale 2 puffs into the lungs every 4 (four) hours as needed. For cough and wheeze       . alendronate (FOSAMAX) 70 MG tablet Take 70 mg by mouth every 7 (seven) days. Take with a full glass of water on an empty stomach.       Marland Kitchen atorvastatin (LIPITOR) 80 MG tablet TAKE 1 TABLET DAILY  90 tablet  3  . benzonatate (TESSALON PERLES) 100 MG capsule Take 2 capsules (200 mg total) by mouth 3 (three) times daily as needed for cough.  90 capsule  0  . cloNIDine (CATAPRES) 0.2 MG tablet TAKE 1 TABLET TWICE DAILY  180 tablet  3  . cloNIDine (CATAPRES) 0.2 MG tablet Take 0.2 mg by mouth 2 (two) times daily.      . digoxin (LANOXIN) 0.125 MG tablet Take 1 tablet (125 mcg total) by mouth daily.  90  tablet  3  . fexofenadine (ALLEGRA) 60 MG tablet Take 60 mg by mouth 2 (two) times daily. Ass needed for allergies       . fluorometholone (FML) 0.1 % ophthalmic suspension 1 drop. As needed       . fluticasone (FLONASE) 50 MCG/ACT nasal spray Place 2 sprays into the nose daily.  48 g  2  . Fluticasone-Salmeterol (ADVAIR DISKUS) 250-50 MCG/DOSE AEPB Inhale 1 puff into the lungs every 12 (twelve) hours. Rinse after use       . furosemide (LASIX) 80 MG tablet Take 1 tablet (80 mg total) by mouth daily.  90 tablet  3  . lisinopril (PRINIVIL,ZESTRIL) 40 MG tablet Take 1 tablet (40 mg total) by mouth daily.  90 tablet  3  . metoprolol (TOPROL-XL) 200 MG 24 hr tablet TAKE 1 TABLET BY MOUTH EVERY DAY  30 tablet  4  . omeprazole (PRILOSEC) 20 MG capsule Take 1 capsule (20 mg total) by mouth daily.  90 capsule  3  . warfarin (COUMADIN) 5 MG tablet Take one tablet by mouth once a day as directed  90 tablet  3    Review of Systems  Constitutional: Negative.   HENT: Negative.   Eyes: Negative.   Respiratory: Negative.   Cardiovascular: Positive for leg swelling.  Gastrointestinal: Negative.   Musculoskeletal: Positive for gait problem.  Skin: Negative.   Neurological: Negative.   Hematological: Negative.   Psychiatric/Behavioral: Negative.   All other systems reviewed and are negative.   BP 138/100  Pulse 57  Ht 5\' 5"  (1.651 m)  Wt 183 lb (83.008 kg)  BMI 30.45 kg/m2  Physical Exam  Nursing note and vitals reviewed. Constitutional: She is oriented to person, place, and time. She appears well-developed and well-nourished.       Obese   HENT:  Head: Normocephalic.  Nose: Nose normal.  Mouth/Throat: Oropharynx is clear and moist.  Eyes: Conjunctivae are normal. Pupils are equal, round, and reactive to light.  Neck: Normal range of motion. Neck supple. No JVD present.  Cardiovascular: Normal rate, regular rhythm, S1 normal, S2 normal, normal heart sounds and intact distal pulses.  Exam  reveals no gallop and no friction rub.   No murmur heard. Pulmonary/Chest: Effort normal and breath sounds normal. No respiratory distress. She has no wheezes. She has no rales. She exhibits no tenderness.  Abdominal: Soft. Bowel sounds are normal. She exhibits no distension. There is no tenderness.  Musculoskeletal: Normal range of motion. She exhibits edema. She exhibits no tenderness.  Lymphadenopathy:    She has no cervical adenopathy.  Neurological: She is alert and oriented to person, place, and time. Coordination normal.  Skin: Skin is warm and dry. No rash noted. No erythema.  Psychiatric: She has a normal mood and affect. Her behavior is normal. Judgment and thought content normal.         Assessment and Plan

## 2011-07-22 NOTE — Assessment & Plan Note (Signed)
Diastolic pressure is elevated. We have suggested she use her Lasix more regularly given her edema. I have asked her to monitor her blood pressure at home

## 2011-07-22 NOTE — Patient Instructions (Signed)
You are doing well. No medication changes were made. Please take more lasix daily  Please call us if you have new issues that need to be addressed before your next appt.  Your physician wants you to follow-up in: 6 months.  You will receive a reminder letter in the mail two months in advance. If you don't receive a letter, please call our office to schedule the follow-up appointment.

## 2011-07-22 NOTE — Assessment & Plan Note (Signed)
We have encouraged continued exercise, careful diet management in an effort to lose weight. 

## 2011-07-22 NOTE — Assessment & Plan Note (Signed)
For her edema, and I suggested she take her Lasix on a more regular basis.

## 2011-07-22 NOTE — Assessment & Plan Note (Signed)
Good rate control. No changes to her medications made. Would continue warfarin.

## 2011-07-22 NOTE — Assessment & Plan Note (Signed)
Cholesterol is at goal on the current lipid regimen. No changes to the medications were made.  

## 2011-08-04 ENCOUNTER — Telehealth: Payer: Self-pay

## 2011-08-04 NOTE — Telephone Encounter (Signed)
Pt called at 5pm requesting appt for 08/05/11 when pt has PT done. Pt said 08/03/11 started with upper right side pain and had loose BM x 2 today. Pt had no fever,no N&V. If pt worsens tonight pt will go to UC.

## 2011-08-05 ENCOUNTER — Encounter: Payer: Self-pay | Admitting: Family Medicine

## 2011-08-05 ENCOUNTER — Ambulatory Visit (INDEPENDENT_AMBULATORY_CARE_PROVIDER_SITE_OTHER): Payer: Medicare PPO | Admitting: Family Medicine

## 2011-08-05 VITALS — BP 118/72 | HR 83 | Temp 98.4°F | Ht 64.0 in | Wt 182.1 lb

## 2011-08-05 DIAGNOSIS — I4891 Unspecified atrial fibrillation: Secondary | ICD-10-CM

## 2011-08-05 DIAGNOSIS — Z452 Encounter for adjustment and management of vascular access device: Secondary | ICD-10-CM

## 2011-08-05 DIAGNOSIS — R161 Splenomegaly, not elsewhere classified: Secondary | ICD-10-CM

## 2011-08-05 DIAGNOSIS — Z5181 Encounter for therapeutic drug level monitoring: Secondary | ICD-10-CM

## 2011-08-05 DIAGNOSIS — R109 Unspecified abdominal pain: Secondary | ICD-10-CM

## 2011-08-05 DIAGNOSIS — Z7902 Long term (current) use of antithrombotics/antiplatelets: Secondary | ICD-10-CM

## 2011-08-05 LAB — BASIC METABOLIC PANEL
BUN: 10 mg/dL (ref 6–23)
Chloride: 102 mEq/L (ref 96–112)
GFR: 97.01 mL/min (ref >60.00)
Glucose, Bld: 102 mg/dL — ABNORMAL HIGH (ref 70–99)
Potassium: 4.4 mEq/L (ref 3.5–5.1)
Sodium: 142 mEq/L (ref 135–145)

## 2011-08-05 LAB — HEPATIC FUNCTION PANEL
ALT: 15 U/L (ref 0–35)
AST: 14 U/L (ref 0–37)
Albumin: 4.4 g/dL (ref 3.5–5.2)

## 2011-08-05 LAB — CBC WITH DIFFERENTIAL/PLATELET
Eosinophils Relative: 2.6 % (ref 0.0–5.0)
HCT: 39.9 % (ref 36.0–46.0)
Hemoglobin: 13.1 g/dL (ref 12.0–15.0)
Lymphs Abs: 2.3 10*3/uL (ref 0.7–4.0)
MCV: 88.9 fl (ref 78.0–100.0)
Monocytes Absolute: 0.6 10*3/uL (ref 0.1–1.0)
Monocytes Relative: 6.3 % (ref 3.0–12.0)
Neutro Abs: 6 10*3/uL (ref 1.4–7.7)
RDW: 15.6 % — ABNORMAL HIGH (ref 11.5–14.6)
WBC: 9.2 10*3/uL (ref 4.5–10.5)

## 2011-08-05 LAB — LIPASE: Lipase: 25 U/L (ref 11.0–59.0)

## 2011-08-05 MED ORDER — RANITIDINE HCL 150 MG PO TABS: 150.0000 mg | ORAL_TABLET | Freq: Two times a day (BID) | ORAL | Status: DC

## 2011-08-05 NOTE — Progress Notes (Addendum)
Patient Name: Angela Ali Date of Birth: [birth year] Age: 78 y.o. Medical Record Number: [id number] Gender: female Date of Encounter: 08/05/2011  History of Present Illness:  Angela Ali is a 78 y.o. very pleasant female patient who presents with the following:  Side on the R -- feels like it is burning. No sharp pains.  Eating and drinking OK. Eating and drinking ok No fever or chills. No burning with urination  Had a BM yesterday - 3 times.  Pleasant elderly female with a history of cholecystectomy, hysterectomy, and removal of her adrenal gland distantly who presents with abdominal pain over approximately the last week. She is eating and drinking okay. No fevers or chills. No burning with urination, but she does have abdominal pain and flank pain. She is has normal bowel movements yesterday. Today, she has noticed that she has had some decreased an absence of flatus.  Multiple medical comorbidities are listed below.  Patient Active Problem List  Diagnoses  . COLONIC POLYPS, HYPERPLASTIC  . BENIGN NEOPLASM OTH&UNSPEC SITE DIGESTIVE SYSTEM  . UNSPECIFIED VITAMIN D DEFICIENCY  . HYPERLIPIDEMIA  . OVERWEIGHT/OBESITY  . HYPERTENSION  . CARDIOMYOPATHY, SECONDARY  . ATRIAL FIBRILLATION  . CONGESTIVE HEART FAILURE  . ALLERGIC RHINITIS  . ASTHMA, PERSISTENT, MODERATE  . STRICTURE, ESOPHAGEAL  . GERD  . HIATAL HERNIA  . DIVERTICULOSIS, COLON  . OSTEOARTHRITIS  . KNEE PAIN, RIGHT, CHRONIC  . HERNIATED LUMBAR DISC  . LOW BACK PAIN, CHRONIC  . FOOT PAIN, LEFT  . OSTEOPOROSIS  . FATIGUE, CHRONIC  . PERIPHERAL EDEMA  . COUGH  . Diabetes mellitus type 2 with complications  . Hematoma of leg   Past Medical History  Diagnosis Date  . Overweight   . Secondary cardiomyopathy, unspecified   . Atrial flutter   . Congestive heart failure, unspecified   . Edema   . Other and unspecified hyperlipidemia   . Unspecified essential hypertension   . Cough   . Allergic  rhinitis, cause unspecified   . Acute upper respiratory infections of unspecified site   . Other dysphagia   . Benign neoplasm of colon   . Diverticulosis of colon (without mention of hemorrhage)   . Benign neoplasm of other and unspecified site of the digestive system   . Other malaise and fatigue   . Stricture and stenosis of esophagus   . Bacterial pneumonia, unspecified   . Cough   . Other abnormal glucose   . Esophageal reflux   . Osteoarthrosis, unspecified whether generalized or localized, unspecified site   . Pain in joint, lower leg   . Displacement of lumbar intervertebral disc without myelopathy   . Diaphragmatic hernia without mention of obstruction or gangrene   . Encounter for long-term (current) use of anticoagulants   . Atrial fibrillation   . Prediabetes   . Chronic knee pain     Right   Past Surgical History  Procedure Date  . Esophageal dilation 01/2007  . Abdominal hysterectomy 1971    partial  . Ankle surgery 2001  . Cholecystectomy 2004   History  Substance Use Topics  . Smoking status: Never Smoker   . Smokeless tobacco: Not on file  . Alcohol Use: No   Family History  Problem Relation Age of Onset  . Emphysema Father 54  . Coronary artery disease Mother 26  . Hypertension Mother 24  . Hyperlipidemia Sister     # 1  . Hyperlipidemia Sister     # 2  .  Diabetes Daughter     # 1  . Hypertension Daughter   . Diabetes Daughter     # 2   . Hypertension Daughter   . Ovarian cancer Daughter   . Cancer Son     bone marrow ca   Allergies  Allergen Reactions  . Percocet (Oxycodone-Acetaminophen)     Nausea    Current Outpatient Prescriptions on File Prior to Visit  Medication Sig Dispense Refill  . atorvastatin (LIPITOR) 80 MG tablet TAKE 1 TABLET DAILY  90 tablet  3  . benzonatate (TESSALON PERLES) 100 MG capsule Take 2 capsules (200 mg total) by mouth 3 (three) times daily as needed for cough.  90 capsule  0  . cloNIDine (CATAPRES) 0.2 MG  tablet TAKE 1 TABLET TWICE DAILY  180 tablet  3  . digoxin (LANOXIN) 0.125 MG tablet Take 1 tablet (125 mcg total) by mouth daily.  90 tablet  3  . diltiazem (CARDIZEM) 30 MG tablet Take 1 tablet (30 mg total) by mouth 4 (four) times daily as needed (Take for fast heart rhythm).  90 tablet  6  . fluticasone (FLONASE) 50 MCG/ACT nasal spray Place 2 sprays into the nose daily.  48 g  2  . furosemide (LASIX) 80 MG tablet Take 1 tablet (80 mg total) by mouth daily.  90 tablet  3  . lisinopril (PRINIVIL,ZESTRIL) 40 MG tablet Take 1 tablet (40 mg total) by mouth daily.  90 tablet  3  . metoprolol (TOPROL-XL) 200 MG 24 hr tablet TAKE 1 TABLET BY MOUTH EVERY DAY  30 tablet  4  . omeprazole (PRILOSEC) 20 MG capsule Take 1 capsule (20 mg total) by mouth daily.  90 capsule  3  . warfarin (COUMADIN) 5 MG tablet Take one tablet by mouth once a day as directed  90 tablet  3  . ranitidine (ZANTAC) 150 MG tablet Take 1 tablet (150 mg total) by mouth 2 (two) times daily.  60 tablet  2  . DISCONTD: cloNIDine (CATAPRES) 0.2 MG tablet Take 0.2 mg by mouth 2 (two) times daily.      Marland Kitchen DISCONTD: esomeprazole (NEXIUM) 40 MG capsule Take 40 mg by mouth daily.           Past Medical History, Surgical History, Social History, Family History, Problem List, Medications, and Allergies have been reviewed and updated if relevant.  Review of Systems: ROS: GEN: Acute illness details above GI: Tolerating PO intake GU: maintaining adequate hydration and urination Pulm: No SOB Interactive and getting along well at home.  Otherwise, ROS is as per the HPI.   Physical Examination: Filed Vitals:   08/05/11 1002  BP: 118/72  Pulse: 83  Temp: 98.4 F (36.9 C)  TempSrc: Oral  Height: 5\' 4"  (1.626 m)  Weight: 182 lb 1.9 oz (82.609 kg)  SpO2: 99%    Body mass index is 31.26 kg/(m^2).  GEN: WDWN, NAD, Non-toxic, A & O x 3 HEENT: Atraumatic, Normocephalic. Neck supple. No masses, No LAD. Ears and Nose: No external  deformity. CV: irreg, irreg. PULM: CTA B, no wheezes, crackles, rhonchi. No retractions. No resp. distress. No accessory muscle use. ABD: S, mild to moderate abdominal pain right upper quadrant, epigastric, and right lower quadrants, ND, +BS. No rebound. No HSM. EXTR: No c/c/e NEURO Normal gait.  PSYCH: Normally interactive. Conversant. Not depressed or anxious appearing.  Calm demeanor.    Assessment and Plan:  1. Abdominal  pain, other specified site  Basic metabolic panel,  CBC with Differential, Hepatic function panel, Lipase, CT Abdomen Pelvis W Contrast   Abdominal pain of unclear etiology. Labs above.  CT of the abdomen and pelvis with contrast to evaluate for unclear etiology of abdominal pain. Multiple prior surgical procedures. Evaluate for potential adhesions, potential perforated bowel, diverticulitis, colitis, or other intra-abdominal pathology.  The patient does also have a history of a hiatal hernia, we will add some Zantac  Orders Today: Orders Placed This Encounter  Procedures  . CT Abdomen Pelvis W Contrast    Standing Status: Future     Number of Occurrences:      Standing Expiration Date: 11/04/2012    Order Specific Question:  Preferred imaging location?    Answer:  [place]    Order Specific Question:  Reason for exam:    Answer:  abdominal pain, unclear source  . Basic metabolic panel  . CBC with Differential  . Hepatic function panel  . Lipase    Medications Today: Meds ordered this encounter  Medications  . ranitidine (ZANTAC) 150 MG tablet    Sig: Take 1 tablet (150 mg total) by mouth 2 (two) times daily.    Dispense:  60 tablet    Refill:  2     Addendum:  Ct Abdomen Pelvis W Contrast  08/06/2011  *RADIOLOGY REPORT*  Clinical Data: Right abdominal pain for 1 week with episode of diarrhea.  Multiple medical problems; no given history of malignancy.  CT ABDOMEN AND PELVIS WITH CONTRAST  Technique:  Multidetector CT imaging of the abdomen  and pelvis was performed following the standard protocol during bolus administration of intravenous contrast.  Contrast: OMNIPAQUE IOHEXOL 300 MG/ML  SOLN  Comparison: None relevant.  Findings: There is minimal linear scarring or atelectasis in both lung bases.  No significant pleural effusion is demonstrated. There is a moderate sized hiatal hernia.  The spleen is diffusely abnormal with multiple enhancing masses.  A representative lesion measures up to 4.2 cm on image 17.  These lesions are estimated to replace greater than 50% of the splenic volume.  The spleen does not appear significantly enlarged.  There is no perisplenic inflammatory change or fluid collection.  The liver demonstrates several sub centimeter low density lesions which are likely cysts.  In addition, there is an ill-defined low density lesion measuring 1.8 cm on image 10.  The right adrenal gland is mildly prominent without well-defined mass.  The left adrenal gland is not clearly seen.  There are several surgical clips superior to the left kidney and adjacent to the pancreatic tail.  Question prior left adrenalectomy.  The gallbladder is surgically absent.  There is no biliary dilatation.  The pancreas appears normal.  There are several small renal cysts, the largest in the lower pole of the right kidney, measuring 2.7 cm in diameter.  There is no hydronephrosis.  No enlarged abdominal pelvic lymph nodes are identified.  The bowel gas pattern is normal.  The appendix appears normal.  The patient is status post hysterectomy.  Low density in the left adnexa on image 57 may represent residual left ovarian tissue.  No suspicious mass is identified. There is mild atherosclerosis for age.  There is mild lumbar spondylosis.  No suspicious osseous findings are seen.  IMPRESSION:  1.  Diffusely abnormal spleen with multiple enhancing masses. Appearance is nonspecific with considerations including metastatic disease, lymphoma and granulomatous  disease (sarcoidosis or fungal infection). Multiple hemangiomas and hamartomas are considered less likely.  MRI is occasionally  helpful in narrowing the differential. 2.  Question prior left adrenal resection.  Correlate clinically. The right adrenal gland is prominent without discrete mass. 3.  Nonspecific liver lesions with one ill-defined lesion involving the right hepatic lobe, potentially related to splenic process. 4.  No other evidence of extra splenic neoplasm or lymphadenopathy.  Original Report Authenticated By: Gerrianne Scale, M.D.    Multiple enhancing lesion in the spleen. Further evaluation is indicated to define whether these are benign lesions or potentially neoplastic. Will obtain an MRI with and without contrast of the abdomen to better characterize these splenic lesions.

## 2011-08-05 NOTE — Patient Instructions (Signed)
REFERRAL: GO THE THE FRONT ROOM AT THE ENTRANCE OF OUR CLINIC, NEAR CHECK IN. ASK FOR MARION. SHE WILL HELP YOU SET UP YOUR REFERRAL. DATE: TIME:  

## 2011-08-05 NOTE — Telephone Encounter (Signed)
Patient has appt today.

## 2011-08-05 NOTE — Patient Instructions (Addendum)
Continue  2.5 mg daily, 5 mg mwfsat recheck  2   Weeks ( increased by 2.5 mg)

## 2011-08-06 ENCOUNTER — Ambulatory Visit (INDEPENDENT_AMBULATORY_CARE_PROVIDER_SITE_OTHER)
Admission: RE | Admit: 2011-08-06 | Discharge: 2011-08-06 | Disposition: A | Discharge status: Home / Self Care | Payer: Medicare PPO | Source: Ambulatory Visit | Attending: Family Medicine | Admitting: Family Medicine

## 2011-08-06 DIAGNOSIS — R109 Unspecified abdominal pain: Secondary | ICD-10-CM

## 2011-08-06 MED ORDER — IOHEXOL 300 MG/ML  SOLN
100.0000 mL | Freq: Once | INTRAMUSCULAR | Status: AC | PRN
  Administered 2011-08-06: 100 mL via INTRAVENOUS

## 2011-08-06 NOTE — Progress Notes (Signed)
Addended by: Hannah Beat on: 08/06/2011 11:42 AM   Modules accepted: Orders

## 2011-08-09 ENCOUNTER — Ambulatory Visit
Admission: RE | Admit: 2011-08-09 | Discharge: 2011-08-09 | Disposition: A | Discharge status: Home / Self Care | Payer: Medicare PPO | Source: Ambulatory Visit | Attending: Family Medicine | Admitting: Family Medicine

## 2011-08-09 DIAGNOSIS — R161 Splenomegaly, not elsewhere classified: Secondary | ICD-10-CM

## 2011-08-09 MED ORDER — GADOBENATE DIMEGLUMINE 529 MG/ML IV SOLN
17.0000 mL | Freq: Once | INTRAVENOUS | Status: AC | PRN
  Administered 2011-08-09: 17 mL via INTRAVENOUS

## 2011-08-11 ENCOUNTER — Other Ambulatory Visit: Payer: Self-pay | Admitting: Family Medicine

## 2011-08-11 DIAGNOSIS — R935 Abnormal findings on diagnostic imaging of other abdominal regions, including retroperitoneum: Secondary | ICD-10-CM

## 2011-08-11 DIAGNOSIS — R161 Splenomegaly, not elsewhere classified: Secondary | ICD-10-CM

## 2011-08-14 ENCOUNTER — Other Ambulatory Visit: Payer: Self-pay | Admitting: Radiology

## 2011-08-14 ENCOUNTER — Other Ambulatory Visit: Payer: Self-pay | Admitting: Family Medicine

## 2011-08-17 ENCOUNTER — Other Ambulatory Visit: Payer: Self-pay | Admitting: Family Medicine

## 2011-08-18 ENCOUNTER — Other Ambulatory Visit: Payer: Self-pay | Admitting: Radiology

## 2011-08-19 ENCOUNTER — Ambulatory Visit: Payer: Medicare PPO

## 2011-08-21 ENCOUNTER — Ambulatory Visit (HOSPITAL_COMMUNITY)
Admission: RE | Admit: 2011-08-21 | Discharge: 2011-08-21 | Disposition: A | Discharge status: Home / Self Care | Payer: Medicare PPO | Source: Ambulatory Visit | Attending: Family Medicine | Admitting: Family Medicine

## 2011-08-21 ENCOUNTER — Encounter (HOSPITAL_COMMUNITY): Payer: Self-pay

## 2011-08-21 ENCOUNTER — Telehealth (INDEPENDENT_AMBULATORY_CARE_PROVIDER_SITE_OTHER): Payer: Self-pay | Admitting: General Surgery

## 2011-08-21 ENCOUNTER — Other Ambulatory Visit: Payer: Self-pay | Admitting: Family Medicine

## 2011-08-21 DIAGNOSIS — I10 Essential (primary) hypertension: Secondary | ICD-10-CM | POA: Insufficient documentation

## 2011-08-21 DIAGNOSIS — Z538 Procedure and treatment not carried out for other reasons: Secondary | ICD-10-CM | POA: Insufficient documentation

## 2011-08-21 DIAGNOSIS — R935 Abnormal findings on diagnostic imaging of other abdominal regions, including retroperitoneum: Secondary | ICD-10-CM

## 2011-08-21 DIAGNOSIS — R161 Splenomegaly, not elsewhere classified: Secondary | ICD-10-CM

## 2011-08-21 DIAGNOSIS — R109 Unspecified abdominal pain: Secondary | ICD-10-CM | POA: Insufficient documentation

## 2011-08-21 HISTORY — DX: Splenomegaly, not elsewhere classified: R16.1

## 2011-08-21 LAB — CBC
HCT: 40.4 % (ref 36.0–46.0)
MCH: 29.8 pg (ref 26.0–34.0)
MCHC: 34.4 g/dL (ref 30.0–36.0)
MCV: 86.7 fL (ref 78.0–100.0)
RDW: 15.1 % (ref 11.5–15.5)

## 2011-08-21 MED ORDER — FENTANYL CITRATE 0.05 MG/ML IJ SOLN
INTRAMUSCULAR | Status: AC
  Filled 2011-08-21: qty 4

## 2011-08-21 MED ORDER — SODIUM CHLORIDE 0.9 % IV SOLN
Freq: Once | INTRAVENOUS | Status: AC
  Administered 2011-08-21: 1000 mL via INTRAVENOUS

## 2011-08-21 MED ORDER — MIDAZOLAM HCL 2 MG/2ML IJ SOLN
INTRAMUSCULAR | Status: AC
  Filled 2011-08-21: qty 4

## 2011-08-21 NOTE — Telephone Encounter (Signed)
Shirlee Limerick, referral coordinator at [facility], calling to set up NP appt.  Pt had CT today to identify splenic mass.  Dr. Lowella Dandy discussed her case with Dr. Janee Morn.  Needs splenectomy vs splenic bx.  First available appt 09/23/11 at 9:20.  Please try to move this sooner.  Mar[facility]'s direct number is [phone].

## 2011-08-21 NOTE — H&P (Signed)
Angela Ali is an 78 y.o. female.    Chief Complaint: Abdominal pain x few months Abnormal MRI shows splenic lesions Scheduled today for splenic lesion biopsy HPI: Afib; CHF  Past Medical History  Diagnosis Date  . Overweight   . Secondary cardiomyopathy, unspecified   . Atrial flutter   . Congestive heart failure, unspecified   . Edema   . Other and unspecified hyperlipidemia   . Unspecified essential hypertension   . Cough   . Allergic rhinitis, cause unspecified   . Acute upper respiratory infections of unspecified site   . Other dysphagia   . Benign neoplasm of colon   . Diverticulosis of colon (without mention of hemorrhage)   . Benign neoplasm of other and unspecified site of the digestive system   . Other malaise and fatigue   . Stricture and stenosis of esophagus   . Bacterial pneumonia, unspecified   . Cough   . Other abnormal glucose   . Esophageal reflux   . Osteoarthrosis, unspecified whether generalized or localized, unspecified site   . Pain in joint, lower leg   . Displacement of lumbar intervertebral disc without myelopathy   . Diaphragmatic hernia without mention of obstruction or gangrene   . Encounter for long-term (current) use of anticoagulants   . Atrial fibrillation   . Prediabetes   . Chronic knee pain     Right    Past Surgical History  Procedure Date  . Esophageal dilation 01/2007  . Abdominal hysterectomy 1971    partial  . Ankle surgery 2001  . Cholecystectomy 2004    Family History  Problem Relation Age of Onset  . Emphysema Father 67  . Coronary artery disease Mother 53  . Hypertension Mother 47  . Hyperlipidemia Sister     # 1  . Hyperlipidemia Sister     # 2  . Diabetes Daughter     # 1  . Hypertension Daughter   . Diabetes Daughter     # 2   . Hypertension Daughter   . Ovarian cancer Daughter   . Cancer Son     bone marrow ca   Social History:  reports that she has never smoked. She does not have any smokeless  tobacco history on file. She reports that she does not drink alcohol or use illicit drugs.  Allergies:  Allergies  Allergen Reactions  . Percocet (Oxycodone-Acetaminophen) Nausea Only       . Tramadol Nausea Only     (Not in a hospital admission)  Results for orders placed during the hospital encounter of 08/21/11 (from the past 48 hour(s))  APTT     Status: Abnormal   Collection Time   08/21/11  7:53 AM      Component Value Range Comment   aPTT 40 (*) 24 - 37 (seconds)   CBC     Status: Normal   Collection Time   08/21/11  7:53 AM      Component Value Range Comment   WBC 10.2  4.0 - 10.5 (K/uL)    RBC 4.66  3.87 - 5.11 (MIL/uL)    Hemoglobin 13.9  12.0 - 15.0 (g/dL)    HCT 09.8  11.9 - 14.7 (%)    MCV 86.7  78.0 - 100.0 (fL)    MCH 29.8  26.0 - 34.0 (pg)    MCHC 34.4  30.0 - 36.0 (g/dL)    RDW 82.9  56.2 - 13.0 (%)    Platelets 206  150 -  400 (K/uL)   PROTIME-INR     Status: Normal   Collection Time   08/21/11  7:53 AM      Component Value Range Comment   Prothrombin Time 15.1  11.6 - 15.2 (seconds)    INR 1.17  0.00 - 1.49     No results found.  Review of Systems  Constitutional: Negative for fever.  Eyes: Negative for blurred vision.  Respiratory: Negative for cough.   Cardiovascular: Negative for chest pain.  Gastrointestinal: Positive for abdominal pain. Negative for nausea and vomiting.  Musculoskeletal: Positive for back pain.  Neurological: Negative for headaches.    Pulse 76, temperature 97 F (36.1 C), temperature source Oral, resp. rate 18, height 5\' 5"  (1.651 m), weight 182 lb (82.555 kg), SpO2 97.00%. Physical Exam  Constitutional: She is oriented to person, place, and time. She appears well-developed and well-nourished.  Cardiovascular: Normal heart sounds.   No murmur heard.      Irregular HR  Respiratory: Effort normal and breath sounds normal.  GI: Soft. Bowel sounds are normal. There is tenderness.  Musculoskeletal: Normal range of motion.         Slow gait  Neurological: She is alert and oriented to person, place, and time.  Skin: Skin is warm.  Psychiatric: She has a normal mood and affect. Her behavior is normal. Judgment and thought content normal.     Assessment/Plan Abd pain; abn MRI shows splenic lesions Scheduled now for splenic lesion biopsy Pt and family aware of procedure benefits and risks Bleeding risk discussed with pt Agreeable to proceed Consent signed. Note: BP: 183/102 this am..the patient has just now taken her meds  Addendum: Discussed the procedure in depth with the patient and family.  Explained the risk of bleeding, particularly due to her hypertension.  Briefly discussed the case with Dr. Violeta Gelinas of Surgery.  Feel that the patient should have a Surgery consult for possible splenectomy prior to performing a biopsy.  Discussed situation with her referring physician, Dr. Patsy Lager.  We will restart coumadin and Dr. Patsy Lager will set up a Surgery consult.  Biopsy has been cancelled.  Richarda Overlie, MD  TURPIN,PAMELA A 08/21/2011, 8:22 AM

## 2011-08-31 ENCOUNTER — Encounter (INDEPENDENT_AMBULATORY_CARE_PROVIDER_SITE_OTHER): Payer: Self-pay | Admitting: General Surgery

## 2011-08-31 ENCOUNTER — Ambulatory Visit (INDEPENDENT_AMBULATORY_CARE_PROVIDER_SITE_OTHER): Payer: 59 | Admitting: General Surgery

## 2011-08-31 VITALS — BP 161/103 | HR 82 | Temp 98.6°F | Resp 18 | Ht 65.0 in | Wt 186.2 lb

## 2011-08-31 DIAGNOSIS — D739 Disease of spleen, unspecified: Secondary | ICD-10-CM

## 2011-08-31 NOTE — Progress Notes (Signed)
Patient ID: Angela Ali, female   DOB: 06/23/1928, 78 y.o.   MRN: [id number]  Chief Complaint  Patient presents with  . Mass    splenic mass    HPI Angela Ali is a 78 y.o. female.   HPIPatient had some right-sided abdominal pain. This was evaluated further with lab work and CT scan of the abdomen and pelvis. CT scan of the abdomen and pelvis demonstrated multiple masses in her spleen. This was followed with MR. This region demonstrated multiple splenic masses. These are consistent with lymphoma versus granulomatous disease. There is also an indistinct mass in the liver as well as 2 benign-appearing liver cysts. She was initially scheduled for percutaneous biopsy of her spleen by interventional radiology. They contacted me as they felt it was too risky to do the spleen biopsy. The patient has no abdominal pain any longer. She has been sleeping well. She is eating and moving her bowels regularly. She has no acute complaints.  Past Medical History  Diagnosis Date  . Overweight   . Secondary cardiomyopathy, unspecified   . Atrial flutter   . Congestive heart failure, unspecified   . Edema   . Other and unspecified hyperlipidemia   . Unspecified essential hypertension   . Cough   . Allergic rhinitis, cause unspecified   . Acute upper respiratory infections of unspecified site   . Other dysphagia   . Benign neoplasm of colon   . Diverticulosis of colon (without mention of hemorrhage)   . Benign neoplasm of other and unspecified site of the digestive system   . Other malaise and fatigue   . Stricture and stenosis of esophagus   . Bacterial pneumonia, unspecified   . Cough   . Other abnormal glucose   . Esophageal reflux   . Osteoarthrosis, unspecified whether generalized or localized, unspecified site   . Pain in joint, lower leg   . Displacement of lumbar intervertebral disc without myelopathy   . Diaphragmatic hernia without mention of obstruction or gangrene   . Encounter  for long-term (current) use of anticoagulants   . Atrial fibrillation   . Prediabetes   . Chronic knee pain     Right    Past Surgical History  Procedure Date  . Esophageal dilation 01/2007  . Abdominal hysterectomy 1971    partial  . Ankle surgery 2001  . Cholecystectomy 2004    Family History  Problem Relation Age of Onset  . Emphysema Father 76  . Coronary artery disease Mother 59  . Hypertension Mother 27  . Hyperlipidemia Sister     # 1  . Hyperlipidemia Sister     # 2  . Diabetes Daughter     # 1  . Hypertension Daughter   . Diabetes Daughter     # 2   . Hypertension Daughter   . Ovarian cancer Daughter   . Cancer Son     bone marrow ca    Social History History  Substance Use Topics  . Smoking status: Never Smoker   . Smokeless tobacco: Not on file  . Alcohol Use: No    Allergies  Allergen Reactions  . Percocet (Oxycodone-Acetaminophen) Nausea Only       . Tramadol Nausea Only    Current Outpatient Prescriptions  Medication Sig Dispense Refill  . Ascorbic Acid (VITAMIN C PO) Take 1 tablet by mouth daily.      Marland Kitchen atorvastatin (LIPITOR) 80 MG tablet Take 80 mg by mouth daily.      Marland Kitchen  benzonatate (TESSALON) 100 MG capsule TAKE 2 CAPSULES (200 MG TOTAL) BY MOUTH 3 (THREE) TIMES DAILY AS NEEDED FOR COUGH.  90 capsule  0  . Cholecalciferol (VITAMIN D-3 PO) Take 1 tablet by mouth daily.      . cloNIDine (CATAPRES) 0.2 MG tablet Take 0.2 mg by mouth daily.      . digoxin (LANOXIN) 0.125 MG tablet TAKE 1 TABLET DAILY  90 tablet  3  . diltiazem (CARDIZEM) 30 MG tablet Take 30 mg by mouth 4 (four) times daily as needed. For fast heart beat      . furosemide (LASIX) 80 MG tablet Take 80 mg by mouth daily as needed. For excessive fluid      . lisinopril (PRINIVIL,ZESTRIL) 40 MG tablet TAKE 1 TABLET DAILY  90 tablet  3  . metoprolol (TOPROL-XL) 200 MG 24 hr tablet Take 200 mg by mouth daily.      Marland Kitchen omeprazole (PRILOSEC) 20 MG capsule TAKE 1 CAPSULE DAILY  90  capsule  3  . warfarin (COUMADIN) 5 MG tablet TAKE 1 TABLET DAILY AS DIRECTED  90 tablet  3  . DISCONTD: esomeprazole (NEXIUM) 40 MG capsule Take 40 mg by mouth daily.          Review of Systems Review of Systems  Constitutional: Negative for fever, chills and unexpected weight change.  HENT: Negative for hearing loss, congestion, sore throat, trouble swallowing and voice change.   Eyes: Negative for visual disturbance.  Respiratory: Negative for cough and wheezing.   Cardiovascular: Positive for leg swelling. Negative for chest pain and palpitations.  Gastrointestinal: Negative for nausea, vomiting, abdominal pain, diarrhea, constipation, blood in stool, abdominal distention and anal bleeding.  Genitourinary: Negative for hematuria, vaginal bleeding and difficulty urinating.  Musculoskeletal: Negative for arthralgias.  Skin: Negative for rash and wound.  Neurological: Positive for dizziness. Negative for seizures, syncope and headaches.  Hematological: Negative for adenopathy. Does not bruise/bleed easily.  Psychiatric/Behavioral: Negative for confusion.    Blood pressure 161/103, pulse 82, temperature 98.6 F (37 C), temperature source Temporal, resp. rate 18, height 5\' 5"  (1.651 m), weight 186 lb 3.2 oz (84.46 kg).  Physical Exam Physical Exam  Constitutional: She is oriented to person, place, and time. She appears well-developed. No distress.  HENT:  Head: Normocephalic.  Mouth/Throat: No oropharyngeal exudate.  Eyes: EOM are normal. Pupils are equal, round, and reactive to light. No scleral icterus.  Neck: Neck supple. No tracheal deviation present.  Cardiovascular:       Irregularly irregular Mild pitting edema bilateral lower extremity  Pulmonary/Chest: Effort normal and breath sounds normal. No respiratory distress. She has no wheezes. She has no rales.  Abdominal: Soft. Bowel sounds are normal. She exhibits no distension and no mass. There is no tenderness. There is no  rebound and no guarding.  Musculoskeletal: Normal range of motion.       Gait slow  Neurological: She is alert and oriented to person, place, and time.       Moves all extremities well but gait, as above is slow Some dizziness with change in positio  Skin: Skin is warm and dry.    Data Reviewed CT and MR  Assessment    Multiple splenic lesions consistent with lymphoma versus granulomatous disease    Plan    The patient notes that she had a CT scan in 2008 done in [place]. She and her granddaughter report splenic abnormalities were seen at that time. I will speak with the patient's  primary care physician Dr. Ermalene Searing tomorrow. If that scan report is available, it would be helpful. At this point I feel the patient would not benefit from splenectomy. There is significant risk associated with it and light of her age and comorbidities. Should the pathology confirmed lymphoma, I am very uncertain the patient would be given chemotherapy for the same reasons. I discussed things in detail with her primary care physician. If further workup is desired, evaluation by hematology/oncology is another option. I will speak with the patient again after discussing things with Dr. Ermalene Searing.       Emori Kamau E 08/31/2011, 3:58 PM

## 2011-08-31 NOTE — Patient Instructions (Signed)
I will call you after I speak with Dr. Ermalene Searing

## 2011-09-01 ENCOUNTER — Telehealth: Payer: Self-pay

## 2011-09-01 ENCOUNTER — Telehealth: Payer: Self-pay | Admitting: General Surgery

## 2011-09-01 NOTE — Telephone Encounter (Signed)
Pt left v/m requesting call back from [place] at [phone]. I tried calling [phone] with message wireless customer not available and no v/m option.

## 2011-09-01 NOTE — Telephone Encounter (Signed)
Dr Ermalene Searing addressed

## 2011-09-01 NOTE — Telephone Encounter (Signed)
I called Dr. Kerby Nora, the patient's primary care physician, and discussed the findings on the patient's CAT scan. Multiple splenic lesions either represent lymphoma or granulomatous disease. In light of the patient's age and medical comorbidities, Dr. Ermalene Searing and I agree it is extremely unlikely she would receive chemotherapy should the pathology show lymphoma. The patient is currently asymptomatic so there does not seem to be any reason to put her through a splenectomy at this time. I called the patient and reached her via her granddaughter. I spoke with both of them on the phone and explained the situation. Dr. Ermalene Searing is going to try and find the CT scan report from [facility] in the patient's paper chart. She will fax that to our office and if this leads to any change in plan, I will call the patient back. I answered their questions.

## 2011-09-01 NOTE — Telephone Encounter (Signed)
Tried to reach patient and got same message

## 2011-09-03 ENCOUNTER — Other Ambulatory Visit: Payer: Self-pay | Admitting: *Deleted

## 2011-09-03 MED ORDER — LISINOPRIL 40 MG PO TABS: 40.0000 mg | ORAL_TABLET | Freq: Every day | ORAL | Status: DC

## 2011-09-04 ENCOUNTER — Encounter: Payer: Self-pay | Admitting: Family Medicine

## 2011-09-04 ENCOUNTER — Ambulatory Visit (INDEPENDENT_AMBULATORY_CARE_PROVIDER_SITE_OTHER): Payer: Medicare PPO | Admitting: Family Medicine

## 2011-09-04 VITALS — BP 100/70 | HR 73 | Temp 97.8°F | Ht 65.0 in | Wt 183.1 lb

## 2011-09-04 DIAGNOSIS — Z5181 Encounter for therapeutic drug level monitoring: Secondary | ICD-10-CM

## 2011-09-04 DIAGNOSIS — Z7902 Long term (current) use of antithrombotics/antiplatelets: Secondary | ICD-10-CM

## 2011-09-04 DIAGNOSIS — I4891 Unspecified atrial fibrillation: Secondary | ICD-10-CM

## 2011-09-04 DIAGNOSIS — R161 Splenomegaly, not elsewhere classified: Secondary | ICD-10-CM

## 2011-09-04 DIAGNOSIS — Z452 Encounter for adjustment and management of vascular access device: Secondary | ICD-10-CM

## 2011-09-04 NOTE — Progress Notes (Signed)
Subjective:    Patient ID: Angela Ali, female    DOB: [birth year], 78 y.o.   MRN: [id number]  HPI 78 y.o. Female patient of mine seen by Dr. Patsy Lager for  some right-sided abdominal pain. This was evaluated further with lab work and CT scan of the abdomen and pelvis. CT scan of the abdomen and pelvis demonstrated multiple masses in her spleen. This was followed with MR. This region demonstrated multiple splenic masses. These are consistent with lymphoma versus granulomatous disease. There is also an indistinct mass in the liver as well as 2 benign-appearing liver cysts. She was initially scheduled for percutaneous biopsy of her spleen by interventional radiology. They contacted surgeon as they felt it was too risky to do the spleen biopsy. Saw Dr. Janee Morn gen surg on 08/31/2011. He felt surgery was also high risk.  Old study found in records from [facility], 09/15/2006 abd CT with contrast: showed multiple intersplenic masses that were slightly larger than when compared to 2006. Also several low density lesions unchanged in liver at that time as well. Hard copy of imaging not present, but it would likely be helpful to try to obtain this to compare studies side by side.   Pt and daughter with her today would like to know more about what her options are. They would like to know if  other procedures are options for her to find out whether this is a malignant process.  The patient has no abdominal pain any longer. She has been sleeping well. She is eating and moving her bowels regularly. She has no acute complaints  No fever, no unexpected weight loss. Has had some but eating a lot less.  Review of Systems  Constitutional: Positive for fatigue. Negative for fever and chills.  HENT: Negative for ear pain.   Eyes: Negative for pain.  Respiratory: Positive for shortness of breath. Negative for cough.   Cardiovascular: Negative for chest pain.  Gastrointestinal: Negative for abdominal pain, diarrhea,  constipation and blood in stool.  Genitourinary: Negative for dysuria.       Objective:   Physical Exam  Constitutional: Vital signs are normal. She appears well-developed and well-nourished. She is cooperative.  Non-toxic appearance. She does not appear ill. No distress.       Obese appearing in NAD  HENT:  Head: Normocephalic.  Right Ear: Hearing, tympanic membrane, external ear and ear canal normal. Tympanic membrane is not erythematous, not retracted and not bulging.  Left Ear: Hearing, tympanic membrane, external ear and ear canal normal. Tympanic membrane is not erythematous, not retracted and not bulging.  Nose: No mucosal edema or rhinorrhea. Right sinus exhibits no maxillary sinus tenderness and no frontal sinus tenderness. Left sinus exhibits no maxillary sinus tenderness and no frontal sinus tenderness.  Mouth/Throat: Uvula is midline, oropharynx is clear and moist and mucous membranes are normal.  Eyes: Conjunctivae, EOM and lids are normal. Pupils are equal, round, and reactive to light. No foreign bodies found.  Neck: Trachea normal and normal range of motion. Neck supple. Carotid bruit is not present. No mass and no thyromegaly present.  Cardiovascular: Normal rate, regular rhythm, S1 normal, S2 normal, normal heart sounds, intact distal pulses and normal pulses.  Exam reveals no gallop and no friction rub.   No murmur heard. Pulmonary/Chest: Effort normal and breath sounds normal. Not tachypneic. No respiratory distress. She has no decreased breath sounds. She has no wheezes. She has no rhonchi. She has no rales.  Abdominal: Soft. Normal appearance and  bowel sounds are normal. There is no tenderness.  Neurological: She is alert.  Skin: Skin is warm, dry and intact. No rash noted.  Psychiatric: She has a normal mood and affect. Her speech is normal and behavior is normal. Judgment and thought content normal. Her mood appears not anxious. Cognition and memory are normal. She does  not exhibit a depressed mood.       Appropriate mood given today's discussion.          Assessment & Plan:

## 2011-09-04 NOTE — Patient Instructions (Signed)
Continue current dose, check in 4 weeks  

## 2011-09-04 NOTE — Patient Instructions (Signed)
Stop at front desk to make appt to see oncology.

## 2011-09-04 NOTE — Assessment & Plan Note (Addendum)
Concerning for lymphoma. Total visit time 30 minutes, > 50% spent counseling and cordinating patients care. Discussed findings in detail with pt , answered questions. Discussed that she is not great biopsy or surgery candidate. Daughter and pt wish to find out more about what lesions in spleen are if possible, but safely.  Reviewed 2008 studies... Spleen masses appear approximately same size 2.1 X 2.9, 2.0 x 3.2 etc. Will try to get hard copies to have radiology compare if possible.  Will refer to oncology to discuss whether further imaging such at PET scan,  bone marrow biopsy are safer options than percutaneous biopsy of the area.   Pt without pain, but does have chronic fatigue that may be connected to issue.

## 2011-09-16 ENCOUNTER — Telehealth: Payer: Self-pay | Admitting: *Deleted

## 2011-09-16 NOTE — Telephone Encounter (Signed)
In triaging and workup for referral of Splenic Mass, there has not been a biopsy on this patient yet due to high risk of procedure. Surgeon states that patient also is not a surgical candidate.  Family and patient would like consult with Oncology to determine the chances/risk of this being a malignancy before proceeding with further testing.  Note splenic masses have been noted on scans since 2006. Orders written to schedule New Patient Appt.

## 2011-09-17 ENCOUNTER — Other Ambulatory Visit: Payer: Self-pay | Admitting: Oncology

## 2011-09-17 ENCOUNTER — Telehealth: Payer: Self-pay | Admitting: Oncology

## 2011-09-17 ENCOUNTER — Encounter: Payer: Self-pay | Admitting: Oncology

## 2011-09-17 DIAGNOSIS — R161 Splenomegaly, not elsewhere classified: Secondary | ICD-10-CM

## 2011-09-17 NOTE — Telephone Encounter (Signed)
correction, sent letter to Dr. Katrinka Blazing

## 2011-09-17 NOTE — Telephone Encounter (Signed)
pt rtn call and scheduled appt for 06/05.  will send a letter to Dr. Ermalene Searing with appt d/t

## 2011-09-17 NOTE — Telephone Encounter (Signed)
Referred by Dr. Ermalene Searing Dx- Splenic Mass

## 2011-09-17 NOTE — Telephone Encounter (Signed)
called pt to scheduled appt and pt stated that she will rtn call once she speaks  to grandaughter to see what day she can come in for appt

## 2011-09-18 ENCOUNTER — Telehealth: Payer: Self-pay | Admitting: Oncology

## 2011-09-18 NOTE — Telephone Encounter (Signed)
called pt and scheduled appt for 06/05.  will send a letter to Dr. Ermalene Searing with appt d/t

## 2011-09-23 ENCOUNTER — Telehealth: Payer: Self-pay

## 2011-09-23 ENCOUNTER — Ambulatory Visit (INDEPENDENT_AMBULATORY_CARE_PROVIDER_SITE_OTHER): Payer: Self-pay | Admitting: General Surgery

## 2011-09-23 NOTE — Telephone Encounter (Signed)
Pt request changing Atorvastatin to Simvastatin; pt not having any symptoms but has heard negative news on TV for Atorvastatin and pts daughter takes Simvastatin. [facility] [address] Pt wants status of consent letter for diabetic shoes sent from [facility].

## 2011-09-24 NOTE — Telephone Encounter (Signed)
She was on simvastatin in past but it was not effective for her... I do not think atorvastatin is any less safe. I believe I completed diabetic shoe papers.. Not sure current status.

## 2011-09-25 NOTE — Telephone Encounter (Signed)
Advised patient, advised her form for diabetic supplies was faxed in on 09/23/11.

## 2011-09-30 ENCOUNTER — Ambulatory Visit: Payer: Medicare PPO

## 2011-09-30 ENCOUNTER — Other Ambulatory Visit (HOSPITAL_BASED_OUTPATIENT_CLINIC_OR_DEPARTMENT_OTHER): Payer: Medicare PPO | Admitting: Lab

## 2011-09-30 ENCOUNTER — Encounter: Payer: Self-pay | Admitting: Oncology

## 2011-09-30 ENCOUNTER — Ambulatory Visit (HOSPITAL_BASED_OUTPATIENT_CLINIC_OR_DEPARTMENT_OTHER): Payer: Medicare PPO | Admitting: Oncology

## 2011-09-30 VITALS — BP 191/111 | HR 82 | Temp 97.4°F | Ht 62.5 in | Wt 181.1 lb

## 2011-09-30 DIAGNOSIS — R161 Splenomegaly, not elsewhere classified: Secondary | ICD-10-CM

## 2011-09-30 DIAGNOSIS — R1011 Right upper quadrant pain: Secondary | ICD-10-CM

## 2011-09-30 DIAGNOSIS — D739 Disease of spleen, unspecified: Secondary | ICD-10-CM

## 2011-09-30 LAB — CBC WITH DIFFERENTIAL/PLATELET
BASO%: 0.2 % (ref 0.0–2.0)
EOS%: 2.7 % (ref 0.0–7.0)
HCT: 40.1 % (ref 34.8–46.6)
LYMPH%: 20.6 % (ref 14.0–49.7)
MCH: 29 pg (ref 25.1–34.0)
MCHC: 34.2 g/dL (ref 31.5–36.0)
MCV: 85 fL (ref 79.5–101.0)
NEUT%: 69.1 % (ref 38.4–76.8)
Platelets: 161 10*3/uL (ref 145–400)

## 2011-09-30 LAB — ANGIOTENSIN CONVERTING ENZYME: Angiotensin 1 CE: 2 U/L — ABNORMAL LOW (ref 8–52)

## 2011-09-30 LAB — MORPHOLOGY

## 2011-09-30 NOTE — Patient Instructions (Signed)
A.  Issues:  Splenic lesions since at least 2006.  B.  Result of latest imaging MRI abdomen in 07/2011.  This MRI is correlated with the CT performed 08/06/2011. Numerous  splenic lesions were demonstrated on both of the prior outside CTs.  Although the number of lesions and the extent of involvement appear  progressive since 2006, the dominant lesions have not significantly  changed. The overall splenic volume is similar. This favors a  benign process such as granulomatous disease, multiple hemangiomas  or hamartomas.  The low density liver lesions are unchanged. The lesion inferiorly  in the right hepatic lobe seen on MRI is unchanged from the 2006  CT. A small right adrenal nodule is stable. The patient does  appear to be status post left adrenalectomy for a nodule  demonstrated on the 2006 examination.  C.  Possibilities:  Benign conditions (such as splenic harmatomas which were benign blood vessels growth or granulomatous disease which is normally benign inflammation).  I have low suspicion for chronic infection since patients are normally much sicker.  Less likely due to metastatic disease spreading to spleen since isolating spread of cancer to spleen is very rare.  I cannot rule out lymphoma; however, if you were to have lymphoma, it is most likely low grade.  Low grade lymphoma does not need to be treated all the time.  Indication for treatment of low grade lymphoma include:  Severe constitutional symptoms (weight loss, night sweat), very large lymph nodes; severe decrease in blood count.    D.  Recommendation:  Watchful observation.  If you have severe, persistent abdominal pain in the future, then further imaging or biopsy may be considered.

## 2011-09-30 NOTE — Progress Notes (Signed)
Angela Ali  Telephone:([phone]) [phone] [phone]     INITIAL HEMATOLOGY CONSULTATION    Referral MD:  Dr. Excell Seltzer, M.D.  Reason for Referral:  Splenic lesions.   HPI:  [name] is an 78 year-old [place] American woman with many co morbidities but with relatively good performance status. She was in usual state of health until 2006 when she developed intermittent RUQ abdominal pain.  An abdominal CT was obtained for outside facility in [place], [place] which showed no pathology in the RUQ.  However, she was found to have incidental innumerable splenic lesions.  She recently moved to [place], [place] to be with her family.  Dr. Ermalene Searing referred her to CT-guided splenic lesions; however, IR declined the splenic biopsy due to concern for hemorrhage.  She was referred to Dr. Janee Morn from General Surgery who did not think that splenectomy was warranted to her medical co morbidities.  She was thus kindly referred to the [facility] for [facility].  [name] presented to the clinic for the first time today with her daughter and grand daughter.  She reports feeling relatively well.  She tripped and fell last year and sustained a hematoma and ulcer.  She has normal appetite now.  She used to have weight loss due to the hematoma and leg ulcer.  Her weight loss has ceased.  She denied any LUQ pain.  At times, she has pelvic cramp but very mild and spontaneously resolves without intervention.   Patient denies fatigue, headache, visual changes, confusion, drenching night sweats, palpable lymph node swelling, mucositis, odynophagia, dysphagia, nausea vomiting, jaundice, chest pain, palpitation, shortness of breath, dyspnea on exertion, productive cough, gum bleeding, epistaxis, hematemesis, hemoptysis, abdominal swelling, early satiety, melena, hematochezia, hematuria, skin rash, spontaneous bleeding, joint swelling, joint pain, heat or cold intolerance, bowel bladder  incontinence, back pain, focal motor weakness, paresthesia, depression, suicidal or homocidal ideation, feeling hopelessness.   Past Medical History  Diagnosis Date  . Overweight   . Secondary cardiomyopathy, unspecified   . Atrial flutter   . Congestive heart failure, unspecified   . Edema   . Other and unspecified hyperlipidemia   . Unspecified essential hypertension   . Allergic rhinitis, cause unspecified   . Colonic polyp   . Diverticulosis of colon (without mention of hemorrhage)   . Other malaise and fatigue   . Stricture and stenosis of esophagus   . Bacterial pneumonia, unspecified   . Other abnormal glucose   . Esophageal reflux   . Osteoarthrosis, unspecified whether generalized or localized, unspecified site   . Displacement of lumbar intervertebral disc without myelopathy   . Diaphragmatic hernia without mention of obstruction or gangrene   . Encounter for long-term (current) use of anticoagulants   . Atrial fibrillation   . Prediabetes   . Splenic mass 08/21/2011  :    Past Surgical History  Procedure Date  . Esophageal dilation 01/2007  . Abdominal hysterectomy 1971    partial  . Ankle surgery 2001  . Cholecystectomy 2004  :   CURRENT MEDS: Current Outpatient Prescriptions  Medication Sig Dispense Refill  . Ascorbic Acid (VITAMIN C PO) Take 1 tablet by mouth daily.      Marland Kitchen atorvastatin (LIPITOR) 80 MG tablet Take 80 mg by mouth daily.      . benzonatate (TESSALON) 100 MG capsule TAKE 2 CAPSULES (200 MG TOTAL) BY MOUTH 3 (THREE) TIMES DAILY AS NEEDED FOR COUGH.  90 capsule  0  .  Cholecalciferol (VITAMIN D-3 PO) Take 1 tablet by mouth daily.      . cloNIDine (CATAPRES) 0.2 MG tablet Take 0.2 mg by mouth 2 (two) times daily.       . digoxin (LANOXIN) 0.125 MG tablet TAKE 1 TABLET DAILY  90 tablet  3  . diltiazem (CARDIZEM) 30 MG tablet Take 30 mg by mouth 4 (four) times daily as needed. For fast heart beat      . furosemide (LASIX) 80 MG tablet Take 80 mg  by mouth daily as needed. For excessive fluid      . lisinopril (PRINIVIL,ZESTRIL) 40 MG tablet Take 1 tablet (40 mg total) by mouth daily.  30 tablet  0  . metoprolol (TOPROL-XL) 200 MG 24 hr tablet Take 200 mg by mouth daily.      Marland Kitchen omeprazole (PRILOSEC) 20 MG capsule TAKE 1 CAPSULE DAILY  90 capsule  3  . warfarin (COUMADIN) 5 MG tablet TAKE 1 TABLET DAILY AS DIRECTED  90 tablet  3  . DISCONTD: esomeprazole (NEXIUM) 40 MG capsule Take 40 mg by mouth daily.            Allergies  Allergen Reactions  . Percocet (Oxycodone-Acetaminophen) Nausea Only       . Tramadol Nausea Only  :  Family History  Problem Relation Age of Onset  . Emphysema Father 82  . Coronary artery disease Mother 68  . Hypertension Mother 68  . Hyperlipidemia Sister     # 1  . Hyperlipidemia Sister     # 2  . Diabetes Daughter     # 1  . Hypertension Daughter   . Diabetes Daughter     # 2   . Hypertension Daughter   . Ovarian cancer Daughter   . Cancer Son     bone marrow ca  :  History   Social History  . Marital Status: Widowed    Spouse Name: N/A    Number of Children: 5  . Years of Education: N/A   Occupational History  . Retired Runner, broadcasting/film/video     retired Runner, broadcasting/film/video.  Used to live in [place], [place], now in [place] with her family   Social History Main Topics  . Smoking status: Never Smoker   . Smokeless tobacco: Never Used  . Alcohol Use: No  . Drug Use: No  . Sexually Active: No   Other Topics Concern  . Not on file   Social History Narrative   Lost spouse to cancer in 4/20085 childrenDoes not get regular exercise  :  REVIEW OF SYSTEM:  The rest of the 14-point review of sytem was negative.   Exam: ECOG 0-1  General:  well-nourished woman, in no acute distress.  Eyes:  no scleral icterus.  ENT:  There were no oropharyngeal lesions.  Neck was without thyromegaly.  Lymphatics:  Negative cervical, supraclavicular or axillary adenopathy.  Respiratory: lungs were clear bilaterally  without wheezing or crackles.  Cardiovascular:  Regular rate and rhythm, S1/S2, without murmur, rub or gallop.  There was no pedal edema.  GI:  abdomen was soft, flat, nontender, nondistended, without organomegaly.  Muscoloskeletal:  no spinal tenderness of palpation of vertebral spine.  Skin exam was without echymosis, petichae.  Neuro exam was nonfocal.  Patient was able to get on and off exam table without assistance.  Gait was normal.  Patient was alerted and oriented.  Attention was good.   Language was appropriate.  Mood was normal without depression.  Speech was  not pressured.  Thought content was not tangential.    LABS:  Lab Results  Component Value Date   WBC 9.4 09/30/2011   HGB 13.7 09/30/2011   HCT 40.1 09/30/2011   PLT 161 09/30/2011   GLUCOSE 102* 08/05/2011   CHOL 147 06/10/2011   TRIG 80.0 06/10/2011   HDL 51.30 06/10/2011   LDLCALC 80 06/10/2011   ALT 15 08/05/2011   AST 14 08/05/2011   NA 142 08/05/2011   K 4.4 08/05/2011   CL 102 08/05/2011   CREATININE 0.7 08/05/2011   BUN 10 08/05/2011   CO2 31 08/05/2011   INR 2.0 09/04/2011   HGBA1C 6.3 06/10/2011   MICROALBUR 2.6* 02/26/2010   IMAGING:        **ADDENDUM** CREATED: 09/14/2011 14:58:16  Original MR report read by Dr. Reche Dixon. Addendum by Dr. Purcell Mouton.  Prior CT scans from [facility] dated 01/26/2005 and 09/15/2006 have  been obtained for comparison. The reports from the studies are not  available.  This MRI is correlated with the CT performed 08/06/2011. Numerous  splenic lesions were demonstrated on both of the prior outside CTs.  Although the number of lesions and the extent of involvement appear  progressive since 2006, the dominant lesions have not significantly  changed. The overall splenic volume is similar. This favors a  benign process such as granulomatous disease, multiple hemangiomas  or hamartomas.  The low density liver lesions are unchanged. The lesion inferiorly  in the right hepatic lobe seen on MRI is unchanged  from the 2006  CT. A small right adrenal nodule is stable. The patient does  appear to be status post left adrenalectomy for a nodule  demonstrated on the 2006 examination.     ASSESSMENT AND PLAN:   1.  Splenic lesions:    Differentials:   Benign conditions (such as splenic harmatomas which were benign blood vessels growth or sarcoidosis/granulomatous disease which is normally benign inflammation).  I have low suspicion for chronic infection since patients are normally much sicker.  It is less likely due to metastatic disease spreading to spleen since isolating spread of cancer to spleen is very rare.  She is up to date with age-appropriate cancer screening.  I cannot rule out lymphoma; however, if she were to have lymphoma, it is most likely low grade.  Low grade lymphoma does not need to be treated all the time.  Indication for treatment of low grade lymphoma include:  Severe constitutional symptoms (weight loss, night sweat), very large lymph nodes; severe decrease in blood count.  She has none of these indication at this time.  She inquired about splenectomy which I do not recommend since she is totally asymtomatic at this time. Splenectomy can predispose patient to certain infections.   Recommendation:  Watchful observation.  If she severe, persistent abdominal pain in the future, then further imaging or biopsy may be considered.   2.  Dispo:  D/C from clinic.  Our service is always available in the future if the need ever arises.    Thank you for this referral.    The length of time of the face-to-face encounter was 40 minutes. More than 50% of time was spent counseling and coordination of care.

## 2011-10-01 ENCOUNTER — Ambulatory Visit: Payer: Medicare PPO

## 2011-10-02 ENCOUNTER — Ambulatory Visit (INDEPENDENT_AMBULATORY_CARE_PROVIDER_SITE_OTHER): Payer: Medicare PPO | Admitting: Family Medicine

## 2011-10-02 ENCOUNTER — Ambulatory Visit: Payer: Medicare PPO

## 2011-10-02 DIAGNOSIS — Z7901 Long term (current) use of anticoagulants: Secondary | ICD-10-CM

## 2011-10-02 DIAGNOSIS — Z5181 Encounter for therapeutic drug level monitoring: Secondary | ICD-10-CM

## 2011-10-02 DIAGNOSIS — I4891 Unspecified atrial fibrillation: Secondary | ICD-10-CM

## 2011-10-02 DIAGNOSIS — Z452 Encounter for adjustment and management of vascular access device: Secondary | ICD-10-CM

## 2011-10-02 DIAGNOSIS — Z7902 Long term (current) use of antithrombotics/antiplatelets: Secondary | ICD-10-CM

## 2011-10-02 LAB — POCT INR: INR: 1.3

## 2011-10-02 MED ORDER — WARFARIN SODIUM 2.5 MG PO TABS: ORAL_TABLET | ORAL | Status: DC

## 2011-10-02 NOTE — Patient Instructions (Signed)
5 mg daily, 2.5 mg T/TH( increased 2.5 mg weekly) 1 week

## 2011-10-02 NOTE — Progress Notes (Signed)
Addended by: Liane Comber C on: 10/02/2011 11:31 AM   Modules accepted: Orders

## 2011-10-06 ENCOUNTER — Ambulatory Visit: Payer: Medicare PPO

## 2011-10-09 ENCOUNTER — Ambulatory Visit (INDEPENDENT_AMBULATORY_CARE_PROVIDER_SITE_OTHER): Payer: Medicare PPO | Admitting: Family Medicine

## 2011-10-09 DIAGNOSIS — Z7901 Long term (current) use of anticoagulants: Secondary | ICD-10-CM

## 2011-10-09 DIAGNOSIS — Z7902 Long term (current) use of antithrombotics/antiplatelets: Secondary | ICD-10-CM

## 2011-10-09 DIAGNOSIS — Z452 Encounter for adjustment and management of vascular access device: Secondary | ICD-10-CM

## 2011-10-09 DIAGNOSIS — I4891 Unspecified atrial fibrillation: Secondary | ICD-10-CM

## 2011-10-09 DIAGNOSIS — Z5181 Encounter for therapeutic drug level monitoring: Secondary | ICD-10-CM

## 2011-10-09 NOTE — Patient Instructions (Addendum)
5 mg daily, 2.5 mg T/TH, recheck 2 weeks

## 2011-10-15 ENCOUNTER — Encounter (INDEPENDENT_AMBULATORY_CARE_PROVIDER_SITE_OTHER): Payer: Self-pay

## 2011-10-22 ENCOUNTER — Ambulatory Visit (INDEPENDENT_AMBULATORY_CARE_PROVIDER_SITE_OTHER): Payer: Medicare PPO | Admitting: Family Medicine

## 2011-10-22 DIAGNOSIS — I4891 Unspecified atrial fibrillation: Secondary | ICD-10-CM

## 2011-10-22 DIAGNOSIS — Z7902 Long term (current) use of antithrombotics/antiplatelets: Secondary | ICD-10-CM

## 2011-10-22 DIAGNOSIS — Z452 Encounter for adjustment and management of vascular access device: Secondary | ICD-10-CM

## 2011-10-22 DIAGNOSIS — Z7901 Long term (current) use of anticoagulants: Secondary | ICD-10-CM

## 2011-10-22 NOTE — Patient Instructions (Signed)
5 mg daily, 2.5 mg SATT/TH, recheck 2 weeks(educed 2.5 mg weekly)

## 2011-10-27 ENCOUNTER — Telehealth: Payer: Self-pay

## 2011-10-27 NOTE — Telephone Encounter (Signed)
Pt has heard adverse comments about Lipitor on TV; pt not having any symptoms but would like to change to different med. Please advise. [employer].

## 2011-10-27 NOTE — Telephone Encounter (Signed)
Pt seen by wound center but still having problems with swelling and soreness in right leg; skin has covered the wound. Pt has been discharged by wound center. Pt wants Dr Daphine Deutscher opinion if should go back to wound center or make appt with Dr Ermalene Searing.Please advise.

## 2011-10-27 NOTE — Telephone Encounter (Signed)
If wound is closed she needs to be seen here for further treatment.

## 2011-10-27 NOTE — Telephone Encounter (Signed)
Patient advised and appt made also will discuss lipitor with you.

## 2011-11-03 ENCOUNTER — Encounter: Payer: Self-pay | Admitting: Family Medicine

## 2011-11-03 ENCOUNTER — Ambulatory Visit (INDEPENDENT_AMBULATORY_CARE_PROVIDER_SITE_OTHER): Payer: Medicare PPO | Admitting: Family Medicine

## 2011-11-03 VITALS — BP 130/90 | HR 68 | Temp 98.4°F | Ht 62.5 in | Wt 183.5 lb

## 2011-11-03 DIAGNOSIS — Z452 Encounter for adjustment and management of vascular access device: Secondary | ICD-10-CM

## 2011-11-03 DIAGNOSIS — S8010XA Contusion of unspecified lower leg, initial encounter: Secondary | ICD-10-CM

## 2011-11-03 DIAGNOSIS — I4891 Unspecified atrial fibrillation: Secondary | ICD-10-CM

## 2011-11-03 DIAGNOSIS — Z7902 Long term (current) use of antithrombotics/antiplatelets: Secondary | ICD-10-CM

## 2011-11-03 DIAGNOSIS — E785 Hyperlipidemia, unspecified: Secondary | ICD-10-CM

## 2011-11-03 LAB — POCT INR: INR: 2.1

## 2011-11-03 NOTE — Assessment & Plan Note (Signed)
Discussed pt concerns about lipitor. Recommended continuing  given high risk for  CVA and MI and answered questions. Pt voiced understangin and will continue as no SE to this med.

## 2011-11-03 NOTE — Patient Instructions (Signed)
Continue current dose, check in 4 weeks  

## 2011-11-03 NOTE — Progress Notes (Signed)
  Subjective:    Patient ID: Angela Ali, female    DOB: [birth year], 79 y.o.   MRN: [id number]  HPI 78 year old female presents for further evaluation of right leg pain in setting of history of right leg hematoma.This was secondary to mechanical  Fall, 06/2010 against a background of Coumadin-induced coagulopathy with an  INR of 5.9. Anticoagulation was reversed and per Orthopedic  recommendation, compressive wrap was applied around the right lower  extremity.  Treated at wound care center.Marland Kitchen Discharged now.  Complicated during course with infection of hematoma and slowed healing...treated with antibiotics.  She reports continue occasionally pain in right lateral leg, sharp shooting pain. Large area of scar. Has chronic swelling. No new redness. Difficulty driving because pain/ weakness with foot dorsiflexion. She is doing occ strengthening exercise. She never had PT.  Not taking any med except tylenol  For pain.. SE to other pain med in past.  Pt is worried about what she has seen about lipitor in news. Does not want to take if could hurt her.  Review of Systems  Constitutional: Negative for fever and fatigue.  HENT: Negative for ear pain.   Eyes: Negative for pain.  Respiratory: Negative for chest tightness and shortness of breath.   Cardiovascular: Negative for chest pain, palpitations and leg swelling.  Gastrointestinal: Negative for abdominal pain.  Genitourinary: Negative for dysuria.       Objective:   Physical Exam  Constitutional: Vital signs are normal. She appears well-developed and well-nourished. She is cooperative.  Non-toxic appearance. She does not appear ill. No distress.  HENT:  Head: Normocephalic.  Right Ear: Hearing, tympanic membrane, external ear and ear canal normal. Tympanic membrane is not erythematous, not retracted and not bulging.  Left Ear: Hearing, tympanic membrane, external ear and ear canal normal. Tympanic membrane is not erythematous, not  retracted and not bulging.  Nose: No mucosal edema or rhinorrhea. Right sinus exhibits no maxillary sinus tenderness and no frontal sinus tenderness. Left sinus exhibits no maxillary sinus tenderness and no frontal sinus tenderness.  Mouth/Throat: Uvula is midline, oropharynx is clear and moist and mucous membranes are normal.  Eyes: Conjunctivae, EOM and lids are normal. Pupils are equal, round, and reactive to light. No foreign bodies found.  Neck: Trachea normal and normal range of motion. Neck supple. Carotid bruit is not present. No mass and no thyromegaly present.  Cardiovascular: Normal rate, regular rhythm, S1 normal, S2 normal, normal heart sounds, intact distal pulses and normal pulses.  Exam reveals no gallop and no friction rub.   No murmur heard. Pulmonary/Chest: Effort normal and breath sounds normal. Not tachypneic. No respiratory distress. She has no decreased breath sounds. She has no wheezes. She has no rhonchi. She has no rales.  Abdominal: Soft. Normal appearance and bowel sounds are normal. There is no tenderness.  Neurological: She is alert.  Skin: Skin is warm, dry and intact.       Scarring , discoloration and deformity at right anterior leg over ant tibilalis muscle. Strenght 5/5 in B legs.  Psychiatric: Her speech is normal and behavior is normal. Judgment and thought content normal. Her mood appears not anxious. Cognition and memory are normal. She does not exhibit a depressed mood.          Assessment & Plan:

## 2011-11-03 NOTE — Patient Instructions (Addendum)
Start home leg strengthening exercise and physical therapy. Call [facility].. Ask them if lipitor/atorvastatin 40 mg is smaller than 80 mg.. If so we can change to 2 tablets of 40 mg daily. Or ask if you can crush the tablet and take in applesauce.

## 2011-11-03 NOTE — Assessment & Plan Note (Signed)
Discussed mechanism of injury and likely damage to vessels, muscles and nerves causing chronic swelling, numbness and weakness. Recommended home PT exercises (info given).. Can send for PT if not improving.  minimal pain.. Use tylenol for pain.

## 2011-11-05 ENCOUNTER — Ambulatory Visit: Payer: Medicare PPO

## 2011-12-01 ENCOUNTER — Ambulatory Visit (INDEPENDENT_AMBULATORY_CARE_PROVIDER_SITE_OTHER): Payer: Medicare PPO | Admitting: Family Medicine

## 2011-12-01 ENCOUNTER — Encounter: Payer: Self-pay | Admitting: Family Medicine

## 2011-12-01 VITALS — BP 140/80 | HR 70 | Temp 98.5°F | Ht 62.5 in | Wt 185.8 lb

## 2011-12-01 DIAGNOSIS — Z452 Encounter for adjustment and management of vascular access device: Secondary | ICD-10-CM

## 2011-12-01 DIAGNOSIS — Z7902 Long term (current) use of antithrombotics/antiplatelets: Secondary | ICD-10-CM

## 2011-12-01 DIAGNOSIS — K5909 Other constipation: Secondary | ICD-10-CM | POA: Insufficient documentation

## 2011-12-01 DIAGNOSIS — K5900 Constipation, unspecified: Secondary | ICD-10-CM

## 2011-12-01 DIAGNOSIS — R1030 Lower abdominal pain, unspecified: Secondary | ICD-10-CM

## 2011-12-01 DIAGNOSIS — R109 Unspecified abdominal pain: Secondary | ICD-10-CM

## 2011-12-01 DIAGNOSIS — I4891 Unspecified atrial fibrillation: Secondary | ICD-10-CM

## 2011-12-01 LAB — COMPREHENSIVE METABOLIC PANEL
Albumin: 4 g/dL (ref 3.5–5.2)
Alkaline Phosphatase: 76 U/L (ref 39–117)
BUN: 12 mg/dL (ref 6–23)
Calcium: 9.7 mg/dL (ref 8.4–10.5)
Chloride: 101 mEq/L (ref 96–112)
GFR: 87.33 mL/min (ref >60.00)
Glucose, Bld: 125 mg/dL — ABNORMAL HIGH (ref 70–99)
Potassium: 4.1 mEq/L (ref 3.5–5.1)

## 2011-12-01 LAB — CBC WITH DIFFERENTIAL/PLATELET
Basophils Relative: 0.3 % (ref 0.0–3.0)
Eosinophils Relative: 2.4 % (ref 0.0–5.0)
Lymphocytes Relative: 28.3 % (ref 12.0–46.0)
Monocytes Relative: 4.6 % (ref 3.0–12.0)
Neutrophils Relative %: 64.4 % (ref 43.0–77.0)
RBC: 4.36 Mil/uL (ref 3.87–5.11)
WBC: 8.2 10*3/uL (ref 4.5–10.5)

## 2011-12-01 LAB — POCT URINALYSIS DIPSTICK
Blood, UA: NEGATIVE
Glucose, UA: NEGATIVE
Spec Grav, UA: 1.025
Urobilinogen, UA: NEGATIVE

## 2011-12-01 NOTE — Assessment & Plan Note (Addendum)
   Simi;lar to pain per note (pt does not remember) that she was having earlier in the year that led to CT abd/pelvis. Urine clear. Not consistent with diverticulitits, no suggestion of intraabdominal abcsess.  Most likely related to constipation. Will eval with labs. Increase fluids some, mirilax daily.  If not improving will refer to her GI MD, Dr, Arlyce Dice.

## 2011-12-01 NOTE — Patient Instructions (Addendum)
Increase fluids some. Start mirilax daily. If not improving in next few days or loose stools stop mirilax. At that point we will consider referral to GI forfurther eval.

## 2011-12-01 NOTE — Patient Instructions (Signed)
Continue current dose, check in 4 weeks  

## 2011-12-01 NOTE — Progress Notes (Signed)
Subjective:    Patient ID: Angela Ali, female    DOB: [birth year], 78 y.o.   MRN: [id number]  HPI 78 year old female with complicated medical history  (DM, splenic mass)presents with intermittant lower abdominal cramping x 1 week... One episode last week and once yesterday. Lasts all day. Background ache today. Feels some better after BM...BMs regular now on stool softner, but constipation last week. No blood in stool.  10/10 on pain scale yesterday.  Last week some  pain with BM. Feels like something pushing out of rectum.  Has noted some urinary frequency.  No dysuria, no hematuria.   No N/V. No fever. Feeling well otherwise.  Hx of hemmorhoids, devrticulosis. Last colonoscopy 2009 Dr. Arlyce Dice.. Repeat due in 2014.   Earlier in year she was having similar pain: See Dr. Cyndie Chime note.  CT abdpelvis 07/2011:  1. Diffusely abnormal spleen with multiple enhancing masses.  Appearance is nonspecific with considerations including metastatic  disease, lymphoma and granulomatous disease (sarcoidosis or fungal  infection). Multiple hemangiomas and hamartomas are considered less  likely. MRI is occasionally helpful in narrowing the differential.  2. Question prior left adrenal resection. Correlate clinically.  The right adrenal gland is prominent without discrete mass.  3. Nonspecific liver lesions with one ill-defined lesion involving  the right hepatic lobe, potentially related to splenic process.  4. No other evidence of extra splenic neoplasm or lymphadenopathy.  history of cholecystectomy, hysterectomy, and removal of her adrenal gland    Review of Systems  Constitutional: Negative for fever and fatigue.  HENT: Negative for ear pain.   Eyes: Negative for pain.  Respiratory: Negative for chest tightness and shortness of breath.   Cardiovascular: Negative for chest pain, palpitations and leg swelling.  Gastrointestinal: Negative for abdominal pain.  Genitourinary: Negative  for dysuria.       Objective:   Physical Exam  Constitutional: Vital signs are normal. She appears well-developed and well-nourished. She is cooperative.  Non-toxic appearance. She does not appear ill. No distress.  HENT:  Head: Normocephalic.  Right Ear: Hearing, tympanic membrane, external ear and ear canal normal. Tympanic membrane is not erythematous, not retracted and not bulging.  Left Ear: Hearing, tympanic membrane, external ear and ear canal normal. Tympanic membrane is not erythematous, not retracted and not bulging.  Nose: No mucosal edema or rhinorrhea. Right sinus exhibits no maxillary sinus tenderness and no frontal sinus tenderness. Left sinus exhibits no maxillary sinus tenderness and no frontal sinus tenderness.  Mouth/Throat: Uvula is midline, oropharynx is clear and moist and mucous membranes are normal.  Eyes: Conjunctivae, EOM and lids are normal. Pupils are equal, round, and reactive to light. No foreign bodies found.  Neck: Trachea normal and normal range of motion. Neck supple. Carotid bruit is not present. No mass and no thyromegaly present.  Cardiovascular: Normal rate, regular rhythm, S1 normal, S2 normal, normal heart sounds, intact distal pulses and normal pulses.  Exam reveals no gallop and no friction rub.   No murmur heard. Pulmonary/Chest: Effort normal and breath sounds normal. Not tachypneic. No respiratory distress. She has no decreased breath sounds. She has no wheezes. She has no rhonchi. She has no rales.  Abdominal: Soft. Normal appearance and bowel sounds are normal. There is no hepatosplenomegaly. There is generalized tenderness. There is no rebound and no CVA tenderness.       Abdominal wall weaknening... Possible large ventral hernia  Neurological: She is alert.  Skin: Skin is warm, dry and intact. No rash  noted.  Psychiatric: Her speech is normal and behavior is normal. Judgment and thought content normal. Her mood appears not anxious. Cognition and  memory are normal. She does not exhibit a depressed mood.          Assessment & Plan:

## 2011-12-03 ENCOUNTER — Other Ambulatory Visit: Payer: Self-pay

## 2011-12-03 NOTE — Telephone Encounter (Signed)
Patient is confused on her medications. The patient will call back to verify the pharmacy she is using.

## 2011-12-12 ENCOUNTER — Other Ambulatory Visit: Payer: Self-pay | Admitting: Family Medicine

## 2011-12-22 ENCOUNTER — Other Ambulatory Visit: Payer: Self-pay | Admitting: Family Medicine

## 2011-12-30 ENCOUNTER — Ambulatory Visit (INDEPENDENT_AMBULATORY_CARE_PROVIDER_SITE_OTHER): Payer: 59 | Admitting: Family Medicine

## 2011-12-30 DIAGNOSIS — Z452 Encounter for adjustment and management of vascular access device: Secondary | ICD-10-CM

## 2011-12-30 DIAGNOSIS — I4891 Unspecified atrial fibrillation: Secondary | ICD-10-CM

## 2011-12-30 DIAGNOSIS — Z7902 Long term (current) use of antithrombotics/antiplatelets: Secondary | ICD-10-CM

## 2011-12-30 LAB — POCT INR: INR: 1.7

## 2011-12-30 NOTE — Patient Instructions (Signed)
Continue current dose, check in 1weeks  

## 2012-01-01 ENCOUNTER — Telehealth: Payer: Self-pay

## 2012-01-01 NOTE — Telephone Encounter (Signed)
Has had significant work up so far... In past CT abd and pelvis to eval.  Will have her continue mirilax and fluids, add probiotics (like Align)... If not improving make appt with Sat clinic or here next week. We will likely refer to GI. We can also move ahead with referral if she is interested.  If severe pain or fever go to ER.

## 2012-01-01 NOTE — Telephone Encounter (Signed)
Pt seen 12/01/11; abdominal cramping stopped and restarted 12/31/11. Pt said lower abdominal cramping on and off since last night. No UTI symptoms except frequency of urine. Pt drinking a lot of fluids, taking Miralax daily. BM 12/31/11 normal; no diarrhea or constipation. Pain level now 5. Pt does not have thermometer ? Fever. [facility] [address]Please advise.

## 2012-01-01 NOTE — Telephone Encounter (Signed)
Patient notified and advised if her symptoms do not improve to go to Sat clinic or make appt here next week, and if severe pain or fever go to ER.  Patient understands.

## 2012-01-05 ENCOUNTER — Inpatient Hospital Stay (HOSPITAL_COMMUNITY)
Admission: AD | Admit: 2012-01-05 | Discharge: 2012-01-09 | DRG: 603 | Disposition: A | Discharge status: Home / Self Care | Payer: Medicare PPO | Source: Ambulatory Visit | Attending: Family Medicine | Admitting: Family Medicine

## 2012-01-05 ENCOUNTER — Ambulatory Visit (INDEPENDENT_AMBULATORY_CARE_PROVIDER_SITE_OTHER): Payer: 59 | Admitting: Family Medicine

## 2012-01-05 ENCOUNTER — Telehealth (INDEPENDENT_AMBULATORY_CARE_PROVIDER_SITE_OTHER): Payer: Self-pay | Admitting: General Surgery

## 2012-01-05 ENCOUNTER — Telehealth: Payer: Self-pay | Admitting: Family Medicine

## 2012-01-05 ENCOUNTER — Inpatient Hospital Stay (HOSPITAL_COMMUNITY): Payer: Medicare PPO

## 2012-01-05 ENCOUNTER — Encounter: Payer: Self-pay | Admitting: Family Medicine

## 2012-01-05 ENCOUNTER — Encounter: Payer: Self-pay | Admitting: Internal Medicine

## 2012-01-05 VITALS — BP 138/90 | HR 80 | Temp 99.1°F | Resp 16 | Wt 173.0 lb

## 2012-01-05 DIAGNOSIS — M545 Low back pain, unspecified: Secondary | ICD-10-CM

## 2012-01-05 DIAGNOSIS — E663 Overweight: Secondary | ICD-10-CM

## 2012-01-05 DIAGNOSIS — D139 Benign neoplasm of ill-defined sites within the digestive system: Secondary | ICD-10-CM

## 2012-01-05 DIAGNOSIS — S8010XA Contusion of unspecified lower leg, initial encounter: Secondary | ICD-10-CM

## 2012-01-05 DIAGNOSIS — M25569 Pain in unspecified knee: Secondary | ICD-10-CM

## 2012-01-05 DIAGNOSIS — I152 Hypertension secondary to endocrine disorders: Secondary | ICD-10-CM | POA: Diagnosis present

## 2012-01-05 DIAGNOSIS — E559 Vitamin D deficiency, unspecified: Secondary | ICD-10-CM

## 2012-01-05 DIAGNOSIS — M1990 Unspecified osteoarthritis, unspecified site: Secondary | ICD-10-CM

## 2012-01-05 DIAGNOSIS — J309 Allergic rhinitis, unspecified: Secondary | ICD-10-CM

## 2012-01-05 DIAGNOSIS — I4891 Unspecified atrial fibrillation: Secondary | ICD-10-CM

## 2012-01-05 DIAGNOSIS — E1169 Type 2 diabetes mellitus with other specified complication: Secondary | ICD-10-CM | POA: Diagnosis present

## 2012-01-05 DIAGNOSIS — L03119 Cellulitis of unspecified part of limb: Principal | ICD-10-CM | POA: Diagnosis present

## 2012-01-05 DIAGNOSIS — R5381 Other malaise: Secondary | ICD-10-CM

## 2012-01-05 DIAGNOSIS — K219 Gastro-esophageal reflux disease without esophagitis: Secondary | ICD-10-CM

## 2012-01-05 DIAGNOSIS — R609 Edema, unspecified: Secondary | ICD-10-CM

## 2012-01-05 DIAGNOSIS — R161 Splenomegaly, not elsewhere classified: Secondary | ICD-10-CM

## 2012-01-05 DIAGNOSIS — M79609 Pain in unspecified limb: Secondary | ICD-10-CM

## 2012-01-05 DIAGNOSIS — D739 Disease of spleen, unspecified: Secondary | ICD-10-CM

## 2012-01-05 DIAGNOSIS — K222 Esophageal obstruction: Secondary | ICD-10-CM

## 2012-01-05 DIAGNOSIS — L02419 Cutaneous abscess of limb, unspecified: Principal | ICD-10-CM | POA: Diagnosis present

## 2012-01-05 DIAGNOSIS — Z7902 Long term (current) use of antithrombotics/antiplatelets: Secondary | ICD-10-CM

## 2012-01-05 DIAGNOSIS — Z79899 Other long term (current) drug therapy: Secondary | ICD-10-CM

## 2012-01-05 DIAGNOSIS — I429 Cardiomyopathy, unspecified: Secondary | ICD-10-CM | POA: Diagnosis present

## 2012-01-05 DIAGNOSIS — Z7901 Long term (current) use of anticoagulants: Secondary | ICD-10-CM

## 2012-01-05 DIAGNOSIS — E785 Hyperlipidemia, unspecified: Secondary | ICD-10-CM

## 2012-01-05 DIAGNOSIS — K5730 Diverticulosis of large intestine without perforation or abscess without bleeding: Secondary | ICD-10-CM

## 2012-01-05 DIAGNOSIS — D126 Benign neoplasm of colon, unspecified: Secondary | ICD-10-CM

## 2012-01-05 DIAGNOSIS — R1030 Lower abdominal pain, unspecified: Secondary | ICD-10-CM

## 2012-01-05 DIAGNOSIS — D1399 Benign neoplasm of ill-defined sites within the digestive system: Secondary | ICD-10-CM

## 2012-01-05 DIAGNOSIS — K449 Diaphragmatic hernia without obstruction or gangrene: Secondary | ICD-10-CM

## 2012-01-05 DIAGNOSIS — L0390 Cellulitis, unspecified: Secondary | ICD-10-CM

## 2012-01-05 DIAGNOSIS — E118 Type 2 diabetes mellitus with unspecified complications: Secondary | ICD-10-CM

## 2012-01-05 DIAGNOSIS — R5383 Other fatigue: Secondary | ICD-10-CM

## 2012-01-05 DIAGNOSIS — I4892 Unspecified atrial flutter: Secondary | ICD-10-CM | POA: Diagnosis present

## 2012-01-05 DIAGNOSIS — I509 Heart failure, unspecified: Secondary | ICD-10-CM

## 2012-01-05 DIAGNOSIS — L0291 Cutaneous abscess, unspecified: Secondary | ICD-10-CM | POA: Insufficient documentation

## 2012-01-05 DIAGNOSIS — Z452 Encounter for adjustment and management of vascular access device: Secondary | ICD-10-CM

## 2012-01-05 DIAGNOSIS — R059 Cough, unspecified: Secondary | ICD-10-CM

## 2012-01-05 DIAGNOSIS — M5126 Other intervertebral disc displacement, lumbar region: Secondary | ICD-10-CM

## 2012-01-05 DIAGNOSIS — M810 Age-related osteoporosis without current pathological fracture: Secondary | ICD-10-CM

## 2012-01-05 DIAGNOSIS — I10 Essential (primary) hypertension: Secondary | ICD-10-CM

## 2012-01-05 DIAGNOSIS — R05 Cough: Secondary | ICD-10-CM

## 2012-01-05 DIAGNOSIS — K5900 Constipation, unspecified: Secondary | ICD-10-CM

## 2012-01-05 DIAGNOSIS — J45909 Unspecified asthma, uncomplicated: Secondary | ICD-10-CM

## 2012-01-05 LAB — COMPREHENSIVE METABOLIC PANEL
ALT: 8 U/L (ref 0–35)
AST: 10 U/L (ref 0–37)
Alkaline Phosphatase: 91 U/L (ref 39–117)
CO2: 27 mEq/L (ref 19–32)
GFR calc Af Amer: 77 mL/min — ABNORMAL LOW (ref >90)
GFR calc non Af Amer: 67 mL/min — ABNORMAL LOW (ref >90)
Glucose, Bld: 118 mg/dL — ABNORMAL HIGH (ref 70–99)
Potassium: 4 mEq/L (ref 3.5–5.1)
Sodium: 131 mEq/L — ABNORMAL LOW (ref 135–145)
Total Protein: 7.5 g/dL (ref 6.0–8.3)

## 2012-01-05 LAB — CBC
Hemoglobin: 13.7 g/dL (ref 12.0–15.0)
Platelets: 195 10*3/uL (ref 150–400)
RBC: 4.63 MIL/uL (ref 3.87–5.11)

## 2012-01-05 LAB — URINE MICROSCOPIC-ADD ON

## 2012-01-05 LAB — POCT INR: INR: 1.9

## 2012-01-05 LAB — URINALYSIS, ROUTINE W REFLEX MICROSCOPIC
Glucose, UA: NEGATIVE mg/dL
Ketones, ur: 40 mg/dL — AB
Protein, ur: 30 mg/dL — AB
pH: 5.5 (ref 5.0–8.0)

## 2012-01-05 LAB — HM DIABETES EYE EXAM

## 2012-01-05 MED ORDER — ACETAMINOPHEN 325 MG PO TABS
650.0000 mg | ORAL_TABLET | Freq: Four times a day (QID) | ORAL | Status: DC | PRN
  Administered 2012-01-06 – 2012-01-07 (×3): 650 mg via ORAL
  Filled 2012-01-05 (×3): qty 2

## 2012-01-05 MED ORDER — INSULIN ASPART 100 UNIT/ML ~~LOC~~ SOLN: 0.0000 [IU] | Freq: Every day | SUBCUTANEOUS | Status: DC

## 2012-01-05 MED ORDER — MORPHINE SULFATE 2 MG/ML IJ SOLN: 1.0000 mg | INTRAMUSCULAR | Status: DC | PRN

## 2012-01-05 MED ORDER — SENNOSIDES-DOCUSATE SODIUM 8.6-50 MG PO TABS
1.0000 | ORAL_TABLET | Freq: Every evening | ORAL | Status: DC | PRN
  Filled 2012-01-05: qty 1

## 2012-01-05 MED ORDER — VANCOMYCIN HCL 1000 MG IV SOLR
1250.0000 mg | INTRAVENOUS | Status: DC
  Administered 2012-01-05 – 2012-01-08 (×5): 1250 mg via INTRAVENOUS
  Filled 2012-01-05 (×7): qty 1250

## 2012-01-05 MED ORDER — SODIUM CHLORIDE 0.9 % IV SOLN: 250.0000 mL | INTRAVENOUS | Status: DC | PRN

## 2012-01-05 MED ORDER — INSULIN ASPART 100 UNIT/ML ~~LOC~~ SOLN: 0.0000 [IU] | Freq: Three times a day (TID) | SUBCUTANEOUS | Status: DC

## 2012-01-05 MED ORDER — ONDANSETRON HCL 4 MG PO TABS: 4.0000 mg | ORAL_TABLET | Freq: Four times a day (QID) | ORAL | Status: DC | PRN

## 2012-01-05 MED ORDER — PIPERACILLIN-TAZOBACTAM 3.375 G IVPB: 3.3750 g | Freq: Three times a day (TID) | INTRAVENOUS | Status: DC

## 2012-01-05 MED ORDER — SODIUM CHLORIDE 0.9 % IJ SOLN
3.0000 mL | Freq: Two times a day (BID) | INTRAMUSCULAR | Status: DC
  Administered 2012-01-05 – 2012-01-09 (×5): 3 mL via INTRAVENOUS

## 2012-01-05 MED ORDER — ZOLPIDEM TARTRATE 5 MG PO TABS: 5.0000 mg | ORAL_TABLET | Freq: Every evening | ORAL | Status: DC | PRN

## 2012-01-05 MED ORDER — ACETAMINOPHEN 650 MG RE SUPP: 650.0000 mg | Freq: Four times a day (QID) | RECTAL | Status: DC | PRN

## 2012-01-05 MED ORDER — SODIUM CHLORIDE 0.9 % IJ SOLN: 3.0000 mL | INTRAMUSCULAR | Status: DC | PRN

## 2012-01-05 MED ORDER — ONDANSETRON HCL 4 MG/2ML IJ SOLN: 4.0000 mg | Freq: Four times a day (QID) | INTRAMUSCULAR | Status: DC | PRN

## 2012-01-05 MED ORDER — PIPERACILLIN-TAZOBACTAM 3.375 G IVPB
3.3750 g | Freq: Three times a day (TID) | INTRAVENOUS | Status: DC
  Administered 2012-01-05 – 2012-01-06 (×2): 3.375 g via INTRAVENOUS
  Filled 2012-01-05 (×5): qty 50

## 2012-01-05 MED ORDER — CLONIDINE HCL 0.1 MG PO TABS
0.1000 mg | ORAL_TABLET | Freq: Four times a day (QID) | ORAL | Status: DC | PRN
  Filled 2012-01-05: qty 1

## 2012-01-05 MED ORDER — ALUM & MAG HYDROXIDE-SIMETH 200-200-20 MG/5ML PO SUSP: 30.0000 mL | Freq: Four times a day (QID) | ORAL | Status: DC | PRN

## 2012-01-05 NOTE — Progress Notes (Signed)
ANTIBIOTIC CONSULT NOTE - INITIAL  Pharmacy Consult for Vancomycin, medication review Indication: right inner thigh abscess  Allergies  Allergen Reactions  . Percocet (Oxycodone-Acetaminophen) Nausea Only       . Tramadol Nausea Only    Patient Measurements: Height: 5' 2.6" (159 cm) Weight: 173 lb (78.472 kg) IBW/kg (Calculated) : 51.48   Vital Signs: Temp: 100.7 F (38.2 C) (09/10 2100) Temp src: Oral (09/10 1221) BP: 152/102 mmHg (09/10 2100) Pulse Rate: 105  (09/10 2100)  Labs:  [facility] 01/05/12 2140  WBC 20.8*  HGB 13.7  PLT 195  [facility] --  CREATININE --   Serum creatinine 0.8 on 12/01/11;  0.81 tonight .Estimated Creatinine Clearance: 55.2 ml/min (by C-G formula based on Cr of 0.8).  Microbiology:   Blood cultures drawn tonight  Medical History: Past Medical History  Diagnosis Date  . Overweight   . Secondary cardiomyopathy, unspecified   . Atrial flutter   . Congestive heart failure, unspecified   . Edema   . Other and unspecified hyperlipidemia   . Unspecified essential hypertension   . Allergic rhinitis, cause unspecified   . Colonic polyp   . Diverticulosis of colon (without mention of hemorrhage)   . Other malaise and fatigue   . Stricture and stenosis of esophagus   . Bacterial pneumonia, unspecified   . Other abnormal glucose   . Esophageal reflux   . Osteoarthrosis, unspecified whether generalized or localized, unspecified site   . Displacement of lumbar intervertebral disc without myelopathy   . Diaphragmatic hernia without mention of obstruction or gangrene   . Encounter for long-term (current) use of anticoagulants   . Atrial fibrillation   . Prediabetes   . Splenic mass 08/21/2011    Medications:  Prescriptions prior to admission  Medication Sig Dispense Refill  . Ascorbic Acid (VITAMIN C PO) Take 1 tablet by mouth daily.      Marland Kitchen atorvastatin (LIPITOR) 80 MG tablet Take 80 mg by mouth daily.      . benzonatate (TESSALON) 100 MG  capsule Take 100 mg by mouth 3 (three) times daily as needed. For cough      . Cholecalciferol (VITAMIN D-3 PO) Take 1 tablet by mouth daily.      . cloNIDine (CATAPRES) 0.2 MG tablet Take 0.2 mg by mouth 2 (two) times daily.       . digoxin (LANOXIN) 0.125 MG tablet Take 0.125 mg by mouth daily.      Marland Kitchen diltiazem (CARDIZEM) 30 MG tablet Take 30 mg by mouth 4 (four) times daily as needed. For fast heart beat      . fluorometholone (FML) 0.1 % ophthalmic suspension Place 2 drops into both eyes daily as needed. For itching      . furosemide (LASIX) 80 MG tablet Take 80 mg by mouth daily as needed. For excessive fluid      . lisinopril (PRINIVIL,ZESTRIL) 40 MG tablet Take 1 tablet (40 mg total) by mouth daily.  30 tablet  0  . metoprolol (TOPROL-XL) 200 MG 24 hr tablet Take 200 mg by mouth daily.      Marland Kitchen omeprazole (PRILOSEC) 20 MG capsule Take 20 mg by mouth daily.      Marland Kitchen warfarin (COUMADIN) 2.5 MG tablet Take 1.25 mg by mouth See admin instructions. Take on Tues, Thurs and Sat (alternate with 5 mg on other days)      . warfarin (COUMADIN) 5 MG tablet Take 5 mg by mouth See admin instructions. Take on Mon., Wed,  Fri, and Sunday (1/2 2.5 mg tablet (1.25 mg) on other days)       Assessment:    78 year old woman admitted with mass/abscess on right inner thigh. Noted to have had abscesses in the past, which required I&D.  To begin Vancomycin and Zosyn. Surgery to see.   Home meds reviewed and discussed with patient and daughter.  Pt states that she takes her Clonidine only once a day, instead of the twice daily prescribed, because it makes her dizzy.  Daughter is concerned that she may be on too many BP meds.  Uses prn Lasix once or twice a week. Discussed briefly with Dr. Joneen Roach.     INR therapeutic on home regimen.  Coumadin to be held.   PT/INR to be repeated in am.   Goal of Therapy:  Vancomycin trough level 10-15 mcg/ml  Plan:   Will begin Vancomycin 1250 mg IV q24hrs.  Will follow renal  function, culture data, and clinical status.  Coumadin on hold; PT/INR in am.    Angela Ali, RPh Pager: [phone] 01/05/2012,10:30 PM

## 2012-01-05 NOTE — Progress Notes (Signed)
  Subjective:    Patient ID: Angela Ali, female    DOB: 21-Jan-1929, 78 y.o.   MRN: [id number]  HPI  78 year old female with history of DM, CHF,  afib on coumadin presents with boil on right inner thigh... Tender, redness, hard for her to see. Gradually worse in last few days.  She has been putting "boilease" and perxoide on it.  Some bloody discharge.  Has had low grade fever. Feels tired and ill.  No vomiting. No confusion.  Abdominal cramping.Marland Kitchen Resolved with probiotic, mirilax and water.     Review of Systems  Constitutional: Positive for fever and fatigue.  HENT: Negative for ear pain.   Eyes: Negative for pain.  Respiratory: Negative for shortness of breath.   Cardiovascular: Negative for chest pain.  Gastrointestinal: Negative for abdominal pain.       Objective:   Physical Exam  Constitutional: She is oriented to person, place, and time.       Obese appearing femlae in mild distress fatigued appearing  HENT:  Mouth/Throat: Oropharynx is clear and moist.  Eyes: Conjunctivae and EOM are normal. Pupils are equal, round, and reactive to light.  Neck: Normal range of motion. Neck supple. No thyromegaly present.  Cardiovascular: Normal pulses.  An irregular rhythm present. Exam reveals distant heart sounds.   Pulmonary/Chest: Effort normal and breath sounds normal. She has no decreased breath sounds. She has no wheezes. She has no rhonchi. She has no rales.  Abdominal: Soft. Normal appearance. There is no hepatosplenomegaly. There is generalized tenderness. There is no rebound and no CVA tenderness.  Neurological: She is alert and oriented to person, place, and time. She has normal strength and normal reflexes. She displays a negative Romberg sign.  Skin:       Right groin pustule with 1 inch fluctuant area, 7 inches of surrounding indurated red firm tissue, erythematous redness streaking down leg to mid upper thigh.  Psychiatric: She has a normal mood and affect. Her  speech is normal and behavior is normal. Judgment and thought content normal. Cognition and memory are normal.          Assessment & Plan:

## 2012-01-05 NOTE — Patient Instructions (Signed)
Go to [facility] admitting

## 2012-01-05 NOTE — Telephone Encounter (Signed)
JAMIE FROM [facility] CALLED TO ASK IF PT WITH A LARGE 8" ABSCESS ON UPPER INNER THIGH COULD BE SEEN IN URGENT OFFICE TODAY. PT IS ALSO A DIABETIC AND TAKING COUMADIN. I REVIEWED THIS WITH DR. Abbey Chatters. HE INSTRUCTED ME TO TELL JAMIE TO HAVE DR. Sherrilyn Rist PATIENT TO [place], START IV ANTIBIOTIC, COUMADIN REVERSAL AND REQUEST SURGICAL CONSULT. I CALLED JAMIE/ [id number] AND GAVE HER THE ABOVE INSTRUCTIONS/ GY

## 2012-01-05 NOTE — Patient Instructions (Signed)
Continue current dose, check in 4 weeks  

## 2012-01-05 NOTE — Telephone Encounter (Signed)
Robin with [facility] [place] called to say that Pt would be in [address] [place], Rm 6.

## 2012-01-05 NOTE — H&P (Signed)
PCP:   Kerby Nora, MD   Chief Complaint:  Right abscess  HPI: This is arousable pleasant 78 year old female who states she developed mass/abscess on the right inner thigh on Friday. Is been warm and painful. She denies any fevers but reports chills. She denies any nausea or vomiting. She states she's had difficulty walking because the ear or rubs and causes discomfort. She has had abscesses in the past perhaps 2 that had to be I&D. No history of MRSA. She saw her PCP today who sent her over for direct admission for possible I&D of an abscess. History provided by patient and family members are at the bedside.   Review of Systems:  The patient denies anorexia, fever, weight loss,, vision loss, decreased hearing, hoarseness, chest pain, syncope, dyspnea on exertion, peripheral edema, balance deficits, hemoptysis, abdominal pain, melena, hematochezia, severe indigestion/heartburn, hematuria, incontinence, genital sores, muscle weakness, suspicious skin lesions, transient blindness, difficulty walking, depression, unusual weight change, abnormal bleeding, enlarged lymph nodes, angioedema, and breast masses.  Past Medical History: Past Medical History  Diagnosis Date  . Overweight   . Secondary cardiomyopathy, unspecified   . Atrial flutter   . Congestive heart failure, unspecified   . Edema   . Other and unspecified hyperlipidemia   . Unspecified essential hypertension   . Allergic rhinitis, cause unspecified   . Colonic polyp   . Diverticulosis of colon (without mention of hemorrhage)   . Other malaise and fatigue   . Stricture and stenosis of esophagus   . Bacterial pneumonia, unspecified   . Other abnormal glucose   . Esophageal reflux   . Osteoarthrosis, unspecified whether generalized or localized, unspecified site   . Displacement of lumbar intervertebral disc without myelopathy   . Diaphragmatic hernia without mention of obstruction or gangrene   . Encounter for long-term  (current) use of anticoagulants   . Atrial fibrillation   . Prediabetes   . Splenic mass 08/21/2011   Past Surgical History  Procedure Date  . Esophageal dilation 01/2007  . Abdominal hysterectomy 1971    partial  . Ankle surgery 2001  . Cholecystectomy 2004    Medications: Prior to Admission medications   Medication Sig Start Date End Date Taking? Authorizing Provider  Ascorbic Acid (VITAMIN C PO) Take 1 tablet by mouth daily.   Yes Historical Provider, MD  atorvastatin (LIPITOR) 80 MG tablet Take 80 mg by mouth daily.   Yes Historical Provider, MD  benzonatate (TESSALON) 100 MG capsule Take 100 mg by mouth 3 (three) times daily as needed. For cough   Yes Historical Provider, MD  Cholecalciferol (VITAMIN D-3 PO) Take 1 tablet by mouth daily.   Yes Historical Provider, MD  cloNIDine (CATAPRES) 0.2 MG tablet Take 0.2 mg by mouth 2 (two) times daily.    Yes Historical Provider, MD  digoxin (LANOXIN) 0.125 MG tablet Take 0.125 mg by mouth daily.   Yes Historical Provider, MD  diltiazem (CARDIZEM) 30 MG tablet Take 30 mg by mouth 4 (four) times daily as needed. For fast heart beat   Yes Historical Provider, MD  fluorometholone (FML) 0.1 % ophthalmic suspension Place 2 drops into both eyes daily as needed. For itching 11/24/11  Yes Historical Provider, MD  furosemide (LASIX) 80 MG tablet Take 80 mg by mouth daily as needed. For excessive fluid   Yes Historical Provider, MD  lisinopril (PRINIVIL,ZESTRIL) 40 MG tablet Take 1 tablet (40 mg total) by mouth daily. 09/03/11  Yes [name] Michelle Nasuti, MD  metoprolol (TOPROL-XL)  200 MG 24 hr tablet Take 200 mg by mouth daily.   Yes Historical Provider, MD  omeprazole (PRILOSEC) 20 MG capsule Take 20 mg by mouth daily.   Yes Historical Provider, MD  warfarin (COUMADIN) 2.5 MG tablet Take 1.25 mg by mouth See admin instructions. Take on Tues, Thurs and Sat (alternate with 5 mg on other days)   Yes Historical Provider, MD  warfarin (COUMADIN) 5 MG tablet Take 5  mg by mouth See admin instructions. Take on Mon., Wed, Fri, and Sunday (1/2 2.5 mg tablet (1.25 mg) on other days)   Yes Historical Provider, MD    Allergies:   Allergies  Allergen Reactions  . Percocet (Oxycodone-Acetaminophen) Nausea Only       . Tramadol Nausea Only    Social History:  reports that she has never smoked. She has never used smokeless tobacco. She reports that she does not drink alcohol or use illicit drugs. uses a cane, fall risk due to balance. Lives at home with family.  Family History: Family History  Problem Relation Age of Onset  . Emphysema Father 64  . Coronary artery disease Mother 77  . Hypertension Mother 30  . Hyperlipidemia Sister     # 1  . Hyperlipidemia Sister     # 2  . Diabetes Daughter     # 1  . Hypertension Daughter   . Diabetes Daughter     # 2   . Hypertension Daughter   . Ovarian cancer Daughter   . Cancer Son     bone marrow ca    Physical Exam: Filed Vitals:   01/05/12 1900  Height: 5' 2.6" (1.59 m)  Weight: 78.472 kg (173 lb)    General:  Alert and oriented times three, well developed and nourished, no acute distress Eyes: PERRLA, pink conjunctiva, no scleral icterus ENT: Moist oral mucosa, neck supple, no thyromegaly Lungs: clear to ascultation, no wheeze, no crackles, no use of accessory muscles Cardiovascular: regular rate and rhythm, no regurgitation, no gallops, no murmurs. No carotid bruits, no JVD Abdomen: soft, positive BS, non-tender, non-distended, no organomegaly, not an acute abdomen GU: not examined Neuro: CN II - XII grossly intact, sensation intact Musculoskeletal: strength 5/5 all extremities, no clubbing, cyanosis or edema, indurated firm warm area within the right inner thigh, tender to palpation  Skin: no rash, no subcutaneous crepitation, no decubitus Psych: appropriate patient   Labs on Admission:  No results found for this basename:  NA:2,K:2,CL:2,CO2:2,GLUCOSE:2,BUN:2,CREATININE:2,CALCIUM:2,MG:2,PHOS:2 in the last 72 hours No results found for this basename: AST:2,ALT:2,ALKPHOS:2,BILITOT:2,PROT:2,ALBUMIN:2 in the last 72 hours No results found for this basename: LIPASE:2,AMYLASE:2 in the last 72 hours No results found for this basename: WBC:2,NEUTROABS:2,HGB:2,HCT:2,MCV:2,PLT:2 in the last 72 hours No results found for this basename: CKTOTAL:3,CKMB:3,CKMBINDEX:3,TROPONINI:3 in the last 72 hours No components found with this basename: POCBNP:3 No results found for this basename: DDIMER:2 in the last 72 hours No results found for this basename: HGBA1C:2 in the last 72 hours No results found for this basename: CHOL:2,HDL:2,LDLCALC:2,TRIG:2,CHOLHDL:2,LDLDIRECT:2 in the last 72 hours No results found for this basename: TSH,T4TOTAL,FREET3,T3FREE,THYROIDAB in the last 72 hours No results found for this basename: VITAMINB12:2,FOLATE:2,FERRITIN:2,TIBC:2,IRON:2,RETICCTPCT:2 in the last 72 hours  Micro Results: No results found for this or any previous visit (from the past 240 hour(s)).   Radiological Exams on Admission: No results found.  Assessment/Plan Present on Admission:  .Abscess, right thigh Admit to telemetry Blood cultures and labs ordered, results pending Ultrasound right thigh ordered to evaluate abscess Antibiotics vancomycin  and Zosyn ordered  Pain medication as needed  Consult surgery pending results of ultrasound .atrial fibrillation Coumadin on hold, INR ordered  .Diabetes mellitus type 2 diet controlled, ADA diet and sliding scale insulin in order .HYPERTENSION .HYPERLIPIDEMIA .CARDIOMYOPATHY, SECONDARY stable home medications resumed    Full code SCDs for DVT prophylaxis   Eathan Groman 01/05/2012, 9:18 PM

## 2012-01-05 NOTE — Assessment & Plan Note (Signed)
Contacted [facility] for possible outpt I and D...they recommended (given age, multiple medical issues, size of lesion and the fact she is on coumadin) that she be admitted for IV antibiotics, possible coumdin reversal (INR today was low at 1.9) and surgical consult. Hospitalist servces were contacted for direct admission.

## 2012-01-06 ENCOUNTER — Ambulatory Visit: Payer: 59

## 2012-01-06 DIAGNOSIS — L089 Local infection of the skin and subcutaneous tissue, unspecified: Secondary | ICD-10-CM

## 2012-01-06 DIAGNOSIS — I509 Heart failure, unspecified: Secondary | ICD-10-CM

## 2012-01-06 DIAGNOSIS — I4891 Unspecified atrial fibrillation: Secondary | ICD-10-CM

## 2012-01-06 DIAGNOSIS — E118 Type 2 diabetes mellitus with unspecified complications: Secondary | ICD-10-CM

## 2012-01-06 LAB — PROTIME-INR
INR: 2.1 — ABNORMAL HIGH (ref 0.00–1.49)
INR: 1.55 — ABNORMAL HIGH (ref 0.00–1.49)
Prothrombin Time: 23.9 seconds — ABNORMAL HIGH (ref 11.6–15.2)

## 2012-01-06 LAB — BASIC METABOLIC PANEL
BUN: 11 mg/dL (ref 6–23)
Calcium: 9.6 mg/dL (ref 8.4–10.5)
Chloride: 97 mEq/L (ref 96–112)
Creatinine, Ser: 0.8 mg/dL (ref 0.50–1.10)
GFR calc Af Amer: 79 mL/min — ABNORMAL LOW (ref >90)

## 2012-01-06 LAB — CBC
HCT: 38.4 % (ref 36.0–46.0)
MCHC: 33.9 g/dL (ref 30.0–36.0)
MCV: 86.3 fL (ref 78.0–100.0)
Platelets: 181 10*3/uL (ref 150–400)
RDW: 14.7 % (ref 11.5–15.5)

## 2012-01-06 LAB — TYPE AND SCREEN
ABO/RH(D): A POS
Antibody Screen: NEGATIVE

## 2012-01-06 LAB — GLUCOSE, CAPILLARY: Glucose-Capillary: 117 mg/dL — ABNORMAL HIGH (ref 70–99)

## 2012-01-06 MED ORDER — VITAMIN K1 10 MG/ML IJ SOLN
5.0000 mg | Freq: Two times a day (BID) | INTRAVENOUS | Status: AC
  Administered 2012-01-06 (×2): 5 mg via INTRAVENOUS
  Filled 2012-01-06 (×2): qty 0.5

## 2012-01-06 MED ORDER — METOPROLOL SUCCINATE ER 100 MG PO TB24: 200.0000 mg | ORAL_TABLET | Freq: Every day | ORAL | Status: DC

## 2012-01-06 MED ORDER — HYDROXYZINE HCL 25 MG PO TABS: 25.0000 mg | ORAL_TABLET | Freq: Three times a day (TID) | ORAL | Status: DC | PRN

## 2012-01-06 MED ORDER — DILTIAZEM HCL 30 MG PO TABS
30.0000 mg | ORAL_TABLET | Freq: Four times a day (QID) | ORAL | Status: DC
  Administered 2012-01-06 – 2012-01-09 (×13): 30 mg via ORAL
  Filled 2012-01-06 (×18): qty 1

## 2012-01-06 MED ORDER — ALPRAZOLAM 0.25 MG PO TABS: 0.2500 mg | ORAL_TABLET | Freq: Once | ORAL | Status: AC | PRN

## 2012-01-06 MED ORDER — LISINOPRIL 40 MG PO TABS
40.0000 mg | ORAL_TABLET | Freq: Every day | ORAL | Status: DC
  Administered 2012-01-06 – 2012-01-09 (×4): 40 mg via ORAL
  Filled 2012-01-06 (×4): qty 1

## 2012-01-06 MED ORDER — ATORVASTATIN CALCIUM 80 MG PO TABS
80.0000 mg | ORAL_TABLET | Freq: Every day | ORAL | Status: DC
  Administered 2012-01-06 – 2012-01-08 (×3): 80 mg via ORAL
  Filled 2012-01-06 (×4): qty 1

## 2012-01-06 MED ORDER — SODIUM CHLORIDE 0.9 % IV SOLN: 250.0000 mL | INTRAVENOUS | Status: DC | PRN

## 2012-01-06 MED ORDER — BENZONATATE 100 MG PO CAPS
100.0000 mg | ORAL_CAPSULE | Freq: Three times a day (TID) | ORAL | Status: DC | PRN
  Administered 2012-01-06 – 2012-01-09 (×5): 100 mg via ORAL
  Filled 2012-01-06 (×5): qty 1

## 2012-01-06 MED ORDER — DIGOXIN 125 MCG PO TABS
0.1250 mg | ORAL_TABLET | Freq: Every day | ORAL | Status: DC
  Administered 2012-01-06 – 2012-01-09 (×4): 0.125 mg via ORAL
  Filled 2012-01-06 (×4): qty 1

## 2012-01-06 MED ORDER — PIPERACILLIN-TAZOBACTAM 3.375 G IVPB
3.3750 g | Freq: Three times a day (TID) | INTRAVENOUS | Status: DC
  Administered 2012-01-06 – 2012-01-09 (×9): 3.375 g via INTRAVENOUS
  Filled 2012-01-06 (×11): qty 50

## 2012-01-06 MED ORDER — PANTOPRAZOLE SODIUM 40 MG PO TBEC
40.0000 mg | DELAYED_RELEASE_TABLET | Freq: Every day | ORAL | Status: DC
  Administered 2012-01-06 – 2012-01-09 (×4): 40 mg via ORAL
  Filled 2012-01-06 (×4): qty 1

## 2012-01-06 MED ORDER — FUROSEMIDE 80 MG PO TABS
80.0000 mg | ORAL_TABLET | Freq: Every day | ORAL | Status: DC | PRN
  Filled 2012-01-06: qty 1

## 2012-01-06 MED ORDER — METOPROLOL SUCCINATE ER 100 MG PO TB24
200.0000 mg | ORAL_TABLET | Freq: Every day | ORAL | Status: DC
  Administered 2012-01-06 – 2012-01-09 (×4): 200 mg via ORAL
  Filled 2012-01-06 (×4): qty 2

## 2012-01-06 NOTE — Progress Notes (Signed)
Utilization review completed.  

## 2012-01-06 NOTE — Consult Note (Signed)
Reason for Consult:Right thigh infection. Referring Physician: Conception Doebler is an 78 y.o. female.  HPI: 3-4 day history of right medial thigh pain.  Consulted early this AM for abscess.  Past Medical History  Diagnosis Date  . Overweight   . Secondary cardiomyopathy, unspecified   . Atrial flutter   . Congestive heart failure, unspecified   . Edema   . Other and unspecified hyperlipidemia   . Unspecified essential hypertension   . Allergic rhinitis, cause unspecified   . Colonic polyp   . Diverticulosis of colon (without mention of hemorrhage)   . Other malaise and fatigue   . Stricture and stenosis of esophagus   . Bacterial pneumonia, unspecified   . Other abnormal glucose   . Esophageal reflux   . Osteoarthrosis, unspecified whether generalized or localized, unspecified site   . Displacement of lumbar intervertebral disc without myelopathy   . Diaphragmatic hernia without mention of obstruction or gangrene   . Encounter for long-term (current) use of anticoagulants   . Atrial fibrillation   . Prediabetes   . Splenic mass 08/21/2011    Past Surgical History  Procedure Date  . Esophageal dilation 01/2007  . Abdominal hysterectomy 1971    partial  . Ankle surgery 2001  . Cholecystectomy 2004    Family History  Problem Relation Age of Onset  . Emphysema Father 18  . Coronary artery disease Mother 48  . Hypertension Mother 70  . Hyperlipidemia Sister     # 1  . Hyperlipidemia Sister     # 2  . Diabetes Daughter     # 1  . Hypertension Daughter   . Diabetes Daughter     # 2   . Hypertension Daughter   . Ovarian cancer Daughter   . Cancer Son     bone marrow ca    Social History:  reports that she has never smoked. She has never used smokeless tobacco. She reports that she does not drink alcohol or use illicit drugs.  Allergies:  Allergies  Allergen Reactions  . Percocet (Oxycodone-Acetaminophen) Nausea Only       . Tramadol Nausea Only     Medications: I have reviewed the patient's current medications.  Results for orders placed during the hospital encounter of 01/05/12 (from the past 48 hour(s))  GLUCOSE, CAPILLARY     Status: Abnormal   Collection Time   01/05/12  8:54 PM      Component Value Range Comment   Glucose-Capillary 128 (*) 70 - 99 mg/dL   CBC     Status: Abnormal   Collection Time   01/05/12  9:40 PM      Component Value Range Comment   WBC 20.8 (*) 4.0 - 10.5 K/uL    RBC 4.63  3.87 - 5.11 MIL/uL    Hemoglobin 13.7  12.0 - 15.0 g/dL    HCT 16.1  09.6 - 04.5 %    MCV 85.7  78.0 - 100.0 fL    MCH 29.6  26.0 - 34.0 pg    MCHC 34.5  30.0 - 36.0 g/dL    RDW 40.9  81.1 - 91.4 %    Platelets 195  150 - 400 K/uL   COMPREHENSIVE METABOLIC PANEL     Status: Abnormal   Collection Time   01/05/12  9:40 PM      Component Value Range Comment   Sodium 131 (*) 135 - 145 mEq/L    Potassium 4.0  3.5 -  5.1 mEq/L    Chloride 94 (*) 96 - 112 mEq/L    CO2 27  19 - 32 mEq/L    Glucose, Bld 118 (*) 70 - 99 mg/dL    BUN 13  6 - 23 mg/dL    Creatinine, Ser 0.98  0.50 - 1.10 mg/dL    Calcium 9.9  8.4 - 11.9 mg/dL    Total Protein 7.5  6.0 - 8.3 g/dL    Albumin 3.3 (*) 3.5 - 5.2 g/dL    AST 10  0 - 37 U/L    ALT 8  0 - 35 U/L    Alkaline Phosphatase 91  39 - 117 U/L    Total Bilirubin 0.6  0.3 - 1.2 mg/dL    GFR calc non Af Amer 67 (*) >90 mL/min    GFR calc Af Amer 77 (*) >90 mL/min   PROTIME-INR     Status: Abnormal   Collection Time   01/05/12  9:40 PM      Component Value Range Comment   Prothrombin Time 23.8 (*) 11.6 - 15.2 seconds    INR 2.09 (*) 0.00 - 1.49   URINALYSIS, ROUTINE W REFLEX MICROSCOPIC     Status: Abnormal   Collection Time   01/05/12 11:04 PM      Component Value Range Comment   Color, Urine AMBER (*) YELLOW BIOCHEMICALS MAY BE AFFECTED BY COLOR   APPearance CLOUDY (*) CLEAR    Specific Gravity, Urine 1.021  1.005 - 1.030    pH 5.5  5.0 - 8.0    Glucose, UA NEGATIVE  NEGATIVE mg/dL    Hgb  urine dipstick NEGATIVE  NEGATIVE    Bilirubin Urine SMALL (*) NEGATIVE    Ketones, ur 40 (*) NEGATIVE mg/dL    Protein, ur 30 (*) NEGATIVE mg/dL    Urobilinogen, UA 1.0  0.0 - 1.0 mg/dL    Nitrite NEGATIVE  NEGATIVE    Leukocytes, UA SMALL (*) NEGATIVE   URINE MICROSCOPIC-ADD ON     Status: Abnormal   Collection Time   01/05/12 11:04 PM      Component Value Range Comment   Squamous Epithelial / LPF MANY (*) RARE    WBC, UA 3-6  <3 WBC/hpf    Bacteria, UA RARE  RARE    Casts HYALINE CASTS (*) NEGATIVE   BASIC METABOLIC PANEL     Status: Abnormal   Collection Time   01/06/12  4:55 AM      Component Value Range Comment   Sodium 134 (*) 135 - 145 mEq/L    Potassium 4.2  3.5 - 5.1 mEq/L    Chloride 97  96 - 112 mEq/L    CO2 28  19 - 32 mEq/L    Glucose, Bld 112 (*) 70 - 99 mg/dL    BUN 11  6 - 23 mg/dL    Creatinine, Ser 1.47  0.50 - 1.10 mg/dL    Calcium 9.6  8.4 - 82.9 mg/dL    GFR calc non Af Amer 68 (*) >90 mL/min    GFR calc Af Amer 79 (*) >90 mL/min   CBC     Status: Abnormal   Collection Time   01/06/12  4:55 AM      Component Value Range Comment   WBC 17.1 (*) 4.0 - 10.5 K/uL    RBC 4.45  3.87 - 5.11 MIL/uL    Hemoglobin 13.0  12.0 - 15.0 g/dL    HCT 56.2  13.0 -  46.0 %    MCV 86.3  78.0 - 100.0 fL    MCH 29.2  26.0 - 34.0 pg    MCHC 33.9  30.0 - 36.0 g/dL    RDW 16.1  09.6 - 04.5 %    Platelets 181  150 - 400 K/uL   PROTIME-INR     Status: Abnormal   Collection Time   01/06/12  4:55 AM      Component Value Range Comment   Prothrombin Time 23.5 (*) 11.6 - 15.2 seconds    INR 2.05 (*) 0.00 - 1.49   GLUCOSE, CAPILLARY     Status: Abnormal   Collection Time   01/06/12  7:21 AM      Component Value Range Comment   Glucose-Capillary 117 (*) 70 - 99 mg/dL     [place] Extrem Low Right Ltd  01/06/2012  *RADIOLOGY REPORT*  Clinical Data: Evaluate right inner thigh swelling and pain.  ULTRASOUND RIGHT LOWER EXTREMITY LIMITED  Technique:  Ultrasound examination of the right  thighwas performed at the area of concern.  Comparison:  None.  Findings: There is diffuse subcutaneous edema. There is a small hypoechoic lymph node in the right groin measuring up to 1.2 cm. No evidence for a focal fluid collection.  IMPRESSION: Diffuse subcutaneous edema in the right inner thigh.  No evidence for a fluid collection.   Original Report Authenticated By: Richarda Overlie, M.D.     Review of Systems  Constitutional: Positive for fever and chills.  HENT: Negative.   Respiratory: Negative.   Cardiovascular: Positive for palpitations.  Genitourinary: Negative.   Musculoskeletal: Positive for falls.  Skin: Negative.   Endo/Heme/Allergies: Negative.   All other systems reviewed and are negative.   Blood pressure 139/85, pulse 90, temperature 98.2 F (36.8 C), resp. rate 18, height 5' 2.6" (1.59 m), weight 78.472 kg (173 lb), SpO2 98.00%. Physical Exam  Constitutional: She appears well-developed and well-nourished.  HENT:  Head: Normocephalic and atraumatic.  Eyes: EOM are normal. Pupils are equal, round, and reactive to light.  Neck: Normal range of motion. Neck supple.  Cardiovascular: Normal rate and normal heart sounds.   Respiratory: Effort normal and breath sounds normal.  GI: Soft. Bowel sounds are normal.  Genitourinary:    Pelvic exam was performed with patient supine.  Musculoskeletal: Normal range of motion.       Legs: Neurological: She is alert.  Skin: Skin is warm and dry.  Psychiatric: She has a normal mood and affect. Her behavior is normal. Judgment and thought content normal.    Assessment/Plan: Foul-smelling right medial thigh infection with likely soft tissue necrosis Anticoagulation with coumadin, INR > 2  This needs to be drained in the OR, but need to drop INR. Will give FFP and VIt K, possibly will be down enough to drain later today. Make NPO  Angela Ali 01/06/2012, 10:15 AM

## 2012-01-06 NOTE — Progress Notes (Signed)
Subjective: 78 yr old female admitted with right groin cellulitis, ultrasound of the groin shows subcutaneous edema only, and no fluid collection.  Objective: Vital signs in last 24 hours: Temp:  [98.2 F (36.8 C)-100.7 F (38.2 C)] 98.2 F (36.8 C) (09/11 0500) Pulse Rate:  [80-105] 90  (09/11 0500) Resp:  [16-18] 18  (09/11 0500) BP: (138-152)/(85-102) 139/85 mmHg (09/11 0500) SpO2:  [93 %-98 %] 98 % (09/11 0500) Weight:  [78.472 kg (173 lb)] 78.472 kg (173 lb) (09/10 1900) Weight change:  Last BM Date: 01/05/12  Intake/Output from previous day: 09/10 0701 - 09/11 0700 In: -  Out: 150 [Urine:150]     Physical Exam: Head: Normocephalic, atraumatic.  Eyes: No signs of jaundice, EOMI Nose: Mucous membranes dry.  Throat: Oropharynx nonerythematous, no exudate appreciated.  Neck: supple,No deformities, masses, or tenderness noted. Lungs: Normal respiratory effort. B/L Clear to auscultation, no crackles or wheezes.  Heart: Regular RR. S1 and S2 normal  Abdomen: BS normoactive. Soft, Nondistended, non-tender.  Extremities:Right groin has erythema, induration and tenderness on palpation   Lab Results: Basic Metabolic Panel:  Basename 01/06/12 0455 01/05/12 2140  NA 134* 131*  K 4.2 4.0  CL 97 94*  CO2 28 27  GLUCOSE 112* 118*  BUN 11 13  CREATININE 0.80 0.81  CALCIUM 9.6 9.9  MG -- --  PHOS -- --   Liver Function Tests:  [facility] 01/05/12 2140  AST 10  ALT 8  ALKPHOS 91  BILITOT 0.6  PROT 7.5  ALBUMIN 3.3*   CBC:  Basename 01/06/12 0455 01/05/12 2140  WBC 17.1* 20.8*  NEUTROABS -- --  HGB 13.0 13.7  HCT 38.4 39.7  MCV 86.3 85.7  PLT 181 195   CBG:  Basename 01/06/12 0721 01/05/12 2054  GLUCAP 117* 128*   Coagulation:  Basename 01/06/12 0455 01/05/12 2140  LABPROT 23.5* 23.8*  INR 2.05* 2.09*   Urine Drug Screen: Drugs of Abuse  No results found for this basename: labopia, cocainscrnur, labbenz, amphetmu, thcu, labbarb    Alcohol  Level: No results found for this basename: ETH:2 in the last 72 hours Urinalysis:  Basename 01/05/12 2304  COLORURINE AMBER*  LABSPEC 1.021  PHURINE 5.5  GLUCOSEU NEGATIVE  HGBUR NEGATIVE  BILIRUBINUR SMALL*  KETONESUR 40*  PROTEINUR 30*  UROBILINOGEN 1.0  NITRITE NEGATIVE  LEUKOCYTESUR SMALL*   Misc. Labs:  No results found for this or any previous visit (from the past 240 hour(s)).  Studies/Results: [place] Extrem Low Right Ltd  01/06/2012  *RADIOLOGY REPORT*  Clinical Data: Evaluate right inner thigh swelling and pain.  ULTRASOUND RIGHT LOWER EXTREMITY LIMITED  Technique:  Ultrasound examination of the right thighwas performed at the area of concern.  Comparison:  None.  Findings: There is diffuse subcutaneous edema. There is a small hypoechoic lymph node in the right groin measuring up to 1.2 cm. No evidence for a focal fluid collection.  IMPRESSION: Diffuse subcutaneous edema in the right inner thigh.  No evidence for a fluid collection.   Original Report Authenticated By: Richarda Overlie, M.D.     Medications: Scheduled Meds:   . atorvastatin  80 mg Oral q1800  . digoxin  0.125 mg Oral Daily  . diltiazem  30 mg Oral Q6H  . insulin aspart  0-15 Units Subcutaneous TID WC  . insulin aspart  0-5 Units Subcutaneous QHS  . lisinopril  40 mg Oral Daily  . metoprolol succinate  200 mg Oral Daily  . pantoprazole  40 mg Oral Q1200  .  piperacillin-tazobactam (ZOSYN)  IV  3.375 g Intravenous Q8H  . sodium chloride  3 mL Intravenous Q12H  . vancomycin  1,250 mg Intravenous Q24H  . DISCONTD: metoprolol  200 mg Oral Daily  . DISCONTD: piperacillin-tazobactam (ZOSYN)  IV  3.375 g Intravenous Q8H   Continuous Infusions:  PRN Meds:.sodium chloride, acetaminophen, acetaminophen, ALPRAZolam, alum & mag hydroxide-simeth, benzonatate, cloNIDine, furosemide, morphine injection, ondansetron (ZOFRAN) IV, ondansetron, senna-docusate, sodium chloride, zolpidem  Assessment/Plan:  Active Problems:   HYPERLIPIDEMIA  HYPERTENSION  CARDIOMYOPATHY, SECONDARY  Atrial fibrillation  Diabetes mellitus type 2 with complications  Abscess, right groin  Right groin cellulitis Continue with vancomycin and zosyn No abscess per ultrasound Will get surgery to evaluate  Atrial fibrillation HR is well controlled Continue coumadin, metoprolol, digoxin  Diabetes mellitus Continue sliding scale insulin  Hypertension BP controlled Continue metoprolol, clonidine, lisinopril        LOS: 1 day   [facility] Pager: [phone] 01/06/2012, 9:55 AM

## 2012-01-07 ENCOUNTER — Inpatient Hospital Stay (HOSPITAL_COMMUNITY): Payer: Medicare PPO | Admitting: Anesthesiology

## 2012-01-07 ENCOUNTER — Encounter (HOSPITAL_COMMUNITY): Payer: Self-pay | Admitting: Anesthesiology

## 2012-01-07 ENCOUNTER — Encounter (HOSPITAL_COMMUNITY)
Admission: AD | Disposition: A | Discharge status: Home / Self Care | Payer: Self-pay | Source: Ambulatory Visit | Attending: Family Medicine

## 2012-01-07 DIAGNOSIS — R059 Cough, unspecified: Secondary | ICD-10-CM

## 2012-01-07 DIAGNOSIS — R05 Cough: Secondary | ICD-10-CM

## 2012-01-07 LAB — GLUCOSE, CAPILLARY
Glucose-Capillary: 105 mg/dL — ABNORMAL HIGH (ref 70–99)
Glucose-Capillary: 108 mg/dL — ABNORMAL HIGH (ref 70–99)
Glucose-Capillary: 132 mg/dL — ABNORMAL HIGH (ref 70–99)
Glucose-Capillary: 132 mg/dL — ABNORMAL HIGH (ref 70–99)

## 2012-01-07 LAB — BASIC METABOLIC PANEL
BUN: 9 mg/dL (ref 6–23)
Calcium: 9.5 mg/dL (ref 8.4–10.5)
GFR calc non Af Amer: 82 mL/min — ABNORMAL LOW (ref >90)
Glucose, Bld: 117 mg/dL — ABNORMAL HIGH (ref 70–99)
Sodium: 137 mEq/L (ref 135–145)

## 2012-01-07 LAB — CBC
HCT: 34.9 % — ABNORMAL LOW (ref 36.0–46.0)
Hemoglobin: 11.9 g/dL — ABNORMAL LOW (ref 12.0–15.0)
MCH: 29.2 pg (ref 26.0–34.0)
MCHC: 34.1 g/dL (ref 30.0–36.0)

## 2012-01-07 LAB — SURGICAL PCR SCREEN: Staphylococcus aureus: NEGATIVE

## 2012-01-07 SURGERY — INCISION AND DRAINAGE, ABSCESS
Anesthesia: General | Laterality: Right | Wound class: Dirty or Infected

## 2012-01-07 MED ORDER — PROMETHAZINE HCL 25 MG/ML IJ SOLN: 6.2500 mg | INTRAMUSCULAR | Status: DC | PRN

## 2012-01-07 MED ORDER — PHENOL 1.4 % MT LIQD
2.0000 | OROMUCOSAL | Status: DC | PRN
  Administered 2012-01-07: 2 via OROMUCOSAL
  Filled 2012-01-07: qty 177

## 2012-01-07 MED ORDER — 0.9 % SODIUM CHLORIDE (POUR BTL) OPTIME
TOPICAL | Status: DC | PRN
  Administered 2012-01-07: 1000 mL

## 2012-01-07 MED ORDER — WARFARIN - PHARMACIST DOSING INPATIENT: Freq: Every day | Status: DC

## 2012-01-07 MED ORDER — PROPOFOL 10 MG/ML IV BOLUS
INTRAVENOUS | Status: DC | PRN
  Administered 2012-01-07: 150 mg via INTRAVENOUS

## 2012-01-07 MED ORDER — MEPERIDINE HCL 25 MG/ML IJ SOLN: 6.2500 mg | INTRAMUSCULAR | Status: DC | PRN

## 2012-01-07 MED ORDER — GUAIFENESIN 100 MG/5ML PO SYRP
300.0000 mg | ORAL_SOLUTION | ORAL | Status: DC | PRN
  Filled 2012-01-07: qty 118

## 2012-01-07 MED ORDER — HYDROCODONE-ACETAMINOPHEN 5-325 MG PO TABS
1.0000 | ORAL_TABLET | ORAL | Status: DC | PRN
  Administered 2012-01-08: 1 via ORAL
  Filled 2012-01-07: qty 1

## 2012-01-07 MED ORDER — ONDANSETRON HCL 4 MG/2ML IJ SOLN
INTRAMUSCULAR | Status: DC | PRN
  Administered 2012-01-07: 4 mg via INTRAVENOUS

## 2012-01-07 MED ORDER — GUAIFENESIN 100 MG/5ML PO SOLN
15.0000 mL | ORAL | Status: DC | PRN
  Administered 2012-01-07: 300 mg via ORAL
  Filled 2012-01-07: qty 15

## 2012-01-07 MED ORDER — LACTATED RINGERS IV SOLN
INTRAVENOUS | Status: DC | PRN
  Administered 2012-01-07: 10:00:00 via INTRAVENOUS

## 2012-01-07 MED ORDER — LACTATED RINGERS IV SOLN: INTRAVENOUS | Status: DC

## 2012-01-07 MED ORDER — LIDOCAINE HCL (CARDIAC) 20 MG/ML IV SOLN
INTRAVENOUS | Status: DC | PRN
  Administered 2012-01-07: 100 mg via INTRAVENOUS

## 2012-01-07 MED ORDER — FENTANYL CITRATE 0.05 MG/ML IJ SOLN: 25.0000 ug | INTRAMUSCULAR | Status: DC | PRN

## 2012-01-07 MED ORDER — WARFARIN SODIUM 4 MG PO TABS
4.0000 mg | ORAL_TABLET | ORAL | Status: AC
  Administered 2012-01-07: 4 mg via ORAL
  Filled 2012-01-07: qty 1

## 2012-01-07 MED ORDER — NEOSTIGMINE METHYLSULFATE 1 MG/ML IJ SOLN
INTRAMUSCULAR | Status: DC | PRN
  Administered 2012-01-07: 5 mg via INTRAVENOUS

## 2012-01-07 MED ORDER — ROCURONIUM BROMIDE 100 MG/10ML IV SOLN
INTRAVENOUS | Status: DC | PRN
  Administered 2012-01-07: 20 mg via INTRAVENOUS

## 2012-01-07 MED ORDER — LACTATED RINGERS IV SOLN
INTRAVENOUS | Status: DC
  Administered 2012-01-07: 10:00:00 via INTRAVENOUS

## 2012-01-07 MED ORDER — GLYCOPYRROLATE 0.2 MG/ML IJ SOLN
INTRAMUSCULAR | Status: DC | PRN
  Administered 2012-01-07: .8 mg via INTRAVENOUS

## 2012-01-07 MED ORDER — FENTANYL CITRATE 0.05 MG/ML IJ SOLN
INTRAMUSCULAR | Status: DC | PRN
  Administered 2012-01-07: 50 ug via INTRAVENOUS
  Administered 2012-01-07: 100 ug via INTRAVENOUS

## 2012-01-07 SURGICAL SUPPLY — 25 items
BANDAGE GAUZE ELAST BULKY 4 IN (GAUZE/BANDAGES/DRESSINGS) IMPLANT
CANISTER SUCTION 2500CC (MISCELLANEOUS) ×2 IMPLANT
CLOTH BEACON ORANGE TIMEOUT ST (SAFETY) ×2 IMPLANT
COVER SURGICAL LIGHT HANDLE (MISCELLANEOUS) ×2 IMPLANT
DRAPE LAPAROSCOPIC ABDOMINAL (DRAPES) ×2 IMPLANT
DRAPE PROXIMA HALF (DRAPES) ×1 IMPLANT
DRAPE UTILITY 15X26 W/TAPE STR (DRAPE) ×4 IMPLANT
DRSG PAD ABDOMINAL 8X10 ST (GAUZE/BANDAGES/DRESSINGS) ×2 IMPLANT
ELECT CAUTERY BLADE 6.4 (BLADE) ×2 IMPLANT
ELECT REM PT RETURN 9FT ADLT (ELECTROSURGICAL) ×2
ELECTRODE REM PT RTRN 9FT ADLT (ELECTROSURGICAL) ×1 IMPLANT
GAUZE PACKING IODOFORM 1 (PACKING) ×1 IMPLANT
GOWN STRL NON-REIN LRG LVL3 (GOWN DISPOSABLE) ×4 IMPLANT
KIT BASIN OR (CUSTOM PROCEDURE TRAY) ×2 IMPLANT
KIT ROOM TURNOVER OR (KITS) ×2 IMPLANT
LEGGING LITHOTOMY PAIR STRL (DRAPES) ×1 IMPLANT
NS IRRIG 1000ML POUR BTL (IV SOLUTION) ×2 IMPLANT
PACK GENERAL/GYN (CUSTOM PROCEDURE TRAY) ×2 IMPLANT
PAD ARMBOARD 7.5X6 YLW CONV (MISCELLANEOUS) ×2 IMPLANT
SPONGE GAUZE 4X4 12PLY (GAUZE/BANDAGES/DRESSINGS) ×2 IMPLANT
SWAB COLLECTION DEVICE MRSA (MISCELLANEOUS) ×1 IMPLANT
TAPE CLOTH SURG 6X10 WHT LF (GAUZE/BANDAGES/DRESSINGS) ×1 IMPLANT
TOWEL OR 17X24 6PK STRL BLUE (TOWEL DISPOSABLE) ×2 IMPLANT
TOWEL OR 17X26 10 PK STRL BLUE (TOWEL DISPOSABLE) ×2 IMPLANT
TUBE ANAEROBIC SPECIMEN COL (MISCELLANEOUS) ×1 IMPLANT

## 2012-01-07 NOTE — Transfer of Care (Signed)
Immediate Anesthesia Transfer of Care Note  Patient: Angela Ali  Procedure(s) Performed: Procedure(s) (LRB) with comments: INCISION AND DRAINAGE ABSCESS (Right)  Patient Location: PACU  Anesthesia Type: General  Level of Consciousness: awake and patient cooperative  Airway & Oxygen Therapy: Patient Spontanous Breathing and Patient connected to face mask oxygen  Post-op Assessment: Report given to PACU RN, Post -op Vital signs reviewed and stable and Patient moving all extremities X 4  Post vital signs: Reviewed and stable  Complications: No apparent anesthesia complications

## 2012-01-07 NOTE — Progress Notes (Signed)
Subjective: 78 yr old female admitted with right groin cellulitis, ultrasound of the groin shows subcutaneous edema only, and no fluid collection.Patient was seen by the surgery and underwent incision and drainage. C/o sore throat after surgery.  Objective: Vital signs in last 24 hours: Temp:  [96.8 F (36 C)-99.4 F (37.4 C)] 98.6 F (37 C) (09/12 1252) Pulse Rate:  [72-94] 87  (09/12 1252) Resp:  [17-30] 20  (09/12 1252) BP: (114-162)/(74-99) 146/82 mmHg (09/12 1252) SpO2:  [92 %-100 %] 92 % (09/12 1252) Weight change:  Last BM Date: 01/06/12  Intake/Output from previous day: 09/11 0701 - 09/12 0700 In: 557.5 [I.V.:250; Blood:307.5] Out: -  Total I/O In: 850 [I.V.:850] Out: -    Physical Exam: Head: Normocephalic, atraumatic.  Eyes: No signs of jaundice, EOMI Nose: Mucous membranes dry.  Throat: Oropharynx nonerythematous, no exudate appreciated.  Neck: supple,No deformities, masses, or tenderness noted. Lungs: Normal respiratory effort. B/L Clear to auscultation, no crackles or wheezes.  Heart: Regular RR. S1 and S2 normal  Abdomen: BS normoactive. Soft, Nondistended, non-tender.  Extremities:Right groin  In dressing   Lab Results: Basic Metabolic Panel:  Basename 01/07/12 0600 01/06/12 0455  NA 137 134*  K 3.5 4.2  CL 99 97  CO2 28 28  GLUCOSE 117* 112*  BUN 9 11  CREATININE 0.64 0.80  CALCIUM 9.5 9.6  MG -- --  PHOS -- --   Liver Function Tests:  [facility] 01/05/12 2140  AST 10  ALT 8  ALKPHOS 91  BILITOT 0.6  PROT 7.5  ALBUMIN 3.3*   CBC:  Basename 01/07/12 0600 01/06/12 0455  WBC 14.1* 17.1*  NEUTROABS -- --  HGB 11.9* 13.0  HCT 34.9* 38.4  MCV 85.5 86.3  PLT 181 181   CBG:  Basename 01/07/12 1140 01/07/12 0727 01/06/12 2037 01/06/12 1633 01/06/12 1119 01/06/12 0721  GLUCAP 108* 105* 154* 116* 118* 117*   Coagulation:  Basename 01/07/12 0934 01/06/12 1809  LABPROT 16.9* 18.9*  INR 1.35 1.55*   Urine Drug Screen: Drugs of Abuse    No results found for this basename: labopia,  cocainscrnur,  labbenz,  amphetmu,  thcu,  labbarb    Alcohol Level: No results found for this basename: ETH:2 in the last 72 hours Urinalysis:  Basename 01/05/12 2304  COLORURINE AMBER*  LABSPEC 1.021  PHURINE 5.5  GLUCOSEU NEGATIVE  HGBUR NEGATIVE  BILIRUBINUR SMALL*  KETONESUR 40*  PROTEINUR 30*  UROBILINOGEN 1.0  NITRITE NEGATIVE  LEUKOCYTESUR SMALL*   Misc. Labs:  Recent Results (from the past 240 hour(s))  CULTURE, BLOOD (ROUTINE X 2)     Status: Normal (Preliminary result)   Collection Time   01/05/12  9:25 PM      Component Value Range Status Comment   Specimen Description BLOOD LEFT HAND   Final    Special Requests BOTTLES DRAWN AEROBIC AND ANAEROBIC 5CC   Final    Culture  Setup Time 01/06/2012 05:50   Final    Culture     Final    Value:        BLOOD CULTURE RECEIVED NO GROWTH TO DATE CULTURE WILL BE HELD FOR 5 DAYS BEFORE ISSUING A FINAL NEGATIVE REPORT   Report Status PENDING   Incomplete   CULTURE, BLOOD (ROUTINE X 2)     Status: Normal (Preliminary result)   Collection Time   01/05/12  9:40 PM      Component Value Range Status Comment   Specimen Description BLOOD RIGHT ARM  Final    Special Requests BOTTLES DRAWN AEROBIC AND ANAEROBIC 10CC   Final    Culture  Setup Time 01/06/2012 05:50   Final    Culture     Final    Value:        BLOOD CULTURE RECEIVED NO GROWTH TO DATE CULTURE WILL BE HELD FOR 5 DAYS BEFORE ISSUING A FINAL NEGATIVE REPORT   Report Status PENDING   Incomplete   SURGICAL PCR SCREEN     Status: Normal   Collection Time   01/07/12  7:52 AM      Component Value Range Status Comment   MRSA, PCR NEGATIVE  NEGATIVE Final    Staphylococcus aureus NEGATIVE  NEGATIVE Final     Studies/Results: [place] Extrem Low Right Ltd  01/06/2012  *RADIOLOGY REPORT*  Clinical Data: Evaluate right inner thigh swelling and pain.  ULTRASOUND RIGHT LOWER EXTREMITY LIMITED  Technique:  Ultrasound examination of the  right thighwas performed at the area of concern.  Comparison:  None.  Findings: There is diffuse subcutaneous edema. There is a small hypoechoic lymph node in the right groin measuring up to 1.2 cm. No evidence for a focal fluid collection.  IMPRESSION: Diffuse subcutaneous edema in the right inner thigh.  No evidence for a fluid collection.   Original Report Authenticated By: Richarda Overlie, M.D.     Medications: Scheduled Meds:    . atorvastatin  80 mg Oral q1800  . digoxin  0.125 mg Oral Daily  . diltiazem  30 mg Oral Q6H  . insulin aspart  0-15 Units Subcutaneous TID WC  . insulin aspart  0-5 Units Subcutaneous QHS  . lisinopril  40 mg Oral Daily  . metoprolol succinate  200 mg Oral Daily  . pantoprazole  40 mg Oral Q1200  . phytonadione (VITAMIN K) IV  5 mg Intravenous BID  . piperacillin-tazobactam (ZOSYN)  IV  3.375 g Intravenous Q8H  . sodium chloride  3 mL Intravenous Q12H  . vancomycin  1,250 mg Intravenous Q24H  . DISCONTD: piperacillin-tazobactam (ZOSYN)  IV  3.375 g Intravenous Q8H   Continuous Infusions:    . DISCONTD: lactated ringers    . DISCONTD: lactated ringers 50 mL/hr at 01/07/12 1024   PRN Meds:.sodium chloride, acetaminophen, acetaminophen, ALPRAZolam, alum & mag hydroxide-simeth, benzonatate, cloNIDine, furosemide, HYDROcodone-acetaminophen, hydrOXYzine, ondansetron (ZOFRAN) IV, ondansetron, phenol, senna-docusate, sodium chloride, DISCONTD: 0.9 % irrigation (POUR BTL), DISCONTD: fentaNYL, DISCONTD: meperidine (DEMEROL) injection, DISCONTD:  morphine injection, DISCONTD: promethazine, DISCONTD: zolpidem  Assessment/Plan:  Active Problems:  HYPERLIPIDEMIA  HYPERTENSION  CARDIOMYOPATHY, SECONDARY  Atrial fibrillation  Diabetes mellitus type 2 with complications  Abscess, right groin  Right groin cellulitis Continue with vancomycin and zosyn S/p incision and drainage  Atrial fibrillation HR is well controlled Continue coumadin, metoprolol,  digoxin.  Diabetes mellitus Continue sliding scale insulin  Hypertension BP controlled Continue metoprolol, clonidine, lisinopril  Sore throat Will order chloraseptic cherry  Will restart coumadin if ok with surgery    LOS: 2 days   [facility] Pager: [phone] 01/07/2012, 1:37 PM

## 2012-01-07 NOTE — Anesthesia Postprocedure Evaluation (Signed)
  Anesthesia Post-op Note  Patient: Angela Ali  Procedure(s) Performed: Procedure(s) (LRB): INCISION AND DRAINAGE ABSCESS (Right)  Patient Location: PACU  Anesthesia Type: General  Level of Consciousness: awake and alert   Airway and Oxygen Therapy: Patient Spontanous Breathing  Post-op Pain: mild  Post-op Assessment: Post-op Vital signs reviewed, Patient's Cardiovascular Status Stable, Respiratory Function Stable, Patent Airway and No signs of Nausea or vomiting  Post-op Vital Signs: stable  Complications: No apparent anesthesia complications

## 2012-01-07 NOTE — Anesthesia Preprocedure Evaluation (Addendum)
Anesthesia Evaluation  Patient identified by MRN, date of birth, ID band Patient awake    Reviewed: Allergy & Precautions, H&P , NPO status , Patient's Chart, lab work & pertinent test results  Airway Mallampati: II TM Distance: >3 FB Neck ROM: Full    Dental No notable dental hx. (+) Edentulous Upper and Edentulous Lower   Pulmonary neg pulmonary ROS, neg pneumonia -,  breath sounds clear to auscultation  Pulmonary exam normal       Cardiovascular hypertension, Pt. on medications +CHF negative cardio ROS  + dysrhythmias Atrial Fibrillation Rhythm:Regular Rate:Normal     Neuro/Psych negative neurological ROS  negative psych ROS   GI/Hepatic negative GI ROS, Neg liver ROS, GERD-  Controlled and Medicated,  Endo/Other  negative endocrine ROSdiabetes, Type 2  Renal/GU negative Renal ROS  negative genitourinary   Musculoskeletal negative musculoskeletal ROS (+)   Abdominal   Peds negative pediatric ROS (+)  Hematology negative hematology ROS (+)   Anesthesia Other Findings   Reproductive/Obstetrics negative OB ROS                         Anesthesia Physical Anesthesia Plan  ASA: III  Anesthesia Plan: General   Post-op Pain Management:    Induction: Intravenous  Airway Management Planned: LMA  Additional Equipment:   Intra-op Plan:   Post-operative Plan: Extubation in OR  Informed Consent: I have reviewed the patients History and Physical, chart, labs and discussed the procedure including the risks, benefits and alternatives for the proposed anesthesia with the patient or authorized representative who has indicated his/her understanding and acceptance.   Dental advisory given  Plan Discussed with: CRNA  Anesthesia Plan Comments:         Anesthesia Quick Evaluation

## 2012-01-07 NOTE — Preoperative (Signed)
Beta Blockers   Reason not to administer Beta Blockers:toprol this am

## 2012-01-07 NOTE — Anesthesia Procedure Notes (Signed)
Procedure Name: Intubation Date/Time: 01/07/2012 10:38 AM Performed by: Sherie Don Pre-anesthesia Checklist: Patient identified, Emergency Drugs available, Suction available, Patient being monitored and Timeout performed Patient Re-evaluated:Patient Re-evaluated prior to inductionOxygen Delivery Method: Circle system utilized Preoxygenation: Pre-oxygenation with 100% oxygen Intubation Type: IV induction Ventilation: Mask ventilation without difficulty and Oral airway inserted - appropriate to patient size Laryngoscope Size: Mac and 3 Grade View: Grade I Tube type: Oral Tube size: 7.0 mm Number of attempts: 1 Airway Equipment and Method: Stylet Placement Confirmation: ETT inserted through vocal cords under direct vision,  positive ETCO2 and breath sounds checked- equal and bilateral Secured at: 23 cm Tube secured with: Tape Dental Injury: Teeth and Oropharynx as per pre-operative assessment

## 2012-01-07 NOTE — Progress Notes (Signed)
ANTICOAGULATION CONSULT NOTE - Initial Consult  Pharmacy Consult:  Coumadin Indication:  History of Atrial fibrillation  Allergies  Allergen Reactions  . Percocet (Oxycodone-Acetaminophen) Nausea Only       . Tramadol Nausea Only    Patient Measurements: Height: 5' 2.6" (159 cm) Weight: 173 lb (78.472 kg) IBW/kg (Calculated) : 51.48   Vital Signs: Temp: 98.2 F (36.8 C) (09/12 1543) Temp src: Oral (09/12 1543) BP: 148/80 mmHg (09/12 1543) Pulse Rate: 84  (09/12 1543)  Labs:  Basename 01/07/12 0934 01/07/12 0600 01/06/12 1809 01/06/12 1052 01/06/12 0455 01/05/12 2140  HGB -- 11.9* -- -- 13.0 --  HCT -- 34.9* -- -- 38.4 39.7  PLT -- 181 -- -- 181 195  APTT -- -- -- -- -- --  LABPROT 16.9* -- 18.9* 23.9* -- --  INR 1.35 -- 1.55* 2.10* -- --  HEPARINUNFRC -- -- -- -- -- --  CREATININE -- 0.64 -- -- 0.80 0.81  CKTOTAL -- -- -- -- -- --  CKMB -- -- -- -- -- --  TROPONINI -- -- -- -- -- --    Estimated Creatinine Clearance: 55.2 ml/min (by C-G formula based on Cr of 0.64).   Medical History: Past Medical History  Diagnosis Date  . Overweight   . Secondary cardiomyopathy, unspecified   . Atrial flutter   . Congestive heart failure, unspecified   . Edema   . Other and unspecified hyperlipidemia   . Unspecified essential hypertension   . Allergic rhinitis, cause unspecified   . Colonic polyp   . Diverticulosis of colon (without mention of hemorrhage)   . Other malaise and fatigue   . Stricture and stenosis of esophagus   . Bacterial pneumonia, unspecified   . Other abnormal glucose   . Esophageal reflux   . Osteoarthrosis, unspecified whether generalized or localized, unspecified site   . Displacement of lumbar intervertebral disc without myelopathy   . Diaphragmatic hernia without mention of obstruction or gangrene   . Encounter for long-term (current) use of anticoagulants   . Atrial fibrillation   . Prediabetes   . Splenic mass 08/21/2011      Assessment: 82 YOF known to pharmacy from antibiotic dosing for cellulitis.  Patient was on Coumadin PTA for history of Afib.  Coumadin held for I&D and to resume tonight.  INR currently subtherapeutic post Vitamin K and FFP.  Expect an extended period of time until INR could be therapeutic again.  No bleeding reported.  Home Coumadin regimen per patient and family: 5mg  PO daily except 2.5mg  on TTS   Goal of Therapy:  INR 2-3 Monitor platelets by anticoagulation protocol: Yes    Plan:  - Coumadin 4mg  PO today - Daily PT / INR     Tamaria Dunleavy D. Laney Potash, PharmD, BCPS Pager:  [phone] 01/07/2012, 6:21 PM

## 2012-01-07 NOTE — Op Note (Signed)
OPERATIVE REPORT  DATE OF OPERATION: 01/05/2012 - 01/07/2012  PATIENT:  Angela Ali  77 y.o. female  PRE-OPERATIVE DIAGNOSIS:  Right thigh abscess  POST-OPERATIVE DIAGNOSIS:  Right thigh abscess  PROCEDURE:  Procedure(s): INCISION AND DRAINAGE ABSCESS  SURGEON:  Surgeon(s): Cherylynn Ridges, MD  ASSISTANT: None  ANESTHESIA:   general  EBL: <50 ml  BLOOD ADMINISTERED: none  DRAINS: none and packing with about one bottle of Iodoform 1" Nugauze   SPECIMEN:  Source of Specimen:  abscess fluid for aerobi and anaerobic cultures  COUNTS CORRECT:  YES  PROCEDURE DETAILS: The patient was taken to the operating room and placed on the table initially in the supine position. After an adequate endotracheal anesthetic was administered she was placed in the lithotomy position and then prepped and draped in usual sterile manner.  After proper time out was performed identifying the patient and the procedure be performed the patient had an identifiable right medial thigh abscess which was subsequently incised with a #10 blade. We placed long incision was made and then subsequently the pocket was bluntly dissected using the surgeon's finger in order breakup of pockets. We irrigated with saline solution then packed with most of a single jar of #1 inch iodoform Nu Gauze. A sterile dressing was applied. All counts were correct.  PATIENT DISPOSITION:  PACU - hemodynamically stable.   Cherylynn Ridges 9/12/201311:13 AM

## 2012-01-08 LAB — CBC
MCH: 28.8 pg (ref 26.0–34.0)
MCHC: 33.3 g/dL (ref 30.0–36.0)
Platelets: 210 10*3/uL (ref 150–400)
RDW: 14.8 % (ref 11.5–15.5)

## 2012-01-08 LAB — GLUCOSE, CAPILLARY
Glucose-Capillary: 120 mg/dL — ABNORMAL HIGH (ref 70–99)
Glucose-Capillary: 142 mg/dL — ABNORMAL HIGH (ref 70–99)

## 2012-01-08 MED ORDER — HYDROCODONE-ACETAMINOPHEN 5-325 MG PO TABS
1.0000 | ORAL_TABLET | Freq: Four times a day (QID) | ORAL | Status: DC
  Filled 2012-01-08: qty 1

## 2012-01-08 MED ORDER — CLONIDINE HCL 0.1 MG PO TABS
0.1000 mg | ORAL_TABLET | Freq: Two times a day (BID) | ORAL | Status: DC
  Administered 2012-01-08 – 2012-01-09 (×3): 0.1 mg via ORAL
  Filled 2012-01-08 (×4): qty 1

## 2012-01-08 MED ORDER — WARFARIN SODIUM 6 MG PO TABS
6.0000 mg | ORAL_TABLET | Freq: Once | ORAL | Status: AC
  Administered 2012-01-08: 6 mg via ORAL
  Filled 2012-01-08: qty 1

## 2012-01-08 NOTE — Care Management Note (Signed)
    Page 1 of 2   01/08/2012     12:28:53 PM   CARE MANAGEMENT NOTE 01/08/2012  Patient:  Angela Ali, Angela Ali   Account Number:  [id number]  Date Initiated:  01/06/2012  Documentation initiated by:  Donn Pierini  Subjective/Objective Assessment:   Pt admitted with abscess- inner right thigh     Action/Plan:   PTA pt lived at home with family   Anticipated DC Date:  01/09/2012   Anticipated DC Plan:  HOME W HOME HEALTH SERVICES      DC Planning Services  CM consult      [facility] Choice  HOME HEALTH   Choice offered to / List presented to:  C-1 Patient           HH agency  [facility]   Status of service:  In process, will continue to follow Medicare Important Message given?  YES (If response is "NO", the following Medicare IM given date fields will be blank) Date Medicare IM given:  01/05/2012 Date Additional Medicare IM given:    Discharge Disposition:    Per UR Regulation:  Reviewed for med. necessity/level of care/duration of stay  If discussed at Long Length of Stay Meetings, dates discussed:    Comments:  PCP- BEDSOLE, [name] E  01/08/12- 1225- Donn Pierini RN, BSN [phone] Spoke with pt at bedside regarding potential d/c needs at home. Per conversation pt lives at home with daughter- she states that she has other family that also can assist. Pt has a rollator walker at home and uses both mail order and local pharmacy for drug prescription needs. Pt states that she has used [facility] in the past for [facility] and would like to use them again for any HH needs this time. She feels like she would need assistance with drsg changes when discharged. MD- please order HH-RN for wound care- NCM to make referral to [facility] when order placed.

## 2012-01-08 NOTE — Progress Notes (Signed)
ANTICOAGULATION & ANTIBIOTIC CONSULT NOTE - Follow up Consult  Pharmacy Consult:  Coumadin, Vancomycin Indication:  History of Atrial fibrillation and right inner thigh cellulitis  Allergies  Allergen Reactions  . Percocet (Oxycodone-Acetaminophen) Nausea Only       . Tramadol Nausea Only    Patient Measurements: Height: 5' 2.6" (159 cm) Weight: 173 lb (78.472 kg) IBW/kg (Calculated) : 51.48   Vital Signs: Temp: 98.6 F (37 C) (09/13 0500) Temp src: Oral (09/13 0500) BP: 126/81 mmHg (09/13 1034) Pulse Rate: 83  (09/13 1034)  Labs:  Basename 01/08/12 0616 01/07/12 0934 01/07/12 0600 01/06/12 1809 01/06/12 0455 01/05/12 2140  HGB 11.3* -- 11.9* -- -- --  HCT 33.9* -- 34.9* -- 38.4 --  PLT 210 -- 181 -- 181 --  APTT -- -- -- -- -- --  LABPROT 17.6* 16.9* -- 18.9* -- --  INR 1.42 1.35 -- 1.55* -- --  HEPARINUNFRC -- -- -- -- -- --  CREATININE -- -- 0.64 -- 0.80 0.81  CKTOTAL -- -- -- -- -- --  CKMB -- -- -- -- -- --  TROPONINI -- -- -- -- -- --    Estimated Creatinine Clearance: 55.2 ml/min (by C-G formula based on Cr of 0.64).   Assessment: 78 yo female to continue vancomycin dosing for cellulitis and coumadin dosing for AFib.    Pt to continue on Day #4 Vancomycin (Rx) and Zosyn (MD) now s/p I&D on 01/07/12. Pt is afebrile, WBC trending down (10.9), renal function has been stable. Abscess culture (9/12) pending, blood cx x2 (01/05/12) ngtd.   Patient was on Coumadin PTA for history of Afib. Home Coumadin regimen per patient and family (confirmed): 5 mg PO daily except 2.5 mg on TTSat. Coumadin held for I&D and resumed 9/12.  INR currently subtherapeutic post Vitamin K and FFP.  Expect an extended period of time until INR could be therapeutic again.  INR today is subtherapeutic (1.42), received 4 mg last night. Will give higher dose today to help increased INR towards goal. Hbg relatively stable, plts stable; no bleeding reported.  Goal of Therapy:  INR 2-3 Monitor  platelets by anticoagulation protocol: Yes Vancomycin trough level 10-15 mcg/ml   Plan:  - Continue Vancomycin 1250 mg IV q24hrs.  - Will follow renal function, culture data, and clinical status; vancomycin trough if clinically indicated. - Coumadin 6 mg PO today - Daily PT / INR   Nicolasa Ducking, PharmD Clinical Pharmacist Pgr [phone] 01/08/2012, 10:53 AM

## 2012-01-08 NOTE — Progress Notes (Signed)
Subjective: 78 yr old female admitted with right groin cellulitis, ultrasound of the groin shows subcutaneous edema only, and no fluid collection. Surgery saw the patient and took her for surgery for groin abscess incision and drainage.  Objective: Vital signs in last 24 hours: Temp:  [98.5 F (36.9 C)-98.6 F (37 C)] 98.6 F (37 C) (09/13 1406) Pulse Rate:  [81-97] 97  (09/13 1406) Resp:  [18] 18  (09/13 1406) BP: (114-175)/(51-97) 145/81 mmHg (09/13 1723) SpO2:  [93 %-97 %] 97 % (09/13 1406) Weight change:  Last BM Date: 01/06/12  Intake/Output from previous day: 09/12 0701 - 09/13 0700 In: 850 [I.V.:850] Out: -  Total I/O In: 3 [I.V.:3] Out: -    Physical Exam: Head: Normocephalic, atraumatic.  Eyes: No signs of jaundice, EOMI Nose: Mucous membranes dry.  Throat: Oropharynx nonerythematous, no exudate appreciated.  Neck: supple,No deformities, masses, or tenderness noted. Lungs: Normal respiratory effort. B/L Clear to auscultation, no crackles or wheezes.  Heart: Regular RR. S1 and S2 normal  Abdomen: BS normoactive. Soft, Nondistended, non-tender.  Extremities:Right groin  in dressing   Lab Results: Basic Metabolic Panel:  Basename 01/07/12 0600 01/06/12 0455  NA 137 134*  K 3.5 4.2  CL 99 97  CO2 28 28  GLUCOSE 117* 112*  BUN 9 11  CREATININE 0.64 0.80  CALCIUM 9.5 9.6  MG -- --  PHOS -- --   Liver Function Tests:  [facility] 01/05/12 2140  AST 10  ALT 8  ALKPHOS 91  BILITOT 0.6  PROT 7.5  ALBUMIN 3.3*   CBC:  Basename 01/08/12 0616 01/07/12 0600  WBC 10.9* 14.1*  NEUTROABS -- --  HGB 11.3* 11.9*  HCT 33.9* 34.9*  MCV 86.5 85.5  PLT 210 181   CBG:  Basename 01/08/12 1628 01/08/12 1109 01/08/12 0745 01/07/12 2037 01/07/12 1658 01/07/12 1140  GLUCAP 142* 132* 120* 132* 132* 108*   Coagulation:  Basename 01/08/12 0616 01/07/12 0934  LABPROT 17.6* 16.9*  INR 1.42 1.35   Urine Drug Screen: Drugs of Abuse  No results found for this  basename: labopia,  cocainscrnur,  labbenz,  amphetmu,  thcu,  labbarb    Alcohol Level: No results found for this basename: ETH:2 in the last 72 hours Urinalysis:  Basename 01/05/12 2304  COLORURINE AMBER*  LABSPEC 1.021  PHURINE 5.5  GLUCOSEU NEGATIVE  HGBUR NEGATIVE  BILIRUBINUR SMALL*  KETONESUR 40*  PROTEINUR 30*  UROBILINOGEN 1.0  NITRITE NEGATIVE  LEUKOCYTESUR SMALL*   Misc. Labs:  Recent Results (from the past 240 hour(s))  CULTURE, BLOOD (ROUTINE X 2)     Status: Normal (Preliminary result)   Collection Time   01/05/12  9:25 PM      Component Value Range Status Comment   Specimen Description BLOOD LEFT HAND   Final    Special Requests BOTTLES DRAWN AEROBIC AND ANAEROBIC 5CC   Final    Culture  Setup Time 01/06/2012 05:50   Final    Culture     Final    Value:        BLOOD CULTURE RECEIVED NO GROWTH TO DATE CULTURE WILL BE HELD FOR 5 DAYS BEFORE ISSUING A FINAL NEGATIVE REPORT   Report Status PENDING   Incomplete   CULTURE, BLOOD (ROUTINE X 2)     Status: Normal (Preliminary result)   Collection Time   01/05/12  9:40 PM      Component Value Range Status Comment   Specimen Description BLOOD RIGHT ARM   Final  Special Requests BOTTLES DRAWN AEROBIC AND ANAEROBIC 10CC   Final    Culture  Setup Time 01/06/2012 05:50   Final    Culture     Final    Value:        BLOOD CULTURE RECEIVED NO GROWTH TO DATE CULTURE WILL BE HELD FOR 5 DAYS BEFORE ISSUING A FINAL NEGATIVE REPORT   Report Status PENDING   Incomplete   SURGICAL PCR SCREEN     Status: Normal   Collection Time   01/07/12  7:52 AM      Component Value Range Status Comment   MRSA, PCR NEGATIVE  NEGATIVE Final    Staphylococcus aureus NEGATIVE  NEGATIVE Final   ANAEROBIC CULTURE     Status: Normal (Preliminary result)   Collection Time   01/07/12 10:58 AM      Component Value Range Status Comment   Specimen Description ABSCESS THIGH RIGHT   Final    Special Requests NONE   Final    Gram Stain PENDING    Incomplete    Culture     Final    Value: NO ANAEROBES ISOLATED; CULTURE IN PROGRESS FOR 5 DAYS   Report Status PENDING   Incomplete   CULTURE, ROUTINE-ABSCESS     Status: Normal (Preliminary result)   Collection Time   01/07/12 10:58 AM      Component Value Range Status Comment   Specimen Description ABSCESS THIGH RIGHT   Final    Special Requests NONE   Final    Gram Stain     Final    Value: MODERATE WBC PRESENT,BOTH PMN AND MONONUCLEAR     NO SQUAMOUS EPITHELIAL CELLS SEEN     FEW GRAM POSITIVE COCCI     IN PAIRS FEW GRAM VARIABLE ROD   Culture Culture reincubated for better growth   Final    Report Status PENDING   Incomplete     Studies/Results: No results found.  Medications: Scheduled Meds:    . atorvastatin  80 mg Oral q1800  . cloNIDine  0.1 mg Oral BID  . digoxin  0.125 mg Oral Daily  . diltiazem  30 mg Oral Q6H  . HYDROcodone-acetaminophen  1 tablet Oral Q6H  . insulin aspart  0-15 Units Subcutaneous TID WC  . insulin aspart  0-5 Units Subcutaneous QHS  . lisinopril  40 mg Oral Daily  . metoprolol succinate  200 mg Oral Daily  . pantoprazole  40 mg Oral Q1200  . piperacillin-tazobactam (ZOSYN)  IV  3.375 g Intravenous Q8H  . sodium chloride  3 mL Intravenous Q12H  . vancomycin  1,250 mg Intravenous Q24H  . warfarin  4 mg Oral NOW  . warfarin  6 mg Oral ONCE-1800  . Warfarin - Pharmacist Dosing Inpatient   Does not apply q1800   Continuous Infusions:  PRN Meds:.sodium chloride, acetaminophen, acetaminophen, benzonatate, cloNIDine, furosemide, guaiFENesin, hydrOXYzine, ondansetron (ZOFRAN) IV, ondansetron, phenol, senna-docusate, sodium chloride, DISCONTD: HYDROcodone-acetaminophen  Assessment/Plan:  Active Problems:  HYPERLIPIDEMIA  HYPERTENSION  CARDIOMYOPATHY, SECONDARY  Atrial fibrillation  Diabetes mellitus type 2 with complications  Abscess, right groin  Right groin abscess S/p incision and drainage  Continue with vancomycin and zosyn Surgery  following  Atrial fibrillation HR is well controlled Continue coumadin, metoprolol, digoxin  Diabetes mellitus Continue sliding scale insulin  Hypertension BP elevated Continue metoprolol,  lisinopril Patient was taking coumadin at home , will restart at the dose of 0.1 mg po bid. She says she did not take more than  0.2 mg at home.       LOS: 3 days   [facility] Pager: [phone] 01/08/2012, 6:22 PM

## 2012-01-08 NOTE — Progress Notes (Signed)
1 Day Post-Op  Subjective: Sitting up in bed eating breakfast,  admits to being sore from surgery, but otherwise offers no new c/o.  Objective: Vital signs in last 24 hours: Temp:  [96.8 F (36 C)-98.6 F (37 C)] 98.6 F (37 C) (09/13 0500) Pulse Rate:  [73-87] 84  (09/13 0500) Resp:  [18-30] 18  (09/13 0500) BP: (114-165)/(51-88) 134/77 mmHg (09/13 0733) SpO2:  [92 %-100 %] 94 % (09/13 0500) Last BM Date: 01/06/12  Intake/Output from previous day: 09/12 0701 - 09/13 0700 In: 850 [I.V.:850] Out: -  Intake/Output this shift:    General appearance: alert, cooperative, appears stated age, no distress and moderately obese Chest: CTA Cardiac: RRR Surgical wound : No area of induration or erythema; outer dressing was changed only per Dr. Dixon Boos request, packing was left in place. Outer dressing was saturated with old SS drainage, no odor was noted. Wound was redressed with 4x4's and abd pads.  WBC have trended downward toward normal limit H&H is stable. VSS, afebrile.  Lab Results:   Basename 01/08/12 0616 01/07/12 0600  WBC 10.9* 14.1*  HGB 11.3* 11.9*  HCT 33.9* 34.9*  PLT 210 181   BMET  Basename 01/07/12 0600 01/06/12 0455  NA 137 134*  K 3.5 4.2  CL 99 97  CO2 28 28  GLUCOSE 117* 112*  BUN 9 11  CREATININE 0.64 0.80  CALCIUM 9.5 9.6   PT/INR  Basename 01/08/12 0616 01/07/12 0934  LABPROT 17.6* 16.9*  INR 1.42 1.35   ABG No results found for this basename: PHART:2,PCO2:2,PO2:2,HCO3:2 in the last 72 hours  Studies/Results: No results found.  Anti-infectives: Anti-infectives     Start     Dose/Rate Route Frequency Ordered Stop   01/06/12 1800  piperacillin-tazobactam (ZOSYN) IVPB 3.375 g       3.375 g 12.5 mL/hr over 240 Minutes Intravenous 3 times per day 01/06/12 1627     01/05/12 2200   piperacillin-tazobactam (ZOSYN) IVPB 3.375 g  Status:  Discontinued        3.375 g 12.5 mL/hr over 240 Minutes Intravenous 3 times per day 01/05/12 1947 01/05/12  1951   01/05/12 2200   piperacillin-tazobactam (ZOSYN) IVPB 3.375 g  Status:  Discontinued        3.375 g 12.5 mL/hr over 240 Minutes Intravenous 3 times per day 01/05/12 1952 01/06/12 1627   01/05/12 2100   vancomycin (VANCOCIN) 1,250 mg in sodium chloride 0.9 % 250 mL IVPB        1,250 mg 166.7 mL/hr over 90 Minutes Intravenous Every 24 hours 01/05/12 2023            Assessment/Plan: s/p Procedure(s) (LRB) with comments: INCISION AND DRAINAGE ABSCESS (Right)  Patient Active Problem List  Diagnosis  . COLONIC POLYPS, HYPERPLASTIC  . BENIGN NEOPLASM OTH&UNSPEC SITE DIGESTIVE SYSTEM  . UNSPECIFIED VITAMIN D DEFICIENCY  . HYPERLIPIDEMIA  . OVERWEIGHT/OBESITY  . HYPERTENSION  . CARDIOMYOPATHY, SECONDARY  . Atrial fibrillation  . CONGESTIVE HEART FAILURE  . ALLERGIC RHINITIS  . ASTHMA, PERSISTENT, MODERATE  . STRICTURE, ESOPHAGEAL  . GERD  . HIATAL HERNIA  . DIVERTICULOSIS, COLON  . OSTEOARTHRITIS  . KNEE PAIN, RIGHT, CHRONIC  . HERNIATED LUMBAR DISC  . LOW BACK PAIN, CHRONIC  . FOOT PAIN, LEFT  . OSTEOPOROSIS  . FATIGUE, CHRONIC  . PERIPHERAL EDEMA  . COUGH  . Diabetes mellitus type 2 with complications  . Hx of Hematoma of leg, with chronic leg weakness and numbness  . Splenic  mass  . Spleen disorder  . Lower abdominal pain  . Constipation  . Abscess, right groin  1. Wound dressing changes prn (exterior dressing only today) 2. Continue with ABX 3. Management of her other medical issues per medicine team. 4. Wound packing will be changed out tomorrow 01/09/12 per Dr. Lindie Spruce 5. Follow labs 6. OOB   LOS: 3 days    Angela Ali 01/08/2012

## 2012-01-09 LAB — PROTIME-INR: Prothrombin Time: 21 seconds — ABNORMAL HIGH (ref 11.6–15.2)

## 2012-01-09 LAB — GLUCOSE, CAPILLARY
Glucose-Capillary: 109 mg/dL — ABNORMAL HIGH (ref 70–99)
Glucose-Capillary: 127 mg/dL — ABNORMAL HIGH (ref 70–99)

## 2012-01-09 MED ORDER — SULFAMETHOXAZOLE-TRIMETHOPRIM 800-160 MG PO TABS: 1.0000 | ORAL_TABLET | Freq: Two times a day (BID) | ORAL | Status: AC

## 2012-01-09 MED ORDER — HYDROMORPHONE HCL PF 1 MG/ML IJ SOLN
INTRAMUSCULAR | Status: AC
  Filled 2012-01-09: qty 1

## 2012-01-09 MED ORDER — HYDROCODONE-ACETAMINOPHEN 5-325 MG PO TABS: 1.0000 | ORAL_TABLET | Freq: Four times a day (QID) | ORAL | Status: AC | PRN

## 2012-01-09 MED ORDER — CLONIDINE HCL 0.1 MG PO TABS: 0.1000 mg | ORAL_TABLET | Freq: Two times a day (BID) | ORAL | Status: DC

## 2012-01-09 MED ORDER — WARFARIN SODIUM 5 MG PO TABS
5.0000 mg | ORAL_TABLET | Freq: Once | ORAL | Status: DC
  Filled 2012-01-09: qty 1

## 2012-01-09 MED ORDER — HYDROMORPHONE HCL PF 1 MG/ML IJ SOLN
1.0000 mg | Freq: Once | INTRAMUSCULAR | Status: AC
  Administered 2012-01-09: 1 mg via INTRAVENOUS

## 2012-01-09 NOTE — Progress Notes (Signed)
ANTICOAGULATION CONSULT NOTE - Follow Up Consult  Pharmacy Consult for warfarin Indication: atrial fibrillation history  Allergies  Allergen Reactions  . Percocet (Oxycodone-Acetaminophen) Nausea Only       . Tramadol Nausea Only    Patient Measurements: Height: 5' 2.6" (159 cm) Weight: 173 lb (78.472 kg) IBW/kg (Calculated) : 51.48   Vital Signs: Temp: 99.1 F (37.3 C) (09/14 0608) Temp src: Oral (09/14 0608) BP: 150/87 mmHg (09/14 0608) Pulse Rate: 80  (09/14 0608)  Labs:  Basename 01/09/12 0600 01/08/12 0616 01/07/12 0934 01/07/12 0600  HGB -- 11.3* -- 11.9*  HCT -- 33.9* -- 34.9*  PLT -- 210 -- 181  APTT -- -- -- --  LABPROT 21.0* 17.6* 16.9* --  INR 1.78* 1.42 1.35 --  HEPARINUNFRC -- -- -- --  CREATININE -- -- -- 0.64  CKTOTAL -- -- -- --  CKMB -- -- -- --  TROPONINI -- -- -- --    Estimated Creatinine Clearance: 55.2 ml/min (by C-G formula based on Cr of 0.64).   Medications:  Scheduled:    . atorvastatin  80 mg Oral q1800  . cloNIDine  0.1 mg Oral BID  . digoxin  0.125 mg Oral Daily  . diltiazem  30 mg Oral Q6H  . HYDROcodone-acetaminophen  1 tablet Oral Q6H  . insulin aspart  0-15 Units Subcutaneous TID WC  . insulin aspart  0-5 Units Subcutaneous QHS  . lisinopril  40 mg Oral Daily  . metoprolol succinate  200 mg Oral Daily  . pantoprazole  40 mg Oral Q1200  . piperacillin-tazobactam (ZOSYN)  IV  3.375 g Intravenous Q8H  . sodium chloride  3 mL Intravenous Q12H  . vancomycin  1,250 mg Intravenous Q24H  . warfarin  6 mg Oral ONCE-1800  . Warfarin - Pharmacist Dosing Inpatient   Does not apply q1800    Assessment: 82 YOF on warfarin PTA for history of AFib. Home regimen confirmed with family is 5mg  po daily except 2.5mg  TTSat. Warfarin had been held for I&D, and then resumed on 9/12. INR today is subtherapeutic at 1.78 after receiving 4mg  on 9/12 and 6mg  on 9/13. Hgb stable, platelets stable, no bleeding reported.  Goal of Therapy:  INR  2-3 Monitor platelets by anticoagulation protocol: Yes   Plan:  1. Warfarin 5mg  po x1 tonight 2. Daily PT/INR 3. Continue to follow clinical progression and labs and will adjust as needed  Othella Slappey D. Anthon Harpole, PharmD Clinical Pharmacist Pager: [phone] Phone: [phone] 01/09/2012 9:17 AM

## 2012-01-09 NOTE — Discharge Summary (Signed)
[facility]                                                                                   Angela Ali, is a 78 y.o. female  DOB 16-May-1928  MRN [id number].  Admission date:  01/05/2012  Discharge Date:  01/09/2012  Primary MD  Kerby Nora, MD  Admitting Physician  Gery Pray, MD  Admission Diagnosis  abcess of groin Right thigh abscess  Discharge Diagnosis     Active Problems:  HYPERLIPIDEMIA  HYPERTENSION  CARDIOMYOPATHY, SECONDARY  Atrial fibrillation  Diabetes mellitus type 2 with complications  Abscess, right groin  s/o incision and drainage of the right groin abscess  Past Medical History  Diagnosis Date  . Overweight   . Secondary cardiomyopathy, unspecified   . Atrial flutter   . Congestive heart failure, unspecified   . Edema   . Other and unspecified hyperlipidemia   . Unspecified essential hypertension   . Allergic rhinitis, cause unspecified   . Colonic polyp   . Diverticulosis of colon (without mention of hemorrhage)   . Other malaise and fatigue   . Stricture and stenosis of esophagus   . Bacterial pneumonia, unspecified   . Other abnormal glucose   . Esophageal reflux   . Osteoarthrosis, unspecified whether generalized or localized, unspecified site   . Displacement of lumbar intervertebral disc without myelopathy   . Diaphragmatic hernia without mention of obstruction or gangrene   . Encounter for long-term (current) use of anticoagulants   . Atrial fibrillation   . Prediabetes   . Splenic mass 08/21/2011    Past Surgical History  Procedure Date  . Esophageal dilation 01/2007  . Abdominal hysterectomy 1971    partial  . Ankle surgery 2001  . Cholecystectomy 2004     Recommendations for primary care physician for things to follow:   Follow PT/INR in three days  Follow culture results from the abscess  Discharge Condition: Stable    Diet recommendation: See Discharge Instructions below   Consults    surgery   History of present illness and  Hospital Course:     This is arousable pleasant 78 year old female who states she developed mass/abscess on the right inner thigh on Friday. Is been warm and painful. She denies any fevers but reports chills. She denies any nausea or vomiting. She states she's had difficulty walking because the ear or rubs and causes discomfort. She has had abscesses in the past perhaps 2 that had to be I&D. No history of MRSA. She saw her PCP today who sent her over for direct admission for possible I&D of an abscess. History provided by patient and family members are at the bedside   Right groin abscess Patient was started on iv vancomycin and zosyn, wbc improved with antibiotics.She was also seen by surgery, and underwent incision and drainage of the right groin abscess. At this time patient's WBC is normal, and the wound has been reapacked by surgery.  Patient will be discharged on po bactrim for 10 days and she will follow up with Surgery on 9/16  Atrial fibrillation Patient's HR was controlled, she was taken off warfarin for the  I&D, at this time the patient is back on warfarin. Her INR is 1.78, and sub therapeutic. She will need to follow PCP for blood work on Monday and Friday  Diabetes mellitus Diet controlled..  Today   Subjective:   Natally Ribera today has no headache,no chest abdominal pain,no new weakness tingling or numbness, wound was repacked by surgery, feels much better wants to go home today.   Objective:   Blood pressure 134/69, pulse 75, temperature 99.1 F (37.3 C), temperature source Oral, resp. rate 18, height 5' 2.6" (1.59 m), weight 78.472 kg (173 lb), SpO2 95.00%.   Intake/Output Summary (Last 24 hours) at 01/09/12 1422 Last data filed at 01/09/12 1323  Gross per 24 hour  Intake    719 ml  Output      0 ml  Net    719 ml    Exam Awake Alert, Oriented *3, No new F.N deficits, Normal affect [place].AT,PERRAL Supple Neck,No JVD, No  cervical lymphadenopathy appriciated.  Symmetrical Chest wall movement, Good air movement bilaterally, CTAB RRR,No Gallops,Rubs or new Murmurs, No Parasternal Heave +ve B.Sounds, Abd Soft, Non tender, No organomegaly appriciated, No rebound -guarding or rigidity. No Cyanosis, Clubbing or edema, No new Rash or bruise  Data Review   Major procedures and Radiology Reports -  S/p Incision and drainage of the right groin abscess   [place] Extrem Low Right Ltd  01/06/2012  *RADIOLOGY REPORT*  Clinical Data: Evaluate right inner thigh swelling and pain.  ULTRASOUND RIGHT LOWER EXTREMITY LIMITED  Technique:  Ultrasound examination of the right thighwas performed at the area of concern.  Comparison:  None.  Findings: There is diffuse subcutaneous edema. There is a small hypoechoic lymph node in the right groin measuring up to 1.2 cm. No evidence for a focal fluid collection.  IMPRESSION: Diffuse subcutaneous edema in the right inner thigh.  No evidence for a fluid collection.   Original Report Authenticated By: Richarda Overlie, M.D.     Micro Results     Recent Results (from the past 240 hour(s))  CULTURE, BLOOD (ROUTINE X 2)     Status: Normal (Preliminary result)   Collection Time   01/05/12  9:25 PM      Component Value Range Status Comment   Specimen Description BLOOD LEFT HAND   Final    Special Requests BOTTLES DRAWN AEROBIC AND ANAEROBIC 5CC   Final    Culture  Setup Time 01/06/2012 05:50   Final    Culture     Final    Value:        BLOOD CULTURE RECEIVED NO GROWTH TO DATE CULTURE WILL BE HELD FOR 5 DAYS BEFORE ISSUING A FINAL NEGATIVE REPORT   Report Status PENDING   Incomplete   CULTURE, BLOOD (ROUTINE X 2)     Status: Normal (Preliminary result)   Collection Time   01/05/12  9:40 PM      Component Value Range Status Comment   Specimen Description BLOOD RIGHT ARM   Final    Special Requests BOTTLES DRAWN AEROBIC AND ANAEROBIC 10CC   Final    Culture  Setup Time 01/06/2012 05:50   Final     Culture     Final    Value:        BLOOD CULTURE RECEIVED NO GROWTH TO DATE CULTURE WILL BE HELD FOR 5 DAYS BEFORE ISSUING A FINAL NEGATIVE REPORT   Report Status PENDING   Incomplete   SURGICAL PCR SCREEN     Status:  Normal   Collection Time   01/07/12  7:52 AM      Component Value Range Status Comment   MRSA, PCR NEGATIVE  NEGATIVE Final    Staphylococcus aureus NEGATIVE  NEGATIVE Final   ANAEROBIC CULTURE     Status: Normal (Preliminary result)   Collection Time   01/07/12 10:58 AM      Component Value Range Status Comment   Specimen Description ABSCESS THIGH RIGHT   Final    Special Requests NONE   Final    Gram Stain PENDING   Incomplete    Culture     Final    Value: NO ANAEROBES ISOLATED; CULTURE IN PROGRESS FOR 5 DAYS   Report Status PENDING   Incomplete   CULTURE, ROUTINE-ABSCESS     Status: Normal (Preliminary result)   Collection Time   01/07/12 10:58 AM      Component Value Range Status Comment   Specimen Description ABSCESS THIGH RIGHT   Final    Special Requests NONE   Final    Gram Stain     Final    Value: MODERATE WBC PRESENT,BOTH PMN AND MONONUCLEAR     NO SQUAMOUS EPITHELIAL CELLS SEEN     FEW GRAM POSITIVE COCCI     IN PAIRS FEW GRAM VARIABLE ROD   Culture Culture reincubated for better growth   Final    Report Status PENDING   Incomplete      CBC w Diff: Lab Results  Component Value Date   WBC 10.9* 01/08/2012   WBC 9.4 09/30/2011   HGB 11.3* 01/08/2012   HGB 13.7 09/30/2011   HCT 33.9* 01/08/2012   HCT 40.1 09/30/2011   PLT 210 01/08/2012   PLT 161 09/30/2011   LYMPHOPCT 28.3 12/01/2011   LYMPHOPCT 20.6 09/30/2011   MONOPCT 4.6 12/01/2011   MONOPCT 7.4 09/30/2011   EOSPCT 2.4 12/01/2011   EOSPCT 2.7 09/30/2011   BASOPCT 0.3 12/01/2011   BASOPCT 0.2 09/30/2011    CMP: Lab Results  Component Value Date   NA 137 01/07/2012   K 3.5 01/07/2012   CL 99 01/07/2012   CO2 28 01/07/2012   BUN 9 01/07/2012   CREATININE 0.64 01/07/2012   PROT 7.5 01/05/2012   ALBUMIN 3.3*  01/05/2012   BILITOT 0.6 01/05/2012   ALKPHOS 91 01/05/2012   AST 10 01/05/2012   ALT 8 01/05/2012  .   Discharge Instructions       Follow-up Information    Follow up with Cherylynn Ridges, MD. On 01/11/2012. (You will be seen in the Urgent office by DR. Timmothy Sours need to be at  the office by 2:15 pm.)    Contact information:   [address] [place] [address] [phone]       Follow up with Kerby Nora, MD. In 2 weeks.   Contact information:   [address] [employer] [address] [place] [address] [phone]            Discharge Medications     Medication List     As of 01/09/2012  2:22 PM    START taking these medications         HYDROcodone-acetaminophen 5-325 MG per tablet   Commonly known as: NORCO/VICODIN   Take 1 tablet by mouth every 6 (six) hours as needed for pain.      sulfamethoxazole-trimethoprim 800-160 MG per tablet   Commonly known as:  BACTRIM DS,SEPTRA DS   Take 1 tablet by mouth 2 (two) times daily.      CHANGE how you take these medications         cloNIDine 0.1 MG tablet   Commonly known as: CATAPRES   Take 1 tablet (0.1 mg total) by mouth 2 (two) times daily.   What changed: - medication strength - dose      CONTINUE taking these medications         atorvastatin 80 MG tablet   Commonly known as: LIPITOR      benzonatate 100 MG capsule   Commonly known as: TESSALON      digoxin 0.125 MG tablet   Commonly known as: LANOXIN      diltiazem 30 MG tablet   Commonly known as: CARDIZEM      fluorometholone 0.1 % ophthalmic suspension   Commonly known as: FML      furosemide 80 MG tablet   Commonly known as: LASIX      lisinopril 40 MG tablet   Commonly known as: PRINIVIL,ZESTRIL   Take 1 tablet (40 mg total) by mouth daily.      metoprolol 200 MG 24 hr tablet   Commonly known as: TOPROL-XL      omeprazole 20 MG capsule   Commonly known as: PRILOSEC       VITAMIN C PO      VITAMIN D-3 PO      * warfarin 2.5 MG tablet   Commonly known as: COUMADIN      * warfarin 5 MG tablet   Commonly known as: COUMADIN     * Notice: This list has 2 medication(s) that are the same as other medications prescribed for you. Read the directions carefully, and ask your doctor or other care provider to review them with you.        Where to get your medications    These are the prescriptions that you need to pick up.   You may get these medications from any pharmacy.         cloNIDine 0.1 MG tablet   HYDROcodone-acetaminophen 5-325 MG per tablet   sulfamethoxazole-trimethoprim 800-160 MG per tablet               Total Time in preparing paper work, data evaluation and todays exam - 35 minutes  Salem Lembke S M.D on 01/09/2012 at 2:22 PM  [facility] Office  [phone]

## 2012-01-09 NOTE — Progress Notes (Signed)
2 Days Post-Op  Subjective: PT with no c/o.  Packing removed today and repacked.    Objective: Vital signs in last 24 hours: Temp:  [98.4 F (36.9 C)-99.1 F (37.3 C)] 99.1 F (37.3 C) (09/14 0608) Pulse Rate:  [75-97] 75  (09/14 0956) Resp:  [18] 18  (09/13 2136) BP: (108-175)/(65-97) 146/87 mmHg (09/14 0956) SpO2:  [95 %-98 %] 95 % (09/14 0608) Last BM Date: 01/08/12  Intake/Output from previous day: 09/13 0701 - 09/14 0700 In: 3 [I.V.:3] Out: -  Intake/Output this shift: Total I/O In: 719 [P.O.:716; I.V.:3] Out: -   Ext: wnd c/d/i  Lab Results:   Basename 01/08/12 0616 01/07/12 0600  WBC 10.9* 14.1*  HGB 11.3* 11.9*  HCT 33.9* 34.9*  PLT 210 181   BMET  Basename 01/07/12 0600  NA 137  K 3.5  CL 99  CO2 28  GLUCOSE 117*  BUN 9  CREATININE 0.64  CALCIUM 9.5   PT/INR  Basename 01/09/12 0600 01/08/12 0616  LABPROT 21.0* 17.6*  INR 1.78* 1.42   ABG No results found for this basename: PHART:2,PCO2:2,PO2:2,HCO3:2 in the last 72 hours  Studies/Results: No results found.  Anti-infectives: Anti-infectives     Start     Dose/Rate Route Frequency Ordered Stop   01/06/12 1800   piperacillin-tazobactam (ZOSYN) IVPB 3.375 g        3.375 g 12.5 mL/hr over 240 Minutes Intravenous 3 times per day 01/06/12 1627     01/05/12 2200   piperacillin-tazobactam (ZOSYN) IVPB 3.375 g  Status:  Discontinued        3.375 g 12.5 mL/hr over 240 Minutes Intravenous 3 times per day 01/05/12 1947 01/05/12 1951   01/05/12 2200   piperacillin-tazobactam (ZOSYN) IVPB 3.375 g  Status:  Discontinued        3.375 g 12.5 mL/hr over 240 Minutes Intravenous 3 times per day 01/05/12 1952 01/06/12 1627   01/05/12 2100   vancomycin (VANCOCIN) 1,250 mg in sodium chloride 0.9 % 250 mL IVPB        1,250 mg 166.7 mL/hr over 90 Minutes Intravenous Every 24 hours 01/05/12 2023            Assessment/Plan: s/p Procedure(s) (LRB) with comments: INCISION AND DRAINAGE ABSCESS  (Right) -repack wound, and educate family -will need BID dressign changes at home. -OK to shower -if d/cing home with pain rx and abx -f/u x 1 week    LOS: 4 days    Marigene Ehlers., [facility] 01/09/2012

## 2012-01-10 LAB — CULTURE, ROUTINE-ABSCESS

## 2012-01-11 ENCOUNTER — Ambulatory Visit (INDEPENDENT_AMBULATORY_CARE_PROVIDER_SITE_OTHER): Payer: Medicare PPO | Admitting: General Surgery

## 2012-01-11 ENCOUNTER — Telehealth (INDEPENDENT_AMBULATORY_CARE_PROVIDER_SITE_OTHER): Payer: Self-pay

## 2012-01-11 ENCOUNTER — Encounter (INDEPENDENT_AMBULATORY_CARE_PROVIDER_SITE_OTHER): Payer: Self-pay | Admitting: General Surgery

## 2012-01-11 VITALS — BP 148/86 | HR 74 | Temp 97.6°F | Resp 16 | Ht 65.0 in | Wt 185.2 lb

## 2012-01-11 DIAGNOSIS — L0390 Cellulitis, unspecified: Secondary | ICD-10-CM

## 2012-01-11 DIAGNOSIS — L0291 Cutaneous abscess, unspecified: Secondary | ICD-10-CM

## 2012-01-11 NOTE — Telephone Encounter (Signed)
Patient calling for culture results obtained while at the hospital on 01/07/12.

## 2012-01-11 NOTE — Patient Instructions (Signed)
Continue to pack the wound twice a day as long as there is a deep hole. If the wound becomes very shallow just put a gauze bandage on top.  Take a shower once or twice a day.  Take the antibiotics until they are all gone.  You will be given an appointment to see Dr. Lindie Spruce in approximately 2 weeks.

## 2012-01-11 NOTE — Progress Notes (Signed)
Patient ID: Angela Ali, female   DOB: [birth year], 78 y.o.   MRN: [id number] History: This 78 year old woman underwent incision and drainage of a right groin abscess by Dr. Lindie Spruce on September 12. She was discharged home on September 14 on Bactrim. Cultures are showing multiple species. Her daughter is changing the bandage twice a day.  Exam: Patient looks well. No distress. Right groin wound is examined. Packing removed. Repacked with saline fine mesh gauze. There is no purulence. . Minimal tenderness. No odor. No skin necrosis.  Assessment: Bacterial abscess right groin. This appears adequately drained. The infection under control.  Plan: Continue antibiotics. Continue twice a day dressing changes Return to see Dr. Lindie Spruce in 2 weeks.   Angela Ali. Derrell Lolling, M.D., [facility] General and Minimally invasive Surgery Breast and Colorectal Surgery Office:   [phone] Pager:   [phone]

## 2012-01-12 ENCOUNTER — Telehealth (INDEPENDENT_AMBULATORY_CARE_PROVIDER_SITE_OTHER): Payer: Self-pay | Admitting: General Surgery

## 2012-01-12 ENCOUNTER — Ambulatory Visit: Payer: Medicare PPO

## 2012-01-12 ENCOUNTER — Telehealth (INDEPENDENT_AMBULATORY_CARE_PROVIDER_SITE_OTHER): Payer: Self-pay

## 2012-01-12 ENCOUNTER — Ambulatory Visit (INDEPENDENT_AMBULATORY_CARE_PROVIDER_SITE_OTHER): Payer: Medicare PPO | Admitting: Family Medicine

## 2012-01-12 DIAGNOSIS — Z7902 Long term (current) use of antithrombotics/antiplatelets: Secondary | ICD-10-CM

## 2012-01-12 DIAGNOSIS — I4891 Unspecified atrial fibrillation: Secondary | ICD-10-CM

## 2012-01-12 DIAGNOSIS — Z452 Encounter for adjustment and management of vascular access device: Secondary | ICD-10-CM

## 2012-01-12 LAB — CULTURE, BLOOD (ROUTINE X 2)

## 2012-01-12 LAB — ANAEROBIC CULTURE

## 2012-01-12 NOTE — Telephone Encounter (Signed)
I called patient after having Dr. Luisa Hart review the results. There is no MRSA or Staph. She needs to finish the Bactrim. Patient understood and will follow up with Dr. Lindie Spruce on 10/1.

## 2012-01-12 NOTE — Patient Instructions (Signed)
Hold 2 days then 2.5 daily. Recheck Friday 

## 2012-01-12 NOTE — Telephone Encounter (Signed)
Called patient back to advise that the results from the lab have not been received yet. Advised the culture has to develop in the lab and as soon as the results are received she will be contacted. The patient agreed.

## 2012-01-12 NOTE — Telephone Encounter (Signed)
Patient would like culture results from hospital visit on 01/07/12.

## 2012-01-15 ENCOUNTER — Ambulatory Visit: Payer: Medicare PPO

## 2012-01-26 ENCOUNTER — Encounter (INDEPENDENT_AMBULATORY_CARE_PROVIDER_SITE_OTHER): Payer: Self-pay | Admitting: General Surgery

## 2012-01-26 ENCOUNTER — Ambulatory Visit (INDEPENDENT_AMBULATORY_CARE_PROVIDER_SITE_OTHER): Payer: Medicare PPO | Admitting: General Surgery

## 2012-01-26 VITALS — BP 132/80 | HR 72 | Temp 98.0°F | Resp 18 | Ht 65.0 in | Wt 173.0 lb

## 2012-01-26 DIAGNOSIS — S31109A Unspecified open wound of abdominal wall, unspecified quadrant without penetration into peritoneal cavity, initial encounter: Secondary | ICD-10-CM | POA: Insufficient documentation

## 2012-01-26 DIAGNOSIS — Z09 Encounter for follow-up examination after completed treatment for conditions other than malignant neoplasm: Secondary | ICD-10-CM | POA: Insufficient documentation

## 2012-01-26 NOTE — Progress Notes (Signed)
The patient is doing well status post incision and drainage of right groin and vulvar abscess. It has been granulating well and bleeding occasionally but no pus. The been doing twice a day wet-to-dry dressings with normal saline. I recommended based on my examination today that they go to once a day along with shower head washing out of the area please once a day.  The patient reports some discomfort on the opposite side but I cannot detect a my examination in the evidence of infection. I'll see her back in about 3 weeks for reexamination at that time I think it would be closed down significantly currently it measures approximately 2.5 x 2.5 x 2 cm deep. I repacked it with a saline soaked sterile gauze and applied an outer dressing.

## 2012-02-02 ENCOUNTER — Ambulatory Visit: Payer: 59

## 2012-02-04 ENCOUNTER — Encounter: Payer: Self-pay | Admitting: Family Medicine

## 2012-02-04 ENCOUNTER — Ambulatory Visit: Payer: Self-pay

## 2012-02-04 ENCOUNTER — Ambulatory Visit (INDEPENDENT_AMBULATORY_CARE_PROVIDER_SITE_OTHER): Payer: Medicare PPO | Admitting: Family Medicine

## 2012-02-04 VITALS — BP 142/80 | HR 58 | Temp 98.3°F | Wt 176.8 lb

## 2012-02-04 DIAGNOSIS — N9089 Other specified noninflammatory disorders of vulva and perineum: Secondary | ICD-10-CM

## 2012-02-04 DIAGNOSIS — I10 Essential (primary) hypertension: Secondary | ICD-10-CM

## 2012-02-04 DIAGNOSIS — E785 Hyperlipidemia, unspecified: Secondary | ICD-10-CM

## 2012-02-04 DIAGNOSIS — I509 Heart failure, unspecified: Secondary | ICD-10-CM

## 2012-02-04 DIAGNOSIS — N907 Vulvar cyst: Secondary | ICD-10-CM | POA: Insufficient documentation

## 2012-02-04 DIAGNOSIS — Z7902 Long term (current) use of antithrombotics/antiplatelets: Secondary | ICD-10-CM

## 2012-02-04 DIAGNOSIS — Z452 Encounter for adjustment and management of vascular access device: Secondary | ICD-10-CM

## 2012-02-04 DIAGNOSIS — Z79899 Other long term (current) drug therapy: Secondary | ICD-10-CM

## 2012-02-04 DIAGNOSIS — E118 Type 2 diabetes mellitus with unspecified complications: Secondary | ICD-10-CM

## 2012-02-04 DIAGNOSIS — I4891 Unspecified atrial fibrillation: Secondary | ICD-10-CM

## 2012-02-04 DIAGNOSIS — L0291 Cutaneous abscess, unspecified: Secondary | ICD-10-CM

## 2012-02-04 LAB — COMPREHENSIVE METABOLIC PANEL
Alkaline Phosphatase: 82 U/L (ref 39–117)
BUN: 11 mg/dL (ref 6–23)
Glucose, Bld: 116 mg/dL — ABNORMAL HIGH (ref 70–99)
Sodium: 139 mEq/L (ref 135–145)
Total Bilirubin: 0.7 mg/dL (ref 0.3–1.2)
Total Protein: 7 g/dL (ref 6.0–8.3)

## 2012-02-04 LAB — LIPID PANEL
Cholesterol: 137 mg/dL (ref 0–200)
HDL: 40.5 mg/dL (ref >39.00)
VLDL: 24.2 mg/dL (ref 0.0–40.0)

## 2012-02-04 LAB — CBC WITH DIFFERENTIAL/PLATELET
Eosinophils Absolute: 0.2 10*3/uL (ref 0.0–0.7)
MCHC: 33.2 g/dL (ref 30.0–36.0)
MCV: 87.5 fl (ref 78.0–100.0)
Monocytes Absolute: 0.6 10*3/uL (ref 0.1–1.0)
Neutrophils Relative %: 60.4 % (ref 43.0–77.0)
Platelets: 197 10*3/uL (ref 150.0–400.0)

## 2012-02-04 LAB — POCT INR: INR: 4

## 2012-02-04 LAB — HEMOGLOBIN A1C: Hgb A1c MFr Bld: 6.3 % (ref 4.6–6.5)

## 2012-02-04 NOTE — Assessment & Plan Note (Signed)
euvolemic 

## 2012-02-04 NOTE — Assessment & Plan Note (Signed)
IMproved on recheck after sitting for a while. Follow at home. Overdue for cardiology follow up for chronic issues

## 2012-02-04 NOTE — Assessment & Plan Note (Signed)
No clear ongoin infection. Pt will perform warm compresses to see if it will drain/ reduce in size. Can apply neosporin ointment.

## 2012-02-04 NOTE — Patient Instructions (Signed)
Hold 2 days then 2.5 daily. Recheck Friday

## 2012-02-04 NOTE — Assessment & Plan Note (Signed)
Resolved abscess, wound healing.

## 2012-02-04 NOTE — Assessment & Plan Note (Signed)
Due for re-eval. 

## 2012-02-04 NOTE — Progress Notes (Signed)
Subjective:    Patient ID: Angela Ali, female    DOB: 27-Jun-1928, 78 y.o.   MRN: [id number]  HPI 78 year old female pt with recent groin abscess presents for routine 3 month follow up.  The right groin lesion is healing, wound still open.  She has noted new knot on left labia. No change in size, no tender, no redness, no discharge. Follow up with surgeon is 10/22.  Diabetes:  Due for re-eval with labs. On no medicaiton Lab Results  Component Value Date   HGBA1C 6.3 06/10/2011  Using medications without difficulties: Hypoglycemic episodes:None Hyperglycemic episodes:None Feet problems:None Blood Sugars averaging:112-120 eye exam within last year:yes, few weeks ago.  Hypertension:   Elevated today on toprol XL, lisinopril, lasix, cardiazem, catapres Using medication without problems or lightheadedness: None Chest pain with exertion:None Edema:None Short of breath:stable Average home BPs: Other issues: Afib , CHF, followed by Cardiology. Euvolemic on lasix daily.  Elevated Cholesterol:  Due for re-eval. On lipitor.  Lab Results  Component Value Date   CHOL 147 06/10/2011   HDL 51.30 06/10/2011   LDLCALC 80 06/10/2011   TRIG 80.0 06/10/2011   CHOLHDL 3 06/10/2011    Using medications without problems: Muscle aches:  Diet compliance: Exercise: Other complaints:    Review of Systems  Constitutional: Negative for fever and fatigue.  HENT: Negative for ear pain.   Eyes: Negative for pain.  Respiratory: Negative for chest tightness and shortness of breath.   Cardiovascular: Negative for chest pain, palpitations and leg swelling.  Gastrointestinal: Negative for abdominal pain.  Genitourinary: Negative for dysuria.       Objective:   Physical Exam  Constitutional: She is oriented to person, place, and time. Vital signs are normal. She appears well-developed and well-nourished. She is cooperative.  Non-toxic appearance. She does not appear ill. No distress.   Overweight elderly female in NAD  HENT:  Head: Normocephalic.  Right Ear: Hearing, tympanic membrane, external ear and ear canal normal. Tympanic membrane is not erythematous, not retracted and not bulging.  Left Ear: Hearing, tympanic membrane, external ear and ear canal normal. Tympanic membrane is not erythematous, not retracted and not bulging.  Nose: No mucosal edema or rhinorrhea. Right sinus exhibits no maxillary sinus tenderness and no frontal sinus tenderness. Left sinus exhibits no maxillary sinus tenderness and no frontal sinus tenderness.  Mouth/Throat: Uvula is midline, oropharynx is clear and moist and mucous membranes are normal.  Eyes: Conjunctivae normal, EOM and lids are normal. Pupils are equal, round, and reactive to light. No foreign bodies found.  Neck: Trachea normal and normal range of motion. Neck supple. Carotid bruit is not present. No mass and no thyromegaly present.  Cardiovascular: Normal rate, regular rhythm, S1 normal, S2 normal, normal heart sounds, intact distal pulses and normal pulses.  Exam reveals no gallop and no friction rub.   No murmur heard. Pulmonary/Chest: Effort normal and breath sounds normal. Not tachypneic. No respiratory distress. She has no decreased breath sounds. She has no wheezes. She has no rhonchi. She has no rales.  Abdominal: Soft. Normal appearance and bowel sounds are normal. There is no tenderness.  Genitourinary:       Left labia lima bean size mobile cyst/ sebaceous accumulation.. No erythema no pain, no discharge. Right groin wound covered.  Neurological: She is alert and oriented to person, place, and time.  Skin: Skin is warm, dry and intact. No rash noted.  Psychiatric: Her speech is normal and behavior is normal. Judgment  and thought content normal. Her mood appears not anxious. Cognition and memory are normal. She does not exhibit a depressed mood.          Assessment & Plan:

## 2012-02-04 NOTE — Patient Instructions (Addendum)
Stop at lab on your way out for fasting labs today. Follow up in 3 months for medicare wellness with fasting labs prior. Area on left labia does not appear infected but is a blocked pore/cyst. Can apply neosporin and do daily warm washcloth compresses.   You are 1 month overdue for  6 month cardiology follow up... Call to set that appt up.

## 2012-02-09 ENCOUNTER — Telehealth (INDEPENDENT_AMBULATORY_CARE_PROVIDER_SITE_OTHER): Payer: Self-pay | Admitting: General Surgery

## 2012-02-09 NOTE — Telephone Encounter (Signed)
Patient calling to state the packing is no longer staying in the leg wound. She wanted Dr Lindie Spruce to check this week. I advised her to keep appt for next week and not to pack the area anymore- just to keep covered with dry gauze and change daily.

## 2012-02-10 ENCOUNTER — Ambulatory Visit (INDEPENDENT_AMBULATORY_CARE_PROVIDER_SITE_OTHER): Payer: Medicare PPO | Admitting: Family Medicine

## 2012-02-10 DIAGNOSIS — Z452 Encounter for adjustment and management of vascular access device: Secondary | ICD-10-CM

## 2012-02-10 DIAGNOSIS — I4891 Unspecified atrial fibrillation: Secondary | ICD-10-CM

## 2012-02-10 DIAGNOSIS — Z7902 Long term (current) use of antithrombotics/antiplatelets: Secondary | ICD-10-CM

## 2012-02-10 LAB — POCT INR: INR: 1.8

## 2012-02-10 NOTE — Patient Instructions (Signed)
2.5 daily.check in 2 weeks

## 2012-02-12 ENCOUNTER — Ambulatory Visit: Payer: Medicare PPO

## 2012-02-16 ENCOUNTER — Encounter (INDEPENDENT_AMBULATORY_CARE_PROVIDER_SITE_OTHER): Payer: Self-pay | Admitting: General Surgery

## 2012-02-16 ENCOUNTER — Ambulatory Visit (INDEPENDENT_AMBULATORY_CARE_PROVIDER_SITE_OTHER): Payer: Medicare PPO | Admitting: General Surgery

## 2012-02-16 VITALS — BP 138/70 | HR 56 | Temp 97.2°F | Resp 16 | Ht 64.5 in | Wt 178.6 lb

## 2012-02-16 DIAGNOSIS — N750 Cyst of Bartholin's gland: Secondary | ICD-10-CM | POA: Insufficient documentation

## 2012-02-16 NOTE — Progress Notes (Signed)
The patient's infected wound in her right medial thigh has completely healed. There is a small invagination manner. She also complains of a lesion on the inner portion of her left vaginal labia. This appears to be a Bartholin cyst. I would refer her to a GYN or her primary care physician for evaluation and treatment.  The patient requires no further treatment from our office. She will return to see me on a when necessary basis.

## 2012-02-18 ENCOUNTER — Ambulatory Visit (INDEPENDENT_AMBULATORY_CARE_PROVIDER_SITE_OTHER): Payer: Medicare PPO | Admitting: Family Medicine

## 2012-02-18 ENCOUNTER — Encounter: Payer: Self-pay | Admitting: Family Medicine

## 2012-02-18 VITALS — BP 150/80 | HR 50 | Temp 98.6°F | Wt 177.0 lb

## 2012-02-18 DIAGNOSIS — N750 Cyst of Bartholin's gland: Secondary | ICD-10-CM

## 2012-02-18 DIAGNOSIS — Z23 Encounter for immunization: Secondary | ICD-10-CM

## 2012-02-18 NOTE — Addendum Note (Signed)
Addended by: Kerby Nora E on: 02/18/2012 03:37 PM   Modules accepted: Orders

## 2012-02-18 NOTE — Progress Notes (Signed)
  Subjective:    Patient ID: Angela Ali, female    DOB: Aug 28, 1928, 78 y.o.   MRN: [id number]  HPI 78 year old female with recetn hospitalization for I and D of left groin abscess presents for evaluation of cyst in left labia. Dr. Lindie Spruce surgeon look at the lesion 2 days ago. He felt that it was a Bartholin's cyst.  She reports that the area feels heavy. Non tender, no discharge, no redness.   Review of Systems  Constitutional: Negative for fever and fatigue.  HENT: Negative for ear pain.   Eyes: Negative for pain.  Respiratory: Negative for chest tightness and shortness of breath.   Cardiovascular: Negative for chest pain, palpitations and leg swelling.  Gastrointestinal: Negative for abdominal pain.  Genitourinary: Negative for dysuria.       Objective:   Physical Exam  Constitutional: She appears well-developed.  Eyes: Pupils are equal, round, and reactive to light.  Cardiovascular: An irregular rhythm present.  No murmur heard. Pulmonary/Chest: Effort normal and breath sounds normal.  Abdominal: Soft. Bowel sounds are normal. She exhibits no distension.  Genitourinary:          Nontender, yellowish appearing firm cyst, no discharge, no increased warmth.          Assessment & Plan:

## 2012-02-18 NOTE — Assessment & Plan Note (Signed)
This cyst is close to area of bartholins gland, but somewhat superficial in my opinion.  She is fairly asymptomatic and she may not need any further treatment. No sign of abscess. Given her age > 4.Marland Kitchen There is more risk of carcinoma in B gland but this is rare. We will refer to GYN for further discussion of whether any treatment is necessary.

## 2012-02-18 NOTE — Addendum Note (Signed)
Addended by: Sueanne Margarita on: 02/18/2012 12:24 PM   Modules accepted: Orders

## 2012-02-18 NOTE — Patient Instructions (Signed)
Stop at front desk for referral to GYN.  

## 2012-02-19 ENCOUNTER — Telehealth: Payer: Self-pay | Admitting: Family Medicine

## 2012-02-19 DIAGNOSIS — E876 Hypokalemia: Secondary | ICD-10-CM

## 2012-02-19 MED ORDER — POTASSIUM CHLORIDE CRYS ER 20 MEQ PO TBCR: 20.0000 meq | EXTENDED_RELEASE_TABLET | Freq: Every day | ORAL | Status: DC

## 2012-02-19 NOTE — Telephone Encounter (Signed)
Message copied by Excell Seltzer on Fri Feb 19, 2012  5:09 PM ------      Message from: Baldomero Lamy      Created: Fri Feb 19, 2012 11:55 AM       Spoke with pt and advised her of her lab results. She is not taking any potassium supplement, she never has.       Pt advised I would get that info to Dr and call her back with whether or not she needs to take anything, She says we can leave any instructions on her voicemail, if we don't reach her.

## 2012-02-19 NOTE — Telephone Encounter (Signed)
She is on lasix which would lower potassium. Will have her start low dose potassium and plan on her staying on this while she is on lasix.. Will call into pharmacy. Hve pt come in for potassium recehck in 4-5 days.

## 2012-02-19 NOTE — Telephone Encounter (Signed)
Patient notified as instructed by telephone. Pt will ck lab on 02/24/12.

## 2012-02-23 ENCOUNTER — Encounter: Payer: Self-pay | Admitting: Family Medicine

## 2012-02-23 ENCOUNTER — Ambulatory Visit (INDEPENDENT_AMBULATORY_CARE_PROVIDER_SITE_OTHER): Payer: Medicare PPO | Admitting: Family Medicine

## 2012-02-23 ENCOUNTER — Other Ambulatory Visit (INDEPENDENT_AMBULATORY_CARE_PROVIDER_SITE_OTHER): Payer: Medicare PPO

## 2012-02-23 VITALS — BP 150/86 | HR 50 | Ht 64.0 in | Wt 178.0 lb

## 2012-02-23 DIAGNOSIS — L723 Sebaceous cyst: Secondary | ICD-10-CM

## 2012-02-23 DIAGNOSIS — Z452 Encounter for adjustment and management of vascular access device: Secondary | ICD-10-CM

## 2012-02-23 DIAGNOSIS — I4891 Unspecified atrial fibrillation: Secondary | ICD-10-CM

## 2012-02-23 DIAGNOSIS — L720 Epidermal cyst: Secondary | ICD-10-CM | POA: Insufficient documentation

## 2012-02-23 DIAGNOSIS — E876 Hypokalemia: Secondary | ICD-10-CM

## 2012-02-23 DIAGNOSIS — Z7902 Long term (current) use of antithrombotics/antiplatelets: Secondary | ICD-10-CM

## 2012-02-23 NOTE — Patient Instructions (Signed)
2.5 daily., 5 mg Tue/Thur check in 2 weeks

## 2012-02-23 NOTE — Assessment & Plan Note (Signed)
Not a Bartholin's cyst--just an inclusion cyst of labia majora, not currently inflamed or infected.  No treatment is necessary, however, she will try warm compresses to see if it will drain.  Discussed that it might return.  If desires treatment, would need cyst removal (in office) but would stop coumadin for several days.

## 2012-02-23 NOTE — Patient Instructions (Addendum)
It was great meeting you today.  It looks like you have a cyst of the hair follicle.  Try some warm compresses to see if it will drain.  If not, we may need office surgical removal.  You would need to stop your coumadin for a few days.  It is not presently inflamed, or infected and needs no treatment right now.  If you want it removed, call us back to schedule the appointment.  Abscess An abscess is an infected area that contains a collection of pus and debris.It can occur in almost any part of the body. An abscess is also known as a furuncle or boil. CAUSES  An abscess occurs when tissue gets infected. This can occur from blockage of oil or sweat glands, infection of hair follicles, or a minor injury to the skin. As the body tries to fight the infection, pus collects in the area and creates pressure under the skin. This pressure causes pain. People with weakened immune systems have difficulty fighting infections and get certain abscesses more often.  SYMPTOMS Usually an abscess develops on the skin and becomes a painful mass that is red, warm, and tender. If the abscess forms under the skin, you may feel a moveable soft area under the skin. Some abscesses break open (rupture) on their own, but most will continue to get worse without care. The infection can spread deeper into the body and eventually into the bloodstream, causing you to feel ill.  DIAGNOSIS  Your caregiver will take your medical history and perform a physical exam. A sample of fluid may also be taken from the abscess to determine what is causing your infection. TREATMENT  Your caregiver may prescribe antibiotic medicines to fight the infection. However, taking antibiotics alone usually does not cure an abscess. Your caregiver may need to make a small cut (incision) in the abscess to drain the pus. In some cases, gauze is packed into the abscess to reduce pain and to continue draining the area. HOME CARE INSTRUCTIONS   Only take  over-the-counter or prescription medicines for pain, discomfort, or fever as directed by your caregiver.  If you were prescribed antibiotics, take them as directed. Finish them even if you start to feel better.  If gauze is used, follow your caregiver's directions for changing the gauze.  To avoid spreading the infection:  Keep your draining abscess covered with a bandage.  Wash your hands well.  Do not share personal care items, towels, or whirlpools with others.  Avoid skin contact with others.  Keep your skin and clothes clean around the abscess.  Keep all follow-up appointments as directed by your caregiver. SEEK MEDICAL CARE IF:   You have increased pain, swelling, redness, fluid drainage, or bleeding.  You have muscle aches, chills, or a general ill feeling.  You have a fever. MAKE SURE YOU:   Understand these instructions.  Will watch your condition.  Will get help right away if you are not doing well or get worse. Document Released: 01/21/2005 Document Revised: 10/13/2011 Document Reviewed: 06/26/2011 [facility] Patient Information 2013 [place], [place]. Epidermal Cyst An epidermal cyst is sometimes called a sebaceous cyst, epidermal inclusion cyst, or infundibular cyst. These cysts usually contain a substance that looks "pasty" or "cheesy" and may have a bad smell. This substance is a protein called keratin. Epidermal cysts are usually found on the face, neck, or trunk. They may also occur in the vaginal area or other parts of the genitalia of both men and women.  Epidermal cysts are usually small, painless, slow-growing bumps or lumps that move freely under the skin. It is important not to try to pop them. This may cause an infection and lead to tenderness and swelling. CAUSES  Epidermal cysts may be caused by a deep penetrating injury to the skin or a plugged hair follicle, often associated with acne. SYMPTOMS  Epidermal cysts can become inflamed and  cause:  Redness.  Tenderness.  Increased temperature of the skin over the bumps or lumps.  Grayish-white, bad smelling material that drains from the bump or lump. DIAGNOSIS  Epidermal cysts are easily diagnosed by your caregiver during an exam. Rarely, a tissue sample (biopsy) may be taken to rule out other conditions that may resemble epidermal cysts. TREATMENT   Epidermal cysts often get better and disappear on their own. They are rarely ever cancerous.  If a cyst becomes infected, it may become inflamed and tender. This may require opening and draining the cyst. Treatment with antibiotics may be necessary. When the infection is gone, the cyst may be removed with minor surgery.  Small, inflamed cysts can often be treated with antibiotics or by injecting steroid medicines.  Sometimes, epidermal cysts become large and bothersome. If this happens, surgical removal in your caregiver's office may be necessary. HOME CARE INSTRUCTIONS  Only take over-the-counter or prescription medicines as directed by your caregiver.  Take your antibiotics as directed. Finish them even if you start to feel better. SEEK MEDICAL CARE IF:   Your cyst becomes tender, red, or swollen.  Your condition is not improving or is getting worse.  You have any other questions or concerns. MAKE SURE YOU:  Understand these instructions.  Will watch your condition.  Will get help right away if you are not doing well or get worse. Document Released: 03/14/2004 Document Revised: 07/06/2011 Document Reviewed: 10/20/2010 [facility] Patient Information 2013 [place], [place].

## 2012-02-23 NOTE — Progress Notes (Signed)
Subjective:    Patient ID: Angela Ali, female    DOB: December 26, 1928, 78 y.o.   MRN: [id number]  HPI  New pt. Today.  Referred for evaluation of ? Bartholin's gland cyst.  She was recently treated for abscess of the right thigh by Dr. Lindie Spruce.  Saw Dr. Ermalene Searing (PCP) who then referred her here.  She reports swelling in area for several weeks.  Notes no drainage or pain in the area.  Has a h/o cysts in the past.  Has diagnosis of DM.  Recent A1C was 6.3.   Past Medical History  Diagnosis Date  . Overweight   . Secondary cardiomyopathy, unspecified   . Atrial flutter   . Congestive heart failure, unspecified   . Edema   . Other and unspecified hyperlipidemia   . Unspecified essential hypertension   . Allergic rhinitis, cause unspecified   . Colonic polyp   . Diverticulosis of colon (without mention of hemorrhage)   . Other malaise and fatigue   . Stricture and stenosis of esophagus   . Bacterial pneumonia, unspecified   . Other abnormal glucose   . Esophageal reflux   . Osteoarthrosis, unspecified whether generalized or localized, unspecified site   . Displacement of lumbar intervertebral disc without myelopathy   . Diaphragmatic hernia without mention of obstruction or gangrene   . Long term (current) use of anticoagulants   . Atrial fibrillation   . Prediabetes   . Splenic mass 08/21/2011  . Thigh abscess    Past Surgical History  Procedure Date  . Esophageal dilation 01/2007  . Abdominal hysterectomy 1971    partial  . Ankle surgery 2001  . Cholecystectomy 2004   Family History  Problem Relation Age of Onset  . Emphysema Father 52  . Coronary artery disease Mother 25  . Hypertension Mother 37  . Hyperlipidemia Sister     # 1  . Hyperlipidemia Sister     # 2  . Diabetes Daughter     # 1  . Hypertension Daughter   . Diabetes Daughter     # 2   . Hypertension Daughter   . Ovarian cancer Daughter   . Cancer Son     bone marrow ca   History   Social History  .  Marital Status: Widowed    Spouse Name: N/A    Number of Children: 5  . Years of Education: N/A   Occupational History  . Retired Runner, broadcasting/film/video     retired Runner, broadcasting/film/video.  Used to live in [place], [place], now in [place] with her family   Social History Main Topics  . Smoking status: Never Smoker   . Smokeless tobacco: Never Used  . Alcohol Use: No  . Drug Use: No  . Sexually Active: No   Other Topics Concern  . Not on file   Social History Narrative   Lost spouse to cancer in 4/20085 childrenDoes not get regular exercise   Allergies  Allergen Reactions  . Morphine And Related   . Percocet (Oxycodone-Acetaminophen) Nausea Only       . Tramadol Nausea Only      Current Outpatient Prescriptions on File Prior to Visit  Medication Sig Dispense Refill  . Ascorbic Acid (VITAMIN C PO) Take 1 tablet by mouth daily.      Marland Kitchen atorvastatin (LIPITOR) 80 MG tablet Take 80 mg by mouth daily.      . Cholecalciferol (VITAMIN D-3 PO) Take 1 tablet by mouth daily.      Marland Kitchen  cloNIDine (CATAPRES) 0.1 MG tablet Take 1 tablet (0.1 mg total) by mouth 2 (two) times daily.  60 tablet  2  . digoxin (LANOXIN) 0.125 MG tablet Take 0.125 mg by mouth daily.      Marland Kitchen diltiazem (CARDIZEM) 30 MG tablet Take 30 mg by mouth 4 (four) times daily as needed. For fast heart beat      . furosemide (LASIX) 80 MG tablet Take 80 mg by mouth daily as needed. For excessive fluid      . lisinopril (PRINIVIL,ZESTRIL) 40 MG tablet Take 1 tablet (40 mg total) by mouth daily.  30 tablet  0  . metoprolol (TOPROL-XL) 200 MG 24 hr tablet Take 200 mg by mouth daily.      Marland Kitchen omeprazole (PRILOSEC) 20 MG capsule Take 20 mg by mouth daily.      . potassium chloride SA (K-DUR,KLOR-CON) 20 MEQ tablet Take 1 tablet (20 mEq total) by mouth daily.  30 tablet  3  . warfarin (COUMADIN) 2.5 MG tablet Take 2.5 mg by mouth See admin instructions. Take 2.5 mg on Tuesday, Thursday, and Saturdays (alternating with 5mg  on other days)      . warfarin  (COUMADIN) 5 MG tablet Take 5 mg by mouth See admin instructions. Take 5 mg on Mon, Wed, Fri, Sunday (2.5 mg on other days)      . DISCONTD: esomeprazole (NEXIUM) 40 MG capsule Take 40 mg by mouth daily.           Review of Systems  Constitutional: Negative for fever and appetite change.  Gastrointestinal: Negative for abdominal pain.  Genitourinary: Negative for dysuria, urgency, vaginal bleeding, difficulty urinating, genital sores, vaginal pain, menstrual problem and pelvic pain.       Objective:   Physical Exam  Vitals reviewed. Constitutional: She appears well-developed and well-nourished.  HENT:  Head: Normocephalic and atraumatic.  Neck: Neck supple.  Cardiovascular: Normal rate.   Pulmonary/Chest: Effort normal.  Abdominal: Soft. There is no tenderness.  Genitourinary:       Skin tag noted.  Small 0.6 x 0.4 cm inclusion cyst noted on left labia majora.  No evidence of Bartholin's gland cyst noted.  This area is non-tender.  There is no fluctuance.          Assessment & Plan:  No need for further pap smears.

## 2012-02-24 ENCOUNTER — Other Ambulatory Visit: Payer: Medicare PPO

## 2012-02-24 ENCOUNTER — Ambulatory Visit: Payer: Medicare PPO

## 2012-02-25 ENCOUNTER — Encounter: Payer: Self-pay | Admitting: *Deleted

## 2012-02-26 ENCOUNTER — Telehealth: Payer: Self-pay

## 2012-02-26 NOTE — Telephone Encounter (Signed)
Pt had diarrhea x 3 today, has diarrhea on and off for 1 week; pt wants to know if can take Immodium AD OTC since on blood thinner.Please advise.

## 2012-02-26 NOTE — Telephone Encounter (Addendum)
I would not have her take immodium, can acutally cause more problems than good. Push fluids and increase fiber in diet. If diarrhea not imprpving have her make appt next week. We will likely need to check Cdif.

## 2012-02-26 NOTE — Telephone Encounter (Signed)
Pt notified of providers orders, stated an understanding.

## 2012-03-07 ENCOUNTER — Ambulatory Visit (INDEPENDENT_AMBULATORY_CARE_PROVIDER_SITE_OTHER): Payer: Medicare PPO | Admitting: Family Medicine

## 2012-03-07 DIAGNOSIS — Z7902 Long term (current) use of antithrombotics/antiplatelets: Secondary | ICD-10-CM

## 2012-03-07 DIAGNOSIS — I4891 Unspecified atrial fibrillation: Secondary | ICD-10-CM

## 2012-03-07 DIAGNOSIS — Z452 Encounter for adjustment and management of vascular access device: Secondary | ICD-10-CM

## 2012-03-07 NOTE — Patient Instructions (Signed)
Increase to 2.5 daily, 5 mg Tue/Thur/ Sat check in 2 weeks

## 2012-03-08 ENCOUNTER — Ambulatory Visit: Payer: Medicare PPO

## 2012-03-17 ENCOUNTER — Ambulatory Visit: Payer: Medicare PPO | Admitting: Family Medicine

## 2012-03-21 ENCOUNTER — Ambulatory Visit (INDEPENDENT_AMBULATORY_CARE_PROVIDER_SITE_OTHER): Payer: Medicare PPO | Admitting: Family Medicine

## 2012-03-21 DIAGNOSIS — I4891 Unspecified atrial fibrillation: Secondary | ICD-10-CM

## 2012-03-21 DIAGNOSIS — Z7902 Long term (current) use of antithrombotics/antiplatelets: Secondary | ICD-10-CM

## 2012-03-21 DIAGNOSIS — Z452 Encounter for adjustment and management of vascular access device: Secondary | ICD-10-CM

## 2012-03-21 NOTE — Patient Instructions (Signed)
5 mg daily, except 2.5 mg Wed, Fri recheck in 2 weeks

## 2012-03-23 IMAGING — CR DG TIBIA/FIBULA 2V*R*
4 series · 4 of 4 positions shown · non-contrast
Comparison: Radiographs dated 07/27/2010

CLINICAL DATA: Right lower leg bruising and swelling.

RIGHT TIBIA AND FIBULA - 2 VIEW

[view not recorded (1 of 4)]
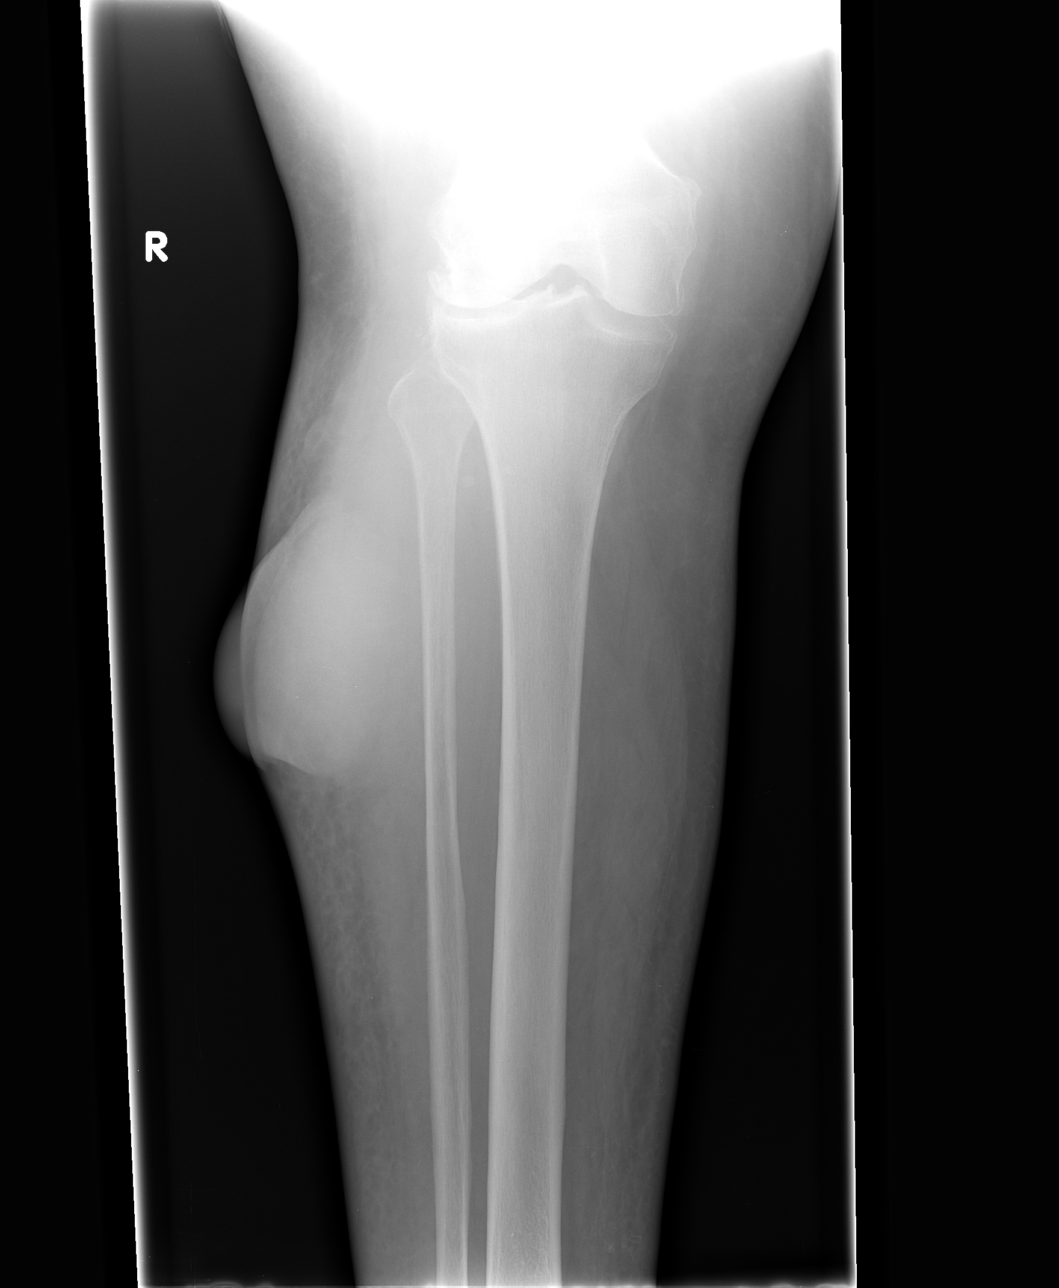

[view not recorded (2 of 4)]
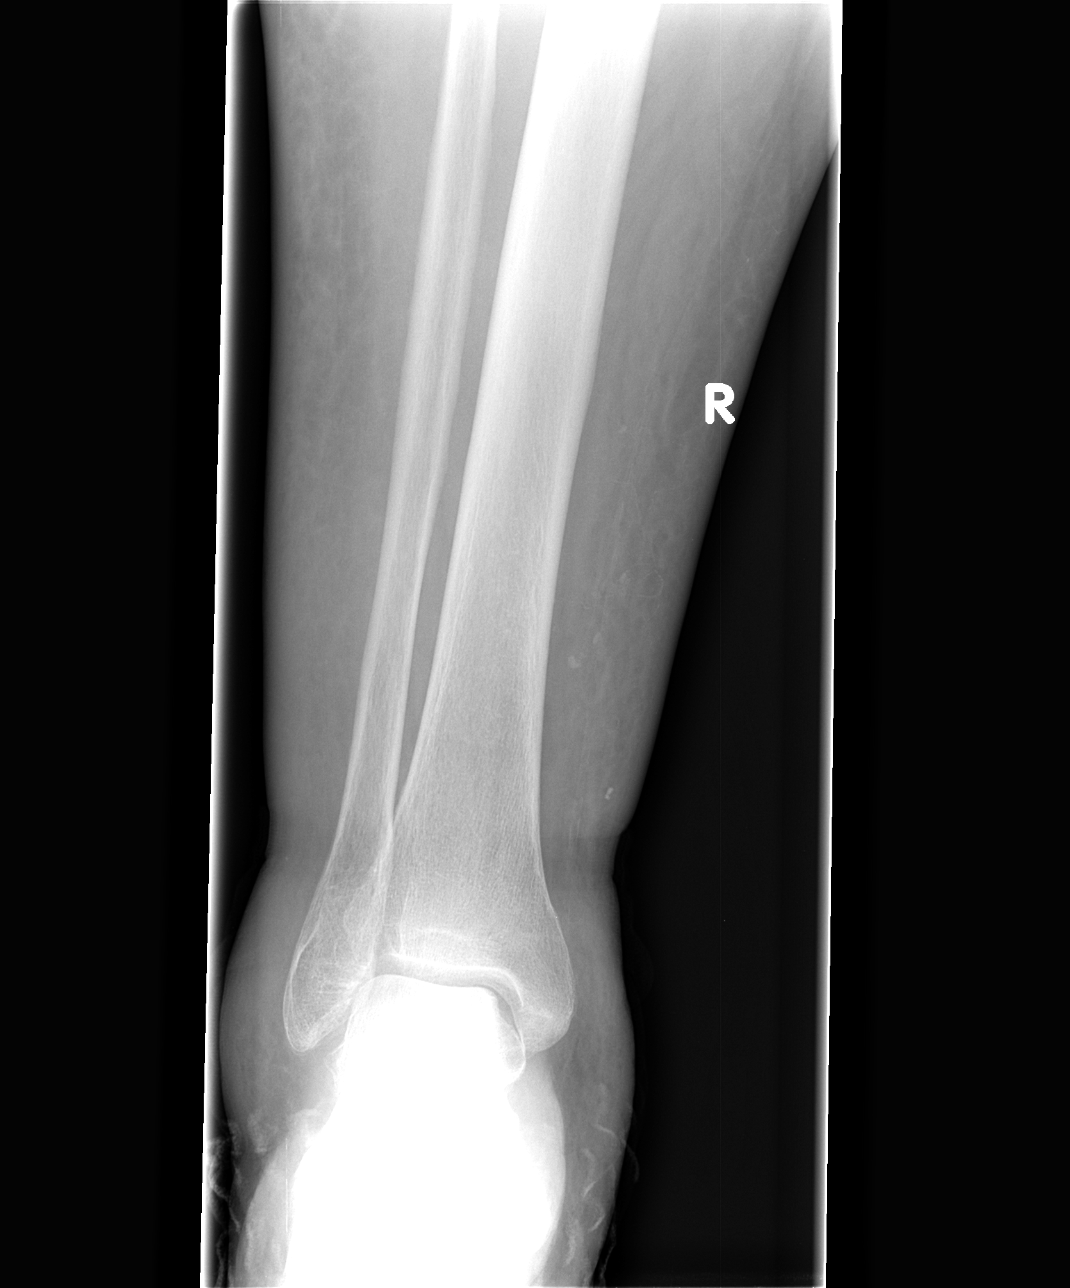

[view not recorded (3 of 4)]
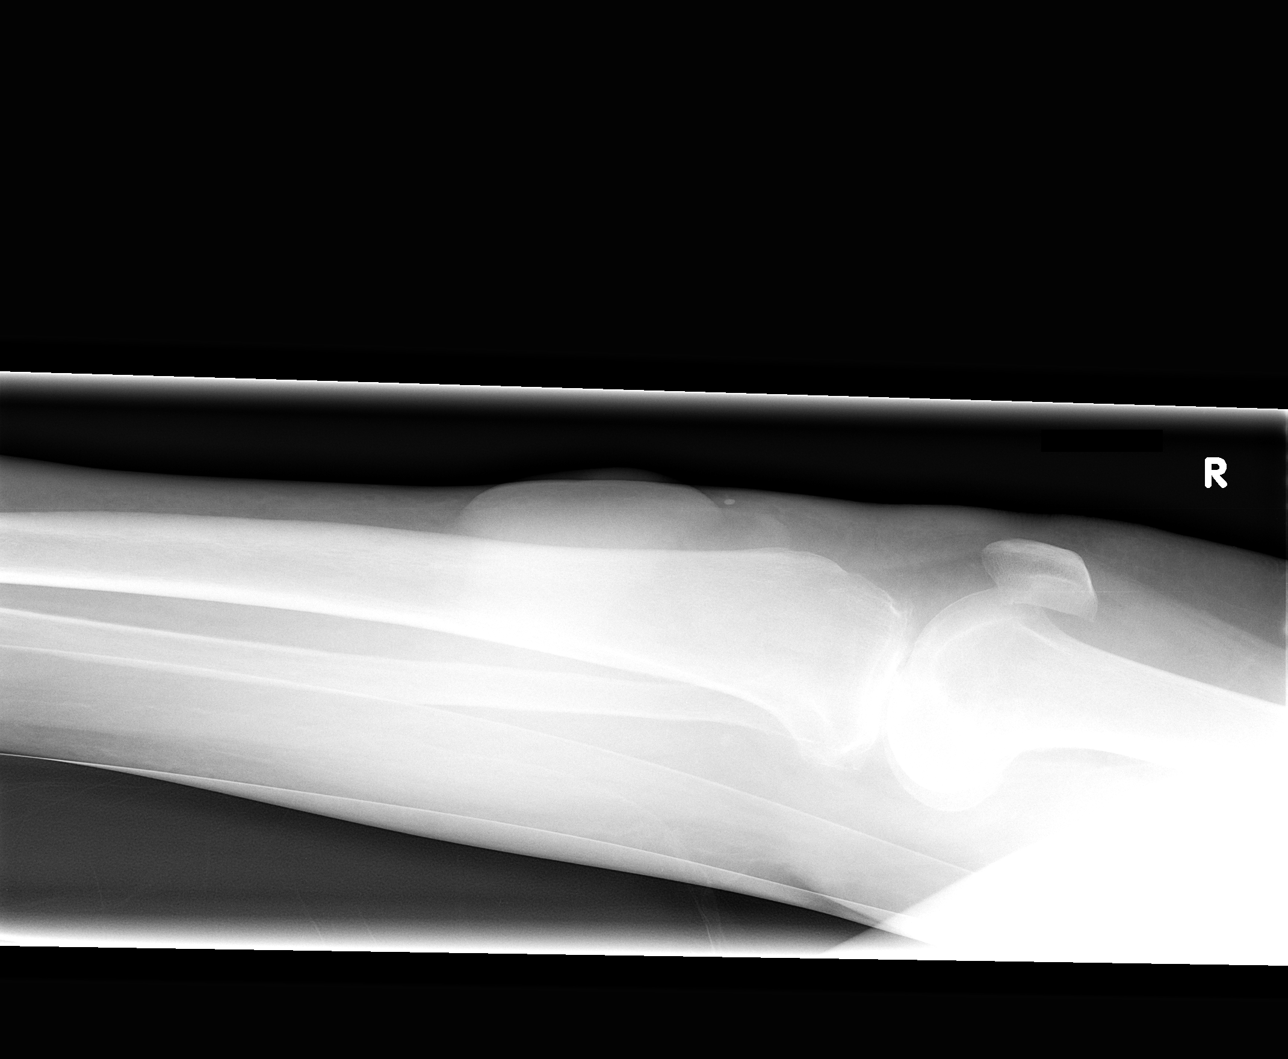

[view not recorded (4 of 4)]
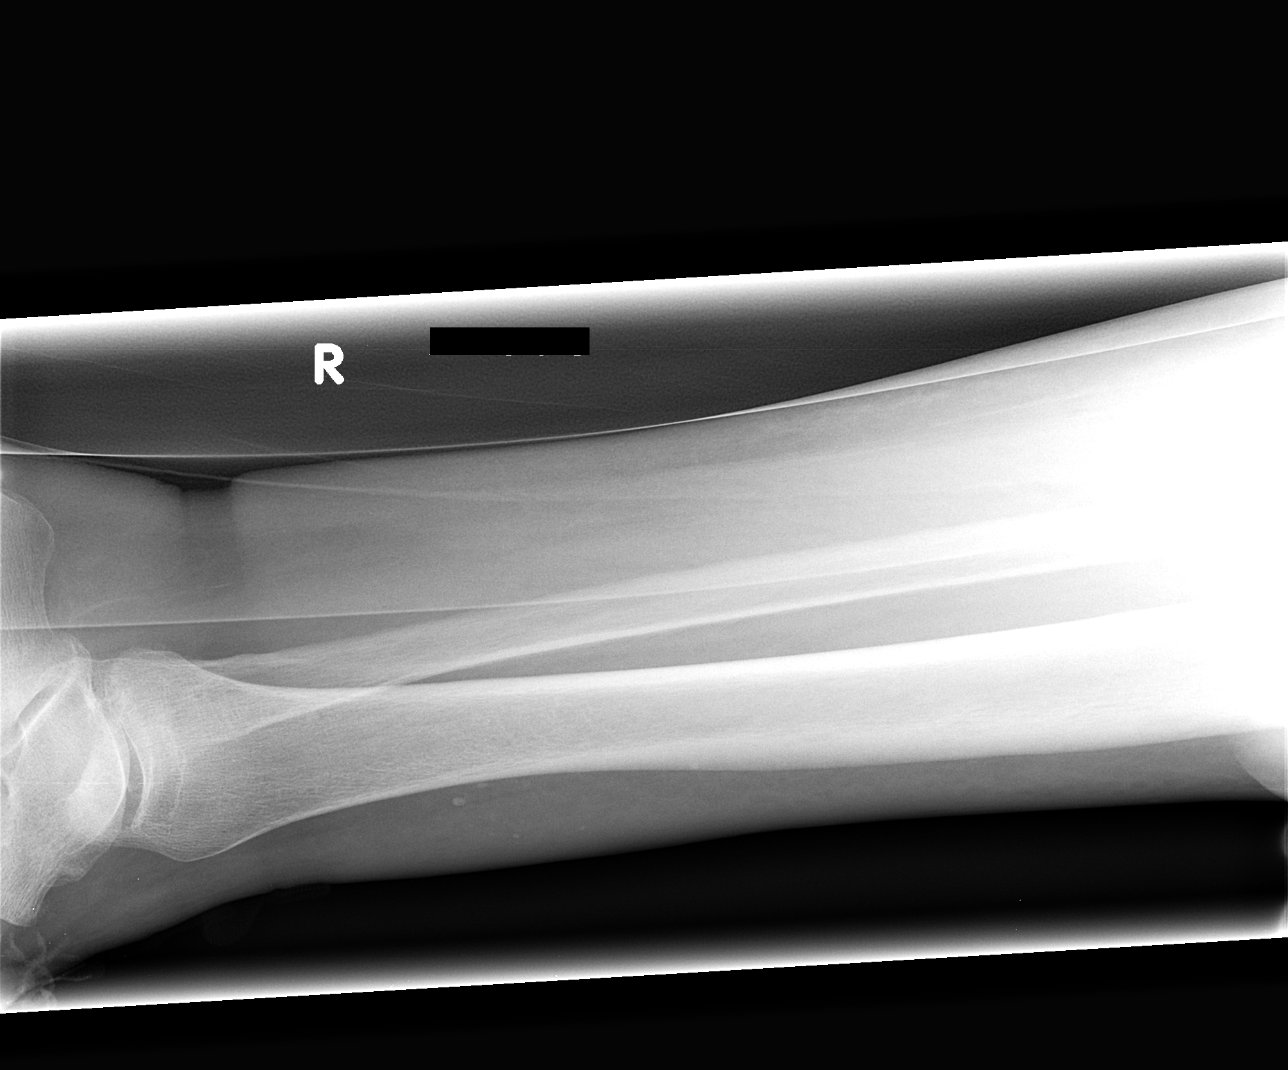

[4 of 4 positions shown; findings below may reference images not displayed]

FINDINGS: There are no acute abnormalities of the tibia or fibula.
Arthritic changes present at the right knee.

There is a 9.3 cm " mass" in the soft tissues of the anterior
lateral aspect of the proximal right lower leg consistent with a
hematoma from the prior fall.  Overall size of this density has not
changed significantly since the prior exam.
IMPRESSION: Persistent soft tissue "mass "in the soft tissues of the right
lower leg, unchanged.  This most likely represents a hematoma,
given the patient's history.

## 2012-03-28 ENCOUNTER — Other Ambulatory Visit: Payer: Self-pay | Admitting: Family Medicine

## 2012-04-07 ENCOUNTER — Ambulatory Visit (INDEPENDENT_AMBULATORY_CARE_PROVIDER_SITE_OTHER): Payer: Medicare PPO | Admitting: General Practice

## 2012-04-07 DIAGNOSIS — I4891 Unspecified atrial fibrillation: Secondary | ICD-10-CM

## 2012-04-07 DIAGNOSIS — Z7902 Long term (current) use of antithrombotics/antiplatelets: Secondary | ICD-10-CM

## 2012-04-07 DIAGNOSIS — Z5181 Encounter for therapeutic drug level monitoring: Secondary | ICD-10-CM

## 2012-04-07 LAB — POCT INR: INR: 2.3

## 2012-04-21 ENCOUNTER — Other Ambulatory Visit: Payer: Self-pay | Admitting: *Deleted

## 2012-04-21 MED ORDER — FUROSEMIDE 80 MG PO TABS: 80.0000 mg | ORAL_TABLET | Freq: Every day | ORAL | Status: DC | PRN

## 2012-04-21 MED ORDER — DILTIAZEM HCL 30 MG PO TABS: 30.0000 mg | ORAL_TABLET | Freq: Four times a day (QID) | ORAL | Status: DC | PRN

## 2012-04-21 MED ORDER — WARFARIN SODIUM 5 MG PO TABS: 5.0000 mg | ORAL_TABLET | ORAL | Status: DC

## 2012-04-21 MED ORDER — DIGOXIN 125 MCG PO TABS: 0.1250 mg | ORAL_TABLET | Freq: Every day | ORAL | Status: DC

## 2012-04-21 MED ORDER — CLONIDINE HCL 0.1 MG PO TABS: 0.1000 mg | ORAL_TABLET | Freq: Two times a day (BID) | ORAL | Status: DC

## 2012-04-21 MED ORDER — METOPROLOL SUCCINATE ER 200 MG PO TB24: 200.0000 mg | ORAL_TABLET | Freq: Every day | ORAL | Status: DC

## 2012-04-28 ENCOUNTER — Ambulatory Visit (INDEPENDENT_AMBULATORY_CARE_PROVIDER_SITE_OTHER): Payer: Medicare PPO | Admitting: General Practice

## 2012-04-28 ENCOUNTER — Ambulatory Visit: Payer: Medicare PPO

## 2012-05-09 ENCOUNTER — Ambulatory Visit (INDEPENDENT_AMBULATORY_CARE_PROVIDER_SITE_OTHER): Payer: Medicare PPO | Admitting: General Practice

## 2012-05-09 DIAGNOSIS — Z5181 Encounter for therapeutic drug level monitoring: Secondary | ICD-10-CM

## 2012-05-09 DIAGNOSIS — I4891 Unspecified atrial fibrillation: Secondary | ICD-10-CM

## 2012-05-09 DIAGNOSIS — Z7902 Long term (current) use of antithrombotics/antiplatelets: Secondary | ICD-10-CM

## 2012-05-09 LAB — POCT INR: INR: 2.8

## 2012-05-25 ENCOUNTER — Ambulatory Visit: Payer: Medicare PPO | Admitting: Cardiovascular Disease

## 2012-05-30 ENCOUNTER — Ambulatory Visit (INDEPENDENT_AMBULATORY_CARE_PROVIDER_SITE_OTHER): Payer: Medicare PPO | Admitting: General Practice

## 2012-05-30 DIAGNOSIS — Z7902 Long term (current) use of antithrombotics/antiplatelets: Secondary | ICD-10-CM

## 2012-05-30 DIAGNOSIS — I4891 Unspecified atrial fibrillation: Secondary | ICD-10-CM

## 2012-05-30 DIAGNOSIS — Z5181 Encounter for therapeutic drug level monitoring: Secondary | ICD-10-CM

## 2012-05-30 LAB — POCT INR: INR: 2

## 2012-05-31 ENCOUNTER — Encounter: Payer: Self-pay | Admitting: Cardiovascular Disease

## 2012-05-31 ENCOUNTER — Ambulatory Visit (INDEPENDENT_AMBULATORY_CARE_PROVIDER_SITE_OTHER): Payer: Medicare PPO | Admitting: Cardiovascular Disease

## 2012-05-31 VITALS — BP 200/100 | HR 52 | Ht 64.0 in | Wt 179.0 lb

## 2012-05-31 DIAGNOSIS — E785 Hyperlipidemia, unspecified: Secondary | ICD-10-CM

## 2012-05-31 DIAGNOSIS — E118 Type 2 diabetes mellitus with unspecified complications: Secondary | ICD-10-CM

## 2012-05-31 DIAGNOSIS — I10 Essential (primary) hypertension: Secondary | ICD-10-CM

## 2012-05-31 DIAGNOSIS — I4891 Unspecified atrial fibrillation: Secondary | ICD-10-CM

## 2012-05-31 DIAGNOSIS — I509 Heart failure, unspecified: Secondary | ICD-10-CM

## 2012-05-31 MED ORDER — HYDROCHLOROTHIAZIDE 25 MG PO TABS: 25.0000 mg | ORAL_TABLET | Freq: Every day | ORAL | Status: DC

## 2012-05-31 NOTE — Progress Notes (Signed)
Patient ID: Angela Ali, female    DOB: 29-Mar-1929, 79 y.o.   MRN: [id number]  HPI Comments: Angela Ali is a very pleasant 79 year old woman with a history of chronic atrial fibrillation on warfarin, hypertension, chronic lower extremity edema, shortness of breath, who presents for routine followup. She had a fall with hematoma  In the past,  required care from the wound clinic for a large ulcerative wound on the right lateral aspect of her leg. This has healed well.  Blood pressure has been a significant issue in balancing her medications with edema. Calcium channel blocker/diltiazem was held in the past secondary to edema.  She takes her Lasix periodically. She does not check her blood pressure at home on her regular basis .  She denies any significant palpitations. Otherwise she feels well. No episodes of chest pain, lightheadedness or dizziness. She is walking with a walker. She is only taking clonidine 0.1 mg once a day. Blood pressure is elevated today. She does have significant fatigue and is concerned about the metoprolol  She was unable to tolerate hydralazine because of headache. She also reports high dose clonidine causes headache  EKG shows atrial fibrillation with ventricular rate of 52 beats per minute, no significant ST or T wave changes, poor R-wave progression to the anterior precordial leads    Outpatient Encounter Prescriptions as of 05/31/2012  Medication Sig Dispense Refill  . Ascorbic Acid (VITAMIN C PO) Take 1 tablet by mouth daily.      Marland Kitchen atorvastatin (LIPITOR) 80 MG tablet Take 80 mg by mouth daily.      . benzonatate (TESSALON) 100 MG capsule TAKE 2 CAPSULES (200 MG TOTAL) BY MOUTH 3 (THREE) TIMES DAILY AS NEEDED FOR COUGH.  90 capsule  0  . Cholecalciferol (VITAMIN D-3 PO) Take 1 tablet by mouth daily.      . cloNIDine (CATAPRES) 0.1 MG tablet Take 0.1 mg by mouth daily.       . digoxin (LANOXIN) 0.125 MG tablet Take 1 tablet (0.125 mg total) by mouth daily.  3 tablet   0  . diltiazem (CARDIZEM) 30 MG tablet Take 1 tablet (30 mg total) by mouth 4 (four) times daily as needed. For fast heart beat  12 tablet  0  . furosemide (LASIX) 80 MG tablet Take 1 tablet (80 mg total) by mouth daily as needed. For excessive fluid  3 tablet  0  . lisinopril (PRINIVIL,ZESTRIL) 40 MG tablet Take 1 tablet (40 mg total) by mouth daily.  30 tablet  0  . metoprolol (TOPROL-XL) 200 MG 24 hr tablet Take 1 tablet (200 mg total) by mouth daily.  3 tablet  0  . omeprazole (PRILOSEC) 20 MG capsule Take 20 mg by mouth daily.      . potassium chloride SA (K-DUR,KLOR-CON) 20 MEQ tablet Take 20 mEq by mouth daily as needed.      . warfarin (COUMADIN) 2.5 MG tablet Take 2.5 mg by mouth See admin instructions. Take 2.5 mg on Tuesday, Thursday, and Saturdays (alternating with 5mg  on other days)      . warfarin (COUMADIN) 5 MG tablet Take 1 tablet (5 mg total) by mouth See admin instructions. Take 5 mg on Mon, Wed, Fri, Sunday (2.5 mg on other days)  3 tablet  0     Review of Systems  Constitutional: Negative.   HENT: Negative.   Eyes: Negative.   Respiratory: Negative.   Cardiovascular: Positive for leg swelling.  Gastrointestinal: Negative.  Musculoskeletal: Positive for gait problem.  Skin: Negative.   Neurological: Negative.   Hematological: Negative.   Psychiatric/Behavioral: Negative.   All other systems reviewed and are negative.   BP 200/100  Pulse 52  Ht 5\' 4"  (1.626 m)  Wt 179 lb (81.194 kg)  BMI 30.73 kg/m2 Repeat blood pressure was 188/110  Physical Exam  Nursing note and vitals reviewed. Constitutional: She is oriented to person, place, and time. She appears well-developed and well-nourished.       Obese   HENT:  Head: Normocephalic.  Nose: Nose normal.  Mouth/Throat: Oropharynx is clear and moist.  Eyes: Conjunctivae normal are normal. Pupils are equal, round, and reactive to light.  Neck: Normal range of motion. Neck supple. No JVD present.   Cardiovascular: Normal rate, regular rhythm, S1 normal, S2 normal, normal heart sounds and intact distal pulses.  Exam reveals no gallop and no friction rub.   No murmur heard. Pulmonary/Chest: Effort normal and breath sounds normal. No respiratory distress. She has no wheezes. She has no rales. She exhibits no tenderness.  Abdominal: Soft. Bowel sounds are normal. She exhibits no distension. There is no tenderness.  Musculoskeletal: Normal range of motion. She exhibits edema. She exhibits no tenderness.  Lymphadenopathy:    She has no cervical adenopathy.  Neurological: She is alert and oriented to person, place, and time. Coordination normal.  Skin: Skin is warm and dry. No rash noted. No erythema.  Psychiatric: She has a normal mood and affect. Her behavior is normal. Judgment and thought content normal.         Assessment and Plan

## 2012-05-31 NOTE — Assessment & Plan Note (Signed)
Blood pressure is concerning. We have suggested she take clonidine 0.1 mg twice a day, even 3 times a day if tolerated. Problems in the past with headache. We will start HCTZ 25 mg daily with a BMP in 2 weeks on return visit. Continue other medications

## 2012-05-31 NOTE — Assessment & Plan Note (Signed)
She's taking Lasix periodically. No signs of decompensated heart failure. Trace edema.

## 2012-05-31 NOTE — Patient Instructions (Addendum)
Take metoprolol 1/2 pill in the Am Increase the clonidine to 1/2 pill in the Am and PM  Please take HCTZ one a day for blood pressure (weak diuretic)  Please call us if you have new issues that need to be addressed before your next appt.  Your physician wants you to follow-up in: 2 weeks    Atrial Fibrillation Your caregiver has diagnosed you with atrial fibrillation (AFib). The heart normally beats very regularly; AFib is a type of irregular heartbeat. The heart rate may be faster or slower than normal. This can prevent your heart from pumping as well as it should. AFib can be constant (chronic) or intermittent (paroxysmal). CAUSES   Atrial fibrillation may be caused by:  Heart disease, including heart attack, coronary artery disease, heart failure, diseases of the heart valves, and others.   Blood clot in the lungs (pulmonary embolism).   Pneumonia or other infections.   Chronic lung disease.   Thyroid disease.   Toxins. These include alcohol, some medications (such as decongestant medications or diet pills), and caffeine.  In some people, no cause for AFib can be found. This is referred to as Lone Atrial Fibrillation. SYMPTOMS    Palpitations or a fluttering in your chest.   A vague sense of chest discomfort.   Shortness of breath.   Sudden onset of lightheadedness or weakness.  Sometimes, the first sign of AFib can be a complication of the condition. This could be a stroke or heart failure. DIAGNOSIS   Your description of your condition may make your caregiver suspicious of atrial fibrillation. Your caregiver will examine your pulse to determine if fibrillation is present. An EKG (electrocardiogram) will confirm the diagnosis. Further testing may help determine what caused you to have atrial fibrillation. This may include chest x-ray, echocardiogram, blood tests, or CT scans. PREVENTION   If you have previously had atrial fibrillation, your caregiver may advise you to  avoid substances known to cause the condition (such as stimulant medications, and possibly caffeine or alcohol). You may be advised to use medications to prevent recurrence. Proper treatment of any underlying condition is important to help prevent recurrence. PROGNOSIS   Atrial fibrillation does tend to become a chronic condition over time. It can cause significant complications (see below). Atrial fibrillation is not usually immediately life-threatening, but it can shorten your life expectancy. This seems to be worse in women. If you have lone atrial fibrillation and are under 70 years old, the risk of complications is very low, and life expectancy is not shortened. RISKS AND COMPLICATIONS   Complications of atrial fibrillation can include stroke, chest pain, and heart failure. Your caregiver will recommend treatments for the atrial fibrillation, as well as for any underlying conditions, to help minimize risk of complications. TREATMENT   Treatment for AFib is divided into several categories:  Treatment of any underlying condition.   Converting you out of AFib into a regular (sinus) rhythm.   Controlling rapid heart rate.   Prevention of blood clots and stroke.  Medications and procedures are available to convert your atrial fibrillation to sinus rhythm. However, recent studies have shown that this may not offer you any advantage, and cardiac experts are continuing research and debate on this topic. More important is controlling your rapid heartbeat. The rapid heartbeat causes more symptoms, and places strain on your heart. Your caregiver will advise you on the use of medications that can control your heart rate. Atrial fibrillation is a strong stroke risk. You can  lessen this risk by taking blood thinning medications such as Coumadin (warfarin), or sometimes aspirin. These medications need close monitoring by your caregiver. Over-medication can cause bleeding. Too little medication may not protect  against stroke. HOME CARE INSTRUCTIONS    If your caregiver prescribed medicine to make your heartbeat more normally, take as directed.   If blood thinners were prescribed by your caregiver, take EXACTLY as directed.   Perform blood tests EXACTLY as directed.   Quit smoking. Smoking increases your cardiac and lung (pulmonary) risks.   DO NOT drink alcohol.   DO NOT drink caffeinated drinks (e.g. coffee, soda, chocolate, and leaf teas). You may drink decaffeinated coffee, soda or tea.   If you are overweight, you should choose a reduced calorie diet to lose weight. Please see a registered dietitian if you need more information about healthy weight loss. DO NOT USE DIET PILLS as they may aggravate heart problems.   If you have other heart problems that are causing AFib, you may need to eat a low salt, fat, and cholesterol diet. Your caregiver will tell you if this is necessary.   Exercise every day to improve your physical fitness. Stay active unless advised otherwise.   If your caregiver has given you a follow-up appointment, it is very important to keep that appointment. Not keeping the appointment could result in heart failure or stroke. If there is any problem keeping the appointment, you must call back to this facility for assistance.  SEEK MEDICAL CARE IF:  You notice a change in the rate, rhythm or strength of your heartbeat.   You develop an infection or any other change in your overall health status.  SEEK IMMEDIATE MEDICAL CARE IF:    You develop chest pain, abdominal pain, sweating, weakness or feel sick to your stomach (nausea).   You develop shortness of breath.   You develop swollen feet and ankles.   You develop dizziness, numbness, or weakness of your face or limbs, or any change in vision or speech.  MAKE SURE YOU:    Understand these instructions.   Will watch your condition.   Will get help right away if you are not doing well or get worse.  Document  Released: 04/13/2005 Document Revised: 07/06/2011 Document Reviewed: 11/16/2007 [facility] Patient Information 2013 [place], [place].

## 2012-05-31 NOTE — Assessment & Plan Note (Signed)
Heart rate is well controlled on her current medication regimen. In fact it is too slow and we will cut her metoprolol in half to 100 mg daily in the morning. She has significant fatigue

## 2012-05-31 NOTE — Assessment & Plan Note (Signed)
We have encouraged continued exercise, careful diet management in an effort to lose weight. 

## 2012-05-31 NOTE — Assessment & Plan Note (Signed)
Cholesterol is at goal on the current lipid regimen. No changes to the medications were made.  

## 2012-06-04 ENCOUNTER — Ambulatory Visit (INDEPENDENT_AMBULATORY_CARE_PROVIDER_SITE_OTHER): Payer: Medicare PPO | Admitting: Family Medicine

## 2012-06-04 ENCOUNTER — Encounter: Payer: Self-pay | Admitting: Family Medicine

## 2012-06-04 VITALS — BP 134/84 | HR 63 | Temp 97.8°F | Wt 176.8 lb

## 2012-06-04 DIAGNOSIS — N390 Urinary tract infection, site not specified: Secondary | ICD-10-CM | POA: Insufficient documentation

## 2012-06-04 DIAGNOSIS — R300 Dysuria: Secondary | ICD-10-CM

## 2012-06-04 LAB — POCT URINALYSIS DIPSTICK
Ketones, UA: NEGATIVE
pH, UA: 6

## 2012-06-04 MED ORDER — CIPROFLOXACIN HCL 250 MG PO TABS: 250.0000 mg | ORAL_TABLET | Freq: Two times a day (BID) | ORAL | Status: DC

## 2012-06-04 NOTE — Assessment & Plan Note (Signed)
Uncomplicated Will tx with cipro  Needs early INR as this will affect it - pt aware to call office Monday to arrange this  Will drink water  Update if not starting to improve in a week or if worsening

## 2012-06-04 NOTE — Progress Notes (Signed)
Subjective:    Patient ID: Angela Ali, female    DOB: 10-23-1928, 79 y.o.   MRN: [id number]  HPI Here for uti  Has positive ua today   Having some pain in bladder area with pressure since wed  Some burning at the end of urination  No blood in urine  No fever or nausea  She has been holding her urine more than usual  Does have some urge incontinence   Patient Active Problem List  Diagnosis  . COLONIC POLYPS, HYPERPLASTIC  . BENIGN NEOPLASM OTH&UNSPEC SITE DIGESTIVE SYSTEM  . UNSPECIFIED VITAMIN D DEFICIENCY  . HYPERLIPIDEMIA  . OVERWEIGHT/OBESITY  . HYPERTENSION  . CARDIOMYOPATHY, SECONDARY  . Atrial fibrillation  . CONGESTIVE HEART FAILURE  . ALLERGIC RHINITIS  . ASTHMA, PERSISTENT, MODERATE  . STRICTURE, ESOPHAGEAL  . GERD  . HIATAL HERNIA  . DIVERTICULOSIS, COLON  . OSTEOARTHRITIS  . KNEE PAIN, RIGHT, CHRONIC  . HERNIATED LUMBAR DISC  . LOW BACK PAIN, CHRONIC  . OSTEOPOROSIS  . FATIGUE, CHRONIC  . PERIPHERAL EDEMA  . Diabetes mellitus type 2 with complications  . Hx of Hematoma of leg, with chronic leg weakness and numbness  . Splenic mass  . Spleen disorder  . Chronic constipation  . Abscess, right groin  . Open wound of groin without complication  . Inclusion cyst  . UTI (lower urinary tract infection)   Past Medical History  Diagnosis Date  . Overweight   . Secondary cardiomyopathy, unspecified   . Atrial flutter   . Congestive heart failure, unspecified   . Edema   . Other and unspecified hyperlipidemia   . Unspecified essential hypertension   . Allergic rhinitis, cause unspecified   . Colonic polyp   . Diverticulosis of colon (without mention of hemorrhage)   . Other malaise and fatigue   . Stricture and stenosis of esophagus   . Bacterial pneumonia, unspecified   . Other abnormal glucose   . Esophageal reflux   . Osteoarthrosis, unspecified whether generalized or localized, unspecified site   . Displacement of lumbar intervertebral disc  without myelopathy   . Diaphragmatic hernia without mention of obstruction or gangrene   . Long term (current) use of anticoagulants   . Atrial fibrillation   . Prediabetes   . Splenic mass 08/21/2011  . Thigh abscess    Past Surgical History  Procedure Laterality Date  . Esophageal dilation  01/2007  . Abdominal hysterectomy  1971    partial  . Ankle surgery  2001  . Cholecystectomy  2004   History  Substance Use Topics  . Smoking status: Never Smoker   . Smokeless tobacco: Never Used  . Alcohol Use: No   Family History  Problem Relation Age of Onset  . Emphysema Father 57  . Coronary artery disease Mother 35  . Hypertension Mother 49  . Hyperlipidemia Sister     # 1  . Hyperlipidemia Sister     # 2  . Diabetes Daughter     # 1  . Hypertension Daughter   . Diabetes Daughter     # 2   . Hypertension Daughter   . Ovarian cancer Daughter   . Cancer Son     bone marrow ca   Allergies  Allergen Reactions  . Morphine And Related   . Percocet (Oxycodone-Acetaminophen) Nausea Only       . Tramadol Nausea Only   Current Outpatient Prescriptions on File Prior to Visit  Medication Sig Dispense Refill  .  Ascorbic Acid (VITAMIN C PO) Take 1 tablet by mouth daily.      Marland Kitchen atorvastatin (LIPITOR) 80 MG tablet Take 80 mg by mouth daily.      . benzonatate (TESSALON) 100 MG capsule TAKE 2 CAPSULES (200 MG TOTAL) BY MOUTH 3 (THREE) TIMES DAILY AS NEEDED FOR COUGH.  90 capsule  0  . Cholecalciferol (VITAMIN D-3 PO) Take 1 tablet by mouth daily.      . cloNIDine (CATAPRES) 0.1 MG tablet Take 0.1 mg by mouth daily.       . digoxin (LANOXIN) 0.125 MG tablet Take 1 tablet (0.125 mg total) by mouth daily.  3 tablet  0  . diltiazem (CARDIZEM) 30 MG tablet Take 1 tablet (30 mg total) by mouth 4 (four) times daily as needed. For fast heart beat  12 tablet  0  . furosemide (LASIX) 80 MG tablet Take 1 tablet (80 mg total) by mouth daily as needed. For excessive fluid  3 tablet  0  .  hydrochlorothiazide (HYDRODIURIL) 25 MG tablet Take 1 tablet (25 mg total) by mouth daily.  30 tablet  6  . lisinopril (PRINIVIL,ZESTRIL) 40 MG tablet Take 1 tablet (40 mg total) by mouth daily.  30 tablet  0  . metoprolol (TOPROL-XL) 200 MG 24 hr tablet Take 1 tablet (200 mg total) by mouth daily.  3 tablet  0  . omeprazole (PRILOSEC) 20 MG capsule Take 20 mg by mouth daily.      . potassium chloride SA (K-DUR,KLOR-CON) 20 MEQ tablet Take 20 mEq by mouth daily as needed.      . warfarin (COUMADIN) 2.5 MG tablet Take 2.5 mg by mouth See admin instructions. Take 2.5 mg on Tuesday, Thursday, and Saturdays (alternating with 5mg  on other days)      . warfarin (COUMADIN) 5 MG tablet Take 1 tablet (5 mg total) by mouth See admin instructions. Take 5 mg on Mon, Wed, Fri, Sunday (2.5 mg on other days)  3 tablet  0  . [DISCONTINUED] esomeprazole (NEXIUM) 40 MG capsule Take 40 mg by mouth daily.         No current facility-administered medications on file prior to visit.    Review of Systems Review of Systems  Constitutional: Negative for fever, appetite change,  and unexpected weight change.  Eyes: Negative for pain and visual disturbance.  Respiratory: Negative for cough and shortness of breath.   Cardiovascular: Negative for cp or palpitations    Gastrointestinal: Negative for nausea, diarrhea and constipation.  Genitourinary: pos for urgency and frequency. neg for hematuria or flank pain  Skin: Negative for pallor or rash   Neurological: Negative for weakness, light-headedness, numbness and headaches.  Hematological: Negative for adenopathy. Does not bruise/bleed easily.  Psychiatric/Behavioral: Negative for dysphoric mood. The patient is not nervous/anxious.         Objective:   Physical Exam  Constitutional: She appears well-developed and well-nourished. No distress.  HENT:  Head: Normocephalic.  Mouth/Throat: Oropharynx is clear and moist.  Eyes: Conjunctivae and EOM are normal. Pupils  are equal, round, and reactive to light.  Neck: Normal range of motion. Neck supple.  Cardiovascular: Normal rate and regular rhythm.   Pulmonary/Chest: Effort normal and breath sounds normal.  Abdominal: Soft. Bowel sounds are normal. She exhibits no distension and no mass. There is tenderness. There is no rebound and no guarding.  Mild suprapubic tenderness  Musculoskeletal:  No cva tenderness   Neurological: She is alert.  Skin: Skin is warm  and dry. No rash noted.  Psychiatric: She has a normal mood and affect.          Assessment & Plan:

## 2012-06-04 NOTE — Patient Instructions (Addendum)
Take the cipro for urinary tract infection as directed  Make sure to drink water  You will need an early protime appointment next week because the antibiotic may affect your protime  Call Monday to set that up  Update if not starting to improve in a week or if worsening

## 2012-06-07 ENCOUNTER — Telehealth: Payer: Self-pay | Admitting: General Practice

## 2012-06-07 ENCOUNTER — Telehealth: Payer: Self-pay

## 2012-06-07 NOTE — Telephone Encounter (Signed)
Spoke with patient and gave instructions to take 2.5 mg of coumadin every day until re-checked in clinic on Monday 2/17.  Instructed patient to watch for and to report unusual bleeding.

## 2012-06-07 NOTE — Telephone Encounter (Signed)
Pt saw Dr Milinda Antis 06/04/12 and pt is requesting as instructed earlier appt for PT/INR due to pt being on antibiotic and that can affect protime. Please advise. Pt is to call Bailey Mech at [phone] also to schedule appt.

## 2012-06-13 ENCOUNTER — Ambulatory Visit (INDEPENDENT_AMBULATORY_CARE_PROVIDER_SITE_OTHER): Payer: Medicare PPO | Admitting: General Practice

## 2012-06-13 DIAGNOSIS — Z452 Encounter for adjustment and management of vascular access device: Secondary | ICD-10-CM

## 2012-06-13 DIAGNOSIS — I4891 Unspecified atrial fibrillation: Secondary | ICD-10-CM

## 2012-06-13 DIAGNOSIS — Z7902 Long term (current) use of antithrombotics/antiplatelets: Secondary | ICD-10-CM

## 2012-06-15 ENCOUNTER — Other Ambulatory Visit: Payer: Self-pay | Admitting: Family Medicine

## 2012-06-16 ENCOUNTER — Telehealth: Payer: Self-pay | Admitting: *Deleted

## 2012-06-16 NOTE — Telephone Encounter (Signed)
Pt c/o severe headache not able to sleep and fluctuating BP was high this morning. Pt took last BP 177/123 HR: 79 @ 4:37 pm. Pt took her medications 2 hours ago has  No c/o chest pain, shortness of breath or edema.  Pt is wanting to know what she could do until seen tomorrow 06/17/12 @ 10:15.Please advise.

## 2012-06-16 NOTE — Telephone Encounter (Signed)
I advised pt to take an extra 1/2 tablet of lisinopril (20 mg ) tonight to get BP down She c/o h/a from 1/2 clonidine BID I advised her to try tylenol for h/a since pain can make high BP worse She will try this and will keep appt with [place] tomm

## 2012-06-17 ENCOUNTER — Ambulatory Visit (INDEPENDENT_AMBULATORY_CARE_PROVIDER_SITE_OTHER): Payer: Medicare PPO

## 2012-06-17 ENCOUNTER — Ambulatory Visit: Payer: Medicare PPO | Admitting: Cardiovascular Disease

## 2012-06-17 VITALS — BP 156/90 | HR 76 | Ht 66.0 in | Wt 179.5 lb

## 2012-06-17 DIAGNOSIS — I10 Essential (primary) hypertension: Secondary | ICD-10-CM

## 2012-06-17 NOTE — Patient Instructions (Addendum)
May try taking extra 1/2 tablet of lisinopril as needed for high blood pressure until we can see you 2/28 Call us if blood pressure continues to stay elevated despite emdications Have labs drawn at primary care doctor 06/20/12 and fax to [place] at [phone]

## 2012-06-17 NOTE — Progress Notes (Signed)
Pt here for BP check Was scheduled to see Dr. Mariah Milling today but this had to be r/s d/t scheduling conflict Bp today is better than last check in office BP at home: 177/123, 160/107, 162/89, 149/92, 170/119, 167/97 She is taking meds as prescribed Unable to take extra 1/2 tablet clonidine PRN high BP d/t h/a She was r/s to see [place] again 2/28 She sees Dr. Ermalene Searing Monday 2/24 She will have BMP checked that day (per Dr. Windell Hummingbird orders) since starting HCTZ They will fax results to [place] Otherwise, I reassured pt BP much better than before. She should continue meds as prescribed and may try taking an extra 1/2 tablet of lisinopril PRN high BP that doesn't respond to RX meds Otherwise we will see her 2/28 as scheduled Understanding verb

## 2012-06-20 ENCOUNTER — Ambulatory Visit (INDEPENDENT_AMBULATORY_CARE_PROVIDER_SITE_OTHER): Payer: Medicare PPO | Admitting: Family Medicine

## 2012-06-20 ENCOUNTER — Encounter: Payer: Self-pay | Admitting: Family Medicine

## 2012-06-20 ENCOUNTER — Encounter: Payer: Medicare PPO | Admitting: Family Medicine

## 2012-06-20 VITALS — BP 142/88 | HR 77 | Temp 97.5°F | Ht 66.0 in

## 2012-06-20 DIAGNOSIS — E785 Hyperlipidemia, unspecified: Secondary | ICD-10-CM

## 2012-06-20 DIAGNOSIS — L0293 Carbuncle, unspecified: Secondary | ICD-10-CM

## 2012-06-20 DIAGNOSIS — M810 Age-related osteoporosis without current pathological fracture: Secondary | ICD-10-CM

## 2012-06-20 DIAGNOSIS — E118 Type 2 diabetes mellitus with unspecified complications: Secondary | ICD-10-CM

## 2012-06-20 DIAGNOSIS — I10 Essential (primary) hypertension: Secondary | ICD-10-CM

## 2012-06-20 DIAGNOSIS — Z0000 Encounter for general adult medical examination without abnormal findings: Secondary | ICD-10-CM

## 2012-06-20 DIAGNOSIS — L0292 Furuncle, unspecified: Secondary | ICD-10-CM

## 2012-06-20 LAB — COMPREHENSIVE METABOLIC PANEL
AST: 13 U/L (ref 0–37)
BUN: 13 mg/dL (ref 6–23)
CO2: 35 mEq/L — ABNORMAL HIGH (ref 19–32)
Calcium: 10.5 mg/dL (ref 8.4–10.5)
Chloride: 99 mEq/L (ref 96–112)
Creatinine, Ser: 0.9 mg/dL (ref 0.4–1.2)
GFR: 78.23 mL/min (ref >60.00)
Glucose, Bld: 110 mg/dL — ABNORMAL HIGH (ref 70–99)

## 2012-06-20 LAB — HEMOGLOBIN A1C: Hgb A1c MFr Bld: 6.4 % (ref 4.6–6.5)

## 2012-06-20 MED ORDER — LOSARTAN POTASSIUM 50 MG PO TABS: 50.0000 mg | ORAL_TABLET | Freq: Every day | ORAL | Status: DC

## 2012-06-20 NOTE — Progress Notes (Signed)
HPI  I have personally reviewed the Medicare Annual Wellness questionnaire and have noted  1. The patient's medical and social history  2. Their use of alcohol, tobacco or illicit drugs  3. Their current medications and supplements  4. The patient's functional ability including ADL's, fall risks, home safety risks and hearing or visual  impairment.  5. Diet and physical activities  6. Evidence for depression or mood disorders  The patients weight, height, BMI and visual acuity have been recorded in the chart  I have made referrals, counseling and provided education to the patient based review of the above and I have provided the pt with a written personalized care plan for preventive services.   Elevated Cholesterol: Above goal at last check on lipitor.  Lab Results  Component Value Date   CHOL 137 02/04/2012   HDL 40.50 02/04/2012   LDLCALC 72 02/04/2012   TRIG 121.0 02/04/2012   CHOLHDL 3 02/04/2012    Using medications without problems:  Muscle aches:  Diet compliance:poor  Exercise:None due to recovering from hematoma  Other complaints:   Hypertension:Moderate control on lisinopril 40 mg, metoprolol, lasix, cardizem, lanoxin, Dr Mariah Milling suggested she take clonidine 0.1 mg twice a day, even 3 times a day if tolerated at last OV on 2/4. She stopped clonidine because severe headache. He also started HCTZ 25 mg daily with a BMP in 2 weeks on return visit (she never did this, so we will check today). She is also taking extra 1/2 tab of lisinopril every day except today. Has follow up on 2/27 with Dr. Mariah Milling.  She was unable to tolerate hydralazine because of headache.  Now only occasional episodes of heart racing. Has not needed any extra doses of diltiazem for heart racing.  Using medication without problems or lightheadedness: None  Chest pain with exertion:None  Edema:None  Short of breath:None  Average home BPs:  169/87  Other issues:Afib, CHF: euvolemic   Diabetes: Due for  re-eval. no medicaitons.  Lab Results  Component Value Date   HGBA1C 6.3 02/04/2012  Hypoglycemic episodes:None  Hyperglycemic episodes:None  Feet problems:None  Blood Sugars averaging: FBS: 113-120 eye exam within last year:yes   She continues to have issues with  She has new boil on left inner leg near buttocks x few days. Using hand soap for skins.  Review of Systems  Constitutional: Negative for fever and fatigue.  HENT: Negative for ear pain.  Eyes: Negative for pain.  Respiratory: Negative for chest tightness and shortness of breath.  Cardiovascular: Negative for chest pain, palpitations and leg swelling.  Gastrointestinal: Negative for abdominal pain.  Genitourinary: Negative for dysuria.  Neurological: Negative for syncope and light-headedness.  Psychiatric/Behavioral: Positive for dysphoric mood.  Objective:   Physical Exam  Constitutional: Vital signs are normal. She appears well-developed and well-nourished. She is cooperative. Non-toxic appearance. She does not appear ill. No distress.  HENT:  Head: Normocephalic.  Right Ear: Hearing, tympanic membrane, external ear and ear canal normal.  Left Ear: Hearing, tympanic membrane, external ear and ear canal normal.  Nose: Nose normal.  Eyes: Conjunctivae, EOM and lids are normal. Pupils are equal, round, and reactive to light. No foreign bodies found.  Neck: Trachea normal and normal range of motion. Neck supple. Carotid bruit is not present. No mass and no thyromegaly present.  Cardiovascular: Normal rate, S1 normal, S2 normal and intact distal pulses. An irregular rhythm present. Exam reveals distant heart sounds. Exam reveals no gallop.  No murmur heard. Slight B  peripheral edema  Pulmonary/Chest: Effort normal and breath sounds normal. No respiratory distress. She has no wheezes. She has no rhonchi. She has no rales.  Abdominal: Soft. Normal appearance and bowel sounds are normal. She exhibits no distension, no fluid  wave, no abdominal bruit and no mass. There is no hepatosplenomegaly. There is no tenderness. There is no rebound, no guarding and no CVA tenderness. No hernia.  Lymphadenopathy:  She has no cervical adenopathy.  She has no axillary adenopathy.  Neurological: She is alert. She has normal strength. No cranial nerve deficit or sensory deficit.  Skin: Skin is warm, dry and intact.  2 cystic lesions .Marland Kitchen One on labia ( noted in past) and one on left inner thigh, no erythema , no redness, no warmth. Psychiatric: Her speech is normal and behavior is normal. Judgment normal. Her mood appears not anxious. Cognition and memory are normal. She does not exhibit a depressed mood.  Diabetic foot exam:  Normal inspection  No skin breakdown  No calluses  Normal DP pulses  Normal sensation to light touch and monofilament  Nails thickened  Assessment & Plan:   AMW: The patient's preventative maintenance and recommended screening tests for an annual wellness exam were reviewed in full today.  Brought up to date unless services declined.  Counselled on the importance of diet, exercise, and its role in overall health and mortality.  The patient's FH and SH was reviewed, including their home life, tobacco status, and drug and alcohol status.   Vaccines:Uptodate with flu, PNA and Td, considering shingles vaccine.  No indication for pap, DVE given age, hysterectomy.  Mammo: No family history of first degree relative. She wishes to continue mammograms.Colon: Nml in 2009, per Dr. Arlyce Dice repeat in 5 years (2014)  Nonsmoker.  DEXA: osteopenia, improved last check in 02/2009

## 2012-06-20 NOTE — Patient Instructions (Addendum)
Consider shingles vaccine. Set up living will and HCPOA. Stop higher dose of lisinopril and return  to 40 mg daily. Start losartan daily.  Continue to hold colonidine at this point until you follow up with Dr. Mariah Milling for his recommendations.  Set up mammogram on your own. Perform sitz baths on lesion 2-3 times a day. Call if not resolving or improving in the next 3-4 days or if fever. Change soap to antibacterial like lever 2000 or dial. Sanitize bathroom and tub/shower with bleach. Wash clothes and rags in hot water and bleach.

## 2012-06-22 DIAGNOSIS — L0293 Carbuncle, unspecified: Secondary | ICD-10-CM | POA: Insufficient documentation

## 2012-06-22 NOTE — Assessment & Plan Note (Signed)
She has stayed on very high dose lisinopril for longer than directed by cardiology RN. BP is better controlled but this is not a safe longterm dose. Stop higher dose of lisinopril and return  to 40 mg daily.  As far as I can see she has never tried an ARB. Start losartan daily.  Continue to hold colonidine at this point until you follow up with Dr. Mariah Milling for his recommendations.

## 2012-06-22 NOTE — Assessment & Plan Note (Signed)
Well controlled at last check on statin medication.

## 2012-06-22 NOTE — Assessment & Plan Note (Signed)
Due for re-eval. 

## 2012-06-22 NOTE — Assessment & Plan Note (Signed)
Current are is a very tiny cystic lesion without erythema, discharge or warmth... Possibly a cyst.  She also has a cyst on her labia higher up that appears nml. Will manage current lesion with sitz baths and I reviewed preventative method to eliminate skin bacteria colonization.

## 2012-06-24 ENCOUNTER — Ambulatory Visit (INDEPENDENT_AMBULATORY_CARE_PROVIDER_SITE_OTHER): Payer: Medicare PPO | Admitting: Cardiovascular Disease

## 2012-06-24 ENCOUNTER — Encounter: Payer: Self-pay | Admitting: Cardiovascular Disease

## 2012-06-24 VITALS — BP 200/100 | HR 65 | Ht 64.5 in | Wt 178.5 lb

## 2012-06-24 DIAGNOSIS — I4891 Unspecified atrial fibrillation: Secondary | ICD-10-CM

## 2012-06-24 DIAGNOSIS — I429 Cardiomyopathy, unspecified: Secondary | ICD-10-CM

## 2012-06-24 DIAGNOSIS — E663 Overweight: Secondary | ICD-10-CM

## 2012-06-24 DIAGNOSIS — E785 Hyperlipidemia, unspecified: Secondary | ICD-10-CM

## 2012-06-24 DIAGNOSIS — E118 Type 2 diabetes mellitus with unspecified complications: Secondary | ICD-10-CM

## 2012-06-24 DIAGNOSIS — I10 Essential (primary) hypertension: Secondary | ICD-10-CM

## 2012-06-24 DIAGNOSIS — R609 Edema, unspecified: Secondary | ICD-10-CM

## 2012-06-24 MED ORDER — HYDRALAZINE HCL 25 MG PO TABS: 25.0000 mg | ORAL_TABLET | Freq: Three times a day (TID) | ORAL | Status: DC

## 2012-06-24 NOTE — Assessment & Plan Note (Signed)
We have encouraged continued exercise, careful diet management in an effort to lose weight. 

## 2012-06-24 NOTE — Assessment & Plan Note (Signed)
Rate is well controlled. We'll continue warfarin and metoprolol. She reports that she only takes one half pill of her metoprolol which I think is adequate.

## 2012-06-24 NOTE — Assessment & Plan Note (Signed)
Stable, will try to avoid Ca channel blockers if possible.

## 2012-06-24 NOTE — Assessment & Plan Note (Signed)
Cholesterol is at goal on the current lipid regimen. No changes to the medications were made.  

## 2012-06-24 NOTE — Progress Notes (Signed)
Patient ID: Angela Ali, female    DOB: December 08, 1928, 79 y.o.   MRN: [id number]  HPI Comments: Angela Ali is a very pleasant 79 year old woman with a history of chronic atrial fibrillation on warfarin, hypertension, chronic lower extremity edema, shortness of breath, who presents for routine followup. She had a fall with hematoma  In the past,  required care from the wound clinic for a large ulcerative wound on the right lateral aspect of her leg. This has healed well. She has been having a chair ranging time with her hypertension management.  She was recently seen by Dr. Ermalene Searing and started on losartan. She had a headache recently and stopped her clonidine. In the past, she has stopped other medications for headache.  Initial blood pressure today is 200 systolic. We'll recheck by myself it is at least 160 systolic. She does not know what her numbers are at home. There is some medication confusion and she is trying to do her medications on her own. She does have other pains including left flank pain, itching on the left side of her back.    Calcium channel blocker/diltiazem was held in the past secondary to edema.  She takes her Lasix periodically.   She denies any significant palpitations.  No episodes of chest pain, lightheadedness or dizziness. She is walking with a walker.   EKG shows atrial fibrillation with ventricular rate of 65 beats per minute, no significant ST or T wave changes, poor R-wave progression to the anterior precordial leads    Outpatient Encounter Prescriptions as of 05/31/2012  Medication Sig Dispense Refill  . Ascorbic Acid (VITAMIN C PO) Take 1 tablet by mouth daily.      Marland Kitchen atorvastatin (LIPITOR) 80 MG tablet Take 80 mg by mouth daily.      . benzonatate (TESSALON) 100 MG capsule TAKE 2 CAPSULES (200 MG TOTAL) BY MOUTH 3 (THREE) TIMES DAILY AS NEEDED FOR COUGH.  90 capsule  0  . Cholecalciferol (VITAMIN D-3 PO) Take 1 tablet by mouth daily.      . cloNIDine (CATAPRES) 0.1  MG tablet Take 0.1 mg by mouth daily.       . digoxin (LANOXIN) 0.125 MG tablet Take 1 tablet (0.125 mg total) by mouth daily.  3 tablet  0  . diltiazem (CARDIZEM) 30 MG tablet Take 1 tablet (30 mg total) by mouth 4 (four) times daily as needed. For fast heart beat  12 tablet  0  . furosemide (LASIX) 80 MG tablet Take 1 tablet (80 mg total) by mouth daily as needed. For excessive fluid  3 tablet  0  . lisinopril (PRINIVIL,ZESTRIL) 40 MG tablet Take 1 tablet (40 mg total) by mouth daily.  30 tablet  0  . metoprolol (TOPROL-XL) 200 MG 24 hr tablet Take 1 tablet (200 mg total) by mouth daily.  3 tablet  0  . omeprazole (PRILOSEC) 20 MG capsule Take 20 mg by mouth daily.      . potassium chloride SA (K-DUR,KLOR-CON) 20 MEQ tablet Take 20 mEq by mouth daily as needed.      . warfarin (COUMADIN) 2.5 MG tablet Take 2.5 mg by mouth See admin instructions. Take 2.5 mg on Tuesday, Thursday, and Saturdays (alternating with 5mg  on other days)      . warfarin (COUMADIN) 5 MG tablet Take 1 tablet (5 mg total) by mouth See admin instructions. Take 5 mg on Mon, Wed, Fri, Sunday (2.5 mg on other days)  3 tablet  0  Review of Systems  Constitutional: Negative.   HENT: Negative.   Eyes: Negative.   Respiratory: Negative.   Gastrointestinal: Negative.   Musculoskeletal: Positive for gait problem.  Skin: Negative.   Neurological: Negative.   Psychiatric/Behavioral: Negative.   All other systems reviewed and are negative.   BP 200/100  Pulse 65  Ht 5' 4.5" (1.638 m)  Wt 178 lb 8 oz (80.967 kg)  BMI 30.18 kg/m2 Repeat blood pressure systolic 165  Physical Exam  Nursing note and vitals reviewed. Constitutional: She is oriented to person, place, and time. She appears well-developed and well-nourished.  Obese   HENT:  Head: Normocephalic.  Nose: Nose normal.  Mouth/Throat: Oropharynx is clear and moist.  Eyes: Conjunctivae are normal. Pupils are equal, round, and reactive to light.  Neck: Normal  range of motion. Neck supple. No JVD present.  Cardiovascular: Normal rate, regular rhythm, S1 normal, S2 normal, normal heart sounds and intact distal pulses.  Exam reveals no gallop and no friction rub.   No murmur heard. Pulmonary/Chest: Effort normal and breath sounds normal. No respiratory distress. She has no wheezes. She has no rales. She exhibits no tenderness.  Abdominal: Soft. Bowel sounds are normal. She exhibits no distension. There is no tenderness.  Musculoskeletal: Normal range of motion. She exhibits edema. She exhibits no tenderness.  Lymphadenopathy:    She has no cervical adenopathy.  Neurological: She is alert and oriented to person, place, and time. Coordination normal.  Skin: Skin is warm and dry. No rash noted. No erythema.  Psychiatric: She has a normal mood and affect. Her behavior is normal. Judgment and thought content normal.    Assessment and Plan

## 2012-06-24 NOTE — Patient Instructions (Addendum)
Please retry clonidine one in the Am and PM  Please take hydralazine 25 mg as needed for high blood pressure, ok to take three times a day as needed  and repeat as needed  Please write down the blood pressure  Nurse visit in two weeks for BP check  Please call us if you have new issues that need to be addressed before your next appt.  Your physician wants you to follow-up in: 1 month.

## 2012-06-24 NOTE — Assessment & Plan Note (Signed)
Blood pressure continues to run high. Recently stopped clonidine because of a headache one week ago. Concerned about rebound hypertension off the clonidine also medication confusion. We have suggested to her we arranged for a home nurse for polypharmacy. We have suggested she try clonidine 0.1 mg twice a day one more time. Given she has headaches with several medications, concerning her headaches may be unrelated to the medication. We have suggested she retry hydralazine 25 mg one more time for emergencies only, systolic pressure greater than 170, with repeat if necessary.   We will have her come back for a nurse visit in 2 weeks, it has been recommended she write down her blood pressures on a piece of paper. I will see her back in followup in one month.

## 2012-06-27 ENCOUNTER — Ambulatory Visit: Payer: Medicare PPO

## 2012-06-28 ENCOUNTER — Encounter: Payer: Self-pay | Admitting: Family Medicine

## 2012-06-28 ENCOUNTER — Other Ambulatory Visit: Payer: Self-pay | Admitting: *Deleted

## 2012-06-28 MED ORDER — BENZONATATE 100 MG PO CAPS: ORAL_CAPSULE | ORAL | Status: DC

## 2012-06-28 NOTE — Telephone Encounter (Signed)
Ok to refill 

## 2012-06-29 ENCOUNTER — Encounter: Payer: Self-pay | Admitting: *Deleted

## 2012-07-04 ENCOUNTER — Encounter: Payer: Self-pay | Admitting: Family Medicine

## 2012-07-05 ENCOUNTER — Encounter: Payer: Self-pay | Admitting: *Deleted

## 2012-07-05 ENCOUNTER — Encounter: Payer: Self-pay | Admitting: Family Medicine

## 2012-07-07 ENCOUNTER — Telehealth: Payer: Self-pay

## 2012-07-07 NOTE — Telephone Encounter (Signed)
Pt is coming tomorrow for bp check tomorrow, and has some questions. Please call.

## 2012-07-07 NOTE — Telephone Encounter (Signed)
I spoke with the patient. She states it is hard for her to get here just for a blood pressure check. She has a monitor at home. I have advised she may take this on her monitor in the morning about 30 minutes- 1 hour after her meds are taken. She should call us with the reading. I have also suggested that when she sees Dr. Mariah Milling back, she bring her list of readings she has recorded and also bring her BP monitor to correlate against our reading. She is agreeable.

## 2012-07-11 ENCOUNTER — Ambulatory Visit: Payer: Medicare PPO

## 2012-07-11 ENCOUNTER — Other Ambulatory Visit: Payer: Self-pay | Admitting: *Deleted

## 2012-07-11 ENCOUNTER — Ambulatory Visit (INDEPENDENT_AMBULATORY_CARE_PROVIDER_SITE_OTHER): Payer: Medicare PPO | Admitting: General Practice

## 2012-07-11 DIAGNOSIS — I4891 Unspecified atrial fibrillation: Secondary | ICD-10-CM

## 2012-07-11 DIAGNOSIS — Z7902 Long term (current) use of antithrombotics/antiplatelets: Secondary | ICD-10-CM

## 2012-07-11 DIAGNOSIS — Z452 Encounter for adjustment and management of vascular access device: Secondary | ICD-10-CM

## 2012-07-11 LAB — POCT INR: INR: 3.1

## 2012-07-11 MED ORDER — LOSARTAN POTASSIUM 50 MG PO TABS: 50.0000 mg | ORAL_TABLET | Freq: Every day | ORAL | Status: DC

## 2012-07-11 MED ORDER — HYDROCHLOROTHIAZIDE 25 MG PO TABS: 25.0000 mg | ORAL_TABLET | Freq: Every day | ORAL | Status: DC

## 2012-07-11 MED ORDER — ATORVASTATIN CALCIUM 80 MG PO TABS: 80.0000 mg | ORAL_TABLET | Freq: Every day | ORAL | Status: DC

## 2012-07-13 ENCOUNTER — Telehealth: Payer: Self-pay

## 2012-07-13 NOTE — Telephone Encounter (Signed)
lmtcb re:bp readings

## 2012-07-14 NOTE — Telephone Encounter (Signed)
Pt says blood pressures last week were "good" Some readings have been 118/73, 150/91

## 2012-07-18 ENCOUNTER — Ambulatory Visit: Payer: Medicare PPO

## 2012-07-25 ENCOUNTER — Ambulatory Visit (INDEPENDENT_AMBULATORY_CARE_PROVIDER_SITE_OTHER): Payer: Medicare PPO | Admitting: Cardiovascular Disease

## 2012-07-25 ENCOUNTER — Encounter: Payer: Self-pay | Admitting: Cardiovascular Disease

## 2012-07-25 VITALS — BP 162/98 | HR 80 | Ht 65.0 in | Wt 179.5 lb

## 2012-07-25 DIAGNOSIS — E118 Type 2 diabetes mellitus with unspecified complications: Secondary | ICD-10-CM

## 2012-07-25 DIAGNOSIS — I4891 Unspecified atrial fibrillation: Secondary | ICD-10-CM

## 2012-07-25 DIAGNOSIS — M545 Low back pain, unspecified: Secondary | ICD-10-CM

## 2012-07-25 DIAGNOSIS — I429 Cardiomyopathy, unspecified: Secondary | ICD-10-CM

## 2012-07-25 DIAGNOSIS — E785 Hyperlipidemia, unspecified: Secondary | ICD-10-CM

## 2012-07-25 DIAGNOSIS — I10 Essential (primary) hypertension: Secondary | ICD-10-CM

## 2012-07-25 DIAGNOSIS — I509 Heart failure, unspecified: Secondary | ICD-10-CM

## 2012-07-25 NOTE — Patient Instructions (Addendum)
You are doing well. No medication changes were made.  We will arrange a home nurse for medication help/polypharmacy   Please call us if you have new issues that need to be addressed before your next appt.  Your physician wants you to follow-up in: 3 months.  You will receive a reminder letter in the mail two months in advance. If you don't receive a letter, please call our office to schedule the follow-up appointment.

## 2012-07-25 NOTE — Assessment & Plan Note (Signed)
No clinical signs of heart failure.continue HCTZ. Takes Lasix when necessary

## 2012-07-25 NOTE — Progress Notes (Signed)
Patient ID: Angela Ali, female    DOB: 1928-07-22, 79 y.o.   MRN: [id number]  HPI Comments: Angela Ali is a very pleasant 79 year old woman with a history of chronic atrial fibrillation on warfarin, hypertension, chronic lower extremity edema, shortness of breath, who presents for routine followup. History of fall with hematoma of the right lower extremity,  required care from the wound clinic for a large ulcerative wound on the right lateral aspect of her leg. This has healed well. Well-controlled hypertension.   Daughter presents with her today and reports there is some medication confusion and noncompliance.Daughter is concerned about the number of medications and is requesting a home nurse/pharmacist to help with medication titration and blood pressure control. Patient reports that she takes her medications but sometimes forgets. Has not been taking cholesterol medication a regular basis. Daughter also interested in other forms of anticoagulation.  She denies any significant palpitations.  No episodes of chest pain, lightheadedness or dizziness. Should be walking with a walker but does not like to use it  She does complain of some right flank pain. Denies any recent falls   Calcium channel blocker/diltiazem was held in the past secondary to edema.  She takes her Lasix periodically.   EKG shows atrial fibrillation with ventricular rate of 80 beats per minute, no significant ST or T wave changes, poor R-wave progression to the anterior precordial leads    Outpatient Encounter Prescriptions as of 07/25/2012  Medication Sig Dispense Refill  . Ascorbic Acid (VITAMIN C PO) Take 1 tablet by mouth daily.      Marland Kitchen atorvastatin (LIPITOR) 80 MG tablet Take 1 tablet (80 mg total) by mouth daily.  90 tablet  3  . benzonatate (TESSALON) 100 MG capsule TAKE TWO CAPSULES BY MOUTH THREE TIMES DAILY AS NEEDED FOR COUGH  78 capsule  0  . Cholecalciferol (VITAMIN D-3 PO) Take 1 tablet by mouth daily.      .  cloNIDine (CATAPRES) 0.1 MG tablet Take 0.05 mg by mouth 2 (two) times daily.       . digoxin (LANOXIN) 0.125 MG tablet Take 1 tablet (0.125 mg total) by mouth daily.  3 tablet  0  . diltiazem (CARDIZEM) 30 MG tablet Take 1 tablet (30 mg total) by mouth 4 (four) times daily as needed. For fast heart beat  12 tablet  0  . furosemide (LASIX) 80 MG tablet Take 1 tablet (80 mg total) by mouth daily as needed. For excessive fluid  3 tablet  0  . hydrALAZINE (APRESOLINE) 25 MG tablet Take 1 tablet (25 mg total) by mouth 3 (three) times daily.  90 tablet  6  . hydrochlorothiazide (HYDRODIURIL) 25 MG tablet Take 25 mg by mouth daily as needed.      Marland Kitchen lisinopril (PRINIVIL,ZESTRIL) 40 MG tablet Take 1 tablet (40 mg total) by mouth daily.  30 tablet  0  . losartan (COZAAR) 50 MG tablet Take 1 tablet (50 mg total) by mouth daily.  90 tablet  3  . metoprolol (TOPROL-XL) 200 MG 24 hr tablet Take 100 mg by mouth daily.      Marland Kitchen omeprazole (PRILOSEC) 20 MG capsule Take 20 mg by mouth daily.      . potassium chloride SA (K-DUR,KLOR-CON) 20 MEQ tablet Take 20 mEq by mouth daily as needed.      . warfarin (COUMADIN) 2.5 MG tablet Take 2.5 mg by mouth See admin instructions. Take 2.5 mg on Tuesday, Thursday, and Saturdays (alternating with  5mg  on other days)      . warfarin (COUMADIN) 5 MG tablet Take 1 tablet (5 mg total) by mouth See admin instructions. Take 5 mg on Mon, Wed, Fri, Sunday (2.5 mg on other days)  3 tablet  0  . [DISCONTINUED] hydrochlorothiazide (HYDRODIURIL) 25 MG tablet Take 1 tablet (25 mg total) by mouth daily.  90 tablet  3   No facility-administered encounter medications on file as of 07/25/2012.     Review of Systems  Constitutional: Negative.   HENT: Negative.   Eyes: Negative.   Respiratory: Negative.   Cardiovascular: Negative.   Gastrointestinal: Negative.   Musculoskeletal: Positive for back pain and gait problem.  Skin: Negative.   Neurological: Negative.   Psychiatric/Behavioral:  Negative.   All other systems reviewed and are negative.   BP 162/98  Pulse 80  Ht 5\' 5"  (1.651 m)  Wt 179 lb 8 oz (81.421 kg)  BMI 29.87 kg/m2 Repeat blood pressure systolic 165  Physical Exam  Nursing note and vitals reviewed. Constitutional: She is oriented to person, place, and time. She appears well-developed and well-nourished.  Obese   HENT:  Head: Normocephalic.  Nose: Nose normal.  Mouth/Throat: Oropharynx is clear and moist.  Eyes: Conjunctivae are normal. Pupils are equal, round, and reactive to light.  Neck: Normal range of motion. Neck supple. No JVD present.  Cardiovascular: Normal rate, regular rhythm, S1 normal, S2 normal, normal heart sounds and intact distal pulses.  Exam reveals no gallop and no friction rub.   No murmur heard. Pulmonary/Chest: Effort normal and breath sounds normal. No respiratory distress. She has no wheezes. She has no rales. She exhibits no tenderness.  Abdominal: Soft. Bowel sounds are normal. She exhibits no distension. There is no tenderness.  Musculoskeletal: Normal range of motion. She exhibits edema. She exhibits no tenderness.  Lymphadenopathy:    She has no cervical adenopathy.  Neurological: She is alert and oriented to person, place, and time. Coordination normal.  Skin: Skin is warm and dry. No rash noted. No erythema.  Psychiatric: She has a normal mood and affect. Her behavior is normal. Judgment and thought content normal.    Assessment and Plan

## 2012-07-25 NOTE — Assessment & Plan Note (Signed)
We will request a home nurse to help with medication/polypharmacy, medication titration and communication with our office. Daughter reports medication noncompliance and confusion.

## 2012-07-25 NOTE — Addendum Note (Signed)
Addended by: Amardeep Beckers E on: 07/25/2012 02:00 PM   Modules accepted: Orders

## 2012-07-25 NOTE — Assessment & Plan Note (Signed)
Heart rate is well controlled, on anticoagulation. No recent falls.

## 2012-07-25 NOTE — Assessment & Plan Note (Signed)
Presenting with low back pain, particularly on the right flank.

## 2012-07-25 NOTE — Assessment & Plan Note (Signed)
We have encouraged continued exercise, careful diet management in an effort to lose weight. Chronic knee pain limits her exercise

## 2012-07-25 NOTE — Assessment & Plan Note (Signed)
She is noncompliant with her generic Lipitor.

## 2012-07-26 ENCOUNTER — Ambulatory Visit (INDEPENDENT_AMBULATORY_CARE_PROVIDER_SITE_OTHER): Payer: Medicare PPO | Admitting: Family Medicine

## 2012-07-26 ENCOUNTER — Ambulatory Visit (INDEPENDENT_AMBULATORY_CARE_PROVIDER_SITE_OTHER)
Admission: RE | Admit: 2012-07-26 | Discharge: 2012-07-26 | Disposition: A | Discharge status: Home / Self Care | Payer: Medicare PPO | Source: Ambulatory Visit | Attending: Family Medicine | Admitting: Family Medicine

## 2012-07-26 ENCOUNTER — Encounter: Payer: Self-pay | Admitting: Family Medicine

## 2012-07-26 VITALS — BP 160/90 | Temp 98.5°F | Wt 180.0 lb

## 2012-07-26 DIAGNOSIS — M545 Low back pain, unspecified: Secondary | ICD-10-CM

## 2012-07-26 DIAGNOSIS — M79604 Pain in right leg: Secondary | ICD-10-CM

## 2012-07-26 NOTE — Progress Notes (Signed)
  Subjective:    Patient ID: Angela Ali, female    DOB: 11/28/1928, 79 y.o.   MRN: [id number]  HPI  79 year old female with well controlled DM, afib, HTN as well as  Chronic constipation and chronic intermittent LEFT abdominal pain.. Found on past CT abd to have innumerable splenic lesions.. Pt chose to not biopsy given risk of hemorrhage.  She presents today with new pain in RIGHT side, lateral and posterior. Intermittant,seconds to minutes.  Ongoing 1 week  More in back then in abdomen Pain is 8/10, burning could not sleep. Radiates down right side to buttock cheek then down to right foot. No numbness, no weakness. No N/V,no diarrhea. No constipation. No blood in urine or stool. No dysuria, drinks a lot of water. Feels better with leaning forward. Used aspercream  ( didn't help) and heat (Made it worse)  No fall or injury.  Using aleve which did not help at all.    Review of Systems  Constitutional: Positive for fatigue. Negative for fever.  HENT: Negative for ear pain.   Eyes: Negative for pain.  Respiratory: Negative for chest tightness and shortness of breath.   Cardiovascular: Negative for chest pain, palpitations and leg swelling.  Gastrointestinal: Negative for abdominal pain.  Genitourinary: Negative for dysuria.       Objective:   Physical Exam  Constitutional: Vital signs are normal. She appears well-developed and well-nourished. She is cooperative.  Non-toxic appearance. She does not appear ill. No distress.  HENT:  Head: Normocephalic.  Right Ear: Hearing, tympanic membrane, external ear and ear canal normal. Tympanic membrane is not erythematous, not retracted and not bulging.  Left Ear: Hearing, tympanic membrane, external ear and ear canal normal. Tympanic membrane is not erythematous, not retracted and not bulging.  Nose: No mucosal edema or rhinorrhea. Right sinus exhibits no maxillary sinus tenderness and no frontal sinus tenderness. Left sinus exhibits no  maxillary sinus tenderness and no frontal sinus tenderness.  Mouth/Throat: Uvula is midline, oropharynx is clear and moist and mucous membranes are normal.  Eyes: Conjunctivae, EOM and lids are normal. Pupils are equal, round, and reactive to light. No foreign bodies found.  Neck: Trachea normal and normal range of motion. Neck supple. Carotid bruit is not present. No mass and no thyromegaly present.  Cardiovascular: Normal rate, regular rhythm, S1 normal, S2 normal, normal heart sounds, intact distal pulses and normal pulses.  Exam reveals no gallop and no friction rub.   No murmur heard. Pulmonary/Chest: Effort normal and breath sounds normal. Not tachypneic. No respiratory distress. She has no decreased breath sounds. She has no wheezes. She has no rhonchi. She has no rales.  Abdominal: Soft. Normal appearance and bowel sounds are normal. There is no hepatosplenomegaly. There is no tenderness. There is no CVA tenderness.  Musculoskeletal:       Lumbar back: She exhibits decreased range of motion and tenderness. She exhibits no bony tenderness.  Positive right SLR, neg Faber's  Neurological: She is alert. She has normal strength. No cranial nerve deficit or sensory deficit.  Skin: Skin is warm, dry and intact. No rash noted.  Psychiatric: Her speech is normal and behavior is normal. Judgment and thought content normal. Her mood appears not anxious. Cognition and memory are normal. She does not exhibit a depressed mood.          Assessment & Plan:

## 2012-07-26 NOTE — Patient Instructions (Addendum)
We will call you about X-ray results. Call me if new symptoms. Gentle stretching exercises, icy hot, Tylenol for pain (do not use aleve given you are on coumadin), if pain not controlled call. Follow up in 2 week if not better.

## 2012-07-27 ENCOUNTER — Telehealth: Payer: Self-pay | Admitting: Family Medicine

## 2012-07-27 DIAGNOSIS — M545 Low back pain: Secondary | ICD-10-CM

## 2012-07-27 DIAGNOSIS — M79604 Pain in right leg: Secondary | ICD-10-CM

## 2012-07-27 NOTE — Telephone Encounter (Signed)
Message copied by Excell Seltzer on Wed Jul 27, 2012  4:29 PM ------      Message from: Consuello Masse      Created: Wed Jul 27, 2012  2:32 PM       Patient advised and is okay referal to PT ------

## 2012-07-31 NOTE — Assessment & Plan Note (Addendum)
Pain from back not abdomen.  Given age and osteoporosis... Will eval with X-ray of lumbar spine. Treat with heat, stretching, pain control and tylenol as NSAIDs are contraindicated on coumadin (also pt with SE to narcotics),

## 2012-08-01 ENCOUNTER — Telehealth: Payer: Self-pay

## 2012-08-01 NOTE — Telephone Encounter (Signed)
Angela Ali from [facility] states there was no nurse visit due to pt not home. States pt was at the  [facility] And they have r/s another visit for  4/10.Marland KitchenMarland KitchenFYI

## 2012-08-04 ENCOUNTER — Telehealth: Payer: Self-pay

## 2012-08-04 NOTE — Telephone Encounter (Signed)
Angela Ali PT with [facility] left v/m said initially wanted PT referral for home health PT; pt has decided to go to outpatient physical therapy. There will be no home health physical therapy; Angela Ali does not need call back.

## 2012-08-04 NOTE — Telephone Encounter (Signed)
Noted  

## 2012-08-08 ENCOUNTER — Ambulatory Visit (INDEPENDENT_AMBULATORY_CARE_PROVIDER_SITE_OTHER): Payer: Medicare PPO | Admitting: General Practice

## 2012-08-08 DIAGNOSIS — I4891 Unspecified atrial fibrillation: Secondary | ICD-10-CM

## 2012-08-08 DIAGNOSIS — Z452 Encounter for adjustment and management of vascular access device: Secondary | ICD-10-CM

## 2012-08-08 DIAGNOSIS — Z7902 Long term (current) use of antithrombotics/antiplatelets: Secondary | ICD-10-CM

## 2012-08-11 ENCOUNTER — Other Ambulatory Visit: Payer: Self-pay | Admitting: *Deleted

## 2012-08-11 MED ORDER — GLUCOSE BLOOD VI STRP
ORAL_STRIP | Status: DC
Start: 2012-08-11 — End: 2012-11-14

## 2012-08-11 MED ORDER — ACCU-CHEK MULTICLIX LANCETS MISC: Status: DC

## 2012-08-15 ENCOUNTER — Telehealth: Payer: Self-pay

## 2012-08-15 ENCOUNTER — Other Ambulatory Visit: Payer: Self-pay | Admitting: *Deleted

## 2012-08-15 MED ORDER — METOPROLOL SUCCINATE ER 200 MG PO TB24: 200.0000 mg | ORAL_TABLET | Freq: Every day | ORAL | Status: DC

## 2012-08-15 MED ORDER — LISINOPRIL 40 MG PO TABS: 40.0000 mg | ORAL_TABLET | Freq: Every day | ORAL | Status: DC

## 2012-08-15 NOTE — Telephone Encounter (Signed)
Pt has a question about her Lisinopril. Please call

## 2012-08-15 NOTE — Telephone Encounter (Signed)
FYI

## 2012-08-15 NOTE — Telephone Encounter (Signed)
Pt asks if she is to be taking both lisinopril and losartan, says [facility] is questioning this. I explained, based on Dr. Windell Hummingbird last note, she should be on both  She verb understanding and asks that a refill be sent to pharmacy I will send this in  I asked if someone from [facility] has been able to help her with medication administration. She says a nurse xcame out for 1 visit but explained Medicare will not pay for nurse for medication admin in home as well as PT outside of home (she is receiving PT through [facility]). [facility] told her she would need to get both PT and nursing care via [facility] for Medicare to pay, therefore she is not getting any assistance with medication admin. I will make Dr. Mariah Milling aware

## 2012-08-16 NOTE — Telephone Encounter (Signed)
Pt came in office with lisinopril bottle. She is concerned because there are 2 "different" pills in bottle. One is yellow and the other is pink. I checked Pill Identifier and confirmed both pills that are in lisinopril bottle are lisinopril from different manufacturers.  Pt was reassured.  She also tells me she does not think she needs help with her medications, that her daughter "thinks I cant manage", but is willing to consider help after she is done with PT sessions at [facility].  She is monitoring her BP at home and says BP yesterday was 140/101. I explained this is too high and she needs to let us know should BP continue to run this high. She will continue to monitor at home and call us back with #s

## 2012-08-26 ENCOUNTER — Other Ambulatory Visit: Payer: Self-pay

## 2012-08-26 MED ORDER — GLUCOSE BLOOD VI STRP: ORAL_STRIP | Status: DC

## 2012-08-26 NOTE — Telephone Encounter (Signed)
Pt said [facility] needs new rx for diabetic strips. Pt using accu chek aviva plus and checking BS twice a day. NOtified pt done.

## 2012-09-05 ENCOUNTER — Ambulatory Visit (INDEPENDENT_AMBULATORY_CARE_PROVIDER_SITE_OTHER): Payer: Medicare PPO | Admitting: General Practice

## 2012-09-05 DIAGNOSIS — I4891 Unspecified atrial fibrillation: Secondary | ICD-10-CM

## 2012-09-05 DIAGNOSIS — Z452 Encounter for adjustment and management of vascular access device: Secondary | ICD-10-CM

## 2012-09-05 DIAGNOSIS — Z7902 Long term (current) use of antithrombotics/antiplatelets: Secondary | ICD-10-CM

## 2012-09-05 LAB — POCT INR: INR: 2.4

## 2012-09-27 ENCOUNTER — Telehealth: Payer: Self-pay

## 2012-09-27 NOTE — Telephone Encounter (Signed)
Pt states she has a "shocking thing, emsi unit", is it ok to use this, they use it on her at [facility], wants to know if it is ok touse it

## 2012-09-27 NOTE — Telephone Encounter (Signed)
Pt using TENS unit on lower back at PT request I advised ok to use this Understanding verb

## 2012-09-29 ENCOUNTER — Telehealth: Payer: Self-pay

## 2012-09-29 NOTE — Telephone Encounter (Signed)
Patient advised.

## 2012-09-29 NOTE — Telephone Encounter (Signed)
Not required to serve at increased age.. Notify patient.

## 2012-09-29 NOTE — Telephone Encounter (Signed)
Pt left note with info for jury duty summons excuse requesting letter stating pt cannot participate as a juror. Jury [employer] on [address] [employer].

## 2012-10-03 ENCOUNTER — Ambulatory Visit (INDEPENDENT_AMBULATORY_CARE_PROVIDER_SITE_OTHER): Payer: Medicare PPO | Admitting: Family Medicine

## 2012-10-03 ENCOUNTER — Encounter: Payer: Self-pay | Admitting: Family Medicine

## 2012-10-03 VITALS — BP 130/84 | HR 64 | Temp 98.2°F | Ht 65.0 in | Wt 176.0 lb

## 2012-10-03 DIAGNOSIS — K449 Diaphragmatic hernia without obstruction or gangrene: Secondary | ICD-10-CM

## 2012-10-03 DIAGNOSIS — K219 Gastro-esophageal reflux disease without esophagitis: Secondary | ICD-10-CM

## 2012-10-03 DIAGNOSIS — Z452 Encounter for adjustment and management of vascular access device: Secondary | ICD-10-CM

## 2012-10-03 DIAGNOSIS — I4891 Unspecified atrial fibrillation: Secondary | ICD-10-CM

## 2012-10-03 DIAGNOSIS — R079 Chest pain, unspecified: Secondary | ICD-10-CM

## 2012-10-03 DIAGNOSIS — Z7902 Long term (current) use of antithrombotics/antiplatelets: Secondary | ICD-10-CM

## 2012-10-03 LAB — POCT INR: INR: 2.3

## 2012-10-03 MED ORDER — RANITIDINE HCL 150 MG PO TABS: 150.0000 mg | ORAL_TABLET | Freq: Two times a day (BID) | ORAL | Status: DC

## 2012-10-03 NOTE — Progress Notes (Signed)
Nature conservation officer at [facility] [address] [place] [address] Phone: [phone] Fax: [phone]  Date:  10/03/2012   Name:  Angela Ali   DOB:  1928/08/14   MRN:  [id number] Gender: female Age: 79 y.o.  Primary Physician:  Kerby Nora, MD  Evaluating MD: Hannah Beat, MD   Chief Complaint: Chest Pain   History of Present Illness:  Angela Ali is a 79 y.o. pleasant patient who presents with the following:  79 yo with multiple medical problems, DM2, CHF, A Fib, had some chest pain on Sat. Sat night, indigestion, drank some ginger ale. Took some tums, and helped a little and kept on burping all night. Can still feel it a little, but it is mostly better.  Onions and cucumbers on sat, and sometimes give indigestion.   No sob. No worsening CP or SOB with exertion.   Patient Active Problem List   Diagnosis Date Noted  . Low back pain radiating to right leg 07/26/2012  . Recurrent boils 06/22/2012  . Inclusion cyst 02/23/2012  . Chronic constipation 12/01/2011  . 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 Splenic mass 08/21/2011  . Hx of Hematoma of leg, with chronic leg weakness and numbness 09/15/2010  . ASTHMA, PERSISTENT, MODERATE 07/03/2009  . FATIGUE, CHRONIC 03/19/2009  . UNSPECIFIED VITAMIN D DEFICIENCY 02/26/2009  . LOW BACK PAIN, CHRONIC 11/19/2008  . OVERWEIGHT/OBESITY 10/02/2008  . CARDIOMYOPATHY, SECONDARY 10/02/2008  . ALLERGIC RHINITIS 03/23/2008  . BENIGN NEOPLASM OTH&UNSPEC SITE DIGESTIVE SYSTEM 02/01/2007  . STRICTURE, ESOPHAGEAL 02/01/2007  . Diabetes mellitus type 2 with complications 12/24/2006  . OSTEOPOROSIS 12/21/2006  . HYPERLIPIDEMIA 12/16/2006  . HYPERTENSION 12/16/2006  . Atrial fibrillation 12/16/2006  . CONGESTIVE HEART FAILURE 12/16/2006  . GERD 12/16/2006  . HIATAL HERNIA 12/16/2006  . OSTEOARTHRITIS 12/16/2006  . KNEE PAIN, RIGHT, CHRONIC 12/16/2006  . HERNIATED LUMBAR DISC 12/16/2006  . PERIPHERAL EDEMA 12/16/2006  . COLONIC  POLYPS, HYPERPLASTIC 08/26/2000  . DIVERTICULOSIS, COLON 08/26/2000    Past Medical History  Diagnosis Date  . Overweight   . Secondary cardiomyopathy, unspecified   . Atrial flutter   . Congestive heart failure, unspecified   . Edema   . Other and unspecified hyperlipidemia   . Unspecified essential hypertension   . Allergic rhinitis, cause unspecified   . Colonic polyp   . Diverticulosis of colon (without mention of hemorrhage)   . Other malaise and fatigue   . Stricture and stenosis of esophagus   . Bacterial pneumonia, unspecified   . Other abnormal glucose   . Esophageal reflux   . Osteoarthrosis, unspecified whether generalized or localized, unspecified site   . Displacement of lumbar intervertebral disc without myelopathy   . Diaphragmatic hernia without mention of obstruction or gangrene   . Long term (current) use of anticoagulants   . Atrial fibrillation   . Prediabetes   . Splenic mass 08/21/2011  . Thigh abscess     Past Surgical History  Procedure Laterality Date  . Esophageal dilation  01/2007  . Abdominal hysterectomy  1971    partial  . Ankle surgery  2001  . Cholecystectomy  2004    History   Social History  . Marital Status: Widowed    Spouse Name: N/A    Number of Children: 5  . Years of Education: N/A   Occupational History  . Retired Runner, broadcasting/film/video     retired Runner, broadcasting/film/video.  Used to live in [place], [place], now in [place] with her family  Social History Main Topics  . Smoking status: Never Smoker   . Smokeless tobacco: Never Used  . Alcohol Use: No  . Drug Use: No  . Sexually Active: No   Other Topics Concern  . Not on file   Social History Narrative   Lost spouse to cancer in 07/2006   5 children   Does not get regular exercise    Family History  Problem Relation Age of Onset  . Emphysema Father 47  . Coronary artery disease Mother 43  . Hypertension Mother 74  . Hyperlipidemia Sister     # 1  . Hyperlipidemia Sister     # 2    . Diabetes Daughter     # 1  . Hypertension Daughter   . Diabetes Daughter     # 2   . Hypertension Daughter   . Ovarian cancer Daughter   . Cancer Son     bone marrow ca    Allergies  Allergen Reactions  . Morphine And Related   . Percocet (Oxycodone-Acetaminophen) Nausea Only       . Tramadol Nausea Only    Medication list has been reviewed and updated.  Outpatient Prescriptions Prior to Visit  Medication Sig Dispense Refill  . Ascorbic Acid (VITAMIN C PO) Take 1 tablet by mouth daily.      Marland Kitchen atorvastatin (LIPITOR) 80 MG tablet Take 1 tablet (80 mg total) by mouth daily.  90 tablet  3  . benzonatate (TESSALON) 100 MG capsule TAKE TWO CAPSULES BY MOUTH THREE TIMES DAILY AS NEEDED FOR COUGH  78 capsule  0  . Cholecalciferol (VITAMIN D-3 PO) Take 1 tablet by mouth daily.      . cloNIDine (CATAPRES) 0.1 MG tablet Take 0.05 mg by mouth 2 (two) times daily.       . digoxin (LANOXIN) 0.125 MG tablet Take 1 tablet (0.125 mg total) by mouth daily.  3 tablet  0  . diltiazem (CARDIZEM) 30 MG tablet Take 1 tablet (30 mg total) by mouth 4 (four) times daily as needed. For fast heart beat  12 tablet  0  . furosemide (LASIX) 80 MG tablet Take 1 tablet (80 mg total) by mouth daily as needed. For excessive fluid  3 tablet  0  . glucose blood (ACCU-CHEK AVIVA PLUS) test strip 1 each by Other route 2 (two) times daily. 250.90  200 each  3  . glucose blood test strip Use as instructed  300 each  3  . hydrALAZINE (APRESOLINE) 25 MG tablet Take 1 tablet (25 mg total) by mouth 3 (three) times daily.  90 tablet  6  . hydrochlorothiazide (HYDRODIURIL) 25 MG tablet Take 25 mg by mouth daily as needed.      . Lancets (ACCU-CHEK MULTICLIX) lancets Use as instructed  300 each  3  . lisinopril (PRINIVIL,ZESTRIL) 40 MG tablet Take 1 tablet (40 mg total) by mouth daily.  90 tablet  3  . losartan (COZAAR) 50 MG tablet Take 1 tablet (50 mg total) by mouth daily.  90 tablet  3  . metoprolol (TOPROL-XL) 200  MG 24 hr tablet Take 1 tablet (200 mg total) by mouth daily.  90 tablet  3  . omeprazole (PRILOSEC) 20 MG capsule Take 20 mg by mouth daily.      . potassium chloride SA (K-DUR,KLOR-CON) 20 MEQ tablet Take 20 mEq by mouth daily as needed.      . warfarin (COUMADIN) 2.5 MG tablet Take 2.5 mg by  mouth See admin instructions. Take 2.5 mg on Tuesday, Thursday, and Saturdays (alternating with 5mg  on other days)      . warfarin (COUMADIN) 5 MG tablet Take 1 tablet (5 mg total) by mouth See admin instructions. Take 5 mg on Mon, Wed, Fri, Sunday (2.5 mg on other days)  3 tablet  0   No facility-administered medications prior to visit.    Review of Systems:   GEN: No acute illnesses, no fevers, chills. GI: No n/v/d, eating normally CV as above Pulm: No SOB Interactive and getting along well at home.  Otherwise, ROS is as per the HPI.   Physical Examination: BP 130/84  Pulse 64  Temp(Src) 98.2 F (36.8 C) (Oral)  Ht 5\' 5"  (1.651 m)  Wt 176 lb (79.833 kg)  BMI 29.29 kg/m2  SpO2 97%  Ideal Body Weight: Weight in (lb) to have BMI = 25: 149.9   GEN: WDWN, NAD, Non-toxic, A & O x 3 HEENT: Atraumatic, Normocephalic. Neck supple. No masses, No LAD. Ears and Nose: No external deformity. CV: irreg, irreg PULM: CTA B, no wheezes, crackles, rhonchi. No retractions. No resp. distress. No accessory muscle use. EXTR: No c/c/e NEURO Normal gait.  PSYCH: Normally interactive. Conversant. Not depressed or anxious appearing.  Calm demeanor.    Assessment and Plan:  Chest pain - Plan: EKG 12-Lead, EKG 12-Lead  GERD  HIATAL HERNIA  EKG: Atrial fib, No acute ST elevation or depression.   Seems to go along with indigestion / gerd vs esophageal spasm. In no distress and feeling better. Start Zantac, eat bland foods.   Call me if return of symptoms at all in the next couple days.  Orders Today:  Orders Placed This Encounter  Procedures  . EKG 12-Lead    Standing Status: Standing     Number  of Occurrences: 1     Standing Expiration Date:     Order Specific Question:  Reason for Exam    Answer:  chest pAIN    Updated Medication List: (Includes new medications, updates to list, dose adjustments) Meds ordered this encounter  Medications  . ranitidine (ZANTAC) 150 MG tablet    Sig: Take 1 tablet (150 mg total) by mouth 2 (two) times daily.    Dispense:  60 tablet    Refill:  0    Medications Discontinued: There are no discontinued medications.    Signed, Elpidio Galea. Samanta Gal, MD 10/03/2012 10:13 AM

## 2012-10-20 ENCOUNTER — Ambulatory Visit: Payer: Medicare PPO | Admitting: Cardiovascular Disease

## 2012-10-31 ENCOUNTER — Ambulatory Visit (INDEPENDENT_AMBULATORY_CARE_PROVIDER_SITE_OTHER): Payer: Medicare PPO | Admitting: Family Medicine

## 2012-10-31 DIAGNOSIS — I4891 Unspecified atrial fibrillation: Secondary | ICD-10-CM

## 2012-10-31 DIAGNOSIS — Z452 Encounter for adjustment and management of vascular access device: Secondary | ICD-10-CM

## 2012-10-31 DIAGNOSIS — Z7902 Long term (current) use of antithrombotics/antiplatelets: Secondary | ICD-10-CM

## 2012-10-31 LAB — POCT INR: INR: 2

## 2012-11-09 ENCOUNTER — Ambulatory Visit (INDEPENDENT_AMBULATORY_CARE_PROVIDER_SITE_OTHER): Payer: Medicare PPO | Admitting: Cardiovascular Disease

## 2012-11-09 ENCOUNTER — Encounter: Payer: Self-pay | Admitting: Cardiovascular Disease

## 2012-11-09 ENCOUNTER — Telehealth: Payer: Self-pay | Admitting: *Deleted

## 2012-11-09 VITALS — BP 180/100 | HR 67 | Ht 64.5 in | Wt 179.0 lb

## 2012-11-09 DIAGNOSIS — I10 Essential (primary) hypertension: Secondary | ICD-10-CM

## 2012-11-09 DIAGNOSIS — I4891 Unspecified atrial fibrillation: Secondary | ICD-10-CM

## 2012-11-09 DIAGNOSIS — E785 Hyperlipidemia, unspecified: Secondary | ICD-10-CM

## 2012-11-09 MED ORDER — METOPROLOL SUCCINATE ER 100 MG PO TB24: 100.0000 mg | ORAL_TABLET | Freq: Every day | ORAL | Status: DC

## 2012-11-09 MED ORDER — HYDROCHLOROTHIAZIDE 25 MG PO TABS: 25.0000 mg | ORAL_TABLET | Freq: Every day | ORAL | Status: DC

## 2012-11-09 MED ORDER — HYDRALAZINE HCL 50 MG PO TABS: 50.0000 mg | ORAL_TABLET | Freq: Three times a day (TID) | ORAL | Status: DC

## 2012-11-09 MED ORDER — LOSARTAN POTASSIUM 100 MG PO TABS: 100.0000 mg | ORAL_TABLET | Freq: Every day | ORAL | Status: DC

## 2012-11-09 NOTE — Telephone Encounter (Signed)
Patient has some questions about her new medication...unsure of what it is?

## 2012-11-09 NOTE — Assessment & Plan Note (Signed)
Cholesterol is at goal on the current lipid regimen. No changes to the medications were made.  

## 2012-11-09 NOTE — Patient Instructions (Addendum)
  Blood pressure is high  Please start hydralazine 50 mg  twice a day Please increase the losartan to 100 mg daily  Continue to monitor your blood pressure Call the office if your blood pressure runs high  For high blood pressure EMERGENCY, Take extra hydralazine  Please call us if you have new issues that need to be addressed before your next appt.  Your physician wants you to follow-up in: 3 months.  You will receive a reminder letter in the mail two months in advance. If you don't receive a letter, please call our office to schedule the follow-up appointment.

## 2012-11-09 NOTE — Assessment & Plan Note (Signed)
Heart rate well controlled, continue warfarin down from 200.

## 2012-11-09 NOTE — Progress Notes (Signed)
Patient ID: Angela Ali, female    DOB: 11-30-1928, 79 y.o.   MRN: [id number]  HPI Comments: [name] is a very pleasant 79 year old woman with a history of chronic atrial fibrillation on warfarin, hypertension, chronic lower extremity edema, shortness of breath, who presents for routine followup. History of fall with hematoma of the right lower extremity,  required care from the wound clinic for a large ulcerative wound on the right lateral aspect of her leg. This has healed well. History of medication confusion, some noncompliance.  On her last clinic visit, we tried to arrange a visiting nurse to help with medications. She reports that because she did not do physical [facility] with [facility], their services were not available. She does physical therapy with [facility].  Going to her medication list today, she reports that she's not taking hydralazine, not taking lisinopril, she takes one half metoprolol pale, she takes clonidine one half pill once a day, does not take any diltiazem. Blood pressures have been running 140 and higher at home. She denies any significant palpitations.  No episodes of chest pain, lightheadedness or dizziness. Should be walking with a walker but does not like to use it  Denies any recent falls She wears compression hose bilaterally.   Calcium channel blocker/diltiazem was held in the past secondary to edema.  She takes her Lasix periodically.   EKG shows atrial fibrillation with ventricular rate of 67 beats per minute, no significant ST or T wave changes, poor R-wave progression to the anterior precordial leads    Outpatient Encounter Prescriptions as of 11/09/2012  Medication Sig Dispense Refill  . Ascorbic Acid (VITAMIN C PO) Take 1 tablet by mouth daily.      Marland Kitchen atorvastatin (LIPITOR) 80 MG tablet Take 1 tablet (80 mg total) by mouth daily.  90 tablet  3  . benzonatate (TESSALON) 100 MG capsule TAKE TWO CAPSULES BY MOUTH THREE TIMES DAILY AS NEEDED FOR COUGH   78 capsule  0  . Cholecalciferol (VITAMIN D-3 PO) Take 1 tablet by mouth daily.      . cloNIDine (CATAPRES) 0.1 MG tablet Take 0.05 mg by mouth 2 (two) times daily.       . digoxin (LANOXIN) 0.125 MG tablet Take 1 tablet (0.125 mg total) by mouth daily.  3 tablet  0  . diltiazem (CARDIZEM) 30 MG tablet Take 1 tablet (30 mg total) by mouth 4 (four) times daily as needed. For fast heart beat  12 tablet  0  . furosemide (LASIX) 80 MG tablet Take 1 tablet (80 mg total) by mouth daily as needed. For excessive fluid  3 tablet  0  . glucose blood (ACCU-CHEK AVIVA PLUS) test strip 1 each by Other route 2 (two) times daily. 250.90  200 each  3  . glucose blood test strip Use as instructed  300 each  3  . hydrALAZINE (APRESOLINE) 25 MG tablet Take 25 mg by mouth daily.      . hydrochlorothiazide (HYDRODIURIL) 25 MG tablet Take 25 mg by mouth daily as needed.      . Lancets (ACCU-CHEK MULTICLIX) lancets Use as instructed  300 each  3  . losartan (COZAAR) 50 MG tablet Take 1 tablet (50 mg total) by mouth daily.  90 tablet  3  . metoprolol (TOPROL-XL) 200 MG 24 hr tablet Take 1 tablet (200 mg total) by mouth daily.  90 tablet  3  . omeprazole (PRILOSEC) 20 MG capsule Take 20 mg by  mouth daily.      . potassium chloride SA (K-DUR,KLOR-CON) 20 MEQ tablet Take 20 mEq by mouth daily as needed.      . ranitidine (ZANTAC) 150 MG tablet Take 1 tablet (150 mg total) by mouth 2 (two) times daily.  60 tablet  0  . warfarin (COUMADIN) 2.5 MG tablet Take 2.5 mg by mouth See admin instructions. Take 2.5 mg on Tuesday, Thursday, and Saturdays (alternating with 5mg  on other days)      . warfarin (COUMADIN) 5 MG tablet Take 1 tablet (5 mg total) by mouth See admin instructions. Take 5 mg on Mon, Wed, Fri, Sunday (2.5 mg on other days)  3 tablet  0  . [DISCONTINUED] hydrALAZINE (APRESOLINE) 25 MG tablet Take 1 tablet (25 mg total) by mouth 3 (three) times daily.  90 tablet  6  . [DISCONTINUED] lisinopril (PRINIVIL,ZESTRIL) 40  MG tablet Take 1 tablet (40 mg total) by mouth daily.  90 tablet  3   No facility-administered encounter medications on file as of 11/09/2012.     Review of Systems  Constitutional: Negative.   HENT: Negative.   Eyes: Negative.   Respiratory: Negative.   Cardiovascular: Negative.   Gastrointestinal: Negative.   Musculoskeletal: Positive for back pain and gait problem.  Skin: Negative.   Neurological: Negative.   Psychiatric/Behavioral: Negative.   All other systems reviewed and are negative.   BP 180/100  Pulse 67  Ht 5' 4.5" (1.638 m)  Wt 179 lb (81.194 kg)  BMI 30.26 kg/m2 Repeat blood pressure systolic 180  Physical Exam  Nursing note and vitals reviewed. Constitutional: She is oriented to person, place, and time. She appears well-developed and well-nourished.  Obese   HENT:  Head: Normocephalic.  Nose: Nose normal.  Mouth/Throat: Oropharynx is clear and moist.  Eyes: Conjunctivae are normal. Pupils are equal, round, and reactive to light.  Neck: Normal range of motion. Neck supple. No JVD present.  Cardiovascular: Normal rate, regular rhythm, S1 normal, S2 normal, normal heart sounds and intact distal pulses.  Exam reveals no gallop and no friction rub.   No murmur heard. Pulmonary/Chest: Effort normal and breath sounds normal. No respiratory distress. She has no wheezes. She has no rales. She exhibits no tenderness.  Abdominal: Soft. Bowel sounds are normal. She exhibits no distension. There is no tenderness.  Musculoskeletal: Normal range of motion. She exhibits edema. She exhibits no tenderness.  Lymphadenopathy:    She has no cervical adenopathy.  Neurological: She is alert and oriented to person, place, and time. Coordination normal.  Skin: Skin is warm and dry. No rash noted. No erythema.  Psychiatric: She has a normal mood and affect. Her behavior is normal. Judgment and thought content normal.    Assessment and Plan

## 2012-11-09 NOTE — Telephone Encounter (Signed)
Pt is aware of directions given by Dr. Mariah Milling at last ov 11/09/12  Please start hydralazine 50 mg twice a day  Please increase the losartan to 100 mg daily  Continue to monitor your blood pressure  Call the office if your blood pressure runs high  For high blood pressure EMERGENCY,  Take extra hydralazine

## 2012-11-09 NOTE — Assessment & Plan Note (Signed)
Blood pressure is very elevated today. She did not take any of her medications this morning. We have recommended that she increase her losartan to 100 mg daily, also start hydralazine 50 mg twice a day. She does not feel that she can take this 3 times a day. We will hold her lisinopril for now. She is taking clonidine only half dose sparingly as she believes this causes fatigue.

## 2012-11-14 ENCOUNTER — Telehealth: Payer: Self-pay

## 2012-11-14 ENCOUNTER — Other Ambulatory Visit: Payer: Self-pay | Admitting: *Deleted

## 2012-11-14 MED ORDER — ACCU-CHEK NANO SMARTVIEW W/DEVICE KIT: 1.0000 | PACK | Freq: Two times a day (BID) | Status: DC

## 2012-11-14 MED ORDER — GLUCOSE BLOOD VI STRP: ORAL_STRIP | Status: DC

## 2012-11-14 MED ORDER — ACCU-CHEK FASTCLIX LANCETS MISC: 1.0000 | Freq: Two times a day (BID) | Status: DC

## 2012-11-14 NOTE — Telephone Encounter (Signed)
Pt states that [facility] has tried to contact dr Mariah Milling in order to ship out her medications, but is unable to get through. Please call pt.

## 2012-11-14 NOTE — Telephone Encounter (Signed)
Returned call to pt advised we faxed back rx clarification on Metoprolol this am.  Pt clarifying instructions were to hold Lisinopril.  Advised pt per Dr Windell Hummingbird last OV note from 11/09/12 she is to hold Lisinopril at this time.  Pt verbalized understanding.

## 2012-11-24 ENCOUNTER — Encounter: Payer: Self-pay | Admitting: Gastroenterology

## 2012-11-29 ENCOUNTER — Other Ambulatory Visit: Payer: Self-pay | Admitting: *Deleted

## 2012-11-29 MED ORDER — DIGOXIN 125 MCG PO TABS: 0.1250 mg | ORAL_TABLET | Freq: Every day | ORAL | Status: DC

## 2012-11-29 MED ORDER — OMEPRAZOLE 20 MG PO CPDR: 20.0000 mg | DELAYED_RELEASE_CAPSULE | Freq: Every day | ORAL | Status: DC

## 2012-12-12 ENCOUNTER — Ambulatory Visit (INDEPENDENT_AMBULATORY_CARE_PROVIDER_SITE_OTHER): Payer: Medicare PPO | Admitting: Family Medicine

## 2012-12-12 DIAGNOSIS — Z452 Encounter for adjustment and management of vascular access device: Secondary | ICD-10-CM

## 2012-12-12 DIAGNOSIS — I4891 Unspecified atrial fibrillation: Secondary | ICD-10-CM

## 2012-12-12 DIAGNOSIS — Z7902 Long term (current) use of antithrombotics/antiplatelets: Secondary | ICD-10-CM

## 2012-12-12 LAB — POCT INR: INR: 2.6

## 2013-01-09 ENCOUNTER — Other Ambulatory Visit: Payer: Self-pay | Admitting: Family Medicine

## 2013-01-09 NOTE — Telephone Encounter (Signed)
Last office visit with you was on 07/26/2012.  Ok to refill?

## 2013-01-23 ENCOUNTER — Ambulatory Visit: Payer: Medicare PPO

## 2013-01-23 ENCOUNTER — Ambulatory Visit (INDEPENDENT_AMBULATORY_CARE_PROVIDER_SITE_OTHER): Payer: Medicare PPO | Admitting: Family Medicine

## 2013-01-23 DIAGNOSIS — Z7902 Long term (current) use of antithrombotics/antiplatelets: Secondary | ICD-10-CM

## 2013-01-23 DIAGNOSIS — Z452 Encounter for adjustment and management of vascular access device: Secondary | ICD-10-CM

## 2013-01-23 DIAGNOSIS — I4891 Unspecified atrial fibrillation: Secondary | ICD-10-CM

## 2013-01-23 LAB — POCT INR: INR: 2.7

## 2013-02-02 ENCOUNTER — Other Ambulatory Visit: Payer: Self-pay

## 2013-02-02 MED ORDER — DILTIAZEM HCL 30 MG PO TABS: 30.0000 mg | ORAL_TABLET | Freq: Four times a day (QID) | ORAL | Status: DC | PRN

## 2013-02-02 NOTE — Telephone Encounter (Signed)
Pt request refill diltiazem to [facility]; pt has not gotten filled since 03/2012,quantity # 12. Pt said she is not having fast heart beat now but wants to keep on hand if needed. Pt also has non prod cough that will not go away; pt scheduled appt with Dr Ermalene Searing on 02/03/13.

## 2013-02-03 ENCOUNTER — Encounter: Payer: Self-pay | Admitting: Family Medicine

## 2013-02-03 ENCOUNTER — Ambulatory Visit (INDEPENDENT_AMBULATORY_CARE_PROVIDER_SITE_OTHER): Payer: Medicare PPO | Admitting: Family Medicine

## 2013-02-03 VITALS — BP 140/90 | HR 64 | Temp 98.5°F | Ht 64.5 in | Wt 185.5 lb

## 2013-02-03 DIAGNOSIS — R05 Cough: Secondary | ICD-10-CM

## 2013-02-03 DIAGNOSIS — R059 Cough, unspecified: Secondary | ICD-10-CM

## 2013-02-03 DIAGNOSIS — R21 Rash and other nonspecific skin eruption: Secondary | ICD-10-CM

## 2013-02-03 DIAGNOSIS — J45909 Unspecified asthma, uncomplicated: Secondary | ICD-10-CM

## 2013-02-03 DIAGNOSIS — R053 Chronic cough: Secondary | ICD-10-CM

## 2013-02-03 DIAGNOSIS — Z23 Encounter for immunization: Secondary | ICD-10-CM

## 2013-02-03 DIAGNOSIS — J4520 Mild intermittent asthma, uncomplicated: Secondary | ICD-10-CM

## 2013-02-03 MED ORDER — DILTIAZEM HCL 30 MG PO TABS: 30.0000 mg | ORAL_TABLET | Freq: Four times a day (QID) | ORAL | Status: DC | PRN

## 2013-02-03 MED ORDER — BENZONATATE 200 MG PO CAPS: 200.0000 mg | ORAL_CAPSULE | Freq: Two times a day (BID) | ORAL | Status: DC | PRN

## 2013-02-03 NOTE — Patient Instructions (Addendum)
Okay to use benzonatate for cough.  Use steroid cream on back twice daily x 2 weeks. If not improving follow up.

## 2013-02-03 NOTE — Assessment & Plan Note (Signed)
Treat with OTC steroid cream.

## 2013-02-03 NOTE — Assessment & Plan Note (Signed)
PFTS nml today... changed dx to mild intermittant asthma. No need for controllers.

## 2013-02-03 NOTE — Assessment & Plan Note (Signed)
PFTs nml today. Most likely cough due to allergies, AM congestion. No SOB.  Okay to continue benzonatate prn.

## 2013-02-03 NOTE — Addendum Note (Signed)
Addended by: Damita Lack on: 02/03/2013 10:17 AM   Modules accepted: Orders

## 2013-02-03 NOTE — Progress Notes (Signed)
  Subjective:    Patient ID: Angela Ali, female    DOB: 26-Nov-1928, 79 y.o.   MRN: [id number]  Cough This is a chronic problem. The current episode started more than 1 year ago. The problem has been waxing and waning. The problem occurs every few hours. The cough is non-productive. Pertinent negatives include no ear congestion, ear pain, fever, headaches, hemoptysis, postnasal drip, sore throat, shortness of breath, weight loss or wheezing. Treatments tried: benzonatrate perles helps and she wants a frefill, that is why she is here. Her past medical history is significant for asthma and environmental allergies. There is no history of COPD.   Fells like she has to clear throat, worst in AM when she gets up.  Has history of Afib, asthma, moderate persistent on no controller. CXR clear in 2012. ? Last PFTs.  HAs itchy spot on lef tcentral back... Using occ cortisone cream daily. Helps some but comes back.   Review of Systems  Constitutional: Negative for fever and weight loss.  HENT: Negative for ear pain, postnasal drip and sore throat.   Respiratory: Positive for cough. Negative for hemoptysis, shortness of breath and wheezing.   Allergic/Immunologic: Positive for environmental allergies.  Neurological: Negative for headaches.       Objective:   Physical Exam  Constitutional: She is oriented to person, place, and time. Vital signs are normal. She appears well-developed and well-nourished. She is cooperative.  Non-toxic appearance. She does not appear ill. No distress.  Overweight elderly female in NAD  HENT:  Head: Normocephalic.  Right Ear: Hearing, tympanic membrane, external ear and ear canal normal. Tympanic membrane is not erythematous, not retracted and not bulging.  Left Ear: Hearing, tympanic membrane, external ear and ear canal normal. Tympanic membrane is not erythematous, not retracted and not bulging.  Nose: No mucosal edema or rhinorrhea. Right sinus exhibits no maxillary  sinus tenderness and no frontal sinus tenderness. Left sinus exhibits no maxillary sinus tenderness and no frontal sinus tenderness.  Mouth/Throat: Uvula is midline, oropharynx is clear and moist and mucous membranes are normal.  Eyes: Conjunctivae, EOM and lids are normal. Pupils are equal, round, and reactive to light. Lids are everted and swept, no foreign bodies found.  Neck: Trachea normal and normal range of motion. Neck supple. Carotid bruit is not present. No mass and no thyromegaly present.  Cardiovascular: Normal rate, regular rhythm, S1 normal, S2 normal, normal heart sounds, intact distal pulses and normal pulses.  Exam reveals no gallop and no friction rub.   No murmur heard. Pulmonary/Chest: Effort normal and breath sounds normal. Not tachypneic. No respiratory distress. She has no decreased breath sounds. She has no wheezes. She has no rhonchi. She has no rales.  Abdominal: Soft. Normal appearance and bowel sounds are normal. There is no tenderness.  Genitourinary:  Left labia lima bean size mobile cyst/ sebaceous accumulation.. No erythema no pain, no discharge. Right groin wound covered.  Neurological: She is alert and oriented to person, place, and time.  Skin: Skin is warm, dry and intact. No rash noted.  Psychiatric: Her speech is normal and behavior is normal. Judgment and thought content normal. Her mood appears not anxious. Cognition and memory are normal. She does not exhibit a depressed mood.          Assessment & Plan:

## 2013-02-10 ENCOUNTER — Other Ambulatory Visit: Payer: Self-pay | Admitting: *Deleted

## 2013-02-10 MED ORDER — FLUTICASONE PROPIONATE 50 MCG/ACT NA SUSP: 2.0000 | Freq: Every day | NASAL | Status: DC

## 2013-02-10 MED ORDER — WARFARIN SODIUM 5 MG PO TABS: 5.0000 mg | ORAL_TABLET | ORAL | Status: DC

## 2013-02-10 MED ORDER — BENZONATATE 200 MG PO CAPS: 200.0000 mg | ORAL_CAPSULE | Freq: Two times a day (BID) | ORAL | Status: DC | PRN

## 2013-02-14 ENCOUNTER — Telehealth: Payer: Self-pay | Admitting: Family Medicine

## 2013-02-14 NOTE — Telephone Encounter (Signed)
[facility] pharmacy calling regarding RX for Tessalon pearls sent on 10/17.  Original RX was for higher strength #90.  Pt could not afford this so pharmacy contacted our office and 100mg  tablets were sent in but only qty #18.  They would like to know if pt can have more since original RX was for qty#90 and pt needs more than a 5 day dose.

## 2013-02-15 NOTE — Telephone Encounter (Signed)
Send in 100 mg dose #30, 1 RF

## 2013-02-15 NOTE — Telephone Encounter (Signed)
I don't see where we changed the prescription to Tessalon 100 mg #18. Please advise what you would like me to send it to pharmacy.

## 2013-02-16 MED ORDER — BENZONATATE 100 MG PO CAPS: 100.0000 mg | ORAL_CAPSULE | Freq: Two times a day (BID) | ORAL | Status: DC | PRN

## 2013-03-06 ENCOUNTER — Ambulatory Visit: Payer: Medicare PPO

## 2013-03-16 ENCOUNTER — Ambulatory Visit (INDEPENDENT_AMBULATORY_CARE_PROVIDER_SITE_OTHER): Payer: Medicare PPO | Admitting: Family Medicine

## 2013-03-16 DIAGNOSIS — I4891 Unspecified atrial fibrillation: Secondary | ICD-10-CM

## 2013-03-16 DIAGNOSIS — Z452 Encounter for adjustment and management of vascular access device: Secondary | ICD-10-CM

## 2013-03-16 DIAGNOSIS — Z7902 Long term (current) use of antithrombotics/antiplatelets: Secondary | ICD-10-CM

## 2013-03-16 LAB — POCT INR: INR: 2.4

## 2013-03-21 ENCOUNTER — Ambulatory Visit (INDEPENDENT_AMBULATORY_CARE_PROVIDER_SITE_OTHER): Payer: Medicare PPO | Admitting: Cardiovascular Disease

## 2013-03-21 ENCOUNTER — Encounter: Payer: Self-pay | Admitting: Cardiovascular Disease

## 2013-03-21 VITALS — BP 144/90 | HR 72 | Ht 64.0 in | Wt 185.5 lb

## 2013-03-21 DIAGNOSIS — E118 Type 2 diabetes mellitus with unspecified complications: Secondary | ICD-10-CM

## 2013-03-21 DIAGNOSIS — E785 Hyperlipidemia, unspecified: Secondary | ICD-10-CM

## 2013-03-21 DIAGNOSIS — I5032 Chronic diastolic (congestive) heart failure: Secondary | ICD-10-CM | POA: Insufficient documentation

## 2013-03-21 DIAGNOSIS — I509 Heart failure, unspecified: Secondary | ICD-10-CM

## 2013-03-21 DIAGNOSIS — I10 Essential (primary) hypertension: Secondary | ICD-10-CM

## 2013-03-21 DIAGNOSIS — I4891 Unspecified atrial fibrillation: Secondary | ICD-10-CM

## 2013-03-21 NOTE — Progress Notes (Signed)
Patient ID: Angela Ali, female    DOB: [birth year], 79 y.o.   MRN: [id number]  HPI Comments: [name] is a very pleasant 79 year old woman with a history of chronic atrial fibrillation on warfarin, hypertension, chronic lower extremity edema, shortness of breath, who presents for routine followup. History of fall with hematoma of the right lower extremity,  required care from the wound clinic for a large ulcerative wound on the right lateral aspect of her leg. This has healed well. History of medication confusion, some noncompliance.  On a previous clinic visit, we tried to arrange a visiting nurse to help with medications. She reports that because she did not do physical therapy with [facility], their services were not available. She does physical therapy with [facility].  Overall she reports that she is doing well. No complaints. Occasional vertigo starting yesterday if she moves her head around from side to side or looks down. Little bit of symptoms in the office today when leaning back on the exam table.  She denies any significant palpitations.  No episodes of chest pain, lightheadedness or dizziness. Does not use a cane or walker though on his both  Denies any recent falls.  She wears compression hose bilaterally.   Calcium channel blocker/diltiazem was held in the past secondary to edema.  She takes her Lasix periodically.   EKG shows atrial fibrillation with ventricular rate of 72 beats per minute, no significant ST or T wave changes, poor R-wave progression to the anterior precordial leads    Outpatient Encounter Prescriptions as of 03/21/2013  Medication Sig  . ACCU-CHEK FASTCLIX LANCETS MISC 1 strip by Does not apply route 2 (two) times daily as needed. dx 250.90  . acetaminophen (TYLENOL ARTHRITIS PAIN) 650 MG CR tablet Take 650 mg by mouth daily.  . Ascorbic Acid (VITAMIN C PO) Take 1 tablet by mouth daily.  Marland Kitchen atorvastatin (LIPITOR) 80 MG tablet Take 1 tablet (80 mg total)  by mouth daily.  . benzonatate (TESSALON) 100 MG capsule Take 1 capsule (100 mg total) by mouth 2 (two) times daily as needed for cough.  . Blood Glucose Monitoring Suppl (ACCU-CHEK NANO SMARTVIEW) W/DEVICE KIT 1 Device by Does not apply route 2 (two) times daily. Use device to check blood sugar dx 250.90  . Cholecalciferol (VITAMIN D-3 PO) Take 1 tablet by mouth daily.  . cloNIDine (CATAPRES) 0.1 MG tablet Take 0.05 mg by mouth 2 (two) times daily.   . digoxin (LANOXIN) 0.125 MG tablet Take 1 tablet (0.125 mg total) by mouth daily.  Marland Kitchen diltiazem (CARDIZEM) 30 MG tablet Take 1 tablet (30 mg total) by mouth 4 (four) times daily as needed. For fast heart beat  . fluticasone (FLONASE) 50 MCG/ACT nasal spray Place 2 sprays into the nose daily.  . furosemide (LASIX) 80 MG tablet Take 1 tablet (80 mg total) by mouth daily as needed. For excessive fluid  . glucose blood test strip Use as instructed to check blood sugar 2 (two) times daily dx 250.90  . hydrALAZINE (APRESOLINE) 50 MG tablet Take 1 tablet (50 mg total) by mouth 3 (three) times daily.  . hydrochlorothiazide (HYDRODIURIL) 25 MG tablet Take 1 tablet (25 mg total) by mouth daily.  Marland Kitchen losartan (COZAAR) 100 MG tablet Take 1 tablet (100 mg total) by mouth daily.  . metoprolol (TOPROL-XL) 100 MG 24 hr tablet Take 1 tablet (100 mg total) by mouth daily.  Marland Kitchen omeprazole (PRILOSEC) 20 MG capsule Take 1 capsule (20 mg  total) by mouth daily.  . potassium chloride SA (K-DUR,KLOR-CON) 20 MEQ tablet Take 20 mEq by mouth daily as needed.  . warfarin (COUMADIN) 2.5 MG tablet Take 2.5 mg by mouth See admin instructions. Take 2.5 mg on Tuesday, Thursday, and Saturdays (alternating with 5mg  on other days)  . warfarin (COUMADIN) 5 MG tablet Take 1 tablet (5 mg total) by mouth See admin instructions. Take 5 mg on Mon, Wed, Fri, Sunday (2.5 mg on other days)  . [DISCONTINUED] ACCU-CHEK FASTCLIX LANCETS MISC 1 strip by Does not apply route 2 (two) times daily. dx 250.90      Review of Systems  Constitutional: Negative.   HENT: Negative.   Eyes: Negative.   Respiratory: Negative.   Cardiovascular: Negative.   Gastrointestinal: Negative.   Endocrine: Negative.   Musculoskeletal: Positive for back pain and gait problem.  Skin: Negative.   Allergic/Immunologic: Negative.   Neurological: Negative.   Hematological: Negative.   Psychiatric/Behavioral: Negative.   All other systems reviewed and are negative.   BP 144/90  Pulse 72  Ht 5\' 4"  (1.626 m)  Wt 185 lb 8 oz (84.142 kg)  BMI 31.83 kg/m2  Physical Exam  Nursing note and vitals reviewed. Constitutional: She is oriented to person, place, and time. She appears well-developed and well-nourished.  Obese   HENT:  Head: Normocephalic.  Nose: Nose normal.  Mouth/Throat: Oropharynx is clear and moist.  Eyes: Conjunctivae are normal. Pupils are equal, round, and reactive to light.  Neck: Normal range of motion. Neck supple. No JVD present.  Cardiovascular: Normal rate, regular rhythm, S1 normal, S2 normal, normal heart sounds and intact distal pulses.  Exam reveals no gallop and no friction rub.   No murmur heard. Pulmonary/Chest: Effort normal and breath sounds normal. No respiratory distress. She has no wheezes. She has no rales. She exhibits no tenderness.  Abdominal: Soft. Bowel sounds are normal. She exhibits no distension. There is no tenderness.  Musculoskeletal: Normal range of motion. She exhibits no edema and no tenderness.  Lymphadenopathy:    She has no cervical adenopathy.  Neurological: She is alert and oriented to person, place, and time. Coordination normal.  Skin: Skin is warm and dry. No rash noted. No erythema.  Psychiatric: She has a normal mood and affect. Her behavior is normal. Judgment and thought content normal.    Assessment and Plan

## 2013-03-21 NOTE — Assessment & Plan Note (Signed)
Heart rate well controlled, on anticoagulation 

## 2013-03-21 NOTE — Assessment & Plan Note (Signed)
Recommended she stay on her Lasix with potassium. Appears to be relatively euvolemic

## 2013-03-21 NOTE — Patient Instructions (Addendum)
You are doing well. No medication changes were made.  If your blood pressure runs really high, ok to take extra hydralazine  Please call us if you have new issues that need to be addressed before your next appt.  Your physician wants you to follow-up in: 6 months.  You will receive a reminder letter in the mail two months in advance. If you don't receive a letter, please call our office to schedule the follow-up appointment.

## 2013-03-21 NOTE — Assessment & Plan Note (Signed)
Blood pressure is well controlled on today's visit. No changes made to the medications. 

## 2013-03-21 NOTE — Assessment & Plan Note (Signed)
Weight has significantly improved over the past year. Encouraged her to continue to watch her diet

## 2013-03-21 NOTE — Assessment & Plan Note (Signed)
Cholesterol is at goal on the current lipid regimen. No changes to the medications were made.  

## 2013-04-05 ENCOUNTER — Telehealth: Payer: Self-pay

## 2013-04-05 NOTE — Telephone Encounter (Signed)
Indication for chronic tendinopathy or potentially partial thickness tendon tears, but not generalized pain or oa

## 2013-04-05 NOTE — Telephone Encounter (Signed)
I agree with Dr. Patsy Lager. NO this med does not help with pain from arthriris etc.

## 2013-04-05 NOTE — Telephone Encounter (Signed)
Pt's daughter wants to know if can use nitroglycerin  Transdermal 0.2mg /hr strip can be put on skin for pain. Med was given to pt by her sister and wants to know if can be used by pt for left ankle pain. Pt has had same ankle pain for 2 years; worse when weight bearing. [facility] [address] [place], pts daughter request cb.

## 2013-04-06 NOTE — Telephone Encounter (Signed)
Angela Ali notified that the Nitroglycerin Strip is not used for generalized pain or osteoarthritis.  Offered Angela Ali and appointment if she would like to have her foot pain evaluated.  She declines at this time but will call us back if needed.

## 2013-04-25 ENCOUNTER — Ambulatory Visit (INDEPENDENT_AMBULATORY_CARE_PROVIDER_SITE_OTHER): Payer: Medicare PPO | Admitting: Family Medicine

## 2013-04-25 ENCOUNTER — Ambulatory Visit: Payer: Medicare PPO

## 2013-04-25 DIAGNOSIS — Z452 Encounter for adjustment and management of vascular access device: Secondary | ICD-10-CM

## 2013-04-25 DIAGNOSIS — Z7902 Long term (current) use of antithrombotics/antiplatelets: Secondary | ICD-10-CM

## 2013-04-25 DIAGNOSIS — I4891 Unspecified atrial fibrillation: Secondary | ICD-10-CM

## 2013-04-25 LAB — POCT INR: INR: 2.4

## 2013-05-05 ENCOUNTER — Encounter: Payer: Self-pay | Admitting: Gastroenterology

## 2013-05-05 ENCOUNTER — Ambulatory Visit: Payer: Medicare PPO | Admitting: Family Medicine

## 2013-06-05 ENCOUNTER — Other Ambulatory Visit: Payer: Self-pay | Admitting: Family Medicine

## 2013-06-05 ENCOUNTER — Ambulatory Visit (INDEPENDENT_AMBULATORY_CARE_PROVIDER_SITE_OTHER): Payer: Commercial Managed Care - HMO | Admitting: Family Medicine

## 2013-06-05 DIAGNOSIS — Z7902 Long term (current) use of antithrombotics/antiplatelets: Secondary | ICD-10-CM

## 2013-06-05 DIAGNOSIS — Z452 Encounter for adjustment and management of vascular access device: Secondary | ICD-10-CM

## 2013-06-05 DIAGNOSIS — Z5181 Encounter for therapeutic drug level monitoring: Secondary | ICD-10-CM

## 2013-06-05 DIAGNOSIS — I4891 Unspecified atrial fibrillation: Secondary | ICD-10-CM

## 2013-06-05 MED ORDER — METOPROLOL SUCCINATE ER 100 MG PO TB24: 100.0000 mg | ORAL_TABLET | Freq: Every day | ORAL | Status: DC

## 2013-06-06 ENCOUNTER — Ambulatory Visit (INDEPENDENT_AMBULATORY_CARE_PROVIDER_SITE_OTHER): Payer: Medicare PPO | Admitting: Gastroenterology

## 2013-06-06 ENCOUNTER — Encounter: Payer: Self-pay | Admitting: Gastroenterology

## 2013-06-06 ENCOUNTER — Ambulatory Visit: Payer: Medicare PPO | Admitting: Gastroenterology

## 2013-06-06 VITALS — BP 150/100 | HR 88 | Ht 63.5 in | Wt 187.4 lb

## 2013-06-06 DIAGNOSIS — K222 Esophageal obstruction: Secondary | ICD-10-CM

## 2013-06-06 DIAGNOSIS — D139 Benign neoplasm of ill-defined sites within the digestive system: Secondary | ICD-10-CM

## 2013-06-06 NOTE — Assessment & Plan Note (Signed)
Patient will be scheduled for followup colonoscopy.  I will check with her cardiologist whether Coumadin can be held prior to the procedure.  The risk of holding anticoagulation therapy or antiplatelet medications was discussed including the increased risk for thromboembolic disease that may include DVT, pulmonary emboli and stroke. The patient understands this risk and is willing to proceed with temporally holding the medication provided that this is approved by her PCP or cardiologist.

## 2013-06-06 NOTE — Progress Notes (Signed)
_                                                                                                                History of Present Illness: 80 year old Afro-American female with history of atrial fibrillation, on Coumadin, esophageal stricture, colon polyps, referred for followup colonoscopy.  In 2009 a 2 cm villous adenoma was removed from the cecum.  She also underwent esophageal dilation of a distal stricture at the same time.  She has no GI complaints including abdominal pain, change in bowel habits, pyrosis or dysphagia.    Past Medical History  Diagnosis Date  . Overweight   . Secondary cardiomyopathy, unspecified   . Atrial flutter   . Congestive heart failure, unspecified   . Edema   . Other and unspecified hyperlipidemia   . Unspecified essential hypertension   . Allergic rhinitis, cause unspecified   . Colonic polyp   . Diverticulosis of colon (without mention of hemorrhage)   . Other malaise and fatigue   . Stricture and stenosis of esophagus   . Bacterial pneumonia, unspecified   . Other abnormal glucose   . Esophageal reflux   . Osteoarthrosis, unspecified whether generalized or localized, unspecified site   . Displacement of lumbar intervertebral disc without myelopathy   . Diaphragmatic hernia without mention of obstruction or gangrene   . Long term (current) use of anticoagulants   . Atrial fibrillation   . Prediabetes   . Splenic mass 08/21/2011  . Thigh abscess    Past Surgical History  Procedure Laterality Date  . Esophageal dilation  01/2007  . Abdominal hysterectomy  1971    partial  . Ankle surgery Left 2001  . Cholecystectomy  2004   family history includes Bone cancer in her son; Coronary artery disease (age of onset: 59) in her mother; Diabetes in her daughter and daughter; Emphysema (age of onset: 41) in her father; Hyperlipidemia in her sister and sister; Hypertension in her daughter and daughter; Hypertension (age of onset: 44)  in her mother; Ovarian cancer in her daughter. Current Outpatient Prescriptions  Medication Sig Dispense Refill  . ACCU-CHEK FASTCLIX LANCETS MISC 1 strip by Does not apply route 2 (two) times daily as needed. dx 250.90      . acetaminophen (TYLENOL ARTHRITIS PAIN) 650 MG CR tablet Take 650 mg by mouth daily.      . Ascorbic Acid (VITAMIN C PO) Take 1 tablet by mouth daily.      Marland Kitchen atorvastatin (LIPITOR) 80 MG tablet Take 1 tablet (80 mg total) by mouth daily.  90 tablet  3  . benzonatate (TESSALON) 100 MG capsule Take 1 capsule (100 mg total) by mouth 2 (two) times daily as needed for cough.  30 capsule  1  . Blood Glucose Monitoring Suppl (ACCU-CHEK NANO SMARTVIEW) W/DEVICE KIT 1 Device by Does not apply route 2 (two) times daily. Use device to check blood sugar dx 250.90  1 kit  0  . Cholecalciferol (VITAMIN D-3 PO) Take 1 tablet  by mouth daily.      . digoxin (LANOXIN) 0.125 MG tablet Take 1 tablet (0.125 mg total) by mouth daily.  90 tablet  3  . diltiazem (CARDIZEM) 30 MG tablet Take 1 tablet (30 mg total) by mouth 4 (four) times daily as needed. For fast heart beat  120 tablet  0  . fluticasone (FLONASE) 50 MCG/ACT nasal spray Place 2 sprays into the nose daily.  48 g  1  . furosemide (LASIX) 80 MG tablet Take 1 tablet (80 mg total) by mouth daily as needed. For excessive fluid  3 tablet  0  . glucose blood test strip Use as instructed to check blood sugar 2 (two) times daily dx 250.90  200 each  2  . hydrALAZINE (APRESOLINE) 50 MG tablet Take 1 tablet (50 mg total) by mouth 3 (three) times daily.  270 tablet  3  . hydrochlorothiazide (HYDRODIURIL) 25 MG tablet Take 1 tablet (25 mg total) by mouth daily.  90 tablet  3  . losartan (COZAAR) 100 MG tablet Take 1 tablet (100 mg total) by mouth daily.  90 tablet  3  . metoprolol succinate (TOPROL-XL) 100 MG 24 hr tablet Take 1 tablet (100 mg total) by mouth daily.  90 tablet  1  . omeprazole (PRILOSEC) 20 MG capsule Take 1 capsule (20 mg total)  by mouth daily.  90 capsule  3  . potassium chloride SA (K-DUR,KLOR-CON) 20 MEQ tablet Take 20 mEq by mouth daily as needed.      . warfarin (COUMADIN) 2.5 MG tablet Take 2.5 mg by mouth See admin instructions. Take 2.5 mg on Tuesday, Thursday, and Saturdays (alternating with $RemoveBeforeDEI'5mg'[id number]$  on other days)      . warfarin (COUMADIN) 5 MG tablet Take 1 tablet (5 mg total) by mouth See admin instructions. Take 5 mg on Mon, Wed, Fri, Sunday (2.5 mg on other days)  90 tablet  0  . [DISCONTINUED] esomeprazole (NEXIUM) 40 MG capsule Take 40 mg by mouth daily.         No current facility-administered medications for this visit.   Allergies as of 06/06/2013 - Review Complete 06/06/2013  Allergen Reaction Noted  . Morphine and related  02/04/2012  . Percocet [oxycodone-acetaminophen] Nausea Only 02/27/2011  . Tramadol Nausea Only 08/13/2011    reports that she has never smoked. She has never used smokeless tobacco. She reports that she does not drink alcohol or use illicit drugs.     Review of Systems: She has a persistent ankle tenderness following a fall 2 years ago Pertinent positive and negative review of systems were noted in the above HPI section. All other review of systems were otherwise negative.  Vital signs were reviewed in today's medical record Physical Exam: General: Well developed , well nourished, no acute distress Skin: anicteric Head: Normocephalic and atraumatic Eyes:  sclerae anicteric, EOMI Ears: Normal auditory acuity Mouth: No deformity or lesions Neck: Supple, no masses or thyromegaly Lungs: Clear throughout to auscultation Heart: Regular rate and rhythm; no rubs or bruits Abdomen: Soft, non tender and non distended. No masses, hepatosplenomegaly or hernias noted. Normal Bowel sounds.  There is a 1/6 early systolic murmur Rectal:deferred Musculoskeletal: Symmetrical with no gross deformities  Skin: No lesions on visible extremities Pulses:  Normal pulses noted Extremities: No  clubbing, cyanosis, edema or deformities noted Neurological: Alert oriented x 4, grossly nonfocal Cervical Nodes:  No significant cervical adenopathy Inguinal Nodes: No significant inguinal adenopathy Psychological:  Alert and cooperative. Normal mood  and affect  See Assessment and Plan under Problem List

## 2013-06-06 NOTE — Assessment & Plan Note (Signed)
Asymptomatic. 

## 2013-06-06 NOTE — Patient Instructions (Signed)
It has been recommended to you by your physician that you have a(n) Colonoscopy at [facility] completed. Per your request, we did not schedule the procedure(s) today. Please contact our office at [phone] should you decide to have the procedure completed.  You will need to be off coumadin and this will have to be ok'd by your cardiology

## 2013-06-26 ENCOUNTER — Other Ambulatory Visit: Payer: Self-pay | Admitting: *Deleted

## 2013-06-26 MED ORDER — WARFARIN SODIUM 5 MG PO TABS: 5.0000 mg | ORAL_TABLET | ORAL | Status: DC

## 2013-06-28 ENCOUNTER — Telehealth: Payer: Self-pay

## 2013-06-28 NOTE — Telephone Encounter (Signed)
Advised pt to contact her GI doctor and have them send over cardiac clearance request.

## 2013-06-28 NOTE — Telephone Encounter (Signed)
Needs surgical clearance for colonoscopy

## 2013-06-29 ENCOUNTER — Telehealth: Payer: Self-pay | Admitting: Gastroenterology

## 2013-06-30 NOTE — Telephone Encounter (Signed)
Pt would like to have colon scheduled. States her cardiologist requests a letter from our office so that he may provide [place] with instructions regarding her coumadin. Letter sent and awaiting instruction. Will call pt to schedule once we have coumadin instruction. Pt aware.

## 2013-07-07 ENCOUNTER — Encounter: Payer: Self-pay | Admitting: Family Medicine

## 2013-07-07 ENCOUNTER — Telehealth: Payer: Self-pay

## 2013-07-07 ENCOUNTER — Ambulatory Visit (INDEPENDENT_AMBULATORY_CARE_PROVIDER_SITE_OTHER): Payer: Medicare PPO | Admitting: Family Medicine

## 2013-07-07 VITALS — BP 130/80 | HR 67 | Temp 98.2°F | Ht 63.5 in | Wt 188.5 lb

## 2013-07-07 DIAGNOSIS — J069 Acute upper respiratory infection, unspecified: Secondary | ICD-10-CM

## 2013-07-07 DIAGNOSIS — I4891 Unspecified atrial fibrillation: Secondary | ICD-10-CM

## 2013-07-07 MED ORDER — BENZONATATE 100 MG PO CAPS: 100.0000 mg | ORAL_CAPSULE | Freq: Two times a day (BID) | ORAL | Status: DC | PRN

## 2013-07-07 NOTE — Progress Notes (Signed)
   Subjective:    Patient ID: Angela Ali, female    DOB: [birth year], 80 y.o.   MRN: [id number]  Sinusitis This is a new problem. The current episode started in the past 7 days (started after visiting sister in hospital). The problem has been gradually improving since onset. There has been no fever. The pain is moderate. Associated symptoms include congestion, coughing, headaches and sinus pressure. Pertinent negatives include no chills, neck pain, shortness of breath or sore throat. (Post tussive emesis One episode of pain in left chest with cough Moist cough) Treatments tried: honey tea, benzonatate. The treatment provided mild relief.  Cough The cough is non-productive. Associated symptoms include headaches and postnasal drip. Pertinent negatives include no chills, myalgias, rash, sore throat or shortness of breath. Associated symptoms comments: fatigued. There is no history of asthma, bronchiectasis, bronchitis, COPD, emphysema, environmental allergies or pneumonia. Afib on coumadin    Needs referral for cardiologist for [facility].  Review of Systems  Constitutional: Negative for chills.  HENT: Positive for congestion, postnasal drip and sinus pressure. Negative for sore throat.   Respiratory: Positive for cough. Negative for shortness of breath.   Musculoskeletal: Negative for myalgias and neck pain.  Skin: Negative for rash.  Allergic/Immunologic: Negative for environmental allergies.  Neurological: Positive for headaches.       Objective:   Physical Exam  Constitutional: Vital signs are normal. She appears well-developed and well-nourished. She is cooperative.  Non-toxic appearance. She does not appear ill. No distress.  HENT:  Head: Normocephalic.  Right Ear: Hearing, tympanic membrane, external ear and ear canal normal. Tympanic membrane is not erythematous, not retracted and not bulging.  Left Ear: Hearing, tympanic membrane, external ear and ear canal normal. Tympanic membrane  is not erythematous, not retracted and not bulging.  Nose: Mucosal edema and rhinorrhea present. Right sinus exhibits no maxillary sinus tenderness and no frontal sinus tenderness. Left sinus exhibits no maxillary sinus tenderness and no frontal sinus tenderness.  Mouth/Throat: Uvula is midline, oropharynx is clear and moist and mucous membranes are normal.  Eyes: Conjunctivae, EOM and lids are normal. Pupils are equal, round, and reactive to light. Lids are everted and swept, no foreign bodies found.  Neck: Trachea normal and normal range of motion. Neck supple. Carotid bruit is not present. No mass and no thyromegaly present.  Cardiovascular: Normal rate, regular rhythm, S1 normal, S2 normal, normal heart sounds, intact distal pulses and normal pulses.  Exam reveals no gallop and no friction rub.   No murmur heard. Pulmonary/Chest: Effort normal and breath sounds normal. Not tachypneic. No respiratory distress. She has no decreased breath sounds. She has no wheezes. She has no rhonchi. She has no rales.  Neurological: She is alert.  Skin: Skin is warm, dry and intact. No rash noted.  Psychiatric: Her speech is normal and behavior is normal. Judgment normal. Her mood appears not anxious. Cognition and memory are normal. She does not exhibit a depressed mood.          Assessment & Plan:

## 2013-07-07 NOTE — Addendum Note (Signed)
Addended by: Eliezer Lofts E on: 07/07/2013 10:24 AM   Modules accepted: Orders

## 2013-07-07 NOTE — Patient Instructions (Signed)
Salt water nasal irrigation or spray. Continue benzonatate perles.  Start plain mucinex  (No decongestant). Expect 5-7 more days of illness but you should be continuing to improve. Call sooner if worsening or if any shortness of breath. Push fluids some.

## 2013-07-07 NOTE — Progress Notes (Signed)
Pre visit review using our clinic review tool, if applicable. No additional management support is needed unless otherwise documented below in the visit note. 

## 2013-07-07 NOTE — Telephone Encounter (Signed)
Would like to know status of clearance for colonoscopy. please call.

## 2013-07-11 NOTE — Telephone Encounter (Signed)
Okay to proceed with colonoscopy Certainly a risk coming off warfarin but a procedure needs to be done, Would hold the warfarin 5 days prior to the procedure and restart warfarin the night after procedure is done

## 2013-07-12 NOTE — Telephone Encounter (Signed)
Sent letter to Dr. Kelby Fam office.

## 2013-07-12 NOTE — Telephone Encounter (Signed)
Angela Merritts, MD at 07/11/2013 5:03 PM    Status: Signed        Okay to proceed with colonoscopy  Certainly a risk coming off warfarin but a procedure needs to be done,  Would hold the warfarin 5 days prior to the procedure and restart warfarin the night after procedure is done

## 2013-07-12 NOTE — Telephone Encounter (Signed)
Called pt to schedule colon. Pt states she needs to talk to her daughter to find out when she can bring her. Pt will call back to schedule colonoscopy.

## 2013-07-17 ENCOUNTER — Ambulatory Visit (INDEPENDENT_AMBULATORY_CARE_PROVIDER_SITE_OTHER): Payer: Commercial Managed Care - HMO | Admitting: Family Medicine

## 2013-07-17 DIAGNOSIS — Z452 Encounter for adjustment and management of vascular access device: Secondary | ICD-10-CM

## 2013-07-17 DIAGNOSIS — Z7902 Long term (current) use of antithrombotics/antiplatelets: Secondary | ICD-10-CM

## 2013-07-17 DIAGNOSIS — I4891 Unspecified atrial fibrillation: Secondary | ICD-10-CM

## 2013-07-17 DIAGNOSIS — Z5181 Encounter for therapeutic drug level monitoring: Secondary | ICD-10-CM

## 2013-07-17 LAB — POCT INR: INR: 3.1

## 2013-07-18 ENCOUNTER — Encounter: Payer: Self-pay | Admitting: Family Medicine

## 2013-07-20 ENCOUNTER — Encounter: Payer: Self-pay | Admitting: Cardiovascular Disease

## 2013-07-20 ENCOUNTER — Ambulatory Visit (INDEPENDENT_AMBULATORY_CARE_PROVIDER_SITE_OTHER): Payer: Commercial Managed Care - HMO | Admitting: Cardiovascular Disease

## 2013-07-20 VITALS — BP 140/85 | HR 61 | Ht 65.0 in | Wt 187.0 lb

## 2013-07-20 DIAGNOSIS — I4891 Unspecified atrial fibrillation: Secondary | ICD-10-CM

## 2013-07-20 DIAGNOSIS — E663 Overweight: Secondary | ICD-10-CM

## 2013-07-20 DIAGNOSIS — R609 Edema, unspecified: Secondary | ICD-10-CM

## 2013-07-20 DIAGNOSIS — I10 Essential (primary) hypertension: Secondary | ICD-10-CM

## 2013-07-20 DIAGNOSIS — I509 Heart failure, unspecified: Secondary | ICD-10-CM

## 2013-07-20 DIAGNOSIS — E785 Hyperlipidemia, unspecified: Secondary | ICD-10-CM

## 2013-07-20 DIAGNOSIS — I5032 Chronic diastolic (congestive) heart failure: Secondary | ICD-10-CM

## 2013-07-20 NOTE — Assessment & Plan Note (Signed)
We have encouraged continued exercise, careful diet management in an effort to lose weight. 

## 2013-07-20 NOTE — Progress Notes (Signed)
Patient ID: Angela Ali, female    DOB: 1928/09/18, 80 y.o.   MRN: [id number]  HPI Comments: Angela Ali is a very pleasant 80 year old woman with a history of chronic atrial fibrillation on warfarin, hypertension, chronic lower extremity edema, shortness of breath, who presents for routine followup. History of fall with hematoma of the right lower extremity,  required care from the wound clinic for a large ulcerative wound on the right lateral aspect of her leg. This has healed well. History of medication confusion, some noncompliance.  On a previous clinic visit, we tried to arrange a visiting nurse to help with medications. She reports that because she did not do physical therapy with [facility], their services were not available. She does physical therapy with [facility].  In followup today, Overall she reports that she is doing well. No complaints. Vertigo seems to have improved. No significant shortness of breath. She continues to battle with her weight. Sugars also sometimes elevated. Hemoglobin A1c 6.4. Blood pressure typically well controlled at home   She denies any significant palpitations.  No episodes of chest pain, lightheadedness or dizziness. Does not use a cane or walker   Denies any recent falls.  She wears compression hose bilaterally.   Calcium channel blocker/diltiazem was held in the past secondary to edema.  She takes her Lasix periodically.   EKG shows atrial fibrillation with ventricular rate of 61 beats per minute, no significant ST or T wave changes, poor R-wave progression to the anterior precordial leads    Outpatient Encounter Prescriptions as of 07/20/2013  Medication Sig  . ACCU-CHEK FASTCLIX LANCETS MISC 1 strip by Does not apply route 2 (two) times daily as needed. dx 250.90  . acetaminophen (TYLENOL ARTHRITIS PAIN) 650 MG CR tablet Take 650 mg by mouth daily.  . Ascorbic Acid (VITAMIN C PO) Take 1 tablet by mouth daily.  Marland Kitchen atorvastatin (LIPITOR) 80 MG  tablet Take 1 tablet (80 mg total) by mouth daily.  . benzonatate (TESSALON) 100 MG capsule Take 1 capsule (100 mg total) by mouth 2 (two) times daily as needed for cough.  . Blood Glucose Monitoring Suppl (ACCU-CHEK NANO SMARTVIEW) W/DEVICE KIT 1 Device by Does not apply route 2 (two) times daily. Use device to check blood sugar dx 250.90  . Cholecalciferol (VITAMIN D-3 PO) Take 1 tablet by mouth daily.  . digoxin (LANOXIN) 0.125 MG tablet Take 1 tablet (0.125 mg total) by mouth daily.  Marland Kitchen diltiazem (CARDIZEM) 30 MG tablet Take 1 tablet (30 mg total) by mouth 4 (four) times daily as needed. For fast heart beat  . fluticasone (FLONASE) 50 MCG/ACT nasal spray Place 2 sprays into the nose daily.  . furosemide (LASIX) 80 MG tablet Take 1 tablet (80 mg total) by mouth daily as needed. For excessive fluid  . glucose blood test strip Use as instructed to check blood sugar 2 (two) times daily dx 250.90  . hydrALAZINE (APRESOLINE) 50 MG tablet Take 1 tablet (50 mg total) by mouth 3 (three) times daily.  . hydrochlorothiazide (HYDRODIURIL) 25 MG tablet Take 1 tablet (25 mg total) by mouth daily.  Marland Kitchen losartan (COZAAR) 100 MG tablet Take 1 tablet (100 mg total) by mouth daily.  . metoprolol succinate (TOPROL-XL) 100 MG 24 hr tablet Take 1 tablet (100 mg total) by mouth daily.  Marland Kitchen omeprazole (PRILOSEC) 20 MG capsule Take 1 capsule (20 mg total) by mouth daily.  . potassium chloride SA (K-DUR,KLOR-CON) 20 MEQ tablet Take 20 mEq  by mouth daily as needed.  . warfarin (COUMADIN) 2.5 MG tablet Take 2.5 mg by mouth See admin instructions. Take 2.5 mg on Tuesday, Thursday, and Saturdays (alternating with 58m on other days)  . warfarin (COUMADIN) 5 MG tablet Take 1 tablet (5 mg total) by mouth See admin instructions. Take 5 mg on Mon, Wed, Fri, Sunday (2.5 mg on other days)     Review of Systems  Constitutional: Negative.   HENT: Negative.   Eyes: Negative.   Respiratory: Negative.   Cardiovascular: Negative.    Gastrointestinal: Negative.   Endocrine: Negative.   Musculoskeletal: Positive for back pain and gait problem.  Skin: Negative.   Allergic/Immunologic: Negative.   Neurological: Negative.   Hematological: Negative.   Psychiatric/Behavioral: Negative.   All other systems reviewed and are negative.   BP 150/100  Pulse 61  Ht _0  (1.651 m)  Wt 187 lb (84.823 kg)  BMI 31.12 kg/m2  Physical Exam  Nursing note and vitals reviewed. Constitutional: She is oriented to person, place, and time. She appears well-developed and well-nourished.  Obese   HENT:  Head: Normocephalic.  Nose: Nose normal.  Mouth/Throat: Oropharynx is clear and moist.  Eyes: Conjunctivae are normal. Pupils are equal, round, and reactive to light.  Neck: Normal range of motion. Neck supple. No JVD present.  Cardiovascular: Normal rate, regular rhythm, S1 normal, S2 normal, normal heart sounds and intact distal pulses.  Exam reveals no gallop and no friction rub.   No murmur heard. Pulmonary/Chest: Effort normal and breath sounds normal. No respiratory distress. She has no wheezes. She has no rales. She exhibits no tenderness.  Abdominal: Soft. Bowel sounds are normal. She exhibits no distension. There is no tenderness.  Musculoskeletal: Normal range of motion. She exhibits no edema and no tenderness.  Lymphadenopathy:    She has no cervical adenopathy.  Neurological: She is alert and oriented to person, place, and time. Coordination normal.  Skin: Skin is warm and dry. No rash noted. No erythema.  Psychiatric: She has a normal mood and affect. Her behavior is normal. Judgment and thought content normal.    Assessment and Plan

## 2013-07-20 NOTE — Assessment & Plan Note (Signed)
She takes Lasix as needed. Rate relatively stable. Clinically appears euvolemic

## 2013-07-20 NOTE — Assessment & Plan Note (Signed)
Recommended she wear compression hose as tolerated. Edema likely from venous insufficiency

## 2013-07-20 NOTE — Assessment & Plan Note (Signed)
Blood pressure is well controlled on today's visit. No changes made to the medications. 

## 2013-07-20 NOTE — Patient Instructions (Signed)
You are doing well. No medication changes were made.  Stop warfarin 5 days before the colonoscopy Once the procedure is done, restart the warfarin  Please call us if you have new issues that need to be addressed before your next appt.  Your physician wants you to follow-up in: 6 months.  You will receive a reminder letter in the mail two months in advance. If you don't receive a letter, please call our office to schedule the follow-up appointment.

## 2013-07-20 NOTE — Assessment & Plan Note (Signed)
Cholesterol is at goal on the current lipid regimen. No changes to the medications were made.  

## 2013-07-20 NOTE — Assessment & Plan Note (Signed)
Heart rate relatively well controlled. Tolerating anticoagulation. 

## 2013-07-21 ENCOUNTER — Encounter: Payer: Self-pay | Admitting: Family Medicine

## 2013-08-01 ENCOUNTER — Telehealth: Payer: Self-pay | Admitting: Family Medicine

## 2013-08-01 NOTE — Telephone Encounter (Signed)
Patient Information:  Caller Name: Tiamarie  Phone: [phone]  Patient: Angela Ali, Angela Ali  Gender: Female  DOB: 06/12/1928  Age: 80 Years  PCP: Eliezer Lofts (Family Practice)  Office Follow Up:  Does the office need to follow up with this patient?: No  Instructions For The Office: N/A   Symptoms  Reason For Call & Symptoms: Cold sx for past month and tried taking Mucinex and still has dry cough and tickle in throat and chest. No other sx. Occasional night-time waking d/t cough. Can take deep breath but causes cough. Voice is grovelly. Afebrile. Denies wheezing or shortness of breath.   Reviewed Health History In EMR: Yes  Reviewed Medications In EMR: Yes  Reviewed Allergies In EMR: Yes  Reviewed Surgeries / Procedures: Yes  Date of Onset of Symptoms: 06/30/2013  Treatments Tried: Musinex every 12 hours, Coricidin HBT  Treatments Tried Worked: No  Guideline(s) Used:  Cough  Disposition Per Guideline:   See Within 3 Days in Office  Reason For Disposition Reached:   Cough has been present for > 10 days  Advice Given:  Reassurance  Coughing is the way that our lungs remove irritants and mucus. It helps protect our lungs from getting pneumonia.  You can get a dry hacking cough after a chest cold. Sometimes this type of cough can last 1-3 weeks, and be worse at night.  You can also get a cough after being exposed to irritating substances like smoke, strong perfumes, and dust.  Cough Medicines:  OTC Cough Drops: Cough drops can help a lot, especially for mild coughs. They reduce coughing by soothing your irritated throat and removing that tickle sensation in the back of the throat. Cough drops also have the advantage of portability - you can carry them with you.  Home Remedy - Honey: This old home remedy has been shown to help decrease coughing at night. The adult dosage is 2 teaspoons (10 ml) at bedtime. Honey should not be given to infants under one year of age.  Coughing Spasms:  Drink  warm fluids. Inhale warm mist (Reason: both relax the airway and loosen up the phlegm).  Suck on cough drops or hard candy to coat the irritated throat.  Prevent Dehydration:  Drink adequate liquids.  This will help soothe an irritated or dry throat and loosen up the phlegm.  Expected Course:   The expected course depends on what is causing the cough.  Viral bronchitis (chest cold) causes a cough that lasts 1 to 3 weeks. Sometimes you may cough up lots of phlegm (sputum, mucus). The mucus can normally be white, gray, yellow, or green.  Call Back If:  Difficulty breathing  Cough lasts more than 3 weeks  Fever lasts > 3 days  You become worse.  Patient Will Follow Care Advice:  YES  Appointment Scheduled:  08/02/2013 12:30:00 Appointment Scheduled Provider:  [facility])

## 2013-08-02 ENCOUNTER — Ambulatory Visit: Payer: Self-pay | Admitting: Family Medicine

## 2013-08-04 ENCOUNTER — Encounter: Payer: Self-pay | Admitting: Family Medicine

## 2013-08-04 ENCOUNTER — Ambulatory Visit (INDEPENDENT_AMBULATORY_CARE_PROVIDER_SITE_OTHER): Payer: Commercial Managed Care - HMO | Admitting: Family Medicine

## 2013-08-04 VITALS — BP 116/82 | HR 64 | Temp 98.1°F | Ht 65.0 in | Wt 188.2 lb

## 2013-08-04 DIAGNOSIS — I4891 Unspecified atrial fibrillation: Secondary | ICD-10-CM

## 2013-08-04 DIAGNOSIS — R059 Cough, unspecified: Secondary | ICD-10-CM

## 2013-08-04 DIAGNOSIS — R05 Cough: Secondary | ICD-10-CM | POA: Insufficient documentation

## 2013-08-04 NOTE — Progress Notes (Signed)
   Subjective:    Patient ID: Angela Ali, female    DOB: 08-Feb-1929, 80 y.o.   MRN: [id number]  Cough This is a new problem. The current episode started 1 to 4 weeks ago (ongoing x 1 month). The problem has been gradually worsening. The problem occurs constantly. The cough is non-productive. Associated symptoms include chest pain and postnasal drip. Pertinent negatives include no ear congestion, ear pain, fever, headaches, heartburn, myalgias, nasal congestion, rash, rhinorrhea, sore throat, shortness of breath or wheezing. Associated symptoms comments: Ribs hurt when she coughs  she has also noted left sided chest pain when she is anxious or upset. . The symptoms are aggravated by lying down (most when she is talking). Treatments tried: using mucinex, coricidin. The treatment provided mild relief. Her past medical history is significant for environmental allergies.   Afib, increase in HR seems to be triggered by recent coughing. She takes diltiazem when she feels HR increase.. She has used it several times this week.  Appt with Dr. Rockey Situ 3/26 stable issues, stable EKG   Review of Systems  Constitutional: Negative for fever.  HENT: Positive for postnasal drip. Negative for ear pain, rhinorrhea and sore throat.   Respiratory: Positive for cough. Negative for shortness of breath and wheezing.   Cardiovascular: Positive for chest pain.  Gastrointestinal: Negative for heartburn.  Musculoskeletal: Negative for myalgias.  Skin: Negative for rash.  Allergic/Immunologic: Positive for environmental allergies.  Neurological: Negative for headaches.       Objective:   Physical Exam  Constitutional: Vital signs are normal. She appears well-developed and well-nourished. She is cooperative.  Non-toxic appearance. She does not appear ill. No distress.  HENT:  Head: Normocephalic.  Right Ear: Hearing, tympanic membrane, external ear and ear canal normal. Tympanic membrane is not erythematous, not  retracted and not bulging.  Left Ear: Hearing, tympanic membrane, external ear and ear canal normal. Tympanic membrane is not erythematous, not retracted and not bulging.  Nose: No mucosal edema or rhinorrhea. Right sinus exhibits no maxillary sinus tenderness and no frontal sinus tenderness. Left sinus exhibits no maxillary sinus tenderness and no frontal sinus tenderness.  Mouth/Throat: Uvula is midline, oropharynx is clear and moist and mucous membranes are normal.  Pale turbinates  Eyes: Conjunctivae, EOM and lids are normal. Pupils are equal, round, and reactive to light. Lids are everted and swept, no foreign bodies found.  Neck: Trachea normal and normal range of motion. Neck supple. Carotid bruit is not present. No mass and no thyromegaly present.  Cardiovascular: Normal rate, regular rhythm, S1 normal, S2 normal, normal heart sounds, intact distal pulses and normal pulses.  Exam reveals no gallop and no friction rub.   No murmur heard. Pulmonary/Chest: Effort normal and breath sounds normal. Not tachypneic. No respiratory distress. She has no decreased breath sounds. She has no wheezes. She has no rhonchi. She has no rales.  Frequent dry cough and clearing of throat.  Abdominal: Soft. Normal appearance and bowel sounds are normal. There is no tenderness.  Neurological: She is alert.  Skin: Skin is warm, dry and intact. No rash noted.  Psychiatric: Her speech is normal and behavior is normal. Judgment and thought content normal. Her mood appears not anxious. Cognition and memory are normal. She does not exhibit a depressed mood.          Assessment & Plan:

## 2013-08-04 NOTE — Assessment & Plan Note (Signed)
No clear infeciton. Likely due to allergies vs gerd. Start zyrtec increase prilosec. Follow up prn.

## 2013-08-04 NOTE — Assessment & Plan Note (Signed)
Recent increase in episodes of increase HR... May be secondary to coughing fits and anxiety.  Recent EKG nml.  If continuing return for eval with Dr. Rockey Situ.

## 2013-08-04 NOTE — Patient Instructions (Signed)
Continue flonase 2 spray daily. Start zyrtec at bedtime.  Increase prilosec to 2 tabs daily. Call if  cough not improving in 1 week. If increase episodes of heart rate continuing, follow up with Dr. Rockey Situ.

## 2013-08-04 NOTE — Progress Notes (Signed)
Pre visit review using our clinic review tool, if applicable. No additional management support is needed unless otherwise documented below in the visit note. 

## 2013-08-07 LAB — HM DIABETES EYE EXAM

## 2013-08-17 ENCOUNTER — Other Ambulatory Visit: Payer: Self-pay | Admitting: *Deleted

## 2013-08-17 MED ORDER — ACCU-CHEK FASTCLIX LANCETS MISC: 1.0000 | Freq: Two times a day (BID) | Status: DC | PRN

## 2013-08-28 IMAGING — US US EXTREM LOW*R* LIMITED
1 series · 10 of 10 positions shown · non-contrast
Comparison: None.

CLINICAL DATA: Evaluate right inner thigh swelling and pain.

ULTRASOUND RIGHT LOWER EXTREMITY LIMITED
TECHNIQUE: Ultrasound examination of the right thighwas performed
at the area of concern.

[Series 1: us extrem low*right* limited · 0.08mm/px · 10 acquisitions, 10 frames shown]
[im 1/10]
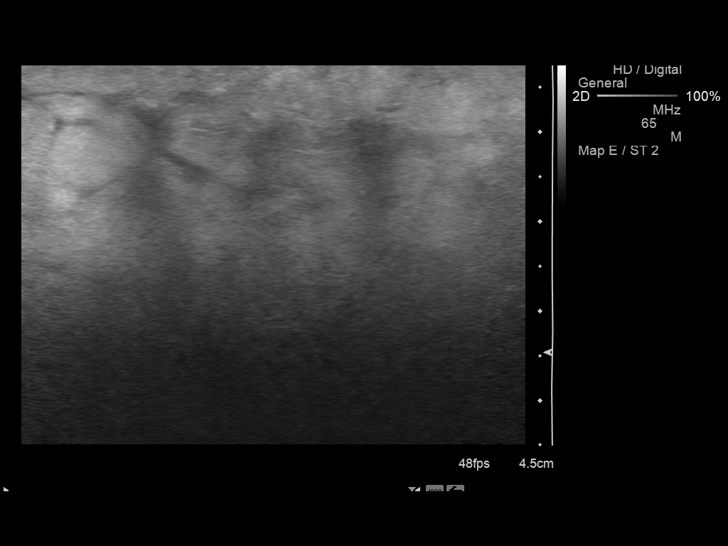
[im 2/10]
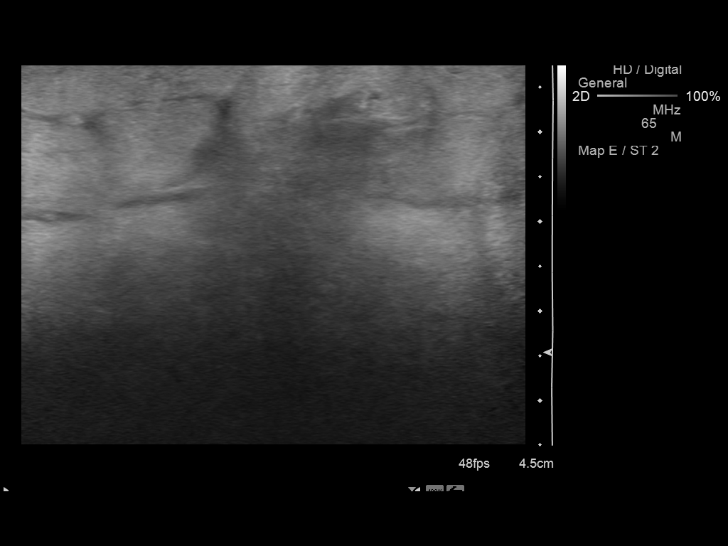
[im 3/10]
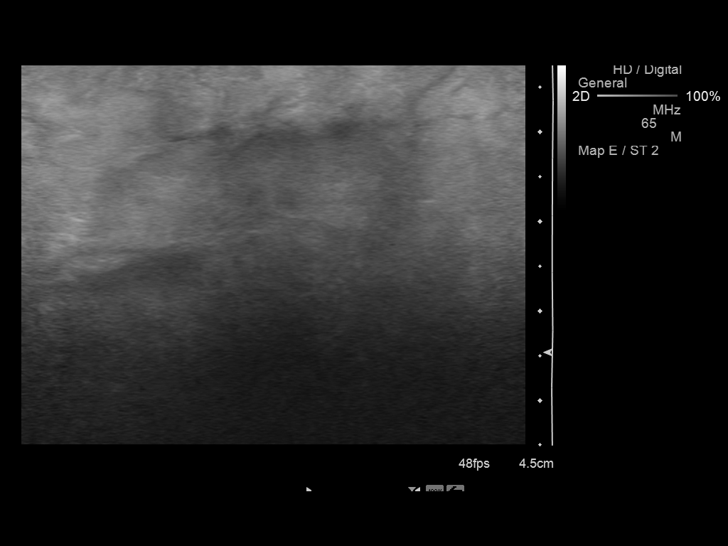
[im 4/10]
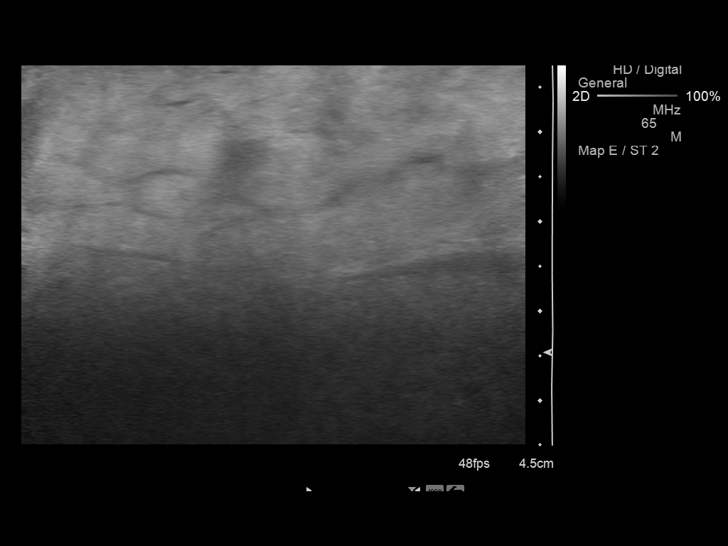
[im 5/10]
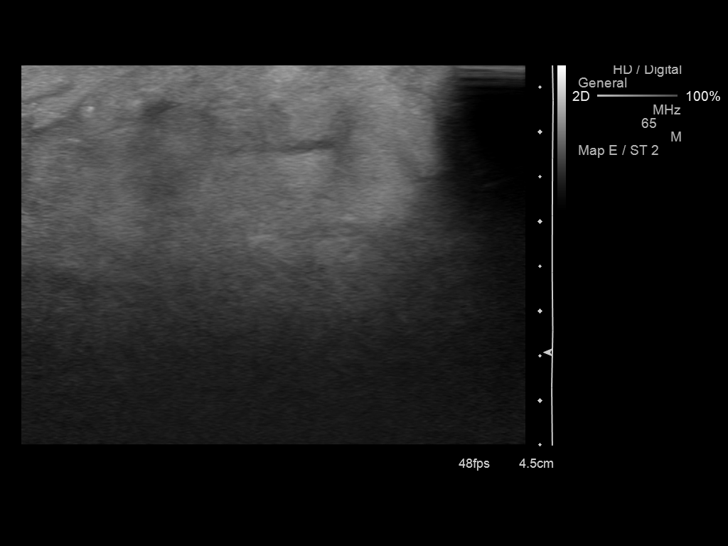
[im 6/10]
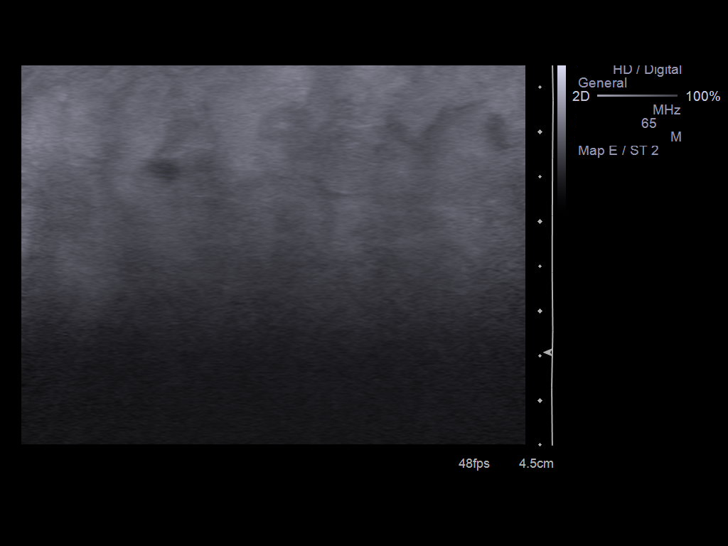
[im 7/10]
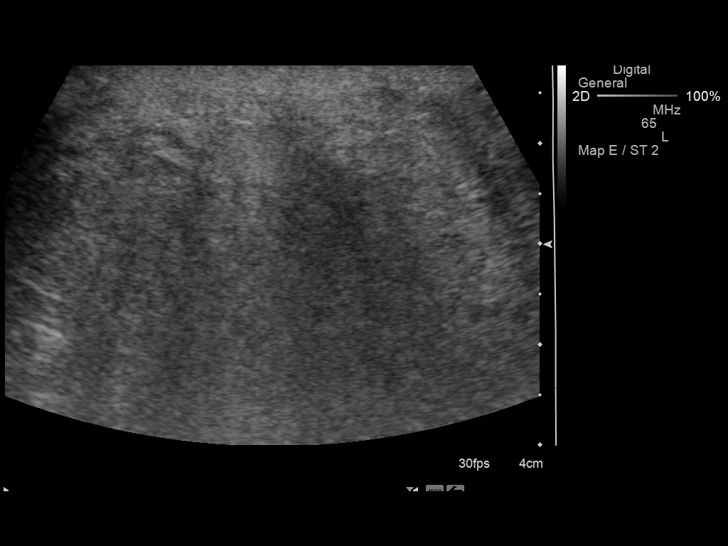
[im 8/10]
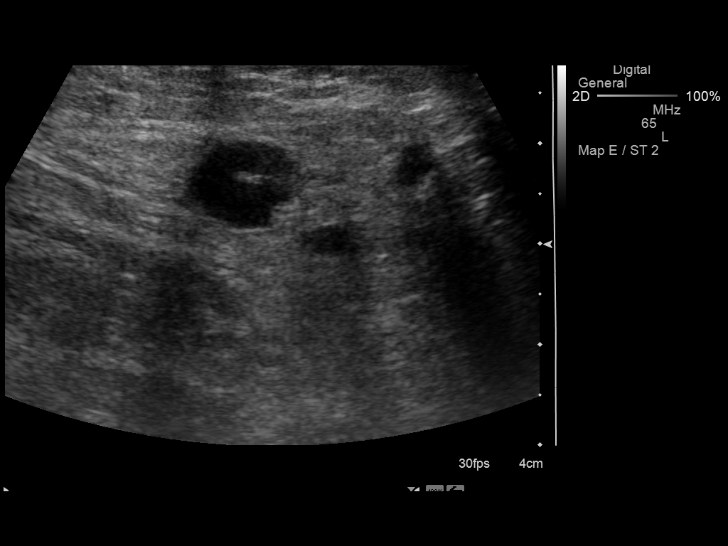
[im 9/10]
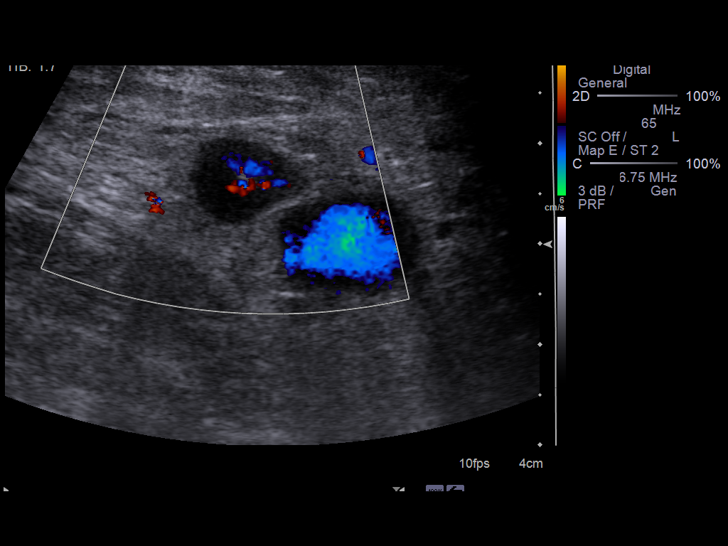
[im 10/10]
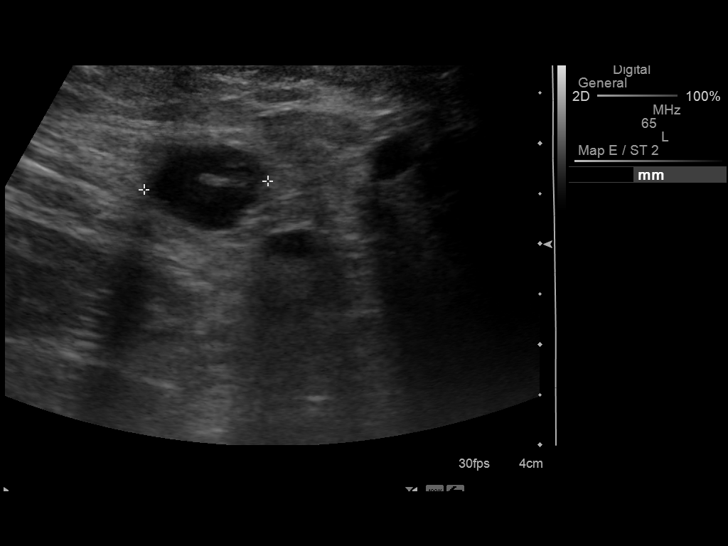

[10 of 10 positions shown; findings below may reference images not displayed]

FINDINGS: There is diffuse subcutaneous edema. There is a small
hypoechoic lymph node in the right groin measuring up to 1.2 cm.
No evidence for a focal fluid collection.
IMPRESSION: Diffuse subcutaneous edema in the right inner thigh.  No evidence
for a fluid collection.

## 2013-08-31 ENCOUNTER — Ambulatory Visit (INDEPENDENT_AMBULATORY_CARE_PROVIDER_SITE_OTHER): Payer: Commercial Managed Care - HMO | Admitting: Family Medicine

## 2013-08-31 ENCOUNTER — Encounter: Payer: Self-pay | Admitting: Family Medicine

## 2013-08-31 VITALS — BP 124/82 | HR 73 | Temp 98.3°F | Ht 63.0 in | Wt 188.0 lb

## 2013-08-31 DIAGNOSIS — E118 Type 2 diabetes mellitus with unspecified complications: Secondary | ICD-10-CM

## 2013-08-31 DIAGNOSIS — Z5181 Encounter for therapeutic drug level monitoring: Secondary | ICD-10-CM

## 2013-08-31 DIAGNOSIS — Z7902 Long term (current) use of antithrombotics/antiplatelets: Secondary | ICD-10-CM

## 2013-08-31 DIAGNOSIS — Z452 Encounter for adjustment and management of vascular access device: Secondary | ICD-10-CM

## 2013-08-31 DIAGNOSIS — M25569 Pain in unspecified knee: Secondary | ICD-10-CM

## 2013-08-31 DIAGNOSIS — M25562 Pain in left knee: Secondary | ICD-10-CM | POA: Insufficient documentation

## 2013-08-31 DIAGNOSIS — Z0000 Encounter for general adult medical examination without abnormal findings: Secondary | ICD-10-CM

## 2013-08-31 DIAGNOSIS — Z23 Encounter for immunization: Secondary | ICD-10-CM

## 2013-08-31 DIAGNOSIS — I4891 Unspecified atrial fibrillation: Secondary | ICD-10-CM

## 2013-08-31 DIAGNOSIS — E785 Hyperlipidemia, unspecified: Secondary | ICD-10-CM

## 2013-08-31 DIAGNOSIS — I10 Essential (primary) hypertension: Secondary | ICD-10-CM

## 2013-08-31 LAB — HM DIABETES FOOT EXAM

## 2013-08-31 LAB — POCT INR: INR: 2

## 2013-08-31 MED ORDER — DICLOFENAC SODIUM 1 % TD GEL: 4.0000 g | Freq: Four times a day (QID) | TRANSDERMAL | Status: DC | PRN

## 2013-08-31 NOTE — Assessment & Plan Note (Signed)
Well controlled. Continue current medication.  

## 2013-08-31 NOTE — Patient Instructions (Addendum)
Schedule fasting labs in next week.  Stop by Angela Ali for INR on way out today.  Can try voltaren gel for left knee pain. Wear pull on neoprene brace. Use cane. If not improving  in 1-2 weeks, call or make appt with  [facility]THO. Call to schedule colonoscopy as planned.

## 2013-08-31 NOTE — Assessment & Plan Note (Signed)
Concerning for  Meniscal tear, but neg Mcmurray's. Most likely arthritis.  treat with topical voltaren gel.

## 2013-08-31 NOTE — Progress Notes (Signed)
Pre visit review using our clinic review tool, if applicable. No additional management support is needed unless otherwise documented below in the visit note. 

## 2013-08-31 NOTE — Assessment & Plan Note (Signed)
Due for re-eval. 

## 2013-08-31 NOTE — Progress Notes (Signed)
HPI  I have personally reviewed the Medicare Annual Wellness questionnaire and have noted  1. The patient's medical and social history  2. Their use of alcohol, tobacco or illicit drugs  3. Their current medications and supplements  4. The patient's functional ability including ADL's, fall risks, home safety risks and hearing or visual  impairment.  5. Diet and physical activities  6. Evidence for depression or mood disorders  The patients weight, height, BMI and visual acuity have been recorded in the chart  I have made referrals, counseling and provided education to the patient based review of the above and I have provided the pt with a written personalized care plan for preventive services.    She has been having left knee pain, worse in past few days. No known injury, no fall.  Occ clicking, mild swelling, no redness.  Cannot take NSAIDs due to coumadin.  Has had injections 4-6 years ago in left knee.  Elevated Cholesterol: Due for re-eval!!!  Lab Results  Component Value Date   CHOL 137 02/04/2012   HDL 40.50 02/04/2012   LDLCALC 72 02/04/2012   TRIG 121.0 02/04/2012   CHOLHDL 3 02/04/2012    Diet compliance:poor  Exercise: minimal due to left knee pain Other complaints:  Wt Readings from Last 3 Encounters:  08/31/13 188 lb (85.276 kg)  08/04/13 188 lb 4 oz (85.39 kg)  07/20/13 187 lb (84.823 kg)   Hypertension:well controlled on current regimen. BP Readings from Last 3 Encounters:  08/31/13 124/82  08/04/13 116/82  07/20/13 140/85  Using medication without problems or lightheadedness: None  Chest pain with exertion:None  Edema:None  Short of breath:None  Average home BPs: 120/70s  Other issues:Afib, CHF: euvolemic  Diabetes: Due for re-eval Lab Results  Component Value Date   HGBA1C 6.4 06/20/2012  Hypoglycemic episodes:None  Hyperglycemic episodes:None  Feet problems:None  Blood Sugars averaging: FBS: 113-120  eye exam within last year:08/07/2013  Review  of Systems  Constitutional: Negative for fever and fatigue.  HENT: Negative for ear pain.  Eyes: Negative for pain.  Respiratory: Negative for chest tightness and shortness of breath.  Cardiovascular: Negative for chest pain, palpitations and leg swelling.  Gastrointestinal: Negative for abdominal pain.  Genitourinary: Negative for dysuria.  Neurological: Negative for syncope and light-headedness.  Psychiatric/Behavioral: No depression.  Objective:   Physical Exam  Constitutional: Vital signs are normal. She appears well-developed and well-nourished. She is cooperative. Non-toxic appearance. She does not appear ill. No distress.  HENT:  Head: Normocephalic.  Right Ear: Hearing, tympanic membrane, external ear and ear canal normal.  Left Ear: Hearing, tympanic membrane, external ear and ear canal normal.  Nose: Nose normal.  Eyes: Conjunctivae, EOM and lids are normal. Pupils are equal, round, and reactive to light. No foreign bodies found.  Neck: Trachea normal and normal range of motion. Neck supple. Carotid bruit is not present. No mass and no thyromegaly present.  Cardiovascular: Normal rate, S1 normal, S2 normal and intact distal pulses. An irregular rhythm present. Exam reveals distant heart sounds. Exam reveals no gallop.  No murmur heard. Slight B peripheral edema  Pulmonary/Chest: Effort normal and breath sounds normal. No respiratory distress. She has no wheezes. She has no rhonchi. She has no rales.  Abdominal: Soft. Normal appearance and bowel sounds are normal. She exhibits no distension, no fluid wave, no abdominal bruit and no mass. There is no hepatosplenomegaly. There is no tenderness. There is no rebound, no guarding and no CVA tenderness. No hernia.  Lymphadenopathy:  She has no cervical adenopathy.  She has no axillary adenopathy.  Neurological: She is alert. She has normal strength. No cranial nerve deficit or sensory deficit.  Skin: Skin is warm, dry and intact. 2  cystic lesions .Marland Kitchen One on labia ( noted in past) and one on left inner thigh, no erythema , no redness, no warmth.  Psychiatric: Her speech is normal and behavior is normal. Judgment normal. Her mood appears not anxious. Cognition and memory are normal. She does not exhibit a depressed mood.  MSK: left knee, pain laterally, neg Mcmurray's, [pain with flexion, limping gait.  Diabetic foot exam:  Normal inspection  No skin breakdown  No calluses  Normal DP pulses  Normal sensation to light touch and monofilament  Nails thickened  Assessment & Plan:   AMW: The patient's preventative maintenance and recommended screening tests for an annual wellness exam were reviewed in full today.  Brought up to date unless services declined.  Counselled on the importance of diet, exercise, and its role in overall health and mortality.  The patient's FH and SH was reviewed, including their home life, tobacco status, and drug and alcohol status.   Vaccines:Uptodate with flu, PNA and Td, considering shingles vaccine, due for prevnar No indication for pap, DVE given age, hysterectomy.  Mammo: No family history of first degree relative. She wishes to continue mammograms. 06/2013 unilateral lelft repeat due in 6 months due to calcifications. Colon: Nml in 2009, per Dr. Deatra Ina repeat in 5 years (2014)  Nonsmoker.  DEXA:  Improving osteopenia, 06/2012,  Repeat 2-5 years.

## 2013-09-01 ENCOUNTER — Encounter: Payer: Self-pay | Admitting: *Deleted

## 2013-09-01 ENCOUNTER — Other Ambulatory Visit (INDEPENDENT_AMBULATORY_CARE_PROVIDER_SITE_OTHER): Payer: Commercial Managed Care - HMO

## 2013-09-01 DIAGNOSIS — E118 Type 2 diabetes mellitus with unspecified complications: Secondary | ICD-10-CM

## 2013-09-01 DIAGNOSIS — E785 Hyperlipidemia, unspecified: Secondary | ICD-10-CM

## 2013-09-01 LAB — COMPREHENSIVE METABOLIC PANEL
ALT: 13 U/L (ref 0–35)
AST: 14 U/L (ref 0–37)
Albumin: 3.8 g/dL (ref 3.5–5.2)
Alkaline Phosphatase: 71 U/L (ref 39–117)
BILIRUBIN TOTAL: 0.8 mg/dL (ref 0.2–1.2)
BUN: 13 mg/dL (ref 6–23)
CHLORIDE: 103 meq/L (ref 96–112)
CO2: 32 mEq/L (ref 19–32)
CREATININE: 0.9 mg/dL (ref 0.4–1.2)
Calcium: 10 mg/dL (ref 8.4–10.5)
GFR: 75.07 mL/min (ref >60.00)
Glucose, Bld: 117 mg/dL — ABNORMAL HIGH (ref 70–99)
Potassium: 4.1 mEq/L (ref 3.5–5.1)
Sodium: 141 mEq/L (ref 135–145)
Total Protein: 6.8 g/dL (ref 6.0–8.3)

## 2013-09-01 LAB — LIPID PANEL
Cholesterol: 129 mg/dL (ref 0–200)
HDL: 40.2 mg/dL (ref >39.00)
LDL CALC: 73 mg/dL (ref 0–99)
TRIGLYCERIDES: 77 mg/dL (ref 0.0–149.0)
Total CHOL/HDL Ratio: 3
VLDL: 15.4 mg/dL (ref 0.0–40.0)

## 2013-09-01 LAB — HEMOGLOBIN A1C: Hgb A1c MFr Bld: 6.4 % (ref 4.6–6.5)

## 2013-09-11 ENCOUNTER — Telehealth: Payer: Self-pay | Admitting: *Deleted

## 2013-09-11 MED ORDER — METOPROLOL SUCCINATE ER 100 MG PO TB24: 100.0000 mg | ORAL_TABLET | Freq: Every day | ORAL | Status: DC

## 2013-09-11 MED ORDER — WARFARIN SODIUM 5 MG PO TABS: 5.0000 mg | ORAL_TABLET | ORAL | Status: DC

## 2013-09-11 MED ORDER — DIGOXIN 125 MCG PO TABS: 0.1250 mg | ORAL_TABLET | Freq: Every day | ORAL | Status: DC

## 2013-09-11 MED ORDER — LOSARTAN POTASSIUM 100 MG PO TABS: 100.0000 mg | ORAL_TABLET | Freq: Every day | ORAL | Status: DC

## 2013-09-11 NOTE — Telephone Encounter (Signed)
Patient called and has a question on her Metoprolol. She is not sure what mg Dr. Rockey Situ put her on. Please call

## 2013-09-11 NOTE — Telephone Encounter (Signed)
Pt asked for refills on her cardiac meds.

## 2013-09-13 ENCOUNTER — Other Ambulatory Visit: Payer: Self-pay | Admitting: Family Medicine

## 2013-09-13 NOTE — Telephone Encounter (Signed)
Last office visit 08/31/2013.  Ok to refill?

## 2013-10-04 ENCOUNTER — Telehealth: Payer: Self-pay | Admitting: Gastroenterology

## 2013-10-04 NOTE — Telephone Encounter (Signed)
Pt wants to know if she really needs to have the colonoscopy. With her age he just wants to make sure she needs to have this done. Please advise.

## 2013-10-09 ENCOUNTER — Other Ambulatory Visit: Payer: Self-pay

## 2013-10-09 MED ORDER — OMEPRAZOLE 20 MG PO CPDR: 20.0000 mg | DELAYED_RELEASE_CAPSULE | Freq: Every day | ORAL | Status: DC

## 2013-10-09 NOTE — Telephone Encounter (Signed)
Pt left v/m requesting refill omeprazole to [facility]. Advised pt done.

## 2013-10-12 ENCOUNTER — Ambulatory Visit: Payer: Commercial Managed Care - HMO

## 2013-10-12 ENCOUNTER — Ambulatory Visit (INDEPENDENT_AMBULATORY_CARE_PROVIDER_SITE_OTHER): Payer: Commercial Managed Care - HMO | Admitting: Family Medicine

## 2013-10-12 DIAGNOSIS — Z5181 Encounter for therapeutic drug level monitoring: Secondary | ICD-10-CM

## 2013-10-12 DIAGNOSIS — Z7902 Long term (current) use of antithrombotics/antiplatelets: Secondary | ICD-10-CM

## 2013-10-12 DIAGNOSIS — Z452 Encounter for adjustment and management of vascular access device: Secondary | ICD-10-CM

## 2013-10-12 DIAGNOSIS — I4891 Unspecified atrial fibrillation: Secondary | ICD-10-CM

## 2013-10-12 LAB — POCT INR: INR: 2.2

## 2013-10-15 NOTE — Telephone Encounter (Signed)
i am unable to locate prior colo report

## 2013-10-20 ENCOUNTER — Other Ambulatory Visit: Payer: Self-pay | Admitting: Family Medicine

## 2013-10-20 MED ORDER — WARFARIN SODIUM 5 MG PO TABS: 5.0000 mg | ORAL_TABLET | ORAL | Status: DC

## 2013-10-25 ENCOUNTER — Telehealth: Payer: Self-pay

## 2013-10-25 DIAGNOSIS — R5381 Other malaise: Secondary | ICD-10-CM

## 2013-10-25 DIAGNOSIS — R5383 Other fatigue: Secondary | ICD-10-CM

## 2013-10-25 NOTE — Telephone Encounter (Signed)
Pt left v/m to see if OK for pt to take oral B12; if OK for pt to take B 12 pt wants to know dosage and instructions. Pt said pt feels tired on and off and thought B 12 might give her energy. Pt request cb. [place] Pt also wants to know if pt should have colonoscopy. Pt does not remember last colonoscopy.

## 2013-10-26 NOTE — Telephone Encounter (Signed)
Pt is aware as instructed--pt states she did not have colonoscopy since 2009 to her knowledge--she wanted to know if she is cleared to go ahead with the colonoscopy now and she states that Dr Deatra Ina is waiting on the okay to schedule it--please advise

## 2013-10-26 NOTE — Telephone Encounter (Signed)
Yes she can take b12 1000 mcg daily, but if she is feeling significantly more fatigued we should probably have her come in for labs to check B12, cbc, TSH vit D first.  Per last nml colonoscopy.Burgess Amor were:  Office Visit, to [facility]. Deatra Ina, MD, around Jan 31, 2013.  Colonoscopy, to [facility]. Deatra Ina, MD, around Jan 31, 2013.

## 2013-10-26 NOTE — Telephone Encounter (Signed)
Lab appointment scheduled for 10/30/2013 @ 9:30am.   Advised Ms. Teng to go ahead and call Dr. Kelby Fam office to get her colonoscopy scheduled.  Will forward note back to Dr. Diona Browner to place lab orders.

## 2013-10-26 NOTE — Telephone Encounter (Signed)
Yes due for colonoscopy.. Go ahead. Was she okay with scheduling labs?

## 2013-10-30 ENCOUNTER — Telehealth: Payer: Self-pay | Admitting: Family Medicine

## 2013-10-30 NOTE — Telephone Encounter (Signed)
I spoke with patient and rescheduled her appointment to 10/31/13 at 3:15 and she'll do her lab work at 3:00.

## 2013-10-30 NOTE — Telephone Encounter (Signed)
Patient Information:  Caller Name: Lynnetta  Phone: [phone]  Patient: Angela Ali, Angela Ali  Gender: Female  DOB: 1928/06/03  Age: 80 Years  PCP: Eliezer Lofts (Family Practice)  Office Follow Up:  Does the office need to follow up with this patient?: Yes  Instructions For The Office: Please call and schedule pt sooner if a sooner if possible per disposition. Pt's cell noted. Daughters cell: [phone]. May leave a message if needed.  RN Note:  Pt states there are no other appts on 7/6-11/15 with Dr. Diona Browner and that she prefers to only see her. Has an appt for 11/03/13 at 0900 with labs at 0930. Please call pt if she should be seen sooner per disposition. Pt to call back with ANY chx in sx.  Symptoms  Reason For Call & Symptoms: Pt calling regarding pain that travels from bottom of ear down neck (outside) for about a month, but worse since 10/29/13. No s/s or hx of stroke. Has an appt with Dr. Diona Browner on 11/03/13.  Reviewed Health History In EMR: Yes  Reviewed Medications In EMR: Yes  Reviewed Allergies In EMR: Yes  Reviewed Surgeries / Procedures: Yes  Date of Onset of Symptoms: 09/30/2013  Treatments Tried: Tylenol Arthritis  Treatments Tried Worked: Yes  Guideline(s) Used:  Neck Pain or Stiffness  Disposition Per Guideline:   See Within 3 Days in Office  Reason For Disposition Reached:   Neck pain persists > 2 weeks  Advice Given:  N/A  Patient Will Follow Care Advice:  YES

## 2013-10-30 NOTE — Telephone Encounter (Signed)
Morey Hummingbird, Can you open up Dr. Rometta Emery 3:15pm slot tomorrow and call and reschedule Ms. Tardiff from 7/10.  Thanks, Butch Penny

## 2013-10-31 ENCOUNTER — Other Ambulatory Visit: Payer: Commercial Managed Care - HMO

## 2013-10-31 ENCOUNTER — Other Ambulatory Visit (INDEPENDENT_AMBULATORY_CARE_PROVIDER_SITE_OTHER): Payer: Commercial Managed Care - HMO

## 2013-10-31 ENCOUNTER — Encounter: Payer: Self-pay | Admitting: Family Medicine

## 2013-10-31 ENCOUNTER — Telehealth: Payer: Self-pay | Admitting: Family Medicine

## 2013-10-31 ENCOUNTER — Ambulatory Visit (INDEPENDENT_AMBULATORY_CARE_PROVIDER_SITE_OTHER): Payer: Commercial Managed Care - HMO | Admitting: Family Medicine

## 2013-10-31 ENCOUNTER — Telehealth: Payer: Self-pay | Admitting: Gastroenterology

## 2013-10-31 VITALS — BP 132/80 | HR 91 | Temp 98.4°F | Wt 185.5 lb

## 2013-10-31 DIAGNOSIS — E785 Hyperlipidemia, unspecified: Secondary | ICD-10-CM

## 2013-10-31 DIAGNOSIS — I10 Essential (primary) hypertension: Secondary | ICD-10-CM

## 2013-10-31 DIAGNOSIS — R5383 Other fatigue: Secondary | ICD-10-CM

## 2013-10-31 DIAGNOSIS — S139XXA Sprain of joints and ligaments of unspecified parts of neck, initial encounter: Secondary | ICD-10-CM

## 2013-10-31 DIAGNOSIS — M810 Age-related osteoporosis without current pathological fracture: Secondary | ICD-10-CM

## 2013-10-31 DIAGNOSIS — E118 Type 2 diabetes mellitus with unspecified complications: Secondary | ICD-10-CM

## 2013-10-31 DIAGNOSIS — M25519 Pain in unspecified shoulder: Secondary | ICD-10-CM

## 2013-10-31 DIAGNOSIS — S161XXA Strain of muscle, fascia and tendon at neck level, initial encounter: Secondary | ICD-10-CM

## 2013-10-31 DIAGNOSIS — M25512 Pain in left shoulder: Secondary | ICD-10-CM

## 2013-10-31 DIAGNOSIS — R21 Rash and other nonspecific skin eruption: Secondary | ICD-10-CM

## 2013-10-31 DIAGNOSIS — R5381 Other malaise: Secondary | ICD-10-CM

## 2013-10-31 MED ORDER — CYCLOBENZAPRINE HCL 5 MG PO TABS: 5.0000 mg | ORAL_TABLET | Freq: Every day | ORAL | Status: DC

## 2013-10-31 MED ORDER — GLUCOSE BLOOD VI STRP: ORAL_STRIP | Status: DC

## 2013-10-31 NOTE — Progress Notes (Signed)
Pre visit review using our clinic review tool, if applicable. No additional management support is needed unless otherwise documented below in the visit note. 

## 2013-10-31 NOTE — Telephone Encounter (Signed)
Pt states she is due for colon but wants to know if she really wants to have one done due to her age. States she will speak with her PCP and decide if she wants to have a colon done and call us back if she decides to schedule the colon.

## 2013-10-31 NOTE — Assessment & Plan Note (Signed)
Shoulder bursitis vs rotator cuff.  Continue diclofenac gel, start home PT ( info given).  If not improving considr referral to sports med for injection.  NSAIDs contraindicated in elderly lady on coumadin.

## 2013-10-31 NOTE — Patient Instructions (Addendum)
Apply hydrocortisone cream to itchy spot on right breast twoice daily x 2 weeks. If not resolving call/ follow up.  Start neck stretches and home PT for shoulder. Use muscle relaxant for neck pain.  If not improving in 2 weeks call for referral for shoulder injection.

## 2013-10-31 NOTE — Assessment & Plan Note (Signed)
No clear rash, just area of itchiness. Nml recent mammo.  Will treat with hydrocortisone cream BID x 2 weeks. Follow up if not improving.

## 2013-10-31 NOTE — Progress Notes (Signed)
Subjective:    Patient ID: Angela Ali, female    DOB: Jun 02, 1928, 80 y.o.   MRN: [id number]  Neck Pain  Pertinent negatives include no chest pain, fever, headaches or weakness.    80 year old female with history of chronic low back apin and herniated disc and osteoporosis presents with new onset  Left neck pain ongoing x 1-2 months. Gradually worsening, increased pain in last 3 days. Pain is intermittent, lasts  < 5 min. Describes as tightness over lateral neck. Pain with bending neck.  No pain radiating to arm, no weakness and numbness.   She reports  she has also been having some pain in lateral shoulder with abduction, int and ext rotation.   Using tylenol for pain. Occ using diclofenac gel on shoulder and knees. Helps minimally.  No recent falls, or known injury.   Today, this AM, noted itchiness in right lateral breast, no mass. No rash.   06/2013 nml mammogram on right  Review of Systems  Constitutional: Negative for fever and fatigue.  HENT: Negative for ear pain.   Eyes: Negative for pain.  Respiratory: Negative for chest tightness and shortness of breath.   Cardiovascular: Negative for chest pain, palpitations and leg swelling.  Gastrointestinal: Negative for abdominal pain.  Genitourinary: Negative for dysuria.  Musculoskeletal: Positive for neck pain.  Neurological: Negative for tremors, weakness, light-headedness and headaches.       Objective:   Physical Exam  Constitutional: Vital signs are normal. She appears well-developed and well-nourished. She is cooperative.  Non-toxic appearance. She does not appear ill. No distress.  HENT:  Head: Normocephalic.  Right Ear: Hearing, tympanic membrane, external ear and ear canal normal. Tympanic membrane is not erythematous, not retracted and not bulging.  Left Ear: Hearing, tympanic membrane, external ear and ear canal normal. Tympanic membrane is not erythematous, not retracted and not bulging.  Nose: No mucosal  edema or rhinorrhea. Right sinus exhibits no maxillary sinus tenderness and no frontal sinus tenderness. Left sinus exhibits no maxillary sinus tenderness and no frontal sinus tenderness.  Mouth/Throat: Uvula is midline, oropharynx is clear and moist and mucous membranes are normal.  Eyes: Conjunctivae, EOM and lids are normal. Pupils are equal, round, and reactive to light. Lids are everted and swept, no foreign bodies found.  Neck: Trachea normal and normal range of motion. Neck supple. Carotid bruit is not present. No mass and no thyromegaly present.  Cardiovascular: Normal rate, regular rhythm, S1 normal, S2 normal, normal heart sounds, intact distal pulses and normal pulses.  Exam reveals no gallop and no friction rub.   No murmur heard. Pulmonary/Chest: Effort normal and breath sounds normal. Not tachypneic. No respiratory distress. She has no decreased breath sounds. She has no wheezes. She has no rhonchi. She has no rales.  Abdominal: Soft. Normal appearance and bowel sounds are normal. There is no tenderness.  Musculoskeletal:       Right shoulder: She exhibits normal range of motion, no tenderness and no bony tenderness.       Left shoulder: She exhibits decreased range of motion and tenderness. She exhibits no bony tenderness.       Cervical back: She exhibits decreased range of motion and tenderness. She exhibits no bony tenderness.  pain with int and ext rotation in left shoulder  pain with lateral rotation of neck, ttp over trapezius and SCM  On left  neg spurling's  pos impingement sign.  Neurological: She is alert.  Skin: Skin is warm,  dry and intact. No rash noted.  Psychiatric: Her speech is normal and behavior is normal. Judgment and thought content normal. Her mood appears not anxious. Cognition and memory are normal. She does not exhibit a depressed mood.          Assessment & Plan:

## 2013-10-31 NOTE — Assessment & Plan Note (Signed)
Start muscle relaxant prn. Home stretching exercises. Info given.

## 2013-10-31 NOTE — Telephone Encounter (Signed)
Relevant patient education mailed to patient.  

## 2013-11-01 LAB — COMPREHENSIVE METABOLIC PANEL
ALBUMIN: 4.2 g/dL (ref 3.5–5.2)
ALK PHOS: 86 U/L (ref 39–117)
ALT: 15 U/L (ref 0–35)
AST: 16 U/L (ref 0–37)
BILIRUBIN TOTAL: 0.4 mg/dL (ref 0.2–1.2)
BUN: 24 mg/dL — ABNORMAL HIGH (ref 6–23)
CO2: 30 mEq/L (ref 19–32)
CREATININE: 0.9 mg/dL (ref 0.4–1.2)
Calcium: 10.2 mg/dL (ref 8.4–10.5)
Chloride: 104 mEq/L (ref 96–112)
GFR: 76.96 mL/min (ref >60.00)
Glucose, Bld: 100 mg/dL — ABNORMAL HIGH (ref 70–99)
Potassium: 4.2 mEq/L (ref 3.5–5.1)
Sodium: 140 mEq/L (ref 135–145)
Total Protein: 7.3 g/dL (ref 6.0–8.3)

## 2013-11-01 LAB — CBC WITH DIFFERENTIAL/PLATELET
Basophils Absolute: 0 10*3/uL (ref 0.0–0.1)
Basophils Relative: 0.4 % (ref 0.0–3.0)
EOS ABS: 0.3 10*3/uL (ref 0.0–0.7)
EOS PCT: 3 % (ref 0.0–5.0)
HEMATOCRIT: 37.1 % (ref 36.0–46.0)
HEMOGLOBIN: 12.5 g/dL (ref 12.0–15.0)
LYMPHS ABS: 2.5 10*3/uL (ref 0.7–4.0)
Lymphocytes Relative: 26.1 % (ref 12.0–46.0)
MCHC: 33.7 g/dL (ref 30.0–36.0)
MCV: 87.4 fl (ref 78.0–100.0)
Monocytes Absolute: 0.5 10*3/uL (ref 0.1–1.0)
Monocytes Relative: 5.3 % (ref 3.0–12.0)
NEUTROS ABS: 6.3 10*3/uL (ref 1.4–7.7)
NEUTROS PCT: 65.2 % (ref 43.0–77.0)
Platelets: 245 10*3/uL (ref 150.0–400.0)
RBC: 4.25 Mil/uL (ref 3.87–5.11)
RDW: 16.1 % — ABNORMAL HIGH (ref 11.5–15.5)
WBC: 9.6 10*3/uL (ref 4.0–10.5)

## 2013-11-01 LAB — VITAMIN D 25 HYDROXY (VIT D DEFICIENCY, FRACTURES): VITD: 47.89 ng/mL

## 2013-11-01 LAB — TSH: TSH: 0.61 u[IU]/mL (ref 0.35–4.50)

## 2013-11-01 LAB — VITAMIN B12: Vitamin B-12: 467 pg/mL (ref 211–911)

## 2013-11-03 ENCOUNTER — Other Ambulatory Visit: Payer: Commercial Managed Care - HMO

## 2013-11-03 ENCOUNTER — Ambulatory Visit: Payer: Commercial Managed Care - HMO | Admitting: Family Medicine

## 2013-11-20 ENCOUNTER — Ambulatory Visit: Payer: Commercial Managed Care - HMO

## 2013-11-21 ENCOUNTER — Ambulatory Visit (INDEPENDENT_AMBULATORY_CARE_PROVIDER_SITE_OTHER): Payer: Commercial Managed Care - HMO | Admitting: Family Medicine

## 2013-11-21 DIAGNOSIS — I4891 Unspecified atrial fibrillation: Secondary | ICD-10-CM

## 2013-11-21 DIAGNOSIS — Z452 Encounter for adjustment and management of vascular access device: Secondary | ICD-10-CM

## 2013-11-21 DIAGNOSIS — Z7902 Long term (current) use of antithrombotics/antiplatelets: Secondary | ICD-10-CM

## 2013-11-21 DIAGNOSIS — Z5181 Encounter for therapeutic drug level monitoring: Secondary | ICD-10-CM

## 2013-11-21 LAB — POCT INR: INR: 2.8

## 2013-11-23 ENCOUNTER — Ambulatory Visit: Payer: Commercial Managed Care - HMO

## 2014-01-04 ENCOUNTER — Telehealth: Payer: Self-pay | Admitting: Family Medicine

## 2014-01-04 ENCOUNTER — Ambulatory Visit (INDEPENDENT_AMBULATORY_CARE_PROVIDER_SITE_OTHER): Payer: Commercial Managed Care - HMO | Admitting: Family Medicine

## 2014-01-04 DIAGNOSIS — Z5181 Encounter for therapeutic drug level monitoring: Secondary | ICD-10-CM

## 2014-01-04 DIAGNOSIS — Z7902 Long term (current) use of antithrombotics/antiplatelets: Secondary | ICD-10-CM

## 2014-01-04 DIAGNOSIS — I4891 Unspecified atrial fibrillation: Secondary | ICD-10-CM

## 2014-01-04 DIAGNOSIS — Z452 Encounter for adjustment and management of vascular access device: Secondary | ICD-10-CM

## 2014-01-04 LAB — POCT INR: INR: 3.2

## 2014-01-04 MED ORDER — OMEPRAZOLE 20 MG PO CPDR: 20.0000 mg | DELAYED_RELEASE_CAPSULE | Freq: Two times a day (BID) | ORAL | Status: DC

## 2014-01-04 NOTE — Telephone Encounter (Signed)
Notify pt she does not need referral as she has seen Dr. Deatra Ina in last few months. She just needs to call there.  I will CC a note to Dr. Deatra Ina to make him aware   In [facility] last  Endo and colonoscopy was 10/72009

## 2014-01-04 NOTE — Telephone Encounter (Signed)
Angela Ali notified to call Dr. Kelby Fam office to schedule colonoscopy/endoscopy.  Dr. Kelby Fam office phone number given to Angela Ali. While here today getting her PT/INR checked to ask me to check with Dr. Diona Browner to see if she could double her stomach medication.  Advised per Dr. Diona Browner she can take omeprazole 20 mg, two tablets daily.  New prescription sent to [facility].

## 2014-01-04 NOTE — Telephone Encounter (Signed)
Okay for direct referral colonoscopy.  Is it permissible to hold her Coumadin for 5 days prior to procedure?

## 2014-01-04 NOTE — Telephone Encounter (Signed)
Yes, she can hold coumadin  5 days prior to procedure. Thanks.

## 2014-01-04 NOTE — Telephone Encounter (Signed)
Pt would like to know if a referral can be placed for an endoscopy and colonoscopy.  She said she is overdue and has called [facility] but no one has returned her call.

## 2014-01-09 ENCOUNTER — Telehealth: Payer: Self-pay

## 2014-01-09 NOTE — Telephone Encounter (Signed)
Message copied by Greggory Keen on Tue Jan 09, 2014 11:14 AM ------      Message from: Erskine Emery D      Created: Fri Jan 05, 2014  9:36 AM       Please schedule colonoscopy.  She should hold Coumadin 5 days prior to the procedure.  Also, ask patient if she's having dysphagia.  If so schedule an upper endoscopy with dilation at the same time.        Thanks ------

## 2014-01-09 NOTE — Telephone Encounter (Signed)
Spoke with the patient at length. She is presently without any swallowing issues. She does want to have her daughter's colonoscopy scheduled at the same time and this seems to be her main focus at this time. Appointments made. Appointment ionformation mailed to her also.

## 2014-01-15 ENCOUNTER — Telehealth: Payer: Self-pay | Admitting: *Deleted

## 2014-01-15 DIAGNOSIS — R928 Other abnormal and inconclusive findings on diagnostic imaging of breast: Secondary | ICD-10-CM

## 2014-01-15 NOTE — Telephone Encounter (Signed)
Pt needs an order sent to [facility] for a 6 mo f/u left unilateral diagnostic mammo. Per pt order needs to be faxed to [facility] at [phone]

## 2014-01-16 NOTE — Telephone Encounter (Signed)
Order faxed to [facility] [phone].  [name] notified order has been faxed to [facility].  Appointment is scheduled for 02/01/2014.

## 2014-01-25 ENCOUNTER — Telehealth: Payer: Self-pay | Admitting: Family Medicine

## 2014-01-25 NOTE — Telephone Encounter (Signed)
Patient is asking if she can drink Organic Aloe Vera Juice with her Coumadin.

## 2014-01-26 NOTE — Telephone Encounter (Signed)
Please have her drop by a list of ingredients.

## 2014-01-26 NOTE — Telephone Encounter (Signed)
Informed pt of Dr. Morrie Sheldon message and pt will bring in ingredients on Monday when she is here for her flu shot.

## 2014-01-26 NOTE — Telephone Encounter (Signed)
Left message for Angela Ali that she will need to drop by a list of ingredients for the Organic Aloe Vera Juice before Dr. Diona Browner can say whether or not it is okay for her to drink with her being on coumadin.

## 2014-01-29 ENCOUNTER — Ambulatory Visit (INDEPENDENT_AMBULATORY_CARE_PROVIDER_SITE_OTHER): Payer: Commercial Managed Care - HMO

## 2014-01-29 DIAGNOSIS — Z23 Encounter for immunization: Secondary | ICD-10-CM

## 2014-02-02 ENCOUNTER — Encounter: Payer: Self-pay | Admitting: Family Medicine

## 2014-02-12 ENCOUNTER — Ambulatory Visit (INDEPENDENT_AMBULATORY_CARE_PROVIDER_SITE_OTHER): Payer: Commercial Managed Care - HMO | Admitting: *Deleted

## 2014-02-12 DIAGNOSIS — I4891 Unspecified atrial fibrillation: Secondary | ICD-10-CM

## 2014-02-12 DIAGNOSIS — Z452 Encounter for adjustment and management of vascular access device: Secondary | ICD-10-CM

## 2014-02-12 DIAGNOSIS — Z7902 Long term (current) use of antithrombotics/antiplatelets: Secondary | ICD-10-CM

## 2014-02-12 DIAGNOSIS — Z5181 Encounter for therapeutic drug level monitoring: Secondary | ICD-10-CM

## 2014-02-12 LAB — POCT INR: INR: 2.7

## 2014-02-16 ENCOUNTER — Encounter: Payer: Self-pay | Admitting: Family Medicine

## 2014-02-16 ENCOUNTER — Ambulatory Visit (INDEPENDENT_AMBULATORY_CARE_PROVIDER_SITE_OTHER): Payer: Commercial Managed Care - HMO | Admitting: Family Medicine

## 2014-02-16 ENCOUNTER — Ambulatory Visit (INDEPENDENT_AMBULATORY_CARE_PROVIDER_SITE_OTHER)
Admission: RE | Admit: 2014-02-16 | Discharge: 2014-02-16 | Disposition: A | Discharge status: Home / Self Care | Payer: Commercial Managed Care - HMO | Source: Ambulatory Visit | Attending: Family Medicine | Admitting: Family Medicine

## 2014-02-16 VITALS — BP 172/108 | HR 91 | Temp 99.7°F | Ht 63.0 in | Wt 192.5 lb

## 2014-02-16 DIAGNOSIS — R059 Cough, unspecified: Secondary | ICD-10-CM

## 2014-02-16 DIAGNOSIS — R05 Cough: Secondary | ICD-10-CM

## 2014-02-16 DIAGNOSIS — J189 Pneumonia, unspecified organism: Secondary | ICD-10-CM | POA: Insufficient documentation

## 2014-02-16 DIAGNOSIS — I10 Essential (primary) hypertension: Secondary | ICD-10-CM

## 2014-02-16 LAB — POCT INFLUENZA A/B
INFLUENZA A, POC: NEGATIVE
Influenza B, POC: NEGATIVE

## 2014-02-16 MED ORDER — DOXYCYCLINE HYCLATE 100 MG PO TABS: 100.0000 mg | ORAL_TABLET | Freq: Two times a day (BID) | ORAL | Status: DC

## 2014-02-16 MED ORDER — GUAIFENESIN-CODEINE 100-10 MG/5ML PO SYRP: 5.0000 mL | ORAL_SOLUTION | Freq: Every evening | ORAL | Status: DC | PRN

## 2014-02-16 NOTE — Progress Notes (Signed)
   Subjective:    Patient ID: Angela Ali, female    DOB: 09/27/1928, 80 y.o.   MRN: [id number]  HPI Comments: HX of Afib and CHF  BP is elevated today. Usually well controlled. BP Readings from Last 3 Encounters: 02/16/14 : 172/108 10/31/13 : 132/80 08/31/13 : 124/82  She has not taken any meds this morning.   Cough This is a new problem. The current episode started in the past 7 days. The problem has been rapidly worsening. The problem occurs constantly. The cough is productive of sputum. Associated symptoms include chills, a fever, headaches, myalgias, nasal congestion and a sore throat. Pertinent negatives include no ear congestion, ear pain, postnasal drip or shortness of breath. Associated symptoms comments: Bloating diarrhea, nausea  fatigue  subjective fever  no sinus pain. The symptoms are aggravated by lying down. Risk factors: nonsmoker. Treatments tried: benzonatate, drinking tea with lemon. The treatment provided mild relief.    Low grade temp in office today.  She received flu shot few weeks ago.   Review of Systems  Constitutional: Positive for fever and chills.  HENT: Positive for sore throat. Negative for ear pain and postnasal drip.   Respiratory: Positive for cough. Negative for shortness of breath.   Musculoskeletal: Positive for myalgias.  Neurological: Positive for headaches.       Objective:   Physical Exam  Constitutional: Vital signs are normal. She appears well-developed and well-nourished. She is cooperative.  Non-toxic appearance. She does not appear ill. No distress.  HENT:  Head: Normocephalic.  Right Ear: Hearing, tympanic membrane, external ear and ear canal normal. Tympanic membrane is not erythematous, not retracted and not bulging.  Left Ear: Hearing, tympanic membrane, external ear and ear canal normal. Tympanic membrane is not erythematous, not retracted and not bulging.  Nose: No mucosal edema or rhinorrhea. Right sinus exhibits no  maxillary sinus tenderness and no frontal sinus tenderness. Left sinus exhibits no maxillary sinus tenderness and no frontal sinus tenderness.  Mouth/Throat: Uvula is midline, oropharynx is clear and moist and mucous membranes are normal.  Eyes: Conjunctivae, EOM and lids are normal. Pupils are equal, round, and reactive to light. Lids are everted and swept, no foreign bodies found.  Neck: Trachea normal and normal range of motion. Neck supple. Carotid bruit is not present. No mass and no thyromegaly present.  Cardiovascular: Normal rate, S1 normal, S2 normal, normal heart sounds, intact distal pulses and normal pulses.  An irregularly irregular rhythm present. Exam reveals no gallop and no friction rub.   No murmur heard. Pulmonary/Chest: Effort normal. Not tachypneic. No respiratory distress. She has decreased breath sounds in the right middle field and the right lower field. She has no wheezes. She has no rhonchi. She has no rales.  Abdominal: Soft. Normal appearance and bowel sounds are normal. There is no tenderness.  Neurological: She is alert.  Skin: Skin is warm, dry and intact. No rash noted.  Psychiatric: Her speech is normal and behavior is normal. Judgment and thought content normal. Her mood appears not anxious. Cognition and memory are normal. She does not exhibit a depressed mood.          Assessment & Plan:

## 2014-02-16 NOTE — Progress Notes (Signed)
Pre visit review using our clinic review tool, if applicable. No additional management support is needed unless otherwise documented below in the visit note. 

## 2014-02-16 NOTE — Patient Instructions (Addendum)
Take your medication when you get home.  Follow BP at home over next few days.  Call if continues to be elevated. Complete antibiotics.  Return for PT INR on Monday.  Can use cough suppressant with codeine at night.  Follow up in 1 week.

## 2014-02-16 NOTE — Assessment & Plan Note (Signed)
Likely elevated due to illness and pt has not yet taken BP meds or lasix this AM.

## 2014-02-19 ENCOUNTER — Ambulatory Visit: Payer: Commercial Managed Care - HMO

## 2014-02-19 ENCOUNTER — Telehealth: Payer: Self-pay | Admitting: *Deleted

## 2014-02-19 ENCOUNTER — Telehealth: Payer: Self-pay

## 2014-02-19 ENCOUNTER — Ambulatory Visit (INDEPENDENT_AMBULATORY_CARE_PROVIDER_SITE_OTHER): Payer: Commercial Managed Care - HMO | Admitting: *Deleted

## 2014-02-19 DIAGNOSIS — Z5181 Encounter for therapeutic drug level monitoring: Secondary | ICD-10-CM

## 2014-02-19 LAB — POCT INR: INR: 2.1

## 2014-02-19 NOTE — Telephone Encounter (Signed)
John,  This lady is scheduled for a double with Dr Deatra Ina and she has a very extensive health history and lots of cardiac issues. Please look through her chart and let me know if you think she's okay  for [place].   Thanks  [name] PV

## 2014-02-19 NOTE — Telephone Encounter (Signed)
Pt states she is supposed to have a colonoscopy on 11/5, needs directions for her coumadin. Pt gets her coumadin checked at [facility], states she got it checked this a.m. And it was 2.1

## 2014-02-19 NOTE — Telephone Encounter (Signed)
It is okay for her to come off Coumadin Typically we will hold Coumadin for 3 days prior to the procedure Ideally we let the practice managing her Coumadin do this for her   Ideally 3 days should be enough, restart warfarin after the procedure is complete

## 2014-02-20 NOTE — Telephone Encounter (Signed)
Angela Ali,  This pt is cleared for care at [facility].  Thank you for checking.  Best,  Osvaldo Angst

## 2014-02-20 NOTE — Telephone Encounter (Signed)
Spoke w/ pt.  Advised her of Dr. Donivan Scull recommendation.  She verbalizes understanding.  Pt states that she has pneumonia and is wondering if she should proceed w/ colonoscopy.  Advised her to contact performing MD to inquire.  She verbalizes understanding and will call back w/ any further questions or concerns.

## 2014-02-21 NOTE — Telephone Encounter (Signed)
Noted. Will proceed as scheduled.   Angela Ali  PV

## 2014-02-22 ENCOUNTER — Ambulatory Visit (AMBULATORY_SURGERY_CENTER): Payer: Self-pay | Admitting: *Deleted

## 2014-02-22 ENCOUNTER — Telehealth: Payer: Self-pay | Admitting: *Deleted

## 2014-02-22 VITALS — Ht 64.0 in | Wt 187.6 lb

## 2014-02-22 DIAGNOSIS — R1310 Dysphagia, unspecified: Secondary | ICD-10-CM

## 2014-02-22 DIAGNOSIS — Z86010 Personal history of colonic polyps: Secondary | ICD-10-CM

## 2014-02-22 MED ORDER — NA SULFATE-K SULFATE-MG SULF 17.5-3.13-1.6 GM/177ML PO SOLN: 1.0000 | Freq: Once | ORAL | Status: DC

## 2014-02-22 NOTE — Progress Notes (Signed)
No home 02 use. ewm No diet pills. ewm No issues with past sedation. ewm No egg or soy allergy. ewm

## 2014-02-22 NOTE — Telephone Encounter (Signed)
Dr Deatra Ina,  I saw Angela Ali in [facility] today. She is scheduled for an endo/colon on 11-5 with you. She wanted to ask you if you felt it was a necessity she have these procedures. She was dx'd with pneumonia 10-23 and she still has a very dry cough that's persistent and shes concerned about having an endo, she states she has no difficulty swallowing, she has no issues with her bowels and she will be 85 on 11-4. i didn't know if you wanted to see her and possibly do a colo guard or just proceed as planned. She asked to ask you and get back with her asap.  Her daughter will also be coming 11-5 for a colon.   Thanks for your time. Please advise.  [name] [facility]  Pt's cell [phone] [place] daughter's [phone]

## 2014-02-23 NOTE — Telephone Encounter (Signed)
I'm not sure why she was scheduled for an endo. Since she's relunctant lets get a cologuard test instead.

## 2014-02-23 NOTE — Telephone Encounter (Signed)
Patient calls back. Explained to her the plan of cancelling her procedures. Cologuard requisition form completed and faxed.

## 2014-02-26 NOTE — Telephone Encounter (Signed)
Pt called by dr Kelby Fam nurse. Procedures cancelled. To do cologuard. ewm See TE.  [name] PV

## 2014-03-01 ENCOUNTER — Encounter: Payer: Commercial Managed Care - HMO | Admitting: Gastroenterology

## 2014-03-05 ENCOUNTER — Telehealth: Payer: Self-pay | Admitting: Gastroenterology

## 2014-03-05 NOTE — Telephone Encounter (Signed)
Form was faxed without the doctor signature. Corrected and faxed. Patient notified.

## 2014-03-06 ENCOUNTER — Encounter: Payer: Self-pay | Admitting: Family Medicine

## 2014-03-06 ENCOUNTER — Ambulatory Visit (INDEPENDENT_AMBULATORY_CARE_PROVIDER_SITE_OTHER): Payer: Commercial Managed Care - HMO | Admitting: Family Medicine

## 2014-03-06 VITALS — BP 150/78 | HR 60 | Temp 98.6°F | Ht 63.0 in | Wt 187.0 lb

## 2014-03-06 DIAGNOSIS — J189 Pneumonia, unspecified organism: Secondary | ICD-10-CM

## 2014-03-06 DIAGNOSIS — E785 Hyperlipidemia, unspecified: Secondary | ICD-10-CM

## 2014-03-06 DIAGNOSIS — I10 Essential (primary) hypertension: Secondary | ICD-10-CM

## 2014-03-06 DIAGNOSIS — E118 Type 2 diabetes mellitus with unspecified complications: Secondary | ICD-10-CM

## 2014-03-06 LAB — LIPID PANEL
Cholesterol: 173 mg/dL (ref 0–200)
HDL: 38 mg/dL — AB (ref >39.00)
LDL Cholesterol: 113 mg/dL — ABNORMAL HIGH (ref 0–99)
NONHDL: 135
Total CHOL/HDL Ratio: 5
Triglycerides: 109 mg/dL (ref 0.0–149.0)
VLDL: 21.8 mg/dL (ref 0.0–40.0)

## 2014-03-06 LAB — COMPREHENSIVE METABOLIC PANEL
ALBUMIN: 3.5 g/dL (ref 3.5–5.2)
ALT: 10 U/L (ref 0–35)
AST: 12 U/L (ref 0–37)
Alkaline Phosphatase: 72 U/L (ref 39–117)
BUN: 15 mg/dL (ref 6–23)
CO2: 25 meq/L (ref 19–32)
Calcium: 10.2 mg/dL (ref 8.4–10.5)
Chloride: 101 mEq/L (ref 96–112)
Creatinine, Ser: 0.9 mg/dL (ref 0.4–1.2)
GFR: 78.92 mL/min (ref >60.00)
Glucose, Bld: 112 mg/dL — ABNORMAL HIGH (ref 70–99)
POTASSIUM: 3.7 meq/L (ref 3.5–5.1)
SODIUM: 140 meq/L (ref 135–145)
TOTAL PROTEIN: 6.9 g/dL (ref 6.0–8.3)
Total Bilirubin: 0.4 mg/dL (ref 0.2–1.2)

## 2014-03-06 LAB — HM DIABETES FOOT EXAM

## 2014-03-06 LAB — HEMOGLOBIN A1C: Hgb A1c MFr Bld: 6.1 % (ref 4.6–6.5)

## 2014-03-06 MED ORDER — GLUCOSE BLOOD VI STRP: ORAL_STRIP | Status: DC

## 2014-03-06 MED ORDER — CYCLOBENZAPRINE HCL 5 MG PO TABS: 5.0000 mg | ORAL_TABLET | Freq: Every day | ORAL | Status: DC

## 2014-03-06 NOTE — Assessment & Plan Note (Signed)
Due for re-eval. 

## 2014-03-06 NOTE — Assessment & Plan Note (Signed)
Well controlled. Continue current medication.  

## 2014-03-06 NOTE — Progress Notes (Signed)
Pre visit review using our clinic review tool, if applicable. No additional management support is needed unless otherwise documented below in the visit note. 

## 2014-03-06 NOTE — Assessment & Plan Note (Signed)
Resolved infection, post inflammatory cough remains. Treat with cough meds as needed.

## 2014-03-06 NOTE — Assessment & Plan Note (Signed)
Due for re-eval. Encouraged exercise, weight loss, healthy eating habits.

## 2014-03-06 NOTE — Progress Notes (Signed)
80 year old female presents for 6 month follow up.  She has noted cough in last month. Using tussionex or benzonatate.  No fever.  No longer feels weak. Saw her on 10/23 Dx with [facility], completed all antibiotics (doxycycline), improved but cough remains.  Cough is mainly dry, no longer productive. Nausea  At times.  02/16/2014 CXR: IMPRESSION: Cardiomegaly. Central vascular congestion without pulmonary edema. Streaky right base laterally atelectasis or infiltrate  Elevated Cholesterol: At goal at last check on lipitor.  Lab Results  Component Value Date   CHOL 129 09/01/2013   HDL 40.20 09/01/2013   LDLCALC 73 09/01/2013   TRIG 77.0 09/01/2013   CHOLHDL 3 09/01/2013  Using medications without problems:  Muscle aches:  Diet compliance:poor  Exercise:None due to recovering from hematoma  Other complaints:   Hypertension:Improved control on losartan, metoprolol, lasix, cardizem, lanoxin,HCTZ 25 mg daily. Has appt with Dr. Rockey Situ next week.  BP Readings from Last 3 Encounters:  03/06/14 150/78  02/16/14 172/108  10/31/13 132/80   Now only occasional episodes of heart racing. Has not needed any extra doses of diltiazem for heart racing.  Using medication without problems or lightheadedness: None  Chest pain with exertion:None  Edema:stable Short of breath:None  Average home BPs:  stable  Other issues:Afib, CHF: euvolemic   Diabetes: Due for re-eval. no medicaitons.  Lab Results  Component Value Date   HGBA1C 6.4 09/01/2013  Hypoglycemic episodes:None  Hyperglycemic episodes:None  Feet problems:None  Blood Sugars averaging: FBS: 111 eye exam within last year:yes   Review of Systems  Constitutional: Negative for fever and fatigue.  HENT: Negative for ear pain.  Eyes: Negative for pain.  Respiratory: Negative for chest tightness and shortness of breath.  Cardiovascular: Negative for chest pain, palpitations and leg swelling.  Gastrointestinal:  Negative for abdominal pain.  Genitourinary: Negative for dysuria.  Neurological: Negative for syncope and light-headedness.  Psychiatric/Behavioral: Positive for dysphoric mood.  Objective:   Physical Exam  Constitutional: Vital signs are normal. She appears well-developed and well-nourished. She is cooperative. Non-toxic appearance. She does not appear ill. No distress.  HENT:  Head: Normocephalic.  Right Ear: Hearing, tympanic membrane, external ear and ear canal normal.  Left Ear: Hearing, tympanic membrane, external ear and ear canal normal.  Nose: Nose normal.  Eyes: Conjunctivae, EOM and lids are normal. Pupils are equal, round, and reactive to light. No foreign bodies found.  Neck: Trachea normal and normal range of motion. Neck supple. Carotid bruit is not present. No mass and no thyromegaly present.  Cardiovascular: Normal rate, S1 normal, S2 normal and intact distal pulses. An irregular rhythm present. Exam reveals distant heart sounds. Exam reveals no gallop.  No murmur heard. Slight B peripheral edema  Pulmonary/Chest: Effort normal and breath sounds normal. No respiratory distress. She has no wheezes. She has no rhonchi. She has no rales.  Abdominal: Soft. Normal appearance and bowel sounds are normal. She exhibits no distension, no fluid wave, no abdominal bruit and no mass. There is no hepatosplenomegaly. There is no tenderness. There is no rebound, no guarding and no CVA tenderness. No hernia.  Lymphadenopathy:  She has no cervical adenopathy.  She has no axillary adenopathy.  Neurological: She is alert. She has normal strength. No cranial nerve deficit or sensory deficit.  Skin: Skin is warm, dry and intact.  Psychiatric: Her speech is normal and behavior is normal. Judgment normal. Her mood appears not anxious. Cognition and memory are normal. She does not exhibit a  depressed mood.   Diabetic foot exam:  Normal inspection  No skin breakdown  No  calluses  Normal DP pulses  Normal sensation to light touch and monofilament  Nails thickened

## 2014-03-06 NOTE — Patient Instructions (Addendum)
Stop at lab on way out. Given cough more time to resolve. Use cough meds as needed.

## 2014-03-08 ENCOUNTER — Telehealth: Payer: Self-pay

## 2014-03-08 NOTE — Telephone Encounter (Signed)
[name] notified of lab results.  Advised Dr. Diona Browner has not reviewed her labs yet and I would call her back tomorrow if Dr. Diona Browner had any further recommendations.  Copy of labs also mailed to patient.

## 2014-03-08 NOTE — Telephone Encounter (Signed)
Pt left v/m; pt had missed call from [facility] and pt request cb about lab results.

## 2014-03-09 ENCOUNTER — Encounter: Payer: Self-pay | Admitting: *Deleted

## 2014-03-12 ENCOUNTER — Other Ambulatory Visit: Payer: Commercial Managed Care - HMO

## 2014-03-12 ENCOUNTER — Other Ambulatory Visit: Payer: Self-pay | Admitting: Family Medicine

## 2014-03-12 ENCOUNTER — Ambulatory Visit: Payer: Commercial Managed Care - HMO

## 2014-03-12 DIAGNOSIS — I4891 Unspecified atrial fibrillation: Secondary | ICD-10-CM

## 2014-03-13 ENCOUNTER — Telehealth: Payer: Self-pay | Admitting: Family Medicine

## 2014-03-13 ENCOUNTER — Other Ambulatory Visit: Payer: Self-pay

## 2014-03-13 NOTE — Telephone Encounter (Signed)
Ms. Aaberg notified okay to use Imodium for diarrhea. Push Fluids and follow up if not improving.  Patient states understanding.

## 2014-03-13 NOTE — Telephone Encounter (Signed)
Can use immodium as needed but if diarrhea not improving make appt for further evaluation.

## 2014-03-13 NOTE — Telephone Encounter (Signed)
Pt forgot to discuss with Dr Diona Browner at 03/06/14 office visit that pt has been out of furosemide and potassium since 2013. Pt does not have problem with swelling or SOB. Pt wants to know if should have furosemide and K refilled. Pt request cb. [place]

## 2014-03-13 NOTE — Telephone Encounter (Signed)
Angela Ali notified that if she does not need to be on Lasix or Furosemide if she is not having issues with swelling or SOB.  Patient then ask what she can take for the diarrhea she is having which started last night.  I advised I would have to send message back to Dr. Diona Browner to see if she is okay with Angela Ali taking Imodium.  Please advise.

## 2014-03-13 NOTE — Telephone Encounter (Signed)
Patient Information:  Caller Name: Jinx  Phone: [phone]  Patient: Kelcey, Korus  Gender: Female  DOB: Sep 16, 1928  Age: 80 Years  PCP: Eliezer Lofts [facility])  Office Follow Up:  Does the office need to follow up with this patient?: Yes  Instructions For The Office: Please review, contact patient at [phone].  RN Note:  Declines appointment until she can speak with Dr Rometta Emery nurse and get answe4rs to prior questions.  Symptoms  Reason For Call & Symptoms: Has had diarrhea since Sunday `11/15.  Feels gripping oin abdfomen then has diarrhea - happening multiple times per day.  Stools watery, has had about 6-7 stools in the last 24 hours.  Sometimes has to go immediately again after stool.   Has called office and is expecting call back about medicines she can take.  Reviewed Health History In EMR: Yes  Reviewed Medications In EMR: Yes  Reviewed Allergies In EMR: Yes  Reviewed Surgeries / Procedures: Yes  Date of Onset of Symptoms: 03/11/2014  Guideline(s) Used:  Diarrhea  Disposition Per Guideline:   Go to Office Now  Reason For Disposition Reached:   Age > 60 years and has had > 6 diarrhea stools in past 24 hours  Advice Given:  N/A  Patient Refused Recommendation:  Patient Will Follow Up With Office Later  Wanting answers to prior quesrtions before making appointment.

## 2014-03-13 NOTE — Telephone Encounter (Signed)
Left message for Angela Ali that it is okay for her to use OTC Imodium for diarrhea but advised if not improving with the imodium then she will need to make an appointment for further evaluation.

## 2014-03-13 NOTE — Telephone Encounter (Signed)
Can use immodium for symptoms. Push fluids. Follow up if not improving.

## 2014-03-16 ENCOUNTER — Ambulatory Visit (INDEPENDENT_AMBULATORY_CARE_PROVIDER_SITE_OTHER): Payer: Commercial Managed Care - HMO | Admitting: Cardiovascular Disease

## 2014-03-16 ENCOUNTER — Encounter: Payer: Self-pay | Admitting: Cardiovascular Disease

## 2014-03-16 VITALS — BP 122/70 | HR 74 | Ht 64.5 in | Wt 184.2 lb

## 2014-03-16 DIAGNOSIS — E785 Hyperlipidemia, unspecified: Secondary | ICD-10-CM

## 2014-03-16 DIAGNOSIS — E118 Type 2 diabetes mellitus with unspecified complications: Secondary | ICD-10-CM

## 2014-03-16 DIAGNOSIS — I10 Essential (primary) hypertension: Secondary | ICD-10-CM

## 2014-03-16 DIAGNOSIS — I5032 Chronic diastolic (congestive) heart failure: Secondary | ICD-10-CM

## 2014-03-16 DIAGNOSIS — I4891 Unspecified atrial fibrillation: Secondary | ICD-10-CM

## 2014-03-16 DIAGNOSIS — J189 Pneumonia, unspecified organism: Secondary | ICD-10-CM

## 2014-03-16 DIAGNOSIS — R609 Edema, unspecified: Secondary | ICD-10-CM

## 2014-03-16 MED ORDER — CLONIDINE HCL 0.1 MG PO TABS: 0.0500 mg | ORAL_TABLET | Freq: Two times a day (BID) | ORAL | Status: DC

## 2014-03-16 NOTE — Assessment & Plan Note (Signed)
She appears relatively euvolemic on today's visit. She's not taking much Lasix at baseline weight has been stable

## 2014-03-16 NOTE — Assessment & Plan Note (Signed)
Blood pressure well controlled. We will change her clonidine pill down to a 0.1 mg twice a day and instruct her to take one half pill twice a day. Currently she is taking one quarter pill of a 0.2 mg pill Otherwise no other medication changes

## 2014-03-16 NOTE — Assessment & Plan Note (Signed)
Minimal edema on today's visit. Would avoid calcium channel blockers. Recommended Lasix for any worsening ankle swelling

## 2014-03-16 NOTE — Assessment & Plan Note (Signed)
We have encouraged continued exercise, careful diet management in an effort to lose weight. 

## 2014-03-16 NOTE — Patient Instructions (Signed)
You are doing well. No medication changes were made.  Please call us if you have new issues that need to be addressed before your next appt.  Your physician wants you to follow-up in: 6 months.  You will receive a reminder letter in the mail two months in advance. If you don't receive a letter, please call our office to schedule the follow-up appointment.   

## 2014-03-16 NOTE — Assessment & Plan Note (Signed)
Heart rate well-controlled. Tolerating anticoagulation. Monitored by primary care We'll continue metoprolol for rate control She's not needing diltiazem for tachycardia

## 2014-03-16 NOTE — Progress Notes (Signed)
Patient ID: Angela Ali, female    DOB: Dec 19, 1928, 80 y.o.   MRN: [id number]  HPI Comments: Angela Ali is a very pleasant 80 year old woman with a history of chronic atrial fibrillation on warfarin, hypertension, chronic lower extremity edema, shortness of breath, who presents for routine followup. History of fall with hematoma of the right lower extremity,  required care from the wound clinic for a large ulcerative wound on the right lateral aspect of her leg. This has healed well. History of medication confusion, some noncompliance. She presents for follow-up today of her atrial fibrillation  In general, she reports that she feels well. She is getting over recent bronchitis or pneumonia. She required antibiotics. Minimal cough on today's visit. Sleeping well She reports having a sharp pain sometimes in her left neck that comes on at rest. Better with massage and motion. She reports having minimal leg edema, overall stable. Denies having any significant chest pain with exertion. She does have chronic shortness of breath with exertion, legs are weaker, walks slowly. Otherwise no other complaints  EKG on today's visit shows atrial fibrillation with rate 74 bpm, PVCs noted, nonspecific ST abnormality in the anterolateral leads  Denies any recent falls.  She wears compression hose bilaterally.  Calcium channel blocker/diltiazem was held in the past secondary to edema.  She has not been taking much Lasix Reports that she cuts her clonidine 0.2 mg pill  into one quarter pill twice a day as a half pill or whole pill made her lightheaded   Outpatient Encounter Prescriptions as of 03/16/2014  Medication Sig  . ACCU-CHEK FASTCLIX LANCETS MISC 1 strip by Does not apply route 2 (two) times daily as needed. dx 250.90  . acetaminophen (TYLENOL ARTHRITIS PAIN) 650 MG CR tablet Take 650 mg by mouth daily.  . Ascorbic Acid (VITAMIN C PO) Take 1 tablet by mouth daily.  Marland Kitchen atorvastatin (LIPITOR) 80 MG tablet  Take 1 tablet (80 mg total) by mouth daily.  . benzonatate (TESSALON) 100 MG capsule TAKE 2 TABLETS BY MOUTH TWICE DAILY AS NEEDED FOR COUGH  . Blood Glucose Monitoring Suppl (ACCU-CHEK NANO SMARTVIEW) W/DEVICE KIT 1 Device by Does not apply route 2 (two) times daily. Use device to check blood sugar dx 250.90  . Cholecalciferol (VITAMIN D-3 PO) Take 1 tablet by mouth daily.  . cloNIDine (CATAPRES) 0.2 MG tablet Take 0.1 mg by mouth 2 (two) times daily.  . cyclobenzaprine (FLEXERIL) 5 MG tablet Take 1-2 tablets (5-10 mg total) by mouth at bedtime.  . digoxin (LANOXIN) 0.125 MG tablet Take 1 tablet (0.125 mg total) by mouth daily.  Marland Kitchen diltiazem (CARDIZEM) 30 MG tablet Take 1 tablet (30 mg total) by mouth 4 (four) times daily as needed. For fast heart beat  . fluticasone (FLONASE) 50 MCG/ACT nasal spray Place 2 sprays into the nose daily.  Marland Kitchen glucose blood test strip Use as instructed to check blood sugar 2 (two) times daily dx 250.90  . guaiFENesin-codeine (ROBITUSSIN AC) 100-10 MG/5ML syrup Take 5-10 mLs by mouth at bedtime as needed for cough.  . hydrALAZINE (APRESOLINE) 50 MG tablet Take 1 tablet (50 mg total) by mouth 3 (three) times daily.  . hydrochlorothiazide (HYDRODIURIL) 25 MG tablet Take 1 tablet (25 mg total) by mouth daily.  Marland Kitchen losartan (COZAAR) 100 MG tablet Take 1 tablet (100 mg total) by mouth daily.  . metoprolol succinate (TOPROL-XL) 100 MG 24 hr tablet Take 1 tablet (100 mg total) by mouth daily.  Marland Kitchen omeprazole (  PRILOSEC) 20 MG capsule Take 1 capsule (20 mg total) by mouth 2 (two) times daily before a meal.  . warfarin (COUMADIN) 2.5 MG tablet Take 2.5 mg by mouth See admin instructions. Take 2.5 mg on Tuesday, Thursday, and Saturdays (alternating with 44m on other days)  . warfarin (COUMADIN) 5 MG tablet Take 1 tablet (5 mg total) by mouth See admin instructions. Take 5 mg on Mon, Wed, Fri, Sunday (2.5 mg on other days)  . [DISCONTINUED] diclofenac sodium (VOLTAREN) 1 % GEL Apply 4 g  topically 4 (four) times daily as needed (pain in left knee).  . [DISCONTINUED] furosemide (LASIX) 80 MG tablet Take 1 tablet (80 mg total) by mouth daily as needed. For excessive fluid  . [DISCONTINUED] Na Sulfate-K Sulfate-Mg Sulf (SUPREP BOWEL PREP) SOLN Take 1 kit by mouth once. suprep as directed. No substitutions  . [DISCONTINUED] potassium chloride SA (K-DUR,KLOR-CON) 20 MEQ tablet Take 20 mEq by mouth daily as needed.   Social history  reports that she has never smoked. She has never used smokeless tobacco. She reports that she does not drink alcohol or use illicit drugs.  Review of Systems  Constitutional: Negative.   Respiratory: Negative.   Cardiovascular: Negative.   Musculoskeletal: Positive for back pain and gait problem.  Skin: Negative.   Neurological: Negative.   Hematological: Negative.   All other systems reviewed and are negative.  BP 122/70 mmHg  Pulse 74  Ht 5' 4.5" (1.638 m)  Wt 184 lb 4 oz (83.575 kg)  BMI 31.15 kg/m2  Physical Exam  Constitutional: She is oriented to person, place, and time. She appears well-developed and well-nourished.  HENT:  Head: Normocephalic.  Nose: Nose normal.  Mouth/Throat: Oropharynx is clear and moist.  Eyes: Conjunctivae are normal. Pupils are equal, round, and reactive to light.  Neck: Normal range of motion. Neck supple. No JVD present.  Cardiovascular: Normal rate, S1 normal, S2 normal and intact distal pulses.  An irregularly irregular rhythm present. Exam reveals no gallop and no friction rub.   Murmur heard.  Systolic murmur is present with a grade of 2/6  Trace edema above the socks  Pulmonary/Chest: Effort normal and breath sounds normal. No respiratory distress. She has no wheezes. She has no rales. She exhibits no tenderness.  Abdominal: Soft. Bowel sounds are normal. She exhibits no distension. There is no tenderness.  Musculoskeletal: Normal range of motion. She exhibits no edema or tenderness.  Lymphadenopathy:     She has no cervical adenopathy.  Neurological: She is alert and oriented to person, place, and time. Coordination normal.  Skin: Skin is warm and dry. No rash noted. No erythema.  Psychiatric: She has a normal mood and affect. Her behavior is normal. Judgment and thought content normal.    Assessment and Plan  Nursing note and vitals reviewed.

## 2014-03-16 NOTE — Assessment & Plan Note (Signed)
Encouraged her to stay on her Lipitor. No known coronary artery disease

## 2014-03-16 NOTE — Assessment & Plan Note (Signed)
Dramatic improvement on today's visit. Symptoms essentially resolved. Did well with antibiotics

## 2014-03-21 ENCOUNTER — Telehealth: Payer: Self-pay | Admitting: Family Medicine

## 2014-03-21 NOTE — Telephone Encounter (Signed)
Patient Information:  Caller Name: Angela Angela Ali Angela Ali  Phone: [phone]  Patient: Angela Angela Ali, Angela Ali  Gender: Female  DOB: 1928/04/28  Age: 80 Years  PCP: Eliezer Lofts (Family Practice)  Office Follow Up:  Does the office need to follow up with this patient?: Yes  Instructions For The Office: Requesting to be seen today for continuous cough  RN Note:  Patient calling with c/o continuous coughing.  No relief with Rx cough med's and home remedies.  Requesting to be seen today.  Symptoms  Reason For Call & Symptoms: follow up cough  Reviewed Health History In EMR: Yes  Reviewed Medications In EMR: Yes  Reviewed Allergies In EMR: Yes  Reviewed Surgeries / Procedures: Yes  Date of Onset of Symptoms: 03/09/2014  Guideline(s) Used:  Cough  Disposition Per Guideline:   See Today or Tomorrow in Office  Reason For Disposition Reached:   Continuous (nonstop) coughing interferes with work or school and no improvement using cough treatment per Care Advice  Advice Given:  Call Back If:  Difficulty breathing  Cough lasts more than 3 weeks  You become worse.  Patient Will Follow Care Advice:  YES

## 2014-03-21 NOTE — Telephone Encounter (Signed)
Spoke with [name].  Offered appointment for Friday or if needed to be seen today will need to go to an Urgent Care due to no available appointments.  [name] request her and Angela Ali be scheduled for Friday.  Appointment scheduled with Dr. Lorelei Pont 03/23/2014 at 10:00 am. Advised if her cough gets worse where she is experiencing SOB she will need to go to ER or UC before Friday.  [name] states understanding

## 2014-03-21 NOTE — Telephone Encounter (Signed)
See if we can see on Friday - we are overloaded today

## 2014-03-23 ENCOUNTER — Encounter: Payer: Self-pay | Admitting: Family Medicine

## 2014-03-23 ENCOUNTER — Ambulatory Visit (INDEPENDENT_AMBULATORY_CARE_PROVIDER_SITE_OTHER): Payer: Commercial Managed Care - HMO | Admitting: Family Medicine

## 2014-03-23 ENCOUNTER — Telehealth: Payer: Self-pay | Admitting: *Deleted

## 2014-03-23 VITALS — BP 122/80 | HR 59 | Temp 98.6°F | Ht 63.0 in | Wt 187.2 lb

## 2014-03-23 DIAGNOSIS — I4891 Unspecified atrial fibrillation: Secondary | ICD-10-CM

## 2014-03-23 DIAGNOSIS — I5022 Chronic systolic (congestive) heart failure: Secondary | ICD-10-CM

## 2014-03-23 DIAGNOSIS — J208 Acute bronchitis due to other specified organisms: Secondary | ICD-10-CM

## 2014-03-23 DIAGNOSIS — J4520 Mild intermittent asthma, uncomplicated: Secondary | ICD-10-CM

## 2014-03-23 MED ORDER — AZITHROMYCIN 250 MG PO TABS: ORAL_TABLET | ORAL | Status: DC

## 2014-03-23 MED ORDER — DOXYCYCLINE HYCLATE 100 MG PO TABS: 100.0000 mg | ORAL_TABLET | Freq: Two times a day (BID) | ORAL | Status: DC

## 2014-03-23 NOTE — Telephone Encounter (Signed)
Pt is at the pharmacy stating that the abx you prescribed it too expensive it's $25.00 for her and her daughter and she can't afford $50.00 right now. Please advise

## 2014-03-23 NOTE — Progress Notes (Signed)
Pre visit review using our clinic review tool, if applicable. No additional management support is needed unless otherwise documented below in the visit note. 

## 2014-03-23 NOTE — Addendum Note (Signed)
Addended by: Tammi Sou on: 03/23/2014 12:06 PM   Modules accepted: Orders

## 2014-03-23 NOTE — Telephone Encounter (Signed)
As previous

## 2014-03-23 NOTE — Progress Notes (Signed)
Dr. Karleen Hampshire T. Rylen Hou, MD, CAQ [facility] [address] [place], [address] Phone: [phone] Fax: [phone]  03/23/2014  Patient: Angela Ali, MRN: [id number], DOB: Sep 01, 1928, 79 y.o.  Primary Physician:  Kerby Nora, MD  Chief Complaint: Cough and Nasal Congestion  Subjective:   Angela Ali is a 80 y.o. very pleasant female patient who presents with the following:  Pleasant 80 year old patient who I remember well who has a history of congestive heart failure as well as a history of asthma, and she is also on chronic Coumadin for atrial fibrillation.  She presents today with a weeklong history of worsening cough that is productive of sputum.  She is sleeping very poorly at nighttime, and his coughing much of the time during the day.  She is afebrile, but she has been having some decreased by mouth intake throughout this time.  Her daughter is also sick currently.  Past Medical History, Surgical History, Social History, Family History, Problem List, Medications, and Allergies have been reviewed and updated if relevant.  ROS: GEN: Acute illness details above GI: Tolerating PO intake GU: maintaining adequate hydration and urination Pulm: No SOB Interactive and getting along well at home.  Otherwise, ROS is as per the HPI.   Objective:   BP 122/80 mmHg  Pulse 59  Temp(Src) 98.6 F (37 C) (Oral)  Ht 5\' 3"  (1.6 m)  Wt 187 lb 4 oz (84.936 kg)  BMI 33.18 kg/m2  SpO2 98%   GEN: A and O x 3. WDWN. NAD.    ENT: Nose clear, ext NML.  No LAD.  No JVD.  TM's clear. Oropharynx clear.  PULM: Normal WOB, no distress. No crackles, wheezes, rare rhonchi CV: RRR, no M/G/R, No rubs, No JVD.   EXT: warm and well-perfused, No c/c/e. PSYCH: Pleasant and conversant.    Laboratory and Imaging Data:  Assessment and Plan:   Acute bronchitis due to other specified organisms  Asthma, mild intermittent, uncomplicated  Atrial  fibrillation, unspecified  Chronic systolic congestive heart failure  Given age and risk, treated with antibiotics in this case. For cost purposes, I had to change from my initial prescription of doxycycline to Zithromax.   Signed,  Elpidio Galea. Otha Rickles, MD   Patient's Medications  New Prescriptions   No medications on file  Previous Medications   ACCU-CHEK FASTCLIX LANCETS MISC    1 strip by Does not apply route 2 (two) times daily as needed. dx 250.90   ACETAMINOPHEN (TYLENOL ARTHRITIS PAIN) 650 MG CR TABLET    Take 650 mg by mouth daily.   ASCORBIC ACID (VITAMIN C PO)    Take 1 tablet by mouth daily.   ATORVASTATIN (LIPITOR) 80 MG TABLET    Take 1 tablet (80 mg total) by mouth daily.   BENZONATATE (TESSALON) 100 MG CAPSULE    TAKE 2 TABLETS BY MOUTH TWICE DAILY AS NEEDED FOR COUGH   BLOOD GLUCOSE MONITORING SUPPL (ACCU-CHEK NANO SMARTVIEW) W/DEVICE KIT    1 Device by Does not apply route 2 (two) times daily. Use device to check blood sugar dx 250.90   CHOLECALCIFEROL (VITAMIN D-3 PO)    Take 1 tablet by mouth daily.   CLONIDINE (CATAPRES) 0.1 MG TABLET    Take 0.5 tablets (0.05 mg total) by mouth 2 (two) times daily.   CYCLOBENZAPRINE (FLEXERIL) 5 MG TABLET    Take 1-2 tablets (5-10 mg total) by mouth at bedtime.   DIGOXIN (LANOXIN) 0.125  MG TABLET    Take 1 tablet (0.125 mg total) by mouth daily.   DILTIAZEM (CARDIZEM) 30 MG TABLET    Take 1 tablet (30 mg total) by mouth 4 (four) times daily as needed. For fast heart beat   FLUTICASONE (FLONASE) 50 MCG/ACT NASAL SPRAY    Place 2 sprays into the nose daily.   GLUCOSE BLOOD TEST STRIP    Use as instructed to check blood sugar 2 (two) times daily dx 250.90   GUAIFENESIN-CODEINE (ROBITUSSIN AC) 100-10 MG/5ML SYRUP    Take 5-10 mLs by mouth at bedtime as needed for cough.   HYDRALAZINE (APRESOLINE) 50 MG TABLET    Take 1 tablet (50 mg total) by mouth 3 (three) times daily.   HYDROCHLOROTHIAZIDE (HYDRODIURIL) 25 MG TABLET    Take 1 tablet  (25 mg total) by mouth daily.   LOSARTAN (COZAAR) 100 MG TABLET    Take 1 tablet (100 mg total) by mouth daily.   METOPROLOL SUCCINATE (TOPROL-XL) 100 MG 24 HR TABLET    Take 1 tablet (100 mg total) by mouth daily.   OMEPRAZOLE (PRILOSEC) 20 MG CAPSULE    Take 1 capsule (20 mg total) by mouth 2 (two) times daily before a meal.   WARFARIN (COUMADIN) 2.5 MG TABLET    Take 2.5 mg by mouth See admin instructions. Take 2.5 mg on Tuesday, Thursday, and Saturdays (alternating with 5mg  on other days)   WARFARIN (COUMADIN) 5 MG TABLET    Take 1 tablet (5 mg total) by mouth See admin instructions. Take 5 mg on Mon, Wed, Fri, Sunday (2.5 mg on other days)  Modified Medications   Modified Medication Previous Medication   AZITHROMYCIN (ZITHROMAX) 250 MG TABLET azithromycin (ZITHROMAX) 250 MG tablet      Take 2 tabs po on day 1, then 1 tab po for 4 days    Take 2 tabs po on day 1, then 1 tab po for 4 days   Discontinued Medications   No medications on file

## 2014-03-23 NOTE — Telephone Encounter (Signed)
Done and pt notified. 

## 2014-03-26 ENCOUNTER — Ambulatory Visit: Payer: Commercial Managed Care - HMO

## 2014-03-26 ENCOUNTER — Other Ambulatory Visit: Payer: Self-pay | Admitting: Family Medicine

## 2014-03-26 ENCOUNTER — Other Ambulatory Visit: Payer: Commercial Managed Care - HMO

## 2014-03-29 ENCOUNTER — Other Ambulatory Visit (INDEPENDENT_AMBULATORY_CARE_PROVIDER_SITE_OTHER): Payer: Commercial Managed Care - HMO

## 2014-03-29 DIAGNOSIS — Z7902 Long term (current) use of antithrombotics/antiplatelets: Secondary | ICD-10-CM

## 2014-03-29 DIAGNOSIS — Z452 Encounter for adjustment and management of vascular access device: Secondary | ICD-10-CM

## 2014-03-29 DIAGNOSIS — I4891 Unspecified atrial fibrillation: Secondary | ICD-10-CM

## 2014-03-29 LAB — PROTIME-INR
INR: 2.6 ratio — AB (ref 0.8–1.0)
Prothrombin Time: 27.7 s — ABNORMAL HIGH (ref 9.6–13.1)

## 2014-04-02 ENCOUNTER — Telehealth: Payer: Self-pay | Admitting: *Deleted

## 2014-04-02 NOTE — Telephone Encounter (Signed)
Called Angela Ali to give PT/INR results from 03/29/2014.  While on the phone, Angela Ali states that she is still coughing.  She was seen by Dr. Lorelei Pont on 03/23/2014 and was prescribed an antibiotic. She has completed that but still has the cough with clear phelgm.  Looks like Dr. Lorelei Pont originally prescribed doxycycline but had to change it Azithromycin due to cost.  She states the cough syrup Robitussin AC is not helping her cough either.  Advised I would sent Dr. Diona Browner a note to see if she had any further recommendation.

## 2014-04-02 NOTE — Telephone Encounter (Signed)
i would not do anything further. Cont supportive care - unless worse or fever. Anticipate cough to continue.

## 2014-04-03 NOTE — Telephone Encounter (Signed)
Ms. Koerber notified as instructed by telephone.  She wants to know if it is okay to take OTC Mucinex or Coricidin?  Please Advise.

## 2014-04-03 NOTE — Telephone Encounter (Signed)
Left message for Ms.Diers that she can use either Mucinex or Coricidin per Dr. Diona Browner.

## 2014-04-03 NOTE — Telephone Encounter (Signed)
Can use either

## 2014-04-03 NOTE — Telephone Encounter (Signed)
Will take time to improve. Agree with giving it more time. If she is still not improiving or new fever or SOB have her make appt to be seen.

## 2014-04-30 ENCOUNTER — Ambulatory Visit (INDEPENDENT_AMBULATORY_CARE_PROVIDER_SITE_OTHER): Payer: Commercial Managed Care - HMO | Admitting: Family Medicine

## 2014-04-30 DIAGNOSIS — Z5181 Encounter for therapeutic drug level monitoring: Secondary | ICD-10-CM

## 2014-04-30 LAB — POCT INR: INR: 1.4

## 2014-05-04 ENCOUNTER — Other Ambulatory Visit: Payer: Self-pay | Admitting: Family Medicine

## 2014-05-04 ENCOUNTER — Other Ambulatory Visit: Payer: Self-pay | Admitting: Cardiovascular Disease

## 2014-05-07 ENCOUNTER — Telehealth: Payer: Self-pay

## 2014-05-07 NOTE — Telephone Encounter (Signed)
Spoke w/ pt.  She wanted to clarify that she is taking her clonidine correctly by cutting it in half.  Pt reports that she has been doing so, but has not been using a pill cutter.  Advised pt to obtain this and call back w/ any further questions or concerns.

## 2014-05-07 NOTE — Telephone Encounter (Signed)
Pt has a questions regarding her Clonidine. Please call.

## 2014-05-09 ENCOUNTER — Other Ambulatory Visit: Payer: Self-pay | Admitting: Family Medicine

## 2014-05-09 NOTE — Telephone Encounter (Signed)
Last office visit 03/23/2014 with Dr. Lorelei Pont.  Last refilled 02/16/2014 for 180 ml with no refills.  Ok to refill?

## 2014-05-10 NOTE — Telephone Encounter (Signed)
Rx faxed to [facility] [phone].

## 2014-05-14 ENCOUNTER — Other Ambulatory Visit (INDEPENDENT_AMBULATORY_CARE_PROVIDER_SITE_OTHER): Payer: Commercial Managed Care - HMO

## 2014-05-14 DIAGNOSIS — Z452 Encounter for adjustment and management of vascular access device: Secondary | ICD-10-CM

## 2014-05-14 DIAGNOSIS — Z7902 Long term (current) use of antithrombotics/antiplatelets: Secondary | ICD-10-CM

## 2014-05-14 DIAGNOSIS — I4891 Unspecified atrial fibrillation: Secondary | ICD-10-CM

## 2014-05-14 DIAGNOSIS — Z5181 Encounter for therapeutic drug level monitoring: Secondary | ICD-10-CM

## 2014-05-14 LAB — POCT INR: INR: 2.7

## 2014-06-04 ENCOUNTER — Telehealth: Payer: Self-pay

## 2014-06-04 NOTE — Telephone Encounter (Signed)
Pt said she has not take furosemide or Potassium since 2014. Pt said she stopped because she was taking so much medicine. Pt said she does not urinate a lot and pt has begun to swell in ankles; pt wants to know if furosemide and Potassium could be restarted to [place]. Last saw Dr Diona Browner 03/06/2014.Please advise.

## 2014-06-05 MED ORDER — POTASSIUM CHLORIDE CRYS ER 20 MEQ PO TBCR: 20.0000 meq | EXTENDED_RELEASE_TABLET | Freq: Every day | ORAL | Status: DC

## 2014-06-05 MED ORDER — FUROSEMIDE 20 MG PO TABS: 20.0000 mg | ORAL_TABLET | Freq: Every day | ORAL | Status: DC

## 2014-06-05 NOTE — Telephone Encounter (Signed)
f swelling not improving in next week on this dose, have pt make appt for eval. Go to ER if shortness of breath.

## 2014-06-05 NOTE — Telephone Encounter (Addendum)
Ms. Eskridge notified as instructed by telephone.  She states her left leg really hurts and nothing she tries seems to help.  She states it can take up to 7 days before she receives her medication from the pharmacy. Offered to send in a few tablets to a local pharmacy so she could go ahead and get started on the Lasix but she wants to weight due to cost at a local pharmacy.  Patient would like an appointment to see Dr. Diona Browner.  Appointment scheduled 06/07/2014 at 8:15 am.

## 2014-06-06 LAB — COLOGUARD: COLOGUARD: NEGATIVE

## 2014-06-07 ENCOUNTER — Encounter: Payer: Self-pay | Admitting: Family Medicine

## 2014-06-07 ENCOUNTER — Ambulatory Visit (INDEPENDENT_AMBULATORY_CARE_PROVIDER_SITE_OTHER): Payer: Commercial Managed Care - HMO | Admitting: Family Medicine

## 2014-06-07 DIAGNOSIS — Z452 Encounter for adjustment and management of vascular access device: Secondary | ICD-10-CM

## 2014-06-07 DIAGNOSIS — I5022 Chronic systolic (congestive) heart failure: Secondary | ICD-10-CM

## 2014-06-07 DIAGNOSIS — I4891 Unspecified atrial fibrillation: Secondary | ICD-10-CM

## 2014-06-07 DIAGNOSIS — M5416 Radiculopathy, lumbar region: Secondary | ICD-10-CM

## 2014-06-07 DIAGNOSIS — Z7902 Long term (current) use of antithrombotics/antiplatelets: Secondary | ICD-10-CM

## 2014-06-07 LAB — POCT INR: INR: 4.9

## 2014-06-07 MED ORDER — PREDNISONE 20 MG PO TABS: ORAL_TABLET | ORAL | Status: DC

## 2014-06-07 NOTE — Patient Instructions (Addendum)
Can lasix /KCL as needed for swelling. Hold today coumadin dose then return to normal schedule and return for INR in 1 week. Start prednisone taper x 6 days.  Call if blood sugar > 350. Can also use tylenol for breakthrough pain. Heat, gentle stretching, call if not improving as expected.

## 2014-06-07 NOTE — Progress Notes (Signed)
Subjective:    Patient ID: Angela Ali, female    DOB: 1929-03-02, 81 y.o.   MRN: [id number]  HPI  81 year old female with history of CHF, cardiomyopathy, HTN,DM well controlled, Afib on chronic anticoagulation with coumadin, chronic knee pain, hx of herniated disc in lumbar spine presents with left leg pain.  She reports she has been off her 80 mg of lasix in last 6 mnths and has noted mild increase in swelling in both legs.. Now improved on its own.  We did send in lasix 20 mg daily for her to use as needed.  She has been having pain in  left  Low back, buttock , radiates down left to upper thigh.  Pain is worse with  Movement.  Constant pain with sharp pain when she trys to sit/streth up. No numbness in foot, no weakness. No current swelling in left leg, no redness.  No recent injury or fall, no change in activity. Using tyelnol arthritis, heat, cyclobenzaprine.. Does not help.  INR today on coumadin 4.9, no change in diet or medication dose, no recent antibiotics.  No bleeding.She is taking 5 mg daily except 2.5 mg on Wed and Sat. Last check 2.7  On 1/18. Review of Systems  Constitutional: Negative for fever and fatigue.  HENT: Negative for ear pain.   Eyes: Negative for pain.  Respiratory: Negative for chest tightness and shortness of breath.   Cardiovascular: Negative for chest pain, palpitations and leg swelling.  Gastrointestinal: Negative for abdominal pain.  Genitourinary: Negative for dysuria.       Objective:   Physical Exam  Constitutional: Vital signs are normal. She appears well-developed and well-nourished. She is cooperative.  Non-toxic appearance. She does not appear ill. No distress.  HENT:  Head: Normocephalic.  Right Ear: Hearing, tympanic membrane, external ear and ear canal normal. Tympanic membrane is not erythematous, not retracted and not bulging.  Left Ear: Hearing, tympanic membrane, external ear and ear canal normal. Tympanic membrane is not  erythematous, not retracted and not bulging.  Nose: No mucosal edema or rhinorrhea. Right sinus exhibits no maxillary sinus tenderness and no frontal sinus tenderness. Left sinus exhibits no maxillary sinus tenderness and no frontal sinus tenderness.  Mouth/Throat: Uvula is midline, oropharynx is clear and moist and mucous membranes are normal.  Eyes: Conjunctivae, EOM and lids are normal. Pupils are equal, round, and reactive to light. Lids are everted and swept, no foreign bodies found.  Neck: Trachea normal and normal range of motion. Neck supple. Carotid bruit is not present. No thyroid mass and no thyromegaly present.  Cardiovascular: Normal rate, regular rhythm, S1 normal, S2 normal, normal heart sounds, intact distal pulses and normal pulses.  Exam reveals no gallop and no friction rub.   No murmur heard. No peripheral edema  Pulmonary/Chest: Effort normal and breath sounds normal. No tachypnea. No respiratory distress. She has no decreased breath sounds. She has no wheezes. She has no rhonchi. She has no rales.  Abdominal: Soft. Normal appearance and bowel sounds are normal. There is no tenderness.  Musculoskeletal:       Lumbar back: She exhibits decreased range of motion and tenderness. She exhibits no bony tenderness.  ttp in  Left sciatic notch Pos SLR, neg faber, no ttp over anterior or lateral hip  Neurological: She is alert.  Skin: Skin is warm, dry and intact. No rash noted.  Psychiatric: Her speech is normal and behavior is normal. Judgment and thought content normal. Her mood  appears not anxious. Cognition and memory are normal. She does not exhibit a depressed mood.          Assessment & Plan:

## 2014-06-07 NOTE — Assessment & Plan Note (Signed)
Treat with home stretching, short pred taper given age. No clear sign of compression fracture.If not improving consider X-ray and PT.  Info given.

## 2014-06-07 NOTE — Assessment & Plan Note (Signed)
Supratheraputic. Hold coumadin today and restart regular dosing as pt has been really stable until now. No changes recently. Recheck in 1 week.

## 2014-06-07 NOTE — Progress Notes (Signed)
Pre visit review using our clinic review tool, if applicable. No additional management support is needed unless otherwise documented below in the visit note. 

## 2014-06-07 NOTE — Assessment & Plan Note (Signed)
Currently euvolemic. Will prescribe 20 mg lasix and KCL for pt to have available prn.

## 2014-06-11 ENCOUNTER — Other Ambulatory Visit: Payer: Commercial Managed Care - HMO

## 2014-06-14 ENCOUNTER — Telehealth: Payer: Self-pay | Admitting: Family Medicine

## 2014-06-14 ENCOUNTER — Other Ambulatory Visit (INDEPENDENT_AMBULATORY_CARE_PROVIDER_SITE_OTHER): Payer: Commercial Managed Care - HMO

## 2014-06-14 DIAGNOSIS — Z452 Encounter for adjustment and management of vascular access device: Secondary | ICD-10-CM

## 2014-06-14 DIAGNOSIS — I4891 Unspecified atrial fibrillation: Secondary | ICD-10-CM

## 2014-06-14 DIAGNOSIS — Z7902 Long term (current) use of antithrombotics/antiplatelets: Secondary | ICD-10-CM

## 2014-06-14 LAB — POCT INR: INR: 3

## 2014-06-14 NOTE — Telephone Encounter (Signed)
Pt called wanting to know when she needs to come back for her pt/inr

## 2014-06-14 NOTE — Telephone Encounter (Signed)
Have her make an appt to be re-evaluated tomorrow.. Work her in tommorow

## 2014-06-14 NOTE — Telephone Encounter (Signed)
Lab appointment scheduled today at 1:30 for PT/INR.  While on the phone [name] states she is still hurting on her left side.  She states it is a cramping, burning, pulling sensation is her left knee.  It is miserable feeling. She has finished the prednisone and taking the Tylenol Arthritis but nothing is helping.  Advised I would send a note to Dr. Diona Browner for further recommendations.

## 2014-06-14 NOTE — Telephone Encounter (Signed)
Ms. Reddoch notified that Dr. Diona Browner would like to re-evaluate her knee.  Appointment scheduled for 06/15/2014 at 12:00 pm.

## 2014-06-15 ENCOUNTER — Ambulatory Visit (INDEPENDENT_AMBULATORY_CARE_PROVIDER_SITE_OTHER)
Admission: RE | Admit: 2014-06-15 | Discharge: 2014-06-15 | Disposition: A | Discharge status: Home / Self Care | Payer: Commercial Managed Care - HMO | Source: Ambulatory Visit | Attending: Family Medicine | Admitting: Family Medicine

## 2014-06-15 ENCOUNTER — Ambulatory Visit: Payer: Commercial Managed Care - HMO | Admitting: Family Medicine

## 2014-06-15 ENCOUNTER — Ambulatory Visit (INDEPENDENT_AMBULATORY_CARE_PROVIDER_SITE_OTHER): Payer: Commercial Managed Care - HMO | Admitting: Family Medicine

## 2014-06-15 ENCOUNTER — Encounter: Payer: Self-pay | Admitting: Family Medicine

## 2014-06-15 VITALS — BP 130/90 | HR 76 | Temp 98.2°F | Ht 63.0 in | Wt 187.2 lb

## 2014-06-15 DIAGNOSIS — M5416 Radiculopathy, lumbar region: Secondary | ICD-10-CM

## 2014-06-15 MED ORDER — ONDANSETRON HCL 4 MG PO TABS: 4.0000 mg | ORAL_TABLET | Freq: Three times a day (TID) | ORAL | Status: DC | PRN

## 2014-06-15 MED ORDER — PREDNISONE 20 MG PO TABS: ORAL_TABLET | ORAL | Status: DC

## 2014-06-15 MED ORDER — TRAMADOL HCL 50 MG PO TABS
50.0000 mg | ORAL_TABLET | Freq: Three times a day (TID) | ORAL | Status: DC | PRN
Start: 2014-06-15 — End: 2015-01-18

## 2014-06-15 NOTE — Progress Notes (Signed)
   Subjective:    Patient ID: DORELLA LASTER, female    DOB: 08/19/1928, 81 y.o.   MRN: [id number]  HPI  81 year old female pt with history of CHF, cardiomyopathy, HTN,DM well controlled, Afib on chronic anticoagulation with coumadin, chronic knee pain, hx of herniated disc in lumbar spine seen 1 week ago for presumed  acute lumbar radiculopathy causing left leg pain presents with continued severe pain.. No better with home PT, pred. taper.  She reports today pain in right llow back , buttock and into anterior hip. Yesterday burning in left knee. Better with walking, worse with leaning back.  Prednisone did not help at all. Tylenol and muscle relaxant not helping either.  Applying heat HasTens unit helps a little.    Review of Systems  Constitutional: Negative for fever and fatigue.  HENT: Negative for ear pain.   Eyes: Negative for pain.  Respiratory: Negative for chest tightness and shortness of breath.   Cardiovascular: Negative for chest pain, palpitations and leg swelling.  Gastrointestinal: Negative for abdominal pain.  Genitourinary: Negative for dysuria.       Objective:   Physical Exam  Constitutional: Vital signs are normal. She appears well-developed and well-nourished. She is cooperative.  Non-toxic appearance. She does not appear ill. No distress.  HENT:  Head: Normocephalic.  Right Ear: Hearing, tympanic membrane, external ear and ear canal normal. Tympanic membrane is not erythematous, not retracted and not bulging.  Left Ear: Hearing, tympanic membrane, external ear and ear canal normal. Tympanic membrane is not erythematous, not retracted and not bulging.  Nose: No mucosal edema or rhinorrhea. Right sinus exhibits no maxillary sinus tenderness and no frontal sinus tenderness. Left sinus exhibits no maxillary sinus tenderness and no frontal sinus tenderness.  Mouth/Throat: Uvula is midline, oropharynx is clear and moist and mucous membranes are normal.  Eyes:  Conjunctivae, EOM and lids are normal. Pupils are equal, round, and reactive to light. Lids are everted and swept, no foreign bodies found.  Neck: Trachea normal and normal range of motion. Neck supple. Carotid bruit is not present. No thyroid mass and no thyromegaly present.  Cardiovascular: Normal rate, regular rhythm, S1 normal, S2 normal, normal heart sounds, intact distal pulses and normal pulses.  Exam reveals no gallop and no friction rub.   No murmur heard. No peripheral edema  Pulmonary/Chest: Effort normal and breath sounds normal. No tachypnea. No respiratory distress. She has no decreased breath sounds. She has no wheezes. She has no rhonchi. She has no rales.  Abdominal: Soft. Normal appearance and bowel sounds are normal. There is no tenderness.  Musculoskeletal:       Lumbar back: She exhibits decreased range of motion and tenderness. She exhibits no bony tenderness.  ttp in  Left sciatic notch Pos SLR, neg faber, no ttp over anterior or lateral hip  Neurological: She is alert.  Skin: Skin is warm, dry and intact. No rash noted.  Psychiatric: Her speech is normal and behavior is normal. Judgment and thought content normal. Her mood appears not anxious. Cognition and memory are normal. She does not exhibit a depressed mood.          Assessment & Plan:

## 2014-06-15 NOTE — Progress Notes (Signed)
Pre visit review using our clinic review tool, if applicable. No additional management support is needed unless otherwise documented below in the visit note. 

## 2014-06-15 NOTE — Patient Instructions (Addendum)
Call with X-ray results. Repeat a longer course of steroid. Refer to PT. Can use Tens Units. Can use tramadol for severe pain can use zofran for nausea with it. Follow up in 2 weeks, call sooner if pain remains severe. If not improving we can get an MRI and consider referralt to back specialist.

## 2014-06-16 NOTE — Assessment & Plan Note (Signed)
Eval with X-ray, refer to PT, extend prednisone taper longer. If not improving will eval with MRI and likely refer for ESI.

## 2014-06-28 ENCOUNTER — Telehealth: Payer: Self-pay | Admitting: Family Medicine

## 2014-06-28 ENCOUNTER — Other Ambulatory Visit (INDEPENDENT_AMBULATORY_CARE_PROVIDER_SITE_OTHER): Payer: Commercial Managed Care - HMO

## 2014-06-28 DIAGNOSIS — Z7902 Long term (current) use of antithrombotics/antiplatelets: Secondary | ICD-10-CM

## 2014-06-28 DIAGNOSIS — I4891 Unspecified atrial fibrillation: Secondary | ICD-10-CM

## 2014-06-28 DIAGNOSIS — Z452 Encounter for adjustment and management of vascular access device: Secondary | ICD-10-CM

## 2014-06-28 LAB — POCT INR: INR: 2.7

## 2014-06-28 NOTE — Telephone Encounter (Addendum)
Angela Ali notified that after further research, she should avoid the aloe vera juice because when you drink aloe juice, it works like a stimulant laxative by increasing the contractions of your bowel muscles and speeding up your bowel movements. This stimulating effect can cause diarrhea. NIH states that diarrhea can increase the effects of Coumadin, which can increase your risk of bleeding. You should avoid aloe juice if you are taking Coumadin.

## 2014-06-28 NOTE — Telephone Encounter (Signed)
Agreed -

## 2014-06-28 NOTE — Telephone Encounter (Signed)
Spoke with Angela Ali.  I advised that we need to know all the ingredients that is in the aloe vera juice.  She will call back with ingredients.

## 2014-06-28 NOTE — Telephone Encounter (Signed)
Pt asked if she can take aloe vera juice.  Will it interfere with her coumadin?  Please call back at [phone]. Thanks

## 2014-07-03 ENCOUNTER — Ambulatory Visit (INDEPENDENT_AMBULATORY_CARE_PROVIDER_SITE_OTHER): Payer: Commercial Managed Care - HMO | Admitting: Family Medicine

## 2014-07-03 ENCOUNTER — Encounter: Payer: Self-pay | Admitting: Family Medicine

## 2014-07-03 VITALS — BP 120/80 | HR 63 | Temp 98.8°F | Ht 63.0 in | Wt 191.5 lb

## 2014-07-03 DIAGNOSIS — M5416 Radiculopathy, lumbar region: Secondary | ICD-10-CM

## 2014-07-03 MED ORDER — ATORVASTATIN CALCIUM 80 MG PO TABS: 80.0000 mg | ORAL_TABLET | Freq: Every day | ORAL | Status: DC

## 2014-07-03 NOTE — Progress Notes (Signed)
Pre visit review using our clinic review tool, if applicable. No additional management support is needed unless otherwise documented below in the visit note. 

## 2014-07-03 NOTE — Patient Instructions (Addendum)
Stop at front desk to set up MRI.  Cancel PT order. We will call with results of MRI. Continue home PT and use Tens Unit, heat.

## 2014-07-03 NOTE — Progress Notes (Signed)
81 year old female pt with history of CHF, cardiomyopathy, HTN,DM well controlled, Afib on chronic anticoagulation with coumadin, chronic knee pain, hx of herniated disc in lumbar spine seen 2 week ago for presumed acute lumbar radiculopathy causing left leg pain presents following longer prednisone taper, and referral to PT.  Lumbar X-ray 2/19: Moderate to severe degenerative disc disease is seen at all lumbar levels, most severe at L4-5. Vacuum disc phenomenon noted at L3-4 and L5-S1. Moderate bilateral facet DJD also seen at L4-5 and L5-S1. No focal lytic or sclerotic bone lesions and identified. No significant change compared to prior exam.  TODAY: She reports pain not much better with prednisone longer taper. She is doing home PT. She has not set up PT yet.  She has had some improvement in low back pain in right low back , mild in  buttock but more now into into anterior hip. Intermittant urning in left knee. Better with walking, worse with leaning back.  Applying heat. In past tylenol and muscle relaxant did not help. Has Tens unit helps a little. Had PT last year 10/2013 for low back pain.. She reports it did not help at all.  She is still trying to walk as much as she is able.  Review of Systems  Constitutional: Negative for fever and fatigue.  HENT: Negative for ear pain.  Eyes: Negative for pain.  Respiratory: Negative for chest tightness and shortness of breath.  Cardiovascular: Negative for chest pain, palpitations and leg swelling.  Gastrointestinal: Negative for abdominal pain.  Genitourinary: Negative for dysuria.       Objective:   Physical Exam  Constitutional: Vital signs are normal. She appears well-developed and well-nourished. She is cooperative. Non-toxic appearance. She does not appear ill. No distress.  HENT:  Head: Normocephalic.  Right Ear: Hearing, tympanic membrane, external ear and ear canal normal. Tympanic membrane is not erythematous, not  retracted and not bulging.  Left Ear: Hearing, tympanic membrane, external ear and ear canal normal. Tympanic membrane is not erythematous, not retracted and not bulging.  Nose: No mucosal edema or rhinorrhea. Right sinus exhibits no maxillary sinus tenderness and no frontal sinus tenderness. Left sinus exhibits no maxillary sinus tenderness and no frontal sinus tenderness.  Mouth/Throat: Uvula is midline, oropharynx is clear and moist and mucous membranes are normal.  Eyes: Conjunctivae, EOM and lids are normal. Pupils are equal, round, and reactive to light. Lids are everted and swept, no foreign bodies found.  Neck: Trachea normal and normal range of motion. Neck supple. Carotid bruit is not present. No thyroid mass and no thyromegaly present.  Cardiovascular: Normal rate, regular rhythm, S1 normal, S2 normal, normal heart sounds, intact distal pulses and normal pulses. Exam reveals no gallop and no friction rub.  No murmur heard. No peripheral edema  Pulmonary/Chest: Effort normal and breath sounds normal. No tachypnea. No respiratory distress. She has no decreased breath sounds. She has no wheezes. She has no rhonchi. She has no rales.  Abdominal: Soft. Normal appearance and bowel sounds are normal. There is no tenderness.  Musculoskeletal:   Lumbar back: She exhibits decreased range of motion and tenderness. She exhibits no bony tenderness.  ttp in Left sciatic notch Pos SLR, neg faber, no ttp over anterior or lateral hip  FULL ROM IN LEFT HIP! Neurological: She is alert.  Skin: Skin is warm, dry and intact. No rash noted.  Psychiatric: Her speech is normal and behavior is normal. Judgment and thought content normal. Her mood appears  not anxious. Cognition and memory are normal. She does not exhibit a depressed mood.

## 2014-07-03 NOTE — Assessment & Plan Note (Addendum)
Ongoing now with acute flare x 1 month, but hx of chronic back pain x years, worse in last year since 10/2013.  Minimally responsive to PT last year, recent heat, tylenol, 2 courses of prednisone, muscle relaxant, tylenol, and  TENS unit .  Pt is on coumadin so NSAIDs are contraindicated. Will send for MRI lumbar spine and likely for consideration of ESI

## 2014-07-10 ENCOUNTER — Other Ambulatory Visit: Payer: Self-pay

## 2014-07-10 MED ORDER — BENZONATATE 100 MG PO CAPS: 100.0000 mg | ORAL_CAPSULE | Freq: Two times a day (BID) | ORAL | Status: DC | PRN

## 2014-07-10 NOTE — Telephone Encounter (Signed)
Pt left v/m; pt seen 07/03/2014 and pt thought tessalon was going to be refilled at that visit. Pt request refill tessalon to [facility] on [address].Please advise. Pt request cb.

## 2014-07-11 NOTE — Progress Notes (Signed)
Quick Note:  Please inform the patient that cologuard test was normal. No further GI workup ______

## 2014-07-23 ENCOUNTER — Telehealth: Payer: Self-pay

## 2014-07-23 DIAGNOSIS — M810 Age-related osteoporosis without current pathological fracture: Secondary | ICD-10-CM

## 2014-07-23 NOTE — Telephone Encounter (Signed)
Order entered

## 2014-07-23 NOTE — Telephone Encounter (Signed)
Pt left v/m requesting order for bone density;pt having mammogram on 08/01/14. Pt request cb.

## 2014-07-24 ENCOUNTER — Ambulatory Visit: Payer: Self-pay | Admitting: Family Medicine

## 2014-07-25 ENCOUNTER — Encounter: Payer: Self-pay | Admitting: Family Medicine

## 2014-07-26 ENCOUNTER — Telehealth: Payer: Self-pay | Admitting: Family Medicine

## 2014-07-26 ENCOUNTER — Other Ambulatory Visit (INDEPENDENT_AMBULATORY_CARE_PROVIDER_SITE_OTHER): Payer: Commercial Managed Care - HMO

## 2014-07-26 DIAGNOSIS — Z452 Encounter for adjustment and management of vascular access device: Secondary | ICD-10-CM

## 2014-07-26 DIAGNOSIS — Z7902 Long term (current) use of antithrombotics/antiplatelets: Secondary | ICD-10-CM | POA: Diagnosis not present

## 2014-07-26 DIAGNOSIS — I4891 Unspecified atrial fibrillation: Secondary | ICD-10-CM | POA: Diagnosis not present

## 2014-07-26 LAB — POCT INR: INR: 4.1

## 2014-07-26 NOTE — Telephone Encounter (Signed)
Reports placed in Dr. Rometta Emery in box for review.

## 2014-07-26 NOTE — Telephone Encounter (Signed)
Pt dropped off copy of ct report from 2013 and ct report from 2008 for your review.  Placing on Donna's desk.

## 2014-08-02 ENCOUNTER — Encounter: Payer: Self-pay | Admitting: Family Medicine

## 2014-08-02 ENCOUNTER — Other Ambulatory Visit (INDEPENDENT_AMBULATORY_CARE_PROVIDER_SITE_OTHER): Payer: Commercial Managed Care - HMO

## 2014-08-02 DIAGNOSIS — Z452 Encounter for adjustment and management of vascular access device: Secondary | ICD-10-CM | POA: Diagnosis not present

## 2014-08-02 DIAGNOSIS — I4891 Unspecified atrial fibrillation: Secondary | ICD-10-CM | POA: Diagnosis not present

## 2014-08-02 DIAGNOSIS — Z7902 Long term (current) use of antithrombotics/antiplatelets: Secondary | ICD-10-CM | POA: Diagnosis not present

## 2014-08-02 LAB — POCT INR: INR: 3

## 2014-08-04 ENCOUNTER — Other Ambulatory Visit: Payer: Self-pay | Admitting: Family Medicine

## 2014-08-05 NOTE — Telephone Encounter (Signed)
Last office visit 07/03/2014.  Last refilled 07/10/2014 for #30 with 1 refill.  Ok to refill?

## 2014-08-06 ENCOUNTER — Encounter: Payer: Self-pay | Admitting: Family Medicine

## 2014-08-07 ENCOUNTER — Ambulatory Visit: Payer: Commercial Managed Care - HMO | Admitting: Family Medicine

## 2014-08-07 ENCOUNTER — Encounter: Payer: Self-pay | Admitting: Family Medicine

## 2014-08-14 ENCOUNTER — Other Ambulatory Visit: Payer: Self-pay | Admitting: Family Medicine

## 2014-08-24 NOTE — Telephone Encounter (Signed)
Patient calling back with similar symptoms.  She has forgotten the name of the medications that she could take.  Dicussed with patient okay to take Mucinex or Coricidin per previous note.

## 2014-08-30 ENCOUNTER — Other Ambulatory Visit: Payer: Self-pay | Admitting: Family Medicine

## 2014-08-30 ENCOUNTER — Other Ambulatory Visit: Payer: Commercial Managed Care - HMO

## 2014-08-30 ENCOUNTER — Other Ambulatory Visit (INDEPENDENT_AMBULATORY_CARE_PROVIDER_SITE_OTHER): Payer: Commercial Managed Care - HMO

## 2014-08-30 DIAGNOSIS — Z7902 Long term (current) use of antithrombotics/antiplatelets: Secondary | ICD-10-CM | POA: Diagnosis not present

## 2014-08-30 DIAGNOSIS — E785 Hyperlipidemia, unspecified: Secondary | ICD-10-CM | POA: Diagnosis not present

## 2014-08-30 DIAGNOSIS — Z452 Encounter for adjustment and management of vascular access device: Secondary | ICD-10-CM

## 2014-08-30 DIAGNOSIS — I10 Essential (primary) hypertension: Secondary | ICD-10-CM

## 2014-08-30 DIAGNOSIS — E118 Type 2 diabetes mellitus with unspecified complications: Secondary | ICD-10-CM

## 2014-08-30 DIAGNOSIS — I4891 Unspecified atrial fibrillation: Secondary | ICD-10-CM

## 2014-08-30 LAB — LIPID PANEL
CHOLESTEROL: 141 mg/dL (ref 0–200)
HDL: 41.5 mg/dL (ref >39.00)
LDL Cholesterol: 79 mg/dL (ref 0–99)
NONHDL: 99.5
TRIGLYCERIDES: 104 mg/dL (ref 0.0–149.0)
Total CHOL/HDL Ratio: 3
VLDL: 20.8 mg/dL (ref 0.0–40.0)

## 2014-08-30 LAB — BASIC METABOLIC PANEL
BUN: 13 mg/dL (ref 6–23)
CALCIUM: 10.2 mg/dL (ref 8.4–10.5)
CHLORIDE: 104 meq/L (ref 96–112)
CO2: 32 mEq/L (ref 19–32)
CREATININE: 0.87 mg/dL (ref 0.40–1.20)
GFR: 79.87 mL/min (ref >60.00)
Glucose, Bld: 117 mg/dL — ABNORMAL HIGH (ref 70–99)
Potassium: 4.3 mEq/L (ref 3.5–5.1)
Sodium: 142 mEq/L (ref 135–145)

## 2014-08-30 LAB — HEMOGLOBIN A1C: HEMOGLOBIN A1C: 6.1 % (ref 4.6–6.5)

## 2014-08-30 LAB — TSH: TSH: 1.12 u[IU]/mL (ref 0.35–4.50)

## 2014-08-30 LAB — POCT INR: INR: 4.7

## 2014-08-31 ENCOUNTER — Other Ambulatory Visit: Payer: Commercial Managed Care - HMO

## 2014-09-07 ENCOUNTER — Ambulatory Visit (INDEPENDENT_AMBULATORY_CARE_PROVIDER_SITE_OTHER): Payer: Commercial Managed Care - HMO | Admitting: Family Medicine

## 2014-09-07 ENCOUNTER — Encounter: Payer: Self-pay | Admitting: Family Medicine

## 2014-09-07 VITALS — BP 132/76 | HR 71 | Temp 98.0°F | Ht 61.5 in | Wt 186.5 lb

## 2014-09-07 DIAGNOSIS — J301 Allergic rhinitis due to pollen: Secondary | ICD-10-CM | POA: Diagnosis not present

## 2014-09-07 DIAGNOSIS — R05 Cough: Secondary | ICD-10-CM

## 2014-09-07 DIAGNOSIS — Z7189 Other specified counseling: Secondary | ICD-10-CM | POA: Diagnosis not present

## 2014-09-07 DIAGNOSIS — E118 Type 2 diabetes mellitus with unspecified complications: Secondary | ICD-10-CM

## 2014-09-07 DIAGNOSIS — I10 Essential (primary) hypertension: Secondary | ICD-10-CM | POA: Diagnosis not present

## 2014-09-07 DIAGNOSIS — Z0000 Encounter for general adult medical examination without abnormal findings: Secondary | ICD-10-CM

## 2014-09-07 DIAGNOSIS — R053 Chronic cough: Secondary | ICD-10-CM

## 2014-09-07 DIAGNOSIS — E785 Hyperlipidemia, unspecified: Secondary | ICD-10-CM

## 2014-09-07 DIAGNOSIS — I5022 Chronic systolic (congestive) heart failure: Secondary | ICD-10-CM

## 2014-09-07 NOTE — Patient Instructions (Addendum)
Look into advanced care planning. Start Claritin for dry cough. Work on walking and healthy eating.

## 2014-09-07 NOTE — Assessment & Plan Note (Signed)
Well controlled. Continue current medication.  

## 2014-09-07 NOTE — Assessment & Plan Note (Signed)
Likely cause of dry cough. No sign of infection. Treat with claritin and flonase.

## 2014-09-07 NOTE — Progress Notes (Signed)
HPI  I have personally reviewed the Medicare Annual Wellness questionnaire and have noted  1. The patient's medical and social history  2. Their use of alcohol, tobacco or illicit drugs  3. Their current medications and supplements  4. The patient's functional ability including ADL's, fall risks, home safety risks and hearing or visual  impairment.  5. Diet and physical activities  6. Evidence for depression or mood disorders  The patients weight, height, BMI and visual acuity have been recorded in the chart  I have made referrals, counseling and provided education to the patient based review of the above and I have provided the pt with a written personalized care plan for preventive services.    She has continued dry cough x months, itchy in throat. No change in SOB, no fever. Feels like has to clear throat. No nasal congestion. Benzonatate not helping.  GERD well controlled on current regimen on omeprazole.  Hypercholesterol: well controlle don current regimen of lipitor 80 mg Lab Results  Component Value Date   CHOL 141 08/30/2014   HDL 41.50 08/30/2014   LDLCALC 79 08/30/2014   TRIG 104.0 08/30/2014   CHOLHDL 3 08/30/2014  Diet compliance: good Exercise; walking daily Wt Readings from Last 3 Encounters:  09/07/14 186 lb 8 oz (84.596 kg)  07/03/14 191 lb 8 oz (86.864 kg)  06/15/14 187 lb 4 oz (84.936 kg)    Hypertension:well controlled on current regimen.  BP Readings from Last 3 Encounters:  09/07/14 132/76  07/03/14 120/80  06/15/14 130/90    BP Readings from Last 3 Encounters:  08/31/13 124/82  08/04/13 116/82  07/20/13 140/85  Using medication without problems or lightheadedness: None  Chest pain with exertion:None  Fatigued legs. Edema:None  Short of breath:None  Average home BPs: 120/70s  Other issues:Afib, CHF: euvolemic   Diabetes: well controlled. Lab Results  Component Value Date   HGBA1C 6.1 08/30/2014   Lab Results   Component Value Date   HGBA1C 6.4 06/20/2012  Hypoglycemic episodes:None  Hyperglycemic episodes:None  Feet problems:None  Blood Sugars averaging: FBS: 113-120  eye exam within last year:08/07/2013  Review of Systems  Constitutional: Negative for fever and fatigue.  HENT: Negative for ear pain.  Eyes: Negative for pain.  Respiratory: Negative for chest tightness and shortness of breath.  Cardiovascular: Negative for chest pain, palpitations and leg swelling.  Gastrointestinal: Negative for abdominal pain.  Genitourinary: Negative for dysuria.  Neurological: Negative for syncope and light-headedness.  Psychiatric/Behavioral: No depression.  Objective:   Physical Exam  Constitutional: Vital signs are normal. She appears well-developed and well-nourished. She is cooperative. Non-toxic appearance. She does not appear ill. No distress.  HENT:  Head: Normocephalic.  Right Ear: Hearing, tympanic membrane, external ear and ear canal normal.  Left Ear: Hearing, tympanic membrane, external ear and ear canal normal.  Nose: Nose turbinate swollen and pale Eyes: Conjunctivae, EOM and lids are normal. Pupils are equal, round, and reactive to light. No foreign bodies found.  Neck: Trachea normal and normal range of motion. Neck supple. Carotid bruit is not present. No mass and no thyromegaly present.  Cardiovascular: Normal rate, S1 normal, S2 normal and intact distal pulses. An irregular rhythm present. Exam reveals distant heart sounds. Exam reveals no gallop.  No murmur heard. Slight B peripheral edema  Pulmonary/Chest: Effort normal and breath sounds normal. No respiratory distress. She has no wheezes. She has no rhonchi. She has no rales.  Abdominal: Soft. Normal appearance and bowel sounds are normal. She exhibits  no distension, no fluid wave, no abdominal bruit and no mass. There is no hepatosplenomegaly. There is no tenderness. There is no rebound, no guarding  and no CVA tenderness. No hernia.  Lymphadenopathy:  She has no cervical adenopathy.  She has no axillary adenopathy.  Neurological: She is alert. She has normal strength. No cranial nerve deficit or sensory deficit.  Skin: Skin is warm, dry and intact.  Psychiatric: Her speech is normal and behavior is normal. Judgment normal. Her mood appears not anxious. Cognition and memory are normal. She does not exhibit a depressed mood.  MSK: left knee, pain laterally, neg Mcmurray's, [pain with flexion, limping gait.  Diabetic foot exam:  Normal inspection  No skin breakdown  No calluses  Normal DP pulses  Normal sensation to light touch and monofilament  Nails thickened  Assessment & Plan:   AMW: The patient's preventative maintenance and recommended screening tests for an annual wellness exam were reviewed in full today.  Brought up to date unless services declined.  Counselled on the importance of diet, exercise, and its role in overall health and mortality.  The patient's FH and SH was reviewed, including their home life, tobacco status, and drug and alcohol status.   Vaccines:Uptodate with flu, PNA and Td, considering shingles vaccine,  No indication for pap, DVE given age, hysterectomy.  Mammo: No family history of first degree relative. She wishes to continue mammograms. 07/2014 stable Colon: Nml in 2009, per Dr. Deatra Ina, had nml cologuard in 05/2014. Nonsmoker.  DEXA: Stable osteopenia, 07/2014, Repeat 2-5 years.

## 2014-09-07 NOTE — Assessment & Plan Note (Signed)
Euvolemic on current regimen. 

## 2014-09-07 NOTE — Progress Notes (Signed)
Pre visit review using our clinic review tool, if applicable. No additional management support is needed unless otherwise documented below in the visit note. 

## 2014-09-13 ENCOUNTER — Other Ambulatory Visit (INDEPENDENT_AMBULATORY_CARE_PROVIDER_SITE_OTHER): Payer: Commercial Managed Care - HMO

## 2014-09-13 DIAGNOSIS — I4891 Unspecified atrial fibrillation: Secondary | ICD-10-CM | POA: Diagnosis not present

## 2014-09-13 DIAGNOSIS — Z7902 Long term (current) use of antithrombotics/antiplatelets: Secondary | ICD-10-CM

## 2014-09-13 DIAGNOSIS — Z452 Encounter for adjustment and management of vascular access device: Secondary | ICD-10-CM | POA: Diagnosis not present

## 2014-09-13 LAB — POCT INR: INR: 2.8

## 2014-09-14 ENCOUNTER — Ambulatory Visit: Payer: Commercial Managed Care - HMO | Admitting: Cardiovascular Disease

## 2014-09-26 ENCOUNTER — Telehealth: Payer: Self-pay

## 2014-09-26 NOTE — Telephone Encounter (Signed)
Pt was seen 09/07/14 for annual exam and allergic rhinitis; pt continues with a lot of drainage at back of throat that causes a cough and scratchy throat;no fever; pt taking claritin and tessalon; pt request stronger med than claritin. Pt request cb.[email or web address] [address].

## 2014-09-27 ENCOUNTER — Encounter: Payer: Self-pay | Admitting: Family Medicine

## 2014-09-27 ENCOUNTER — Ambulatory Visit (INDEPENDENT_AMBULATORY_CARE_PROVIDER_SITE_OTHER): Payer: Commercial Managed Care - HMO | Admitting: Family Medicine

## 2014-09-27 VITALS — BP 162/90 | HR 86 | Temp 98.8°F | Ht 61.5 in | Wt 186.2 lb

## 2014-09-27 DIAGNOSIS — J0110 Acute frontal sinusitis, unspecified: Secondary | ICD-10-CM

## 2014-09-27 DIAGNOSIS — J4521 Mild intermittent asthma with (acute) exacerbation: Secondary | ICD-10-CM | POA: Diagnosis not present

## 2014-09-27 DIAGNOSIS — I10 Essential (primary) hypertension: Secondary | ICD-10-CM

## 2014-09-27 DIAGNOSIS — J019 Acute sinusitis, unspecified: Secondary | ICD-10-CM | POA: Insufficient documentation

## 2014-09-27 DIAGNOSIS — J301 Allergic rhinitis due to pollen: Secondary | ICD-10-CM

## 2014-09-27 MED ORDER — FEXOFENADINE HCL 180 MG PO TABS: 180.0000 mg | ORAL_TABLET | Freq: Every day | ORAL | Status: DC

## 2014-09-27 MED ORDER — AMOXICILLIN 500 MG PO CAPS: 1000.0000 mg | ORAL_CAPSULE | Freq: Two times a day (BID) | ORAL | Status: DC

## 2014-09-27 MED ORDER — MONTELUKAST SODIUM 10 MG PO TABS
10.0000 mg | ORAL_TABLET | Freq: Every day | ORAL | Status: DC
Start: 2014-09-27 — End: 2015-06-06

## 2014-09-27 NOTE — Telephone Encounter (Signed)
Change to allegra 180 OTC and I willl send in additional med called Singulair to take at bedtime  As well for allergies.

## 2014-09-27 NOTE — Assessment & Plan Note (Signed)
Change to allegra and start on singulair.

## 2014-09-27 NOTE — Assessment & Plan Note (Signed)
Likely elevated due to current illness. Follow over time, continue current regimen.

## 2014-09-27 NOTE — Progress Notes (Signed)
   Subjective:    Patient ID: Angela Ali, female    DOB: 21-Oct-1928, 81 y.o.   MRN: [id number]  HPI  81 year old female with history of chronic cough, CHF, HTN, DM, afib, GERD and allergies. Hx of PNA in past on CXR in 01/2014, s/p treatment  Returns with continued cough despite antihistamines ( retried flonase and claritin)  Seen on 5/13 : She has continued dry cough x months, itchy in throat. No change in SOB, no fever. Feels like has to clear throat. No nasal congestion. Benzonatate not helping.   Today she reports nasal drainage and post nasal drip. Causing cough. Yellowish mucus. No sinus pain , some pressure. Frontal headache, mild.  No ear pain.  No fever but sweats and chills.  SOB with coughing fits. Cannot breath with lying flat given mucus.  Chest wall sore with coughing.  BP elevated today. No decongestants.. Usually well controlle don current regimen. BP Readings from Last 3 Encounters:  09/27/14 162/90  09/07/14 132/76  07/03/14 120/80     Review of Systems  Constitutional: Positive for fatigue. Negative for fever.  HENT: Negative for ear pain.   Eyes: Negative for pain.  Respiratory: Positive for cough. Negative for chest tightness and shortness of breath.   Cardiovascular: Positive for chest pain. Negative for palpitations and leg swelling.  Gastrointestinal: Negative for abdominal pain.  Genitourinary: Negative for dysuria.       Objective:   Physical Exam  Constitutional: Vital signs are normal. She appears well-developed and well-nourished. She is cooperative.  Non-toxic appearance. She does not appear ill. No distress.  HENT:  Head: Normocephalic.  Right Ear: Hearing, tympanic membrane, external ear and ear canal normal. Tympanic membrane is not erythematous, not retracted and not bulging.  Left Ear: Hearing, tympanic membrane, external ear and ear canal normal. Tympanic membrane is not erythematous, not retracted and not bulging.  Nose: Mucosal edema  and rhinorrhea present. Right sinus exhibits maxillary sinus tenderness and frontal sinus tenderness. Left sinus exhibits maxillary sinus tenderness and frontal sinus tenderness.  Mouth/Throat: Uvula is midline, oropharynx is clear and moist and mucous membranes are normal. No posterior oropharyngeal edema or posterior oropharyngeal erythema.  Eyes: Conjunctivae, EOM and lids are normal. Pupils are equal, round, and reactive to light. Lids are everted and swept, no foreign bodies found.  Neck: Trachea normal and normal range of motion. Neck supple. Carotid bruit is not present. No thyroid mass and no thyromegaly present.  Cardiovascular: Normal rate, regular rhythm, S1 normal, S2 normal, normal heart sounds, intact distal pulses and normal pulses.  Exam reveals no gallop and no friction rub.   No murmur heard. Pulmonary/Chest: Effort normal and breath sounds normal. No tachypnea. No respiratory distress. She has no decreased breath sounds. She has no wheezes. She has no rhonchi. She has no rales.  Neurological: She is alert.  Skin: Skin is warm, dry and intact. No rash noted.  Psychiatric: Her speech is normal and behavior is normal. Judgment normal. Her mood appears not anxious. Cognition and memory are normal. She does not exhibit a depressed mood.          Assessment & Plan:

## 2014-09-27 NOTE — Telephone Encounter (Signed)
Angela Ali is scheduled to see you today at 11:45 am.

## 2014-09-27 NOTE — Progress Notes (Signed)
Pre visit review using our clinic review tool, if applicable. No additional management support is needed unless otherwise documented below in the visit note. 

## 2014-09-27 NOTE — Patient Instructions (Addendum)
Stop Claritin. Change to allergra 180 mg daily.  Start bedtime Singulair daily.  Continue flonase and start nasal saline spray.  Complete a course of antibiotics. Return on Monday for early INR /PT check.

## 2014-09-27 NOTE — Assessment & Plan Note (Signed)
Treat with amox x 10 days give symptoms > 2 weeks.

## 2014-09-28 ENCOUNTER — Telehealth: Payer: Self-pay | Admitting: Family Medicine

## 2014-09-28 MED ORDER — GUAIFENESIN-CODEINE 100-10 MG/5ML PO SYRP: 5.0000 mL | ORAL_SOLUTION | Freq: Every evening | ORAL | Status: DC | PRN

## 2014-09-28 NOTE — Telephone Encounter (Addendum)
Prescription called to [place].  Ms Consalvo notified.

## 2014-09-28 NOTE — Telephone Encounter (Signed)
Patient came in yesterday.  Patient said she's taking the antibiotic she was given.  Patient has a lot of drainage. Patient wants to know if she can have something called in for her cough.  The cough kept her up all night last night. Patient uses [facility] Please call patient when rx is called in.

## 2014-10-01 ENCOUNTER — Other Ambulatory Visit (INDEPENDENT_AMBULATORY_CARE_PROVIDER_SITE_OTHER): Payer: Commercial Managed Care - HMO

## 2014-10-01 DIAGNOSIS — I4891 Unspecified atrial fibrillation: Secondary | ICD-10-CM

## 2014-10-01 DIAGNOSIS — Z452 Encounter for adjustment and management of vascular access device: Secondary | ICD-10-CM

## 2014-10-01 DIAGNOSIS — Z7902 Long term (current) use of antithrombotics/antiplatelets: Secondary | ICD-10-CM

## 2014-10-01 LAB — POCT INR: INR: 2.1

## 2014-10-11 ENCOUNTER — Other Ambulatory Visit: Payer: Commercial Managed Care - HMO

## 2014-10-23 ENCOUNTER — Ambulatory Visit (INDEPENDENT_AMBULATORY_CARE_PROVIDER_SITE_OTHER): Payer: Commercial Managed Care - HMO | Admitting: Cardiovascular Disease

## 2014-10-23 ENCOUNTER — Encounter: Payer: Self-pay | Admitting: Cardiovascular Disease

## 2014-10-23 ENCOUNTER — Encounter (INDEPENDENT_AMBULATORY_CARE_PROVIDER_SITE_OTHER): Payer: Self-pay

## 2014-10-23 VITALS — BP 140/82 | HR 61 | Ht 64.0 in | Wt 186.5 lb

## 2014-10-23 DIAGNOSIS — I5032 Chronic diastolic (congestive) heart failure: Secondary | ICD-10-CM | POA: Diagnosis not present

## 2014-10-23 DIAGNOSIS — I4891 Unspecified atrial fibrillation: Secondary | ICD-10-CM

## 2014-10-23 DIAGNOSIS — E785 Hyperlipidemia, unspecified: Secondary | ICD-10-CM

## 2014-10-23 NOTE — Assessment & Plan Note (Signed)
Cholesterol is at goal on the current lipid regimen. No changes to the medications were made.  

## 2014-10-23 NOTE — Progress Notes (Signed)
Patient ID: Angela Ali, female    DOB: [birth year], 81 y.o.   MRN: [id number]  HPI Comments: Angela Ali is a very pleasant 81 year old woman with a history of chronic atrial fibrillation on warfarin, hypertension, chronic lower extremity edema, shortness of breath, who presents for routine followup of her atrial fibrillation.  History of fall with hematoma of the right lower extremity,  required care from the wound clinic for a large ulcerative wound on the right lateral aspect of her leg. This has healed well. History of medication confusion, some noncompliance. Prior trauma to her right lower extremity, surgery to her left ankle  She reports that she is doing well. No complaints. Active, does not like to use her cane Goes shopping with family. No recent falls Minimal leg edema, like to sleep in her recliner. Poor sleep hygiene, goes to bed late No regular exercise program. Denies having any significant chest pain with exertion. She does have chronic shortness of breath with exertion.  EKG on today's visit shows atrial fibrillation, nonspecific ST abnormality  Other past medical history  Calcium channel blocker/diltiazem was held in the past secondary to edema.     Allergies  Allergen Reactions  . Morphine And Related   . Percocet [Oxycodone-Acetaminophen] Nausea Only       . Tramadol Nausea Only    Current Outpatient Prescriptions on File Prior to Visit  Medication Sig Dispense Refill  . ACCU-CHEK FASTCLIX LANCETS MISC 1 strip by Does not apply route 2 (two) times daily as needed. dx 250.90 306 each 1  . acetaminophen (TYLENOL ARTHRITIS PAIN) 650 MG CR tablet Take 650 mg by mouth daily.    Marland Kitchen amoxicillin (AMOXIL) 500 MG capsule Take 2 capsules (1,000 mg total) by mouth 2 (two) times daily. 40 capsule 0  . Ascorbic Acid (VITAMIN C PO) Take 1 tablet by mouth daily.    Marland Kitchen atorvastatin (LIPITOR) 80 MG tablet Take 1 tablet (80 mg total) by mouth daily. 90 tablet 3  . benzonatate  (TESSALON) 100 MG capsule TAKE TWO CAPSULES BY MOUTH TWICE DAILY AS NEEDED FOR COUGH 162 capsule 0  . Blood Glucose Monitoring Suppl (ACCU-CHEK NANO SMARTVIEW) W/DEVICE KIT 1 Device by Does not apply route 2 (two) times daily. Use device to check blood sugar dx 250.90 1 kit 0  . Cholecalciferol (VITAMIN D-3 PO) Take 1 tablet by mouth daily.    . cloNIDine (CATAPRES) 0.1 MG tablet Take 0.5 tablets (0.05 mg total) by mouth 2 (two) times daily. 60 tablet 6  . cyclobenzaprine (FLEXERIL) 5 MG tablet Take 1-2 tablets (5-10 mg total) by mouth at bedtime. 30 tablet 0  . digoxin (LANOXIN) 0.125 MG tablet Take 1 tablet (0.125 mg total) by mouth daily. 90 tablet 3  . diltiazem (CARDIZEM) 30 MG tablet Take 1 tablet (30 mg total) by mouth 4 (four) times daily as needed. For fast heart beat 120 tablet 0  . fexofenadine (ALLEGRA) 180 MG tablet Take 1 tablet (180 mg total) by mouth daily. 30 tablet 11  . fluticasone (FLONASE) 50 MCG/ACT nasal spray Place 2 sprays into the nose daily. 48 g 1  . furosemide (LASIX) 20 MG tablet Take 1 tablet (20 mg total) by mouth daily. 30 tablet 3  . glucose blood test strip Use as instructed to check blood sugar 2 (two) times daily dx 250.90 200 each 2  . guaiFENesin-codeine (ROBITUSSIN AC) 100-10 MG/5ML syrup Take 5-10 mLs by mouth at bedtime as needed for cough. 180 mL  0  . hydrALAZINE (APRESOLINE) 50 MG tablet TAKE 1 TABLET THREE TIMES DAILY 270 tablet 3  . hydrochlorothiazide (HYDRODIURIL) 25 MG tablet TAKE 1 TABLET (25 MG TOTAL) BY MOUTH DAILY. 90 tablet 3  . latanoprost (XALATAN) 0.005 % ophthalmic solution     . losartan (COZAAR) 100 MG tablet Take 1 tablet (100 mg total) by mouth daily. 90 tablet 3  . metoprolol succinate (TOPROL-XL) 100 MG 24 hr tablet TAKE 1 TABLET EVERY DAY 90 tablet 1  . montelukast (SINGULAIR) 10 MG tablet Take 1 tablet (10 mg total) by mouth at bedtime. 30 tablet 3  . omeprazole (PRILOSEC) 20 MG capsule TAKE 1 CAPSULE TWICE DAILY BEFORE A MEAL 180  capsule 3  . ondansetron (ZOFRAN) 4 MG tablet Take 1 tablet (4 mg total) by mouth every 8 (eight) hours as needed for nausea or vomiting. 20 tablet 0  . potassium chloride SA (K-DUR,KLOR-CON) 20 MEQ tablet TAKE 1 TABLET EVERY DAY 90 tablet 3  . traMADol (ULTRAM) 50 MG tablet Take 1 tablet (50 mg total) by mouth every 8 (eight) hours as needed. 30 tablet 0  . warfarin (COUMADIN) 2.5 MG tablet Take 2.5 mg by mouth See admin instructions. Take 2.5 mg on Tuesday, Thursday, and Saturdays (alternating with $RemoveBeforeDEI'5mg'KSyvUXfzyGxLTbBq$  on other days)    . warfarin (COUMADIN) 5 MG tablet TAKE 1 TABLET  ON  MONDAY,  WEDNESDAY,  FRIDAY,  $Remove'SUNDAY. TAKE 1/2 TABLET ON OTHER DAYS 71 tablet 0  . [DISCONTINUED] esomeprazole (NEXIUM) 40 MG capsule Take 40 mg by mouth daily.       No current facility-administered medications on file prior to visit.    Past Medical History  Diagnosis Date  . Overweight(278.02)   . Secondary cardiomyopathy, unspecified   . Atrial flutter   . Congestive heart failure, unspecified   . Edema   . Other and unspecified hyperlipidemia   . Unspecified essential hypertension   . Allergic rhinitis, cause unspecified   . Colonic polyp   . Diverticulosis of colon (without mention of hemorrhage)   . Other malaise and fatigue   . Stricture and stenosis of esophagus   . Bacterial pneumonia, unspecified   . Other abnormal glucose   . Esophageal reflux   . Osteoarthrosis, unspecified whether generalized or localized, unspecified site   . Displacement of lumbar intervertebral disc without myelopathy   . Diaphragmatic hernia without mention of obstruction or gangrene   . Long term (current) use of anticoagulants   . Atrial fibrillation   . Prediabetes   . Splenic mass 08/21/2011  . Thigh abscess   . Diabetes mellitus without complication     borderline-checks sugars but no meds    Past Surgical History  Procedure Laterality Date  . Esophageal dilation  01/2007  . Abdominal hysterectomy  1971    partial   . Ankle surgery Left 2001  . Cholecystectomy  2004  . Colonoscopy    . Polypectomy    . Spleen removed      20'YpKYodO$ 05 or 2006 in [place] History  reports that she has never smoked. She has never used smokeless tobacco. She reports that she does not drink alcohol or use illicit drugs.  Family History family history includes Bone cancer in her son; Coronary artery disease (age of onset: 64) in her mother; Diabetes in her daughter and daughter; Emphysema (age of onset: 41) in her father; Hyperlipidemia in her sister and sister; Hypertension in her daughter and daughter; Hypertension (age of  onset: 38) in her mother; Ovarian cancer in her daughter. There is no history of Colon cancer.   Review of Systems  Constitutional: Negative.   Respiratory: Negative.   Cardiovascular: Negative.   Musculoskeletal: Positive for back pain and gait problem.  Skin: Negative.   Neurological: Negative.   Hematological: Negative.   All other systems reviewed and are negative.  BP 140/82 mmHg  Pulse 61  Ht $R'5\' 4"'OK$  (1.626 m)  Wt 186 lb 8 oz (84.596 kg)  BMI 32.00 kg/m2  Physical Exam  Constitutional: She is oriented to person, place, and time. She appears well-developed and well-nourished.  HENT:  Head: Normocephalic.  Nose: Nose normal.  Mouth/Throat: Oropharynx is clear and moist.  Eyes: Conjunctivae are normal. Pupils are equal, round, and reactive to light.  Neck: Normal range of motion. Neck supple. No JVD present.  Cardiovascular: Normal rate, S1 normal, S2 normal and intact distal pulses.  An irregularly irregular rhythm present. Exam reveals no gallop and no friction rub.   Murmur heard.  Systolic murmur is present with a grade of 2/6  Trace edema above the socks  Pulmonary/Chest: Effort normal and breath sounds normal. No respiratory distress. She has no wheezes. She has no rales. She exhibits no tenderness.  Abdominal: Soft. Bowel sounds are normal. She exhibits no distension.  There is no tenderness.  Musculoskeletal: Normal range of motion. She exhibits no edema or tenderness.  Lymphadenopathy:    She has no cervical adenopathy.  Neurological: She is alert and oriented to person, place, and time. Coordination normal.  Skin: Skin is warm and dry. No rash noted. No erythema.  Psychiatric: She has a normal mood and affect. Her behavior is normal. Judgment and thought content normal.    Assessment and Plan  Nursing note and vitals reviewed.

## 2014-10-23 NOTE — Assessment & Plan Note (Signed)
Heart rate well-controlled. Tolerating anticoagulation. Monitored by primary care We'll continue metoprolol for rate control

## 2014-10-23 NOTE — Patient Instructions (Signed)
You are doing well. No medication changes were made.  Please call us if you have new issues that need to be addressed before your next appt.  Your physician wants you to follow-up in: 12 months.  You will receive a reminder letter in the mail two months in advance. If you don't receive a letter, please call our office to schedule the follow-up appointment. 

## 2014-10-23 NOTE — Assessment & Plan Note (Signed)
Euvolemic on her current medication regimen. Recent BMP shows normal electrolytes No significant edema or abdominal swelling, no shortness of breath beyond her baseline

## 2014-11-01 ENCOUNTER — Ambulatory Visit (INDEPENDENT_AMBULATORY_CARE_PROVIDER_SITE_OTHER): Payer: Commercial Managed Care - HMO | Admitting: *Deleted

## 2014-11-01 ENCOUNTER — Ambulatory Visit: Payer: Commercial Managed Care - HMO

## 2014-11-01 ENCOUNTER — Other Ambulatory Visit: Payer: Commercial Managed Care - HMO

## 2014-11-01 DIAGNOSIS — Z7902 Long term (current) use of antithrombotics/antiplatelets: Secondary | ICD-10-CM

## 2014-11-01 DIAGNOSIS — Z452 Encounter for adjustment and management of vascular access device: Secondary | ICD-10-CM | POA: Diagnosis not present

## 2014-11-01 DIAGNOSIS — I4891 Unspecified atrial fibrillation: Secondary | ICD-10-CM | POA: Diagnosis not present

## 2014-11-01 DIAGNOSIS — Z5181 Encounter for therapeutic drug level monitoring: Secondary | ICD-10-CM | POA: Diagnosis not present

## 2014-11-01 LAB — POCT INR: INR: 2.9

## 2014-11-01 NOTE — Progress Notes (Signed)
Pre visit review using our clinic review tool, if applicable. No additional management support is needed unless otherwise documented below in the visit note. 

## 2014-11-09 ENCOUNTER — Telehealth: Payer: Self-pay | Admitting: Family Medicine

## 2014-11-09 NOTE — Telephone Encounter (Signed)
Noted  

## 2014-11-09 NOTE — Telephone Encounter (Signed)
[facility]  Patient Name: Angela Ali  DOB: [birth year]    Initial Comment Caller States she just got stung by a bee on her right arm. has some swelling, thinks she got the stinger.    Nurse Assessment  Nurse: Justine Null, RN, Rodena Piety Date/Time (Eastern Time): 11/09/2014 3:37:45 PM  Confirm and document reason for call. If symptomatic, describe symptoms. ---Caller States she just got stung by a bee on her right arm. has some swelling, thinks she got the stinger about an hour ago and has been able to apply ice to the area and has the area on the inside of her forearm in the middle and has been having no pain at present  Has the patient traveled out of the country within the last 30 days? ---No  Does the patient require triage? ---Yes  Related visit to physician within the last 2 weeks? ---No  Does the PT have any chronic conditions? (i.e. diabetes, asthma, etc.) ---Yes  List chronic conditions. ---diabetes type II HTN heart disease a fib     Guidelines    Guideline Title Affirmed Question Affirmed Notes  Bee or Yellow Jacket Sting Normal local reaction to bee, wasp, or yellow jacket sting (all triage questions negative)    Final Disposition User   [place], RN, Rodena Piety    Disagree/Comply: Comply

## 2014-11-17 ENCOUNTER — Other Ambulatory Visit: Payer: Self-pay | Admitting: Family Medicine

## 2014-11-17 ENCOUNTER — Other Ambulatory Visit: Payer: Self-pay | Admitting: Cardiovascular Disease

## 2014-11-28 ENCOUNTER — Telehealth: Payer: Self-pay | Admitting: Family Medicine

## 2014-11-28 ENCOUNTER — Ambulatory Visit: Payer: Commercial Managed Care - HMO | Admitting: Family Medicine

## 2014-11-28 DIAGNOSIS — Z0289 Encounter for other administrative examinations: Secondary | ICD-10-CM

## 2014-11-28 NOTE — Telephone Encounter (Signed)
Patient did not come in for their appointment today for non productive cough.  Please let me know if patient needs to be contacted immediately for follow up or no follow up needed.

## 2014-11-28 NOTE — Telephone Encounter (Signed)
No routine f/u is needed - i think it is up to the patient. If she needs thinks she needs it, I would suggest non-urgent f/u with Dr. B when she returns

## 2014-11-29 ENCOUNTER — Ambulatory Visit: Payer: Commercial Managed Care - HMO

## 2014-11-29 NOTE — Telephone Encounter (Signed)
Pt is doing better with otc medication

## 2014-12-05 ENCOUNTER — Ambulatory Visit: Payer: Commercial Managed Care - HMO | Admitting: Family Medicine

## 2015-01-18 ENCOUNTER — Ambulatory Visit (INDEPENDENT_AMBULATORY_CARE_PROVIDER_SITE_OTHER): Payer: Commercial Managed Care - HMO | Admitting: Family Medicine

## 2015-01-18 ENCOUNTER — Ambulatory Visit (INDEPENDENT_AMBULATORY_CARE_PROVIDER_SITE_OTHER): Payer: Commercial Managed Care - HMO | Admitting: *Deleted

## 2015-01-18 ENCOUNTER — Encounter: Payer: Self-pay | Admitting: Family Medicine

## 2015-01-18 VITALS — BP 124/87 | HR 96 | Temp 98.5°F | Ht 64.0 in | Wt 185.5 lb

## 2015-01-18 DIAGNOSIS — M25561 Pain in right knee: Secondary | ICD-10-CM

## 2015-01-18 DIAGNOSIS — Z23 Encounter for immunization: Secondary | ICD-10-CM

## 2015-01-18 DIAGNOSIS — M25562 Pain in left knee: Secondary | ICD-10-CM | POA: Diagnosis not present

## 2015-01-18 DIAGNOSIS — G8929 Other chronic pain: Secondary | ICD-10-CM

## 2015-01-18 DIAGNOSIS — M170 Bilateral primary osteoarthritis of knee: Secondary | ICD-10-CM

## 2015-01-18 DIAGNOSIS — Z5181 Encounter for therapeutic drug level monitoring: Secondary | ICD-10-CM | POA: Diagnosis not present

## 2015-01-18 DIAGNOSIS — I4891 Unspecified atrial fibrillation: Secondary | ICD-10-CM

## 2015-01-18 LAB — PROTIME-INR
INR: 3.4 ratio — ABNORMAL HIGH (ref 0.8–1.0)
PROTHROMBIN TIME: 36.7 s — AB (ref 9.6–13.1)

## 2015-01-18 LAB — POCT INR: INR: 3.4

## 2015-01-18 MED ORDER — DICLOFENAC SODIUM 1 % TD GEL: 4.0000 g | Freq: Four times a day (QID) | TRANSDERMAL | Status: DC | PRN

## 2015-01-18 NOTE — Addendum Note (Signed)
Addended by: Marchia Bond on: 01/18/2015 02:37 PM   Modules accepted: Miquel Dunn

## 2015-01-18 NOTE — Progress Notes (Signed)
Subjective:    Patient ID: Angela Ali, female    DOB: 1928/04/28, 81 y.o.   MRN: [id number]  HPI   81 year old female with history of chronic low back and knee pain presents with bilateral knee pain.  Gradually worsening in last few years. She reports anterior knees, radiates down lower legs as well. Popping in knees with bending. Increased pain with bending,walking. Some pain and stiffness at rest. Has deformity, no redness or swelling in knees.   She feel unstable on her feet.  She has been using Aspercreme, liniment, icy hot, tylenol arthritis. Doesn't help much.  Tramadol upsets her stomach some, not sure is helped in past.  She has had injection in knees a long time ago by Ortho.Marland Kitchen Helped for about 7 years.   ON coumadin. She is also very overdue for her INR check.. She was waiting for someone to call to schedule.  Will check today in lab.   X-ray in 2012 on right knee: tricompartmental arthritis.     Review of Systems  Constitutional: Negative for fever and fatigue.  HENT: Negative for ear pain.   Eyes: Negative for pain.  Respiratory: Negative for chest tightness and shortness of breath.   Cardiovascular: Negative for chest pain, palpitations and leg swelling.  Gastrointestinal: Negative for abdominal pain.  Genitourinary: Negative for dysuria.       Objective:   Physical Exam  Constitutional: Vital signs are normal. She appears well-developed and well-nourished. She is cooperative.  Non-toxic appearance. She does not appear ill. No distress.  Elderly female in NAD  HENT:  Head: Normocephalic.  Right Ear: Hearing, tympanic membrane, external ear and ear canal normal. Tympanic membrane is not erythematous, not retracted and not bulging.  Left Ear: Hearing, tympanic membrane, external ear and ear canal normal. Tympanic membrane is not erythematous, not retracted and not bulging.  Nose: No mucosal edema or rhinorrhea. Right sinus exhibits no maxillary sinus  tenderness and no frontal sinus tenderness. Left sinus exhibits no maxillary sinus tenderness and no frontal sinus tenderness.  Mouth/Throat: Uvula is midline, oropharynx is clear and moist and mucous membranes are normal.  Eyes: Conjunctivae, EOM and lids are normal. Pupils are equal, round, and reactive to light. Lids are everted and swept, no foreign bodies found.  Neck: Trachea normal and normal range of motion. Neck supple. Carotid bruit is not present. No thyroid mass and no thyromegaly present.  Cardiovascular: Normal rate, S1 normal, S2 normal, normal heart sounds, intact distal pulses and normal pulses.  An irregularly irregular rhythm present. Exam reveals no gallop and no friction rub.   No murmur heard. Pulmonary/Chest: Effort normal and breath sounds normal. No tachypnea. No respiratory distress. She has no decreased breath sounds. She has no wheezes. She has no rhonchi. She has no rales.  Abdominal: Soft. Normal appearance and bowel sounds are normal. There is no tenderness.  Musculoskeletal:       Right knee: She exhibits decreased range of motion, swelling and deformity. She exhibits no effusion, no ecchymosis, no bony tenderness and normal meniscus. Tenderness found. Medial joint line tenderness noted. No lateral joint line, no MCL, no LCL and no patellar tendon tenderness noted.       Left knee: She exhibits decreased range of motion, swelling and deformity. She exhibits no effusion, no bony tenderness and normal meniscus. Tenderness found. Medial joint line tenderness noted. No lateral joint line, no MCL, no LCL and no patellar tendon tenderness noted.  Neurological: She is  alert. She has normal strength. No sensory deficit. She exhibits normal muscle tone. Gait abnormal. Coordination normal.  Skin: Skin is warm, dry and intact. No rash noted.  Psychiatric: Her speech is normal and behavior is normal. Judgment and thought content normal. Her mood appears not anxious. Cognition and  memory are normal. She does not exhibit a depressed mood.          Assessment & Plan:

## 2015-01-18 NOTE — Progress Notes (Signed)
Pre visit review using our clinic review tool, if applicable. No additional management support is needed unless otherwise documented below in the visit note. 

## 2015-01-18 NOTE — Addendum Note (Signed)
Addended byEliezer Lofts E on: 01/18/2015 10:30 AM   Modules accepted: SmartSet

## 2015-01-18 NOTE — Assessment & Plan Note (Signed)
Stable, overdue for INR PT check.

## 2015-01-18 NOTE — Addendum Note (Signed)
Addended by: Carter Kitten on: 01/18/2015 10:32 AM   Modules accepted: Orders

## 2015-01-18 NOTE — Patient Instructions (Addendum)
Stop in lab on way out for INR.   Start trial of diclofenac gel on knees bilaterally.  Can also use tyelnol arthritis for pain. Can start glucosamine 500 mg three times  A day. OTC Call if knee pain not improving for referral for knee injections.

## 2015-01-18 NOTE — Assessment & Plan Note (Signed)
NSAIDs contraindicated on coumadin. Will try trial of voltaren gel Tramadol and oxyccodone caused SE  If not improving, refer for steroid injection.

## 2015-01-24 ENCOUNTER — Ambulatory Visit (INDEPENDENT_AMBULATORY_CARE_PROVIDER_SITE_OTHER): Payer: Commercial Managed Care - HMO | Admitting: *Deleted

## 2015-01-24 ENCOUNTER — Encounter: Payer: Self-pay | Admitting: Family Medicine

## 2015-01-24 ENCOUNTER — Ambulatory Visit (INDEPENDENT_AMBULATORY_CARE_PROVIDER_SITE_OTHER): Payer: Commercial Managed Care - HMO | Admitting: Family Medicine

## 2015-01-24 VITALS — BP 142/87 | HR 87 | Temp 98.4°F | Ht 64.0 in | Wt 184.2 lb

## 2015-01-24 DIAGNOSIS — M170 Bilateral primary osteoarthritis of knee: Secondary | ICD-10-CM

## 2015-01-24 DIAGNOSIS — D171 Benign lipomatous neoplasm of skin and subcutaneous tissue of trunk: Secondary | ICD-10-CM | POA: Diagnosis not present

## 2015-01-24 DIAGNOSIS — Z5181 Encounter for therapeutic drug level monitoring: Secondary | ICD-10-CM

## 2015-01-24 LAB — POCT INR: INR: 3.3

## 2015-01-24 MED ORDER — METHYLPREDNISOLONE ACETATE 40 MG/ML IJ SUSP
80.0000 mg | Freq: Once | INTRAMUSCULAR | Status: AC
  Administered 2015-01-24: 80 mg via INTRA_ARTICULAR

## 2015-01-24 MED ORDER — BENZONATATE 100 MG PO CAPS: ORAL_CAPSULE | ORAL | Status: DC

## 2015-01-24 NOTE — Progress Notes (Signed)
Pre visit review using our clinic review tool, if applicable. No additional management support is needed unless otherwise documented below in the visit note. 

## 2015-01-24 NOTE — Progress Notes (Signed)
Dr. Frederico Hamman T. Copland, MD, [facility] [place], [address] Phone: [phone] Fax: [phone]  01/24/2015  Patient: Angela Ali, MRN: [id number], DOB: [birth year], 81 y.o.  Primary Physician:  Eliezer Lofts, MD  Chief Complaint: Check Place on Back and Knee Pain  Subjective:   Angela Ali is a 81 y.o. very pleasant female patient who presents with the following:  R knee pain. Known R OA.  Balance is off some with walking. Does not use he walker. Otherwise is ok. She has been having some pain off and on for quite some time, and right now she is having pain mostly on the right knee.  She having some pain getting up and down from a seated position and trouble going up and down some stairs.  She is here with her daughter today.   Looking at some films from 2012, the patient does have at that point even some quite significant tricompartmental arthritis in the right.  Skin lesion on back - operated on in the 1980's. Lipoma  R knee injection  Past Medical History, Surgical History, Social History, Family History, Problem List, Medications, and Allergies have been reviewed and updated if relevant.  Patient Active Problem List   Diagnosis Date Noted  . Osteoarthritis of both knees 01/18/2015  . Acute sinus infection 09/27/2014  . Counseling regarding end of life decision making 09/07/2014  . Acute left lumbar radiculopathy 06/07/2014  . Encounter for therapeutic drug monitoring 06/05/2013  . Chronic diastolic CHF (congestive heart failure) 03/21/2013  . Chronic cough 02/03/2013  . Recurrent boils 06/22/2012  . Chronic constipation 12/01/2011  . 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 Splenic mass 08/21/2011  . Hx of Hematoma of leg, with chronic leg weakness and numbness 09/15/2010  . Asthma, mild intermittent 07/03/2009  . FATIGUE, CHRONIC 03/19/2009  . UNSPECIFIED VITAMIN D DEFICIENCY 02/26/2009  . LOW BACK PAIN, CHRONIC  11/19/2008  . OVERWEIGHT/OBESITY 10/02/2008  . CARDIOMYOPATHY, SECONDARY 10/02/2008  . Allergic rhinitis due to pollen 03/23/2008  . BENIGN NEOPLASM [place] DIGESTIVE SYSTEM 02/01/2007  . STRICTURE, ESOPHAGEAL 02/01/2007  . Diabetes mellitus type 2 with complications 58/59/2924  . Osteoporosis 12/21/2006  . Hyperlipemia 12/16/2006  . Essential hypertension, benign 12/16/2006  . Atrial fibrillation 12/16/2006  . Congestive heart failure 12/16/2006  . GERD 12/16/2006  . HIATAL HERNIA 12/16/2006  . OSTEOARTHRITIS 12/16/2006  . KNEE PAIN, RIGHT, CHRONIC 12/16/2006  . HERNIATED LUMBAR DISC 12/16/2006  . PERIPHERAL EDEMA 12/16/2006  . COLONIC POLYPS, HYPERPLASTIC 08/26/2000  . DIVERTICULOSIS, COLON 08/26/2000    Past Medical History  Diagnosis Date  . Overweight(278.02)   . Secondary cardiomyopathy, unspecified   . Atrial flutter   . Congestive heart failure, unspecified   . Edema   . Other and unspecified hyperlipidemia   . Unspecified essential hypertension   . Allergic rhinitis, cause unspecified   . Colonic polyp   . Diverticulosis of colon (without mention of hemorrhage)   . Other malaise and fatigue   . Stricture and stenosis of esophagus   . Bacterial pneumonia, unspecified   . Other abnormal glucose   . Esophageal reflux   . Osteoarthrosis, unspecified whether generalized or localized, unspecified site   . Displacement of lumbar intervertebral disc without myelopathy   . Diaphragmatic hernia without mention of obstruction or gangrene   . Long term (current) use of anticoagulants   . Atrial fibrillation   . Prediabetes   . Splenic mass 08/21/2011  .  Thigh abscess   . Diabetes mellitus without complication     borderline-checks sugars but no meds    Past Surgical History  Procedure Laterality Date  . Esophageal dilation  01/2007  . Abdominal hysterectomy  1971    partial  . Ankle surgery Left 2001  . Cholecystectomy  2004  . Colonoscopy    .  Polypectomy    . Spleen removed      2005 or 2006 in [place] History  . Marital Status: Unknown    Spouse Name: N/A  . Number of Children: 8  . Years of Education: N/A   Occupational History  . Retired Pharmacist, hospital     retired Pharmacist, hospital.  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 Smoking status: Never Smoker   . Smokeless tobacco: Never Used  . Alcohol Use: No  . Drug Use: No  . Sexual Activity: No   Other Topics Concern  . Not on file   Social History Narrative   Lost spouse to cancer in 07/2006   5 children   Does not get regular exercise       Family History  Problem Relation Age of Onset  . Emphysema Father 49  . Coronary artery disease Mother 7  . Hypertension Mother 59  . Hyperlipidemia Sister     # 1  . Hyperlipidemia Sister     # 2  . Diabetes Daughter     # 1  . Hypertension Daughter   . Diabetes Daughter     # 2   . Hypertension Daughter   . Ovarian cancer Daughter   . Bone cancer Son     bone marrow ca  . Colon cancer Neg Hx     Allergies  Allergen Reactions  . Morphine And Related   . Percocet [Oxycodone-Acetaminophen] Nausea Only       . Tramadol Nausea Only    Medication list reviewed and updated in full in [place].   GEN: No acute illnesses, no fevers, chills. GI: No n/v/d, eating normally Pulm: No SOB Interactive and getting along well at home.  Otherwise, ROS is as per the HPI.  Objective:   BP 142/87 mmHg  Pulse 87  Temp(Src) 98.4 F (36.9 C) (Oral)  Ht $R'5\' 4"'AF$  (1.626 m)  Wt 184 lb 4 oz (83.575 kg)  BMI 31.61 kg/m2  GEN: WDWN, NAD, Non-toxic, A & O x 3 HEENT: Atraumatic, Normocephalic. Neck supple. No masses, No LAD. Ears and Nose: No external deformity. CV: RRR, No M/G/R. No JVD. No thrill. No extra heart sounds. PULM: CTA B, no wheezes, crackles, rhonchi. No retractions. No resp. distress. No accessory muscle use. EXTR:  No c/c/e NEURO Normal gait.  PSYCH: Normally interactive. Conversant. Not depressed or anxious appearing.  Calm demeanor.   Right knee: Examined in the seated position.  Mild to moderate patellar crepitus.  Nontender on the patellar tendon.  Significant medial joint line tenderness greater than lateral joint line.  Stable MCL and LCL.  Drawer testing is negative.  McMurray's is negative.  Laboratory and Imaging Data: RIGHT KNEE - COMPLETE 4+ VIEW  Comparison: None  Findings: There is no joint effusion.  There is no fracture or dislocation identified.  Moderate to severe tricompartment osteoarthritis is identified.  There is mild chondrocalcinosis.  IMPRESSION:  1. No acute findings. 2. Tricompartment osteoarthritis.  Original Report Authenticated By:  [name], M.D.  Assessment and Plan:   Primary osteoarthritis of both knees  Lipoma of back  On Coumadin, reasonable to try joint injection for symptomatic relief.  I would not do anything regarding her lipoma. Reassured.   Knee Injection, R Patient verbally consented to procedure. Risks (including potential rare risk of infection), benefits, and alternatives explained. Sterilely prepped with Chloraprep. Ethyl cholride used for anesthesia. 8 cc Lidocaine 1% mixed with Depo-Medrol 80 mg injected using the anteromedial approach without difficulty. No complications with procedure and tolerated well. Patient had decreased pain post-injection.   Follow-up: prn  Signed,  Spencer T. Copland, MD   Patient's Medications  New Prescriptions   No medications on file  Previous Medications   ACCU-CHEK FASTCLIX LANCETS MISC    1 strip by Does not apply route 2 (two) times daily as needed. dx 250.90   ACETAMINOPHEN (TYLENOL ARTHRITIS PAIN) 650 MG CR TABLET    Take 650 mg by mouth daily.   ASCORBIC ACID (VITAMIN C PO)    Take 1 tablet by mouth daily.   ATORVASTATIN (LIPITOR) 80 MG TABLET    Take 1 tablet (80 mg total)  by mouth daily.   BENZONATATE (TESSALON) 100 MG CAPSULE    TAKE TWO CAPSULES BY MOUTH TWICE DAILY AS NEEDED FOR COUGH   BLOOD GLUCOSE MONITORING SUPPL (ACCU-CHEK NANO SMARTVIEW) W/DEVICE KIT    1 Device by Does not apply route 2 (two) times daily. Use device to check blood sugar dx 250.90   CHOLECALCIFEROL (VITAMIN D-3 PO)    Take 1 tablet by mouth daily.   CLONIDINE (CATAPRES) 0.1 MG TABLET    Take 0.5 tablets (0.05 mg total) by mouth 2 (two) times daily.   DICLOFENAC SODIUM (VOLTAREN) 1 % GEL    Apply 4 g topically 4 (four) times daily as needed (pain in left knee).   DIGOXIN (LANOXIN) 0.125 MG TABLET    TAKE 1 TABLET EVERY DAY   DILTIAZEM (CARDIZEM) 30 MG TABLET    Take 1 tablet (30 mg total) by mouth 4 (four) times daily as needed. For fast heart beat   FLUTICASONE (FLONASE) 50 MCG/ACT NASAL SPRAY    Place 2 sprays into the nose daily.   FUROSEMIDE (LASIX) 20 MG TABLET    Take 1 tablet (20 mg total) by mouth daily.   GLUCOSE BLOOD TEST STRIP    Use as instructed to check blood sugar 2 (two) times daily dx 250.90   HYDRALAZINE (APRESOLINE) 50 MG TABLET    TAKE 1 TABLET THREE TIMES DAILY   HYDROCHLOROTHIAZIDE (HYDRODIURIL) 25 MG TABLET    TAKE 1 TABLET (25 MG TOTAL) BY MOUTH DAILY.   LATANOPROST (XALATAN) 0.005 % OPHTHALMIC SOLUTION       LOSARTAN (COZAAR) 100 MG TABLET    TAKE 1 TABLET EVERY DAY   METOPROLOL SUCCINATE (TOPROL-XL) 100 MG 24 HR TABLET    TAKE 1 TABLET EVERY DAY   MONTELUKAST (SINGULAIR) 10 MG TABLET    Take 1 tablet (10 mg total) by mouth at bedtime.   OMEPRAZOLE (PRILOSEC) 20 MG CAPSULE    TAKE 1 CAPSULE TWICE DAILY BEFORE A MEAL   POTASSIUM CHLORIDE SA (K-DUR,KLOR-CON) 20 MEQ TABLET    TAKE 1 TABLET EVERY DAY   WARFARIN (COUMADIN) 2.5 MG TABLET    Take 2.5 mg by mouth See admin instructions. Take 2.5 mg on Tuesday, Thursday, and Saturdays (alternating with $RemoveBeforeDEI'5mg'ZmkqthAWrvYSGtvE$  on other days)   WARFARIN (COUMADIN) 5 MG TABLET    TAKE  1 TABLET ($RemoveB'5MG'NbxBOqFI$ ) ON MONDAY, WEDNESDAY, FRIDAY, SUNDAY AND TAKE  1/2 TABLET (2.$RemoveBefor'5MG'nCcPLOOngbLs$ ) ON OTHER DAYS  Modified Medications   No medications on file  Discontinued Medications   No medications on file

## 2015-02-07 ENCOUNTER — Ambulatory Visit (INDEPENDENT_AMBULATORY_CARE_PROVIDER_SITE_OTHER): Payer: Commercial Managed Care - HMO | Admitting: *Deleted

## 2015-02-07 ENCOUNTER — Other Ambulatory Visit (INDEPENDENT_AMBULATORY_CARE_PROVIDER_SITE_OTHER): Payer: Commercial Managed Care - HMO

## 2015-02-07 DIAGNOSIS — Z7902 Long term (current) use of antithrombotics/antiplatelets: Secondary | ICD-10-CM

## 2015-02-07 DIAGNOSIS — I4891 Unspecified atrial fibrillation: Secondary | ICD-10-CM

## 2015-02-07 DIAGNOSIS — Z5181 Encounter for therapeutic drug level monitoring: Secondary | ICD-10-CM

## 2015-02-07 DIAGNOSIS — Z452 Encounter for adjustment and management of vascular access device: Secondary | ICD-10-CM

## 2015-02-07 LAB — POCT INR: INR: 2.3

## 2015-02-07 NOTE — Progress Notes (Signed)
Pre visit review using our clinic review tool, if applicable. No additional management support is needed unless otherwise documented below in the visit note. 

## 2015-02-13 ENCOUNTER — Telehealth: Payer: Self-pay

## 2015-02-13 NOTE — Telephone Encounter (Signed)
Patient called and wants to know if it would be okay to take fish oil while she is taking coumadin? Thank you!

## 2015-02-14 NOTE — Telephone Encounter (Signed)
Patient is due for INR recheck on or around 03/07/15.  Patient notified and appointment scheduled.

## 2015-02-14 NOTE — Telephone Encounter (Signed)
Okay to use, no increase bleeding risk.

## 2015-02-14 NOTE — Telephone Encounter (Signed)
Angela Ali notified as instructed by telephone.  She is inquiring when she is due to come back in for her PT/INR.  Please advise.

## 2015-02-25 ENCOUNTER — Other Ambulatory Visit: Payer: Self-pay | Admitting: Cardiovascular Disease

## 2015-02-25 ENCOUNTER — Other Ambulatory Visit: Payer: Self-pay | Admitting: Family Medicine

## 2015-02-26 ENCOUNTER — Other Ambulatory Visit: Payer: Self-pay | Admitting: *Deleted

## 2015-03-07 ENCOUNTER — Ambulatory Visit (INDEPENDENT_AMBULATORY_CARE_PROVIDER_SITE_OTHER): Payer: Commercial Managed Care - HMO | Admitting: *Deleted

## 2015-03-07 DIAGNOSIS — I4891 Unspecified atrial fibrillation: Secondary | ICD-10-CM

## 2015-03-07 DIAGNOSIS — Z452 Encounter for adjustment and management of vascular access device: Secondary | ICD-10-CM

## 2015-03-07 DIAGNOSIS — Z7902 Long term (current) use of antithrombotics/antiplatelets: Secondary | ICD-10-CM | POA: Diagnosis not present

## 2015-03-07 DIAGNOSIS — Z5181 Encounter for therapeutic drug level monitoring: Secondary | ICD-10-CM

## 2015-03-07 LAB — POCT INR: INR: 2.3

## 2015-03-07 NOTE — Progress Notes (Signed)
Pre visit review using our clinic review tool, if applicable. No additional management support is needed unless otherwise documented below in the visit note. 

## 2015-04-04 ENCOUNTER — Ambulatory Visit (INDEPENDENT_AMBULATORY_CARE_PROVIDER_SITE_OTHER): Payer: Commercial Managed Care - HMO | Admitting: *Deleted

## 2015-04-04 DIAGNOSIS — I4891 Unspecified atrial fibrillation: Secondary | ICD-10-CM | POA: Diagnosis not present

## 2015-04-04 DIAGNOSIS — Z452 Encounter for adjustment and management of vascular access device: Secondary | ICD-10-CM

## 2015-04-04 DIAGNOSIS — Z5181 Encounter for therapeutic drug level monitoring: Secondary | ICD-10-CM

## 2015-04-04 DIAGNOSIS — Z7902 Long term (current) use of antithrombotics/antiplatelets: Secondary | ICD-10-CM

## 2015-04-04 LAB — POCT INR: INR: 2.9

## 2015-04-04 NOTE — Progress Notes (Signed)
Pre visit review using our clinic review tool, if applicable. No additional management support is needed unless otherwise documented below in the visit note. 

## 2015-04-24 ENCOUNTER — Other Ambulatory Visit: Payer: Self-pay | Admitting: Family Medicine

## 2015-04-24 NOTE — Telephone Encounter (Signed)
Last office visit 01/24/2015 with Dr. Lorelei Pont.  Last refilled 01/24/2015 for #120 with no refills.  Ok to refill?

## 2015-04-26 ENCOUNTER — Encounter: Payer: Self-pay | Admitting: Internal Medicine

## 2015-04-26 ENCOUNTER — Ambulatory Visit (INDEPENDENT_AMBULATORY_CARE_PROVIDER_SITE_OTHER): Payer: Commercial Managed Care - HMO | Admitting: Internal Medicine

## 2015-04-26 VITALS — BP 138/70 | HR 103 | Temp 97.8°F | Resp 16 | Wt 181.0 lb

## 2015-04-26 DIAGNOSIS — J0110 Acute frontal sinusitis, unspecified: Secondary | ICD-10-CM | POA: Insufficient documentation

## 2015-04-26 MED ORDER — HYDROCODONE-HOMATROPINE 5-1.5 MG/5ML PO SYRP: 5.0000 mL | ORAL_SOLUTION | Freq: Every evening | ORAL | Status: DC | PRN

## 2015-04-26 MED ORDER — AMOXICILLIN 500 MG PO TABS: 1000.0000 mg | ORAL_TABLET | Freq: Two times a day (BID) | ORAL | Status: DC

## 2015-04-26 NOTE — Progress Notes (Signed)
Subjective:    Patient ID: Angela Ali, female    DOB: [birth year], 81 y.o.   MRN: [id number]  HPI Here due to cough Wife daughter  Has been coughing Feels a lot of post nasal drip and drainage Trying to hawk it out--that makes her throat sore and pain under breasts Started about a week ago but really worsened 5 days ago Trying tea with honey and benzonatate--not really helping Cough is worst at night Some headache--esp with the cough. Maxillary No ear pain but they feel full (like with fluid) Slight SOB with lots of cough No fever Night sweat last night  Current Outpatient Prescriptions on File Prior to Visit  Medication Sig Dispense Refill  . ACCU-CHEK FASTCLIX LANCETS MISC 1 strip by Does not apply route 2 (two) times daily as needed. dx 250.90 306 each 1  . acetaminophen (TYLENOL ARTHRITIS PAIN) 650 MG CR tablet Take 650 mg by mouth daily.    . Ascorbic Acid (VITAMIN C PO) Take 1 tablet by mouth daily.    [place] Kitchen atorvastatin (LIPITOR) 80 MG tablet Take 1 tablet (80 mg total) by mouth daily. 90 tablet 3  . benzonatate (TESSALON) 100 MG capsule TAKE TWO CAPSULES BY MOUTH TWICE DAILY AS NEEDED FOR COUGH 120 capsule 0  . Cholecalciferol (VITAMIN D-3 PO) Take 1 tablet by mouth daily.    . cloNIDine (CATAPRES) 0.1 MG tablet Take 0.5 tablets (0.05 mg total) by mouth 2 (two) times daily. 60 tablet 6  . diclofenac sodium (VOLTAREN) 1 % GEL Apply 4 g topically 4 (four) times daily as needed (pain in left knee). 100 g 0  . digoxin (LANOXIN) 0.125 MG tablet TAKE 1 TABLET EVERY DAY 90 tablet 3  . diltiazem (CARDIZEM) 30 MG tablet Take 1 tablet (30 mg total) by mouth 4 (four) times daily as needed. For fast heart beat 120 tablet 0  . fluticasone (FLONASE) 50 MCG/ACT nasal spray Place 2 sprays into the nose daily. 48 g 1  . furosemide (LASIX) 20 MG tablet Take 1 tablet (20 mg total) by mouth daily. 30 tablet 3  . glucose blood test strip Use as instructed to check blood sugar 2 (two) times daily  dx 250.90 200 each 2  . hydrALAZINE (APRESOLINE) 50 MG tablet TAKE 1 TABLET THREE TIMES DAILY 270 tablet 3  . hydrochlorothiazide (HYDRODIURIL) 25 MG tablet TAKE 1 TABLET (25 MG TOTAL) BY MOUTH DAILY. 90 tablet 3  . latanoprost (XALATAN) 0.005 % ophthalmic solution     . losartan (COZAAR) 100 MG tablet TAKE 1 TABLET EVERY DAY 90 tablet 3  . metoprolol succinate (TOPROL-XL) 100 MG 24 hr tablet Take 1 tablet (100 mg total) by mouth daily. 90 tablet 1  . montelukast (SINGULAIR) 10 MG tablet Take 1 tablet (10 mg total) by mouth at bedtime. 30 tablet 3  . omeprazole (PRILOSEC) 20 MG capsule TAKE 1 CAPSULE TWICE DAILY BEFORE A MEAL 180 capsule 3  . potassium chloride SA (K-DUR,KLOR-CON) 20 MEQ tablet TAKE 1 TABLET EVERY DAY 90 tablet 3  . warfarin (COUMADIN) 5 MG tablet Take as directed by anti-coagulation clinic 90 tablet 0  . [DISCONTINUED] esomeprazole (NEXIUM) 40 MG capsule Take 40 mg by mouth daily.       No current facility-administered medications on file prior to visit.    Allergies  Allergen Reactions  . Morphine And Related   . Percocet [Oxycodone-Acetaminophen] Nausea Only       . Tramadol Nausea Only    Past Medical  History  Diagnosis Date  . Overweight(278.02)   . Secondary cardiomyopathy, unspecified   . Atrial flutter ([place])   . Congestive heart failure, unspecified   . Edema   . Other and unspecified hyperlipidemia   . Unspecified essential hypertension   . Allergic rhinitis, cause unspecified   . Colonic polyp   . Diverticulosis of colon (without mention of hemorrhage)   . Other malaise and fatigue   . Stricture and stenosis of esophagus   . Bacterial pneumonia, unspecified   . Other abnormal glucose   . Esophageal reflux   . Osteoarthrosis, unspecified whether generalized or localized, unspecified site   . Displacement of lumbar intervertebral disc without myelopathy   . Diaphragmatic hernia without mention of obstruction or gangrene   . Long term (current) use  of anticoagulants   . Atrial fibrillation ([place])   . Prediabetes   . Splenic mass 08/21/2011  . Thigh abscess   . Diabetes mellitus without complication (H[facility])     borderline-checks sugars but no meds    Past Surgical History  Procedure Laterality Date  . Esophageal dilation  01/2007  . Abdominal hysterectomy  1971    partial  . Ankle surgery Left 2001  . Cholecystectomy  2004  . Colonoscopy    . Polypectomy    . Spleen removed      2005 or 2006 in [place]    Family History  Problem Relation Age of Onset  . Emphysema Father 37  . Coronary artery disease Mother 29  . Hypertension Mother 42  . Hyperlipidemia Sister     # 1  . Hyperlipidemia Sister     # 2  . Diabetes Daughter     # 1  . Hypertension Daughter   . Diabetes Daughter     # 2   . Hypertension Daughter   . Ovarian cancer Daughter   . Bone cancer Son     bone marrow ca  . Colon cancer Neg Hx     Social History   Social History  . Marital Status: Unknown    Spouse Name: N/A  . Number of Children: 8  . Years of Education: N/A   Occupational History  . 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 Smoking status: Never Smoker   . Smokeless tobacco: Never Used  . Alcohol Use: No  . Drug Use: No  . Sexual Activity: No   Other Topics Concern  . Not on file   Social History Narrative   Lost spouse to cancer in 07/2006   5 children   Does not get regular exercise      Review of Systems Appetite is off--only eating soup No rash Chronic boils in groin (since age 87)--not too bad now No vomiting or diarrhea    Objective:   Physical Exam  Constitutional: She appears well-developed. No distress.  HENT:  Mouth/Throat: Oropharynx is clear and moist. No oropharyngeal exudate.  Mild frontal tenderness TMs normal Moderate nasal swelling  Neck: Normal range of motion. Neck supple.  Pulmonary/Chest:  Effort normal and breath sounds normal. No respiratory distress. She has no wheezes. She has no rales.  Lymphadenopathy:    She has no cervical adenopathy.          Assessment & Plan:

## 2015-04-26 NOTE — Assessment & Plan Note (Signed)
Likely secondary bacterial infection with worsening Will treat with amoxil Hycodan for cough--has tolerated this in past

## 2015-04-26 NOTE — Progress Notes (Signed)
Pre visit review using our clinic review tool, if applicable. No additional management support is needed unless otherwise documented below in the visit note. 

## 2015-05-02 ENCOUNTER — Ambulatory Visit: Payer: Commercial Managed Care - HMO

## 2015-05-06 ENCOUNTER — Ambulatory Visit: Payer: Commercial Managed Care - HMO

## 2015-05-07 ENCOUNTER — Telehealth: Payer: Self-pay | Admitting: Family Medicine

## 2015-05-07 ENCOUNTER — Ambulatory Visit: Payer: Commercial Managed Care - HMO

## 2015-05-07 NOTE — Telephone Encounter (Signed)
Coumadin clinic appointment rescheduled for 05/09/15.

## 2015-05-07 NOTE — Telephone Encounter (Signed)
Pt called and cancelled pt appt. Please call back to r/s. She states she was not able to leave you a vm. Thank you

## 2015-05-09 ENCOUNTER — Ambulatory Visit (INDEPENDENT_AMBULATORY_CARE_PROVIDER_SITE_OTHER): Payer: Commercial Managed Care - HMO | Admitting: *Deleted

## 2015-05-09 DIAGNOSIS — Z5181 Encounter for therapeutic drug level monitoring: Secondary | ICD-10-CM

## 2015-05-09 DIAGNOSIS — I4891 Unspecified atrial fibrillation: Secondary | ICD-10-CM | POA: Diagnosis not present

## 2015-05-09 DIAGNOSIS — Z452 Encounter for adjustment and management of vascular access device: Secondary | ICD-10-CM

## 2015-05-09 DIAGNOSIS — Z7902 Long term (current) use of antithrombotics/antiplatelets: Secondary | ICD-10-CM

## 2015-05-09 LAB — POCT INR: INR: 2.3

## 2015-05-09 NOTE — Progress Notes (Signed)
Pre visit review using our clinic review tool, if applicable. No additional management support is needed unless otherwise documented below in the visit note. 

## 2015-05-10 ENCOUNTER — Telehealth: Payer: Self-pay | Admitting: Family Medicine

## 2015-05-10 NOTE — Telephone Encounter (Signed)
Spoke with Angela Ali.  She wanted to know if it would be okay to take Zinc and Magnesium to boost her immune system.  Per Dr. Diona Browner, both supplements are okay for her to take.   Angela Ali made aware that Magnesium really won't boost her immune systems and may cause loose stools.  Patient states understanding.  She states she will just try the zinc.

## 2015-05-10 NOTE — Telephone Encounter (Signed)
Pt reqest cb - she has something important to relay to Dr Diona Browner  Thank you  cb number is [phone]

## 2015-05-15 ENCOUNTER — Other Ambulatory Visit: Payer: Self-pay | Admitting: Family Medicine

## 2015-05-15 ENCOUNTER — Other Ambulatory Visit: Payer: Self-pay | Admitting: Cardiovascular Disease

## 2015-05-20 ENCOUNTER — Telehealth: Payer: Self-pay | Admitting: Family Medicine

## 2015-05-20 NOTE — Telephone Encounter (Signed)
Pt called. Wants to know if she can take the medication Airborne with her coumadin and all other medication? Please advise   [phone]

## 2015-05-21 NOTE — Telephone Encounter (Signed)
Ms. Slate notified by telephone that it is okay for her to take Airborne with the other medications.

## 2015-05-21 NOTE — Telephone Encounter (Signed)
yes

## 2015-06-05 LAB — HM DIABETES EYE EXAM

## 2015-06-06 ENCOUNTER — Ambulatory Visit (INDEPENDENT_AMBULATORY_CARE_PROVIDER_SITE_OTHER): Payer: Commercial Managed Care - HMO | Admitting: *Deleted

## 2015-06-06 ENCOUNTER — Ambulatory Visit (INDEPENDENT_AMBULATORY_CARE_PROVIDER_SITE_OTHER): Payer: Commercial Managed Care - HMO | Admitting: Family Medicine

## 2015-06-06 ENCOUNTER — Encounter: Payer: Self-pay | Admitting: Family Medicine

## 2015-06-06 VITALS — BP 136/88 | HR 72 | Temp 98.3°F | Ht 64.0 in | Wt 183.2 lb

## 2015-06-06 DIAGNOSIS — D171 Benign lipomatous neoplasm of skin and subcutaneous tissue of trunk: Secondary | ICD-10-CM | POA: Diagnosis not present

## 2015-06-06 DIAGNOSIS — I4891 Unspecified atrial fibrillation: Secondary | ICD-10-CM | POA: Diagnosis not present

## 2015-06-06 DIAGNOSIS — Z7902 Long term (current) use of antithrombotics/antiplatelets: Secondary | ICD-10-CM

## 2015-06-06 DIAGNOSIS — Z452 Encounter for adjustment and management of vascular access device: Secondary | ICD-10-CM

## 2015-06-06 DIAGNOSIS — J4540 Moderate persistent asthma, uncomplicated: Secondary | ICD-10-CM | POA: Diagnosis not present

## 2015-06-06 DIAGNOSIS — R053 Chronic cough: Secondary | ICD-10-CM

## 2015-06-06 DIAGNOSIS — Z5181 Encounter for therapeutic drug level monitoring: Secondary | ICD-10-CM

## 2015-06-06 DIAGNOSIS — R05 Cough: Secondary | ICD-10-CM

## 2015-06-06 LAB — POCT INR: INR: 2

## 2015-06-06 MED ORDER — GLUCOSE BLOOD VI STRP: ORAL_STRIP | Status: DC

## 2015-06-06 MED ORDER — ALBUTEROL SULFATE (2.5 MG/3ML) 0.083% IN NEBU
2.5000 mg | INHALATION_SOLUTION | Freq: Once | RESPIRATORY_TRACT | Status: AC
  Administered 2015-06-06: 2.5 mg via RESPIRATORY_TRACT

## 2015-06-06 MED ORDER — FLUTICASONE-SALMETEROL 250-50 MCG/DOSE IN AEPB: 1.0000 | INHALATION_SPRAY | Freq: Two times a day (BID) | RESPIRATORY_TRACT | Status: DC

## 2015-06-06 MED ORDER — IPRATROPIUM BROMIDE 0.02 % IN SOLN
0.5000 mg | Freq: Once | RESPIRATORY_TRACT | Status: AC
  Administered 2015-06-06: 0.5 mg via RESPIRATORY_TRACT

## 2015-06-06 NOTE — Progress Notes (Signed)
INR remains therapeutic.  No changes in therapy.

## 2015-06-06 NOTE — Progress Notes (Signed)
Pre visit review using our clinic review tool, if applicable. No additional management support is needed unless otherwise documented below in the visit note. 

## 2015-06-06 NOTE — Progress Notes (Addendum)
   Subjective:    Patient ID: Angela Ali, female    DOB: [birth year], 82 y.o.   MRN: [id number]  HPI  82 year old female presents with  Several issues.  1. Continued cough: she was sick with bronchitis at christmas 1 month ago. Treated with antibiotics. Felt better but cough continued.  She has been using cough syrup hydrocodone cough syrup (makes her dizzy) and and benzonatate perles. Singulair  has not helped with allergies or cough. Flonase has not helped. Not on any other allergy med.  GERD controlled on prilosec.  no new swelling in ankles.  Seems to have improved gradually.  No wheeze, no shortness of breath unless walking a lot.  2. Has cyst on her back.. Now more itchy getting bigger. No heat, no discharge, no redness. Had I and D years ago in [place].  Review of Systems  Constitutional: Negative for fever and fatigue.  HENT: Negative for ear pain.   Eyes: Negative for pain.  Respiratory: Positive for cough and shortness of breath. Negative for chest tightness.   Cardiovascular: Negative for chest pain, palpitations and leg swelling.  Gastrointestinal: Negative for abdominal pain.  Genitourinary: Negative for dysuria.       Objective:   Physical Exam  Constitutional: Vital signs are normal. She appears well-developed and well-nourished. She is cooperative.  Non-toxic appearance. She does not appear ill. No distress.  HENT:  Head: Normocephalic.  Right Ear: Hearing, tympanic membrane, external ear and ear canal normal. Tympanic membrane is not erythematous, not retracted and not bulging.  Left Ear: Hearing, tympanic membrane, external ear and ear canal normal. Tympanic membrane is not erythematous, not retracted and not bulging.  Nose: No mucosal edema or rhinorrhea. Right sinus exhibits no maxillary sinus tenderness and no frontal sinus tenderness. Left sinus exhibits no maxillary sinus tenderness and no frontal sinus tenderness.  Mouth/Throat: Uvula is midline, oropharynx  is clear and moist and mucous membranes are normal.  Eyes: Conjunctivae, EOM and lids are normal. Pupils are equal, round, and reactive to light. Lids are everted and swept, no foreign bodies found.  Neck: Trachea normal and normal range of motion. Neck supple. Carotid bruit is not present. No thyroid mass and no thyromegaly present.  Cardiovascular: Normal rate, regular rhythm, S1 normal, S2 normal, normal heart sounds, intact distal pulses and normal pulses.  Exam reveals no gallop and no friction rub.   No murmur heard. Pulmonary/Chest: Effort normal and breath sounds normal. No tachypnea. No respiratory distress. She has no decreased breath sounds. She has no wheezes. She has no rhonchi. She has no rales.  Cough when trying to take deep breaths.  Abdominal: Soft. Normal appearance and bowel sounds are normal. There is no tenderness.  Neurological: She is alert.  Skin: Skin is warm, dry and intact. No rash noted.  Lipoma and scar on lower back  Psychiatric: Her speech is normal and behavior is normal. Judgment and thought content normal. Her mood appears not anxious. Cognition and memory are normal. She does not exhibit a depressed mood.    Pre and post albuterol spirometry performed. Improvement seen in obstruction per spirometry.      Assessment & Plan:

## 2015-06-06 NOTE — Assessment & Plan Note (Signed)
Worsened control. Likely cause of chronic cough that occurs for months after she gets sick.  Will start Advair discus twice daily. Follow up with spirometry in 4 weeks.

## 2015-06-06 NOTE — Patient Instructions (Addendum)
Stop Singulair if did not help.  Stay off hydrocodone/homatropine.  Start advair inhaler twice daily.  Use albuterol as needed for wheeze.  Follow up in 4 weeks for re-eval breathing/cough

## 2015-06-06 NOTE — Assessment & Plan Note (Addendum)
Likely due to asthma,   Post infectious bronchospasm. Pre and post albuterol spirometry performed and suggested asthma with improvement in obstruction with albuterol. She is on ARB and BBLocker. Could possibly be contributing. Per pt she does have times cough resolves and these meds are not new.  No sign of infeciton, no current GERD.  No red flags for CA.

## 2015-06-06 NOTE — Assessment & Plan Note (Signed)
Soft, mobile, no sign of infection, very superficial near no high risk structures. Pt reassure no treatment needed.

## 2015-06-10 ENCOUNTER — Telehealth: Payer: Self-pay

## 2015-06-10 NOTE — Telephone Encounter (Signed)
Pt left v/m; pt was seen 04/04/16; pt wants to know what cough med she can take OTC that will not interfere with other meds taking. Pt request cb.

## 2015-06-11 NOTE — Telephone Encounter (Signed)
Angela Ali notified by telephone that she can take OTC Mucinex DM per Dr. Diona Browner.

## 2015-06-11 NOTE — Telephone Encounter (Signed)
mucinex DM

## 2015-07-04 ENCOUNTER — Ambulatory Visit (INDEPENDENT_AMBULATORY_CARE_PROVIDER_SITE_OTHER): Payer: Commercial Managed Care - HMO | Admitting: *Deleted

## 2015-07-04 ENCOUNTER — Ambulatory Visit: Payer: Commercial Managed Care - HMO | Admitting: Family Medicine

## 2015-07-04 DIAGNOSIS — Z5181 Encounter for therapeutic drug level monitoring: Secondary | ICD-10-CM

## 2015-07-04 DIAGNOSIS — I4891 Unspecified atrial fibrillation: Secondary | ICD-10-CM

## 2015-07-04 DIAGNOSIS — Z452 Encounter for adjustment and management of vascular access device: Secondary | ICD-10-CM | POA: Diagnosis not present

## 2015-07-04 DIAGNOSIS — Z7902 Long term (current) use of antithrombotics/antiplatelets: Secondary | ICD-10-CM | POA: Diagnosis not present

## 2015-07-04 LAB — POCT INR: INR: 2

## 2015-07-04 NOTE — Progress Notes (Signed)
Pre visit review using our clinic review tool, if applicable. No additional management support is needed unless otherwise documented below in the visit note. 

## 2015-07-09 ENCOUNTER — Other Ambulatory Visit: Payer: Self-pay

## 2015-07-09 NOTE — Telephone Encounter (Signed)
Pt left v/m requesting rx cheratussin AC syrup. Call when ready for pick up. [facility] [address]last printed # [id number] on 05/10/14; pt last seen 06/06/15.

## 2015-07-10 NOTE — Telephone Encounter (Signed)
Not a great idea to be on codeine long term. I will send in rx for benzonatate for cough if she is interested . If infectious symtpoms or SOB pt needs to be seen.

## 2015-07-11 NOTE — Telephone Encounter (Signed)
Left message for Ms. Benak that Dr. Diona Browner denied her request for the Cheratussin AC cough syrup.  Advised not a great idea to be on codeine long term. I told her that Dr. Diona Browner would refill her benzonatate for cough if she is interested . If infectious symtpoms or SOB pt needs to be seen.

## 2015-07-25 ENCOUNTER — Telehealth: Payer: Self-pay | Admitting: Family Medicine

## 2015-07-25 NOTE — Telephone Encounter (Signed)
Form placed in Dr. Bedsole's in box to complete. 

## 2015-07-25 NOTE — Telephone Encounter (Signed)
Pt dropped off exempt forms for student loans she co-signed for her son to be completed by Dr. Diona Browner. She would like to get these filled out asap. Please call pt when ready for pick up. [phone].  Forms in [employer]

## 2015-07-26 DIAGNOSIS — Z7689 Persons encountering health services in other specified circumstances: Secondary | ICD-10-CM

## 2015-07-29 ENCOUNTER — Other Ambulatory Visit: Payer: Self-pay

## 2015-07-29 MED ORDER — BENZONATATE 100 MG PO CAPS: ORAL_CAPSULE | ORAL | Status: DC

## 2015-07-29 NOTE — Telephone Encounter (Signed)
Pt left v/m requesting refill tessalon to [facility] last filled # [phone] on 04/24/15. Last seen 06/06/15.Please advise.

## 2015-08-01 ENCOUNTER — Ambulatory Visit (INDEPENDENT_AMBULATORY_CARE_PROVIDER_SITE_OTHER): Payer: Commercial Managed Care - HMO | Admitting: *Deleted

## 2015-08-01 DIAGNOSIS — I4891 Unspecified atrial fibrillation: Secondary | ICD-10-CM | POA: Diagnosis not present

## 2015-08-01 DIAGNOSIS — Z7902 Long term (current) use of antithrombotics/antiplatelets: Secondary | ICD-10-CM | POA: Diagnosis not present

## 2015-08-01 DIAGNOSIS — Z5181 Encounter for therapeutic drug level monitoring: Secondary | ICD-10-CM | POA: Diagnosis not present

## 2015-08-01 DIAGNOSIS — Z452 Encounter for adjustment and management of vascular access device: Secondary | ICD-10-CM

## 2015-08-01 LAB — POCT INR: INR: 3.1

## 2015-08-01 NOTE — Progress Notes (Signed)
Pre visit review using our clinic review tool, if applicable. No additional management support is needed unless otherwise documented below in the visit note. 

## 2015-08-12 ENCOUNTER — Telehealth: Payer: Self-pay | Admitting: Family Medicine

## 2015-08-12 NOTE — Telephone Encounter (Signed)
Pt's singulair is not working.  Patient wants to know if she can get another allergy medication instead. [facility] [address]

## 2015-08-13 NOTE — Telephone Encounter (Signed)
Spoke with Angela Ali.  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  Please advise.

## 2015-08-13 NOTE — Telephone Encounter (Signed)
Have her try allegra 180 at bedtime daily for 2 weeks, if no improvement call.

## 2015-08-13 NOTE — Telephone Encounter (Signed)
Ms. Skidmore notified as instructed by telephone. 

## 2015-08-13 NOTE — Telephone Encounter (Signed)
Stop singulair. What has she tried in past? I believe claritin and zyrtec.Marland Kitchen Has she ever tried allegra  Or Xyzal?

## 2015-08-29 ENCOUNTER — Ambulatory Visit (INDEPENDENT_AMBULATORY_CARE_PROVIDER_SITE_OTHER): Payer: Commercial Managed Care - HMO | Admitting: *Deleted

## 2015-08-29 DIAGNOSIS — I4891 Unspecified atrial fibrillation: Secondary | ICD-10-CM | POA: Diagnosis not present

## 2015-08-29 DIAGNOSIS — Z5181 Encounter for therapeutic drug level monitoring: Secondary | ICD-10-CM

## 2015-08-29 DIAGNOSIS — Z7902 Long term (current) use of antithrombotics/antiplatelets: Secondary | ICD-10-CM | POA: Diagnosis not present

## 2015-08-29 DIAGNOSIS — Z452 Encounter for adjustment and management of vascular access device: Secondary | ICD-10-CM | POA: Diagnosis not present

## 2015-08-29 LAB — POCT INR: INR: 2.6

## 2015-08-29 NOTE — Progress Notes (Signed)
Pre visit review using our clinic review tool, if applicable. No additional management support is needed unless otherwise documented below in the visit note. 

## 2015-09-04 ENCOUNTER — Other Ambulatory Visit: Payer: Self-pay | Admitting: Family Medicine

## 2015-09-04 NOTE — Telephone Encounter (Signed)
Patient is asking for Butch Penny to call her back.  Patient is having back pain.

## 2015-09-04 NOTE — Telephone Encounter (Signed)
Spoke with Angela Ali.  She is asking for a refill on her cyclobenzaprine 5 mg.  She has chronic low back pain and went to PT last year for it.  She still is currently using a tens unit but would also like to have a low dose of a muscle relaxant.   Ok to refill?

## 2015-09-05 MED ORDER — CYCLOBENZAPRINE HCL 5 MG PO TABS: 5.0000 mg | ORAL_TABLET | Freq: Every day | ORAL | Status: DC

## 2015-09-05 NOTE — Telephone Encounter (Signed)
Left message for Ms. Maillet that the muscle relaxant prescription has been sent into her pharmacy.

## 2015-09-17 ENCOUNTER — Other Ambulatory Visit: Payer: Self-pay | Admitting: Cardiovascular Disease

## 2015-09-17 ENCOUNTER — Other Ambulatory Visit: Payer: Self-pay | Admitting: Family Medicine

## 2015-09-26 ENCOUNTER — Ambulatory Visit (INDEPENDENT_AMBULATORY_CARE_PROVIDER_SITE_OTHER): Payer: Commercial Managed Care - HMO | Admitting: Family Medicine

## 2015-09-26 ENCOUNTER — Encounter: Payer: Self-pay | Admitting: Family Medicine

## 2015-09-26 VITALS — BP 162/99 | HR 79 | Temp 97.8°F | Ht 64.0 in | Wt 184.0 lb

## 2015-09-26 DIAGNOSIS — M545 Low back pain: Secondary | ICD-10-CM | POA: Diagnosis not present

## 2015-09-26 DIAGNOSIS — M533 Sacrococcygeal disorders, not elsewhere classified: Secondary | ICD-10-CM | POA: Diagnosis not present

## 2015-09-26 LAB — POC URINALSYSI DIPSTICK (AUTOMATED)
BILIRUBIN UA: NEGATIVE
Blood, UA: NEGATIVE
GLUCOSE UA: NEGATIVE
Ketones, UA: NEGATIVE
LEUKOCYTES UA: NEGATIVE
NITRITE UA: NEGATIVE
PH UA: 6
Spec Grav, UA: 1.025
Urobilinogen, UA: 0.2

## 2015-09-26 MED ORDER — PREDNISONE 20 MG PO TABS: ORAL_TABLET | ORAL | Status: DC

## 2015-09-26 NOTE — Progress Notes (Signed)
Pre visit review using our clinic review tool, if applicable. No additional management support is needed unless otherwise documented below in the visit note. 

## 2015-09-26 NOTE — Patient Instructions (Addendum)
Complete prednisone taper. Heat on low back. Start home PT 3 times daily. Follow up in 2 weeks if not improving as expected.   Sacroiliac Joint Dysfunction Sacroiliac joint dysfunction is a condition that causes inflammation on one or both sides of the sacroiliac (SI) joint. The SI joint connects the lower part of the spine (sacrum) with the two upper portions of the pelvis (ilium). This condition causes deep aching or burning pain in the low back. In some cases, the pain may also spread into one or both buttocks or hips or spread down the legs. CAUSES This condition may be caused by:  Pregnancy. During pregnancy, extra stress is put on the SI joints because the pelvis widens.  Injury, such as:  Car accidents.  Sport-related injuries.  Work-related injuries.  Having one leg that is shorter than the other. Sometimes, the cause of SI joint dysfunction is not known. SYMPTOMS Symptoms of this condition include:  Aching or burning pain in the lower back. The pain may also spread to other areas, such as:  Buttocks.  Groin.  Thighs and legs.  Muscle spasms in or around the painful areas.  Increased pain when standing, walking, running, stair climbing, bending, or lifting. TREATMENT Treatment may vary depending on the cause and severity of your condition. Treatment options may include:  Applying ice or heat to the lower back area. This can help to reduce pain and muscle spasms.  Medicines to relieve pain or inflammation or to relax the muscles.  Wearing a back brace (sacroiliac brace) to help support the joint while your back is healing.  Physical therapy to increase muscle strength around the joint and flexibility at the joint. This may also involve learning proper body positions and ways of moving to relieve stress on the joint.  Direct manipulation of the SI joint.  Injections of steroid medicine into the joint in order to reduce pain and swelling.  Radiofrequency  ablation to burn away nerves that are carrying pain messages from the joint.  Use of a device that provides electrical stimulation in order to reduce pain at the joint.  Surgery to put in screws and plates that limit or prevent joint motion. This is rare. HOME CARE INSTRUCTIONS  Rest as needed. Limit your activities as directed by your health care provider.  Take medicines only as directed by your health care provider.  If directed, apply ice to the affected area:  Put ice in a plastic bag.  Place a towel between your skin and the bag.  Leave the ice on for 20 minutes, 2-3 times per day.  Use a heating pad or a moist heat pack as directed by your health care provider.  Exercise as directed by your health care provider or physical therapist.  Keep all follow-up visits as directed by your health care provider. This is important. SEEK MEDICAL CARE IF:  Your pain is not controlled with medicine.  You have a fever.  You have increasingly severe pain. SEEK IMMEDIATE MEDICAL CARE IF:  You have weakness, numbness, or tingling in your legs or feet.  You lose control of your bladder or bowel.   This information is not intended to replace advice given to you by your health care provider. Make sure you discuss any questions you have with your health care provider.   Document Released: 07/10/2008 Document Revised: 08/28/2014 Document Reviewed: 12/19/2013 Elsevier Interactive Patient Education [employer].

## 2015-09-26 NOTE — Progress Notes (Signed)
Subjective:    Patient ID: Angela Ali, female    DOB: [birth year], 82 y.o.   MRN: [id number]  HPI  82 year old female patient with history of chronic low back pain,osteoporosis DM, CHF. afib presents with increase in low back pain in last  2 weeks.  She feels like inwas triggered by bending over and lifting heavy water.  Pain is in low back on bilateral sides, pain put to lateral hip bilaterally. Increase in pain with straightening up. No radiation of pain to legs. No new numbness or weakness in legs. No dysuria, fever, no perineal numbness.  Using heat and ice pack.  flexeril makes her sleepy .  Tylenol arthrits does not help much.  UA clear.  MRI lumbar spine:06/2014: IMPRESSION:  1. At L3-4 there is a mild broad-based disc bulge with a left  foraminal disc protrusion with superior migration of disc material  with mass effect on the left L3 nerve root.  2. Lumbar spine spondylosis as described above. IMPRESSION:  Treated with pred taper and PT. Improved.  Social History /Family History/Past Medical History reviewed and updated if needed. No past surgery in back. Review of Systems  Constitutional: Negative for fever and fatigue.  HENT: Negative for ear pain.   Eyes: Negative for pain.  Respiratory: Negative for chest tightness and shortness of breath.   Cardiovascular: Negative for chest pain, palpitations and leg swelling.  Gastrointestinal: Negative for abdominal pain.  Genitourinary: Negative for dysuria.       Objective:   Physical Exam  Constitutional: Vital signs are normal. She appears well-developed and well-nourished. She is cooperative.  Non-toxic appearance. She does not appear ill. No distress.  HENT:  Head: Normocephalic.  Right Ear: Hearing, tympanic membrane, external ear and ear canal normal. Tympanic membrane is not erythematous, not retracted and not bulging.  Left Ear: Hearing, tympanic membrane, external ear and ear canal normal. Tympanic  membrane is not erythematous, not retracted and not bulging.  Nose: No mucosal edema or rhinorrhea. Right sinus exhibits no maxillary sinus tenderness and no frontal sinus tenderness. Left sinus exhibits no maxillary sinus tenderness and no frontal sinus tenderness.  Mouth/Throat: Uvula is midline, oropharynx is clear and moist and mucous membranes are normal.  Eyes: Conjunctivae, EOM and lids are normal. Pupils are equal, round, and reactive to light. Lids are everted and swept, no foreign bodies found.  Neck: Trachea normal and normal range of motion. Neck supple. Carotid bruit is not present. No thyroid mass and no thyromegaly present.  Cardiovascular: Normal rate, regular rhythm, S1 normal, S2 normal, normal heart sounds, intact distal pulses and normal pulses.  Exam reveals no gallop and no friction rub.   No murmur heard. Pulmonary/Chest: Effort normal and breath sounds normal. No tachypnea. No respiratory distress. She has no decreased breath sounds. She has no wheezes. She has no rhonchi. She has no rales.  Abdominal: Soft. Normal appearance and bowel sounds are normal. There is no tenderness.  Musculoskeletal:       Lumbar back: She exhibits decreased range of motion and tenderness. She exhibits no bony tenderness.       Back:  postive faber in SI joint, stiff in SI joint. ttp over SI joint bilaterally.  Neurological: She is alert.  Skin: Skin is warm, dry and intact. No rash noted.  Psychiatric: Her speech is normal and behavior is normal. Judgment and thought content normal. Her mood appears not anxious. Cognition and memory are normal. She does not exhibit  a depressed mood.          Assessment & Plan:

## 2015-09-26 NOTE — Assessment & Plan Note (Signed)
Start with home PT. Info give. Heat.  No clear sign of fracture even thought pt high risk.. No indication at this point for X-ray.  Start prednisone taper. Stop muscle relaxant.

## 2015-10-07 ENCOUNTER — Telehealth: Payer: Self-pay | Admitting: Family Medicine

## 2015-10-07 ENCOUNTER — Other Ambulatory Visit (INDEPENDENT_AMBULATORY_CARE_PROVIDER_SITE_OTHER): Payer: Commercial Managed Care - HMO

## 2015-10-07 DIAGNOSIS — M810 Age-related osteoporosis without current pathological fracture: Secondary | ICD-10-CM | POA: Diagnosis not present

## 2015-10-07 DIAGNOSIS — I4891 Unspecified atrial fibrillation: Secondary | ICD-10-CM | POA: Diagnosis not present

## 2015-10-07 DIAGNOSIS — Z7902 Long term (current) use of antithrombotics/antiplatelets: Secondary | ICD-10-CM

## 2015-10-07 DIAGNOSIS — Z452 Encounter for adjustment and management of vascular access device: Secondary | ICD-10-CM | POA: Diagnosis not present

## 2015-10-07 DIAGNOSIS — E118 Type 2 diabetes mellitus with unspecified complications: Secondary | ICD-10-CM

## 2015-10-07 DIAGNOSIS — E785 Hyperlipidemia, unspecified: Secondary | ICD-10-CM

## 2015-10-07 LAB — COMPREHENSIVE METABOLIC PANEL
ALBUMIN: 3.9 g/dL (ref 3.5–5.2)
ALT: 14 U/L (ref 0–35)
AST: 12 U/L (ref 0–37)
Alkaline Phosphatase: 58 U/L (ref 39–117)
BILIRUBIN TOTAL: 0.6 mg/dL (ref 0.2–1.2)
BUN: 18 mg/dL (ref 6–23)
CALCIUM: 10.2 mg/dL (ref 8.4–10.5)
CHLORIDE: 100 meq/L (ref 96–112)
CO2: 34 meq/L — AB (ref 19–32)
CREATININE: 0.92 mg/dL (ref 0.40–1.20)
GFR: 74.68 mL/min (ref >60.00)
Glucose, Bld: 113 mg/dL — ABNORMAL HIGH (ref 70–99)
Potassium: 4.1 mEq/L (ref 3.5–5.1)
Sodium: 139 mEq/L (ref 135–145)
Total Protein: 6.6 g/dL (ref 6.0–8.3)

## 2015-10-07 LAB — POCT INR: INR: 1.9

## 2015-10-07 LAB — LIPID PANEL
CHOLESTEROL: 184 mg/dL (ref 0–200)
HDL: 46.7 mg/dL (ref >39.00)
LDL Cholesterol: 112 mg/dL — ABNORMAL HIGH (ref 0–99)
NONHDL: 136.99
Total CHOL/HDL Ratio: 4
Triglycerides: 126 mg/dL (ref 0.0–149.0)
VLDL: 25.2 mg/dL (ref 0.0–40.0)

## 2015-10-07 LAB — HEMOGLOBIN A1C: HEMOGLOBIN A1C: 6.2 % (ref 4.6–6.5)

## 2015-10-07 LAB — VITAMIN D 25 HYDROXY (VIT D DEFICIENCY, FRACTURES): VITD: 34.64 ng/mL (ref 30.00–100.00)

## 2015-10-07 NOTE — Telephone Encounter (Signed)
-----   Message from Marchia Bond sent at 09/30/2015  3:00 PM EDT ----- Regarding: Cpx labs Mon 6/12, need orders. Thanks! :-) Please order  future cpx labs for pt's upcoming lab appt. Thanks Angela Ali

## 2015-10-09 ENCOUNTER — Encounter: Payer: Self-pay | Admitting: Family Medicine

## 2015-10-10 ENCOUNTER — Ambulatory Visit (INDEPENDENT_AMBULATORY_CARE_PROVIDER_SITE_OTHER): Payer: Commercial Managed Care - HMO | Admitting: Family Medicine

## 2015-10-10 ENCOUNTER — Encounter: Payer: Self-pay | Admitting: Family Medicine

## 2015-10-10 ENCOUNTER — Other Ambulatory Visit: Payer: Commercial Managed Care - HMO

## 2015-10-10 VITALS — BP 124/69 | HR 62 | Temp 97.8°F | Ht 63.0 in | Wt 186.8 lb

## 2015-10-10 DIAGNOSIS — E785 Hyperlipidemia, unspecified: Secondary | ICD-10-CM

## 2015-10-10 DIAGNOSIS — Z0000 Encounter for general adult medical examination without abnormal findings: Secondary | ICD-10-CM | POA: Diagnosis not present

## 2015-10-10 DIAGNOSIS — I10 Essential (primary) hypertension: Secondary | ICD-10-CM | POA: Diagnosis not present

## 2015-10-10 DIAGNOSIS — I5022 Chronic systolic (congestive) heart failure: Secondary | ICD-10-CM

## 2015-10-10 DIAGNOSIS — E118 Type 2 diabetes mellitus with unspecified complications: Secondary | ICD-10-CM | POA: Diagnosis not present

## 2015-10-10 DIAGNOSIS — I4891 Unspecified atrial fibrillation: Secondary | ICD-10-CM

## 2015-10-10 LAB — HM DIABETES FOOT EXAM

## 2015-10-10 NOTE — Patient Instructions (Addendum)
Get shingles vaccine at local pharmacy.  Stop at front desk for referral to podiatry.  Decrease any animal fats.. Decrease butter. Replace with veggie fats.  Can try Aquaphor for chapped lips.

## 2015-10-10 NOTE — Progress Notes (Signed)
I have personally reviewed the Medicare Annual Wellness questionnaire and have noted 1. The patient's medical and social history 2. Their use of alcohol, tobacco or illicit drugs 3. Their current medications and supplements 4. The patient's functional ability including ADL's, fall risks, home safety risks and hearing or visual             impairment. 5. Diet and physical activities 6. Evidence for depression or mood disorders 7.         Updated provider list Cognitive evaluation was performed and recorded on pt medicare questionnaire form. The patients weight, height, BMI and visual acuity have been recorded in the chart  I have made referrals, counseling and provided education to the patient based review of the above and I have provided the pt with a written personalized care plan for preventive services.     Back pain improved on prednisone, but still stiff. She is working on home stretching. No leg pain, numbness and orweakness.  GERD well controlled on current regimen on omeprazole.  Hypercholesterol: No longer at goal on current regimen of lipitor 80 mg Lab Results  Component Value Date   CHOL 184 10/07/2015   HDL 46.70 10/07/2015   LDLCALC 112* 10/07/2015   TRIG 126.0 10/07/2015   CHOLHDL 4 10/07/2015  Diet compliance: good Exercise; walking daily Wt Readings from Last 3 Encounters:  10/10/15 186 lb 12 oz (84.709 kg)  09/26/15 184 lb (83.462 kg)  06/06/15 183 lb 4 oz (83.122 kg)  Body mass index is 33.09 kg/(m^2).  Hypertension:well controlled on current regimen.  BP Readings from Last 3 Encounters:  10/10/15 124/69  09/26/15 162/99  06/06/15 136/88  Using medication without problems or lightheadedness: None  Chest pain with exertion:None   Edema:None  Short of breath:None  Average home BPs: 120/70s  Other issues:Afib, CHF: euvolemic   Diabetes: well controlled on no medicaiton Lab Results  Component Value Date   HGBA1C 6.2 10/07/2015  Hypoglycemic  episodes:None  Hyperglycemic episodes:None  Feet problems:None  Blood Sugars averaging: FBS: 104-111 eye exam within last year:04/2015  Social History /Family History/Past Medical History reviewed and updated if needed.   Review of Systems  Constitutional: Negative for fever and fatigue.  HENT: Negative for ear pain.  Eyes: Negative for pain.  Respiratory: Negative for chest tightness and shortness of breath.  Cardiovascular: Negative for chest pain, palpitations and leg swelling.  Gastrointestinal: Negative for abdominal pain.  Genitourinary: Negative for dysuria.  Neurological: Negative for syncope and light-headedness.  Psychiatric/Behavioral: No depression.  Objective:   Physical Exam  Constitutional: Vital signs are normal. She appears well-developed and well-nourished. She is cooperative. Non-toxic appearance. She does not appear ill. No distress.  HENT:  Head: Normocephalic.  Right Ear: Hearing, tympanic membrane, external ear and ear canal normal.  Left Ear: Hearing, tympanic membrane, external ear and ear canal normal.  Nose: nares clear. Eyes: Conjunctivae, EOM and lids are normal. Pupils are equal, round, and reactive to light. No foreign bodies found.  Neck: Trachea normal and normal range of motion. Neck supple. Carotid bruit is not present. No mass and no thyromegaly present.  Cardiovascular: Normal rate, S1 normal, S2 normal and intact distal pulses. An irregular rhythm present. Exam reveals distant heart sounds. Exam reveals no gallop.  No murmur heard. Slight B peripheral edema  Pulmonary/Chest: Effort normal and breath sounds normal. No respiratory distress. She has no wheezes. She has no rhonchi. She has no rales.  Abdominal: Soft. Normal appearance and bowel sounds are  normal. She exhibits no distension, no fluid wave, no abdominal bruit and no mass. There is no hepatosplenomegaly. There is no tenderness. There is no rebound, no guarding and  no CVA tenderness. No hernia.  Lymphadenopathy:  She has no cervical adenopathy.  She has no axillary adenopathy.  Neurological: She is alert. She has normal strength. No cranial nerve deficit or sensory deficit.  Skin: Skin is warm, dry and intact.  Psychiatric: Her speech is normal and behavior is normal. Judgment normal. Her mood appears not anxious. Cognition and memory are normal. She does not exhibit a depressed mood.  MSK: left knee, pain laterally, neg Mcmurray's, [pain with flexion, limping gait.  Diabetic foot exam:  Normal inspection  No skin breakdown  No calluses  Normal DP pulses  Normal sensation to light touch and monofilament  Nails thickened  Assessment & Plan:   AMW: The patient's preventative maintenance and recommended screening tests for an annual wellness exam were reviewed in full today.  Brought up to date unless services declined.  Counselled on the importance of diet, exercise, and its role in overall health and mortality.  The patient's FH and SH was reviewed, including their home life, tobacco status, and drug and alcohol status.   Vaccines:Uptodate with flu, PNA and Td, considering shingles vaccine,  No indication for pap, DVE given age, hysterectomy.  Mammo: No family history of first degree relative. She wishes to continue mammograms. 09/2015 stable Colon: Nml in 2009, per Dr. Deatra Ina, had nml cologuard in 05/2014. Nonsmoker.  DEXA: Stable osteopenia, 07/2014, Repeat 2-5 years.

## 2015-10-10 NOTE — Assessment & Plan Note (Signed)
Euvolemic on current medications.

## 2015-10-10 NOTE — Assessment & Plan Note (Signed)
Rate controlled on current meds. On coumadin anticoag.

## 2015-10-10 NOTE — Assessment & Plan Note (Signed)
Well controlled. Continue current medication.  

## 2015-10-10 NOTE — Assessment & Plan Note (Signed)
LDL no longer at goal on lipitor 80 mg daily. Encouraged pt to return to low chol diet as well as to exercise as tolerated. Will re-eval in 3 months to determine if additional medications needed.

## 2015-10-10 NOTE — Progress Notes (Signed)
Pre visit review using our clinic review tool, if applicable. No additional management support is needed unless otherwise documented below in the visit note. 

## 2015-10-29 ENCOUNTER — Other Ambulatory Visit: Payer: Self-pay | Admitting: Cardiovascular Disease

## 2015-10-29 ENCOUNTER — Other Ambulatory Visit: Payer: Self-pay | Admitting: Family Medicine

## 2015-10-31 ENCOUNTER — Telehealth: Payer: Self-pay | Admitting: Family Medicine

## 2015-10-31 NOTE — Telephone Encounter (Signed)
Pt returned your call Best number [phone]

## 2015-10-31 NOTE — Telephone Encounter (Signed)
See TCM call from earlier today in [facility] chart.

## 2015-11-11 ENCOUNTER — Other Ambulatory Visit (INDEPENDENT_AMBULATORY_CARE_PROVIDER_SITE_OTHER): Payer: Commercial Managed Care - HMO

## 2015-11-11 DIAGNOSIS — Z7902 Long term (current) use of antithrombotics/antiplatelets: Secondary | ICD-10-CM | POA: Diagnosis not present

## 2015-11-11 DIAGNOSIS — I4891 Unspecified atrial fibrillation: Secondary | ICD-10-CM

## 2015-11-11 DIAGNOSIS — Z452 Encounter for adjustment and management of vascular access device: Secondary | ICD-10-CM

## 2015-11-11 LAB — POCT INR: INR: 2

## 2015-11-25 ENCOUNTER — Encounter: Payer: Self-pay | Admitting: Cardiovascular Disease

## 2015-11-25 ENCOUNTER — Ambulatory Visit (INDEPENDENT_AMBULATORY_CARE_PROVIDER_SITE_OTHER): Payer: Commercial Managed Care - HMO | Admitting: Cardiovascular Disease

## 2015-11-25 VITALS — BP 140/72 | HR 54 | Ht 64.0 in | Wt 182.0 lb

## 2015-11-25 DIAGNOSIS — I4891 Unspecified atrial fibrillation: Secondary | ICD-10-CM | POA: Diagnosis not present

## 2015-11-25 DIAGNOSIS — I5032 Chronic diastolic (congestive) heart failure: Secondary | ICD-10-CM | POA: Diagnosis not present

## 2015-11-25 DIAGNOSIS — I429 Cardiomyopathy, unspecified: Secondary | ICD-10-CM

## 2015-11-25 DIAGNOSIS — E669 Obesity, unspecified: Secondary | ICD-10-CM | POA: Insufficient documentation

## 2015-11-25 DIAGNOSIS — E785 Hyperlipidemia, unspecified: Secondary | ICD-10-CM | POA: Diagnosis not present

## 2015-11-25 DIAGNOSIS — I10 Essential (primary) hypertension: Secondary | ICD-10-CM

## 2015-11-25 NOTE — Patient Instructions (Addendum)
Medication Instructions:   Please cut the metoprolol down to 1/2 pill  Only take potassium when you take lasix  Labwork:  No new labs   Testing/Procedures:  No new testing  Follow-Up: It was a pleasure seeing you in the office today. Please call us if you have new issues that need to be addressed before your next appt.  [phone]  Your physician wants you to follow-up in: 12 months.  You will receive a reminder letter in the mail two months in advance. If you don't receive a letter, please call our office to schedule the follow-up appointment.  If you need a refill on your cardiac medications before your next appointment, please call your pharmacy.

## 2015-11-25 NOTE — Progress Notes (Signed)
Cardiology Office Note  Date:  11/25/2015   ID:  Angela Ali, DOB Sep 21, 1928, MRN [id number]  PCP:  Eliezer Lofts, MD   Chief Complaint  Patient presents with  . Other    12 month follow up. Meds reviewed by the patient verbally. "doing well."     HPI:  Angela Ali is a very pleasant 82 year old woman with a history of chronic atrial fibrillation on warfarin, hypertension, chronic lower extremity edema, shortness of breath, who presents for routine followup of her atrial fibrillation.   History of fall with hematoma of the right lower extremity,  required care from the wound clinic for a large ulcerative wound on the right lateral aspect of her leg. This has healed well. History of medication confusion, some noncompliance. Prior trauma to her right lower extremity, surgery to her left ankle  In follow-up today, she reports that she is doing except has pain in her right knee Left ankle swelling from ankle surgery (2001) The Wound on her right LE has healed well, trace swelling  Feels tired sometimes, rate 54 bpm Takes lasix as needed , not daily Weight gain, eating lots of bread/rolls  Other complaint is that When she yawns, has left neck pain, rare, cramping Active, does not like to use her cane, no recent falls No regular exercise, goes to bed late Lab work reviewed in detail with her AIC 6.2 Total chol 184, LDL 112  EKG on today's visit shows atrial fibrillation, rate 54 bpm, nonspecific ST abnormality  Other past medical history reviewed Echo 08/2009 EF 45 to 50%   Calcium channel blocker/diltiazem was held in the past secondary to edema.    PMH:   has a past medical history of Allergic rhinitis, cause unspecified; Atrial fibrillation ([place]); Atrial flutter ([place]); Bacterial pneumonia, unspecified; Colonic polyp; Congestive heart failure, unspecified; Diabetes mellitus without complication ([place]); Diaphragmatic hernia without mention of obstruction or gangrene; Displacement of  lumbar intervertebral disc without myelopathy; Diverticulosis of colon (without mention of hemorrhage); Edema; Esophageal reflux; Long term (current) use of anticoagulants; Osteoarthrosis, unspecified whether generalized or localized, unspecified site; Other abnormal glucose; Other and unspecified hyperlipidemia; Other malaise and fatigue; Overweight(278.02); Prediabetes; Secondary cardiomyopathy, unspecified; Splenic mass (08/21/2011); Stricture and stenosis of esophagus; Thigh abscess; and Unspecified essential hypertension.  PSH:    Past Surgical History:  Procedure Laterality Date  . ABDOMINAL HYSTERECTOMY  1971   partial  . ANKLE SURGERY Left 2001  . CHOLECYSTECTOMY  2004  . COLONOSCOPY    . ESOPHAGEAL DILATION  01/2007  . POLYPECTOMY    . spleen removed     2005 or 2006 in [place]    Current Outpatient Prescriptions  Medication Sig Dispense Refill  . ACCU-CHEK FASTCLIX LANCETS MISC 1 strip by Does not apply route 2 (two) times daily as needed. dx 250.90 306 each 1  . acetaminophen (TYLENOL ARTHRITIS PAIN) 650 MG CR tablet Take 650 mg by mouth daily.    . Ascorbic Acid (VITAMIN C PO) Take 1 tablet by mouth daily.    Marland Kitchen atorvastatin (LIPITOR) 80 MG tablet Take 1 tablet (80 mg total) by mouth daily. 90 tablet 3  . benzonatate (TESSALON) 100 MG capsule TAKE TWO CAPSULES BY MOUTH TWICE DAILY AS NEEDED FOR COUGH 120 capsule 0  . Cholecalciferol (VITAMIN D-3 PO) Take 1 tablet by mouth daily.    . cloNIDine (CATAPRES) 0.1 MG tablet TAKE 1/2 TABLET TWICE DAILY 90 tablet 3  . cyclobenzaprine (FLEXERIL) 5 MG tablet Take 1 tablet (5  mg total) by mouth at bedtime. 30 tablet 0  . diclofenac sodium (VOLTAREN) 1 % GEL Apply 4 g topically 4 (four) times daily as needed (pain in left knee). 100 g 0  . digoxin (LANOXIN) 0.125 MG tablet TAKE 1 TABLET EVERY DAY 90 tablet 3  . fluticasone (FLONASE) 50 MCG/ACT nasal spray Place 2 sprays into the nose daily. 48 g 1  . Fluticasone-Salmeterol (ADVAIR  DISKUS) 250-50 MCG/DOSE AEPB Inhale 1 puff into the lungs 2 (two) times daily. 60 each 3  . furosemide (LASIX) 20 MG tablet Take 1 tablet (20 mg total) by mouth daily. 30 tablet 3  . glucose blood test strip Use as instructed to check blood sugar 2 (two) times daily dx E11.9 200 each 3  . hydrALAZINE (APRESOLINE) 50 MG tablet TAKE 1 TABLET THREE TIMES DAILY 270 tablet 3  . hydrochlorothiazide (HYDRODIURIL) 25 MG tablet TAKE 1 TABLET EVERY DAY 90 tablet 3  . latanoprost (XALATAN) 0.005 % ophthalmic solution     . losartan (COZAAR) 100 MG tablet TAKE 1 TABLET EVERY DAY 90 tablet 3  . metoprolol succinate (TOPROL-XL) 100 MG 24 hr tablet TAKE 1 TABLET EVERY DAY 90 tablet 1  . omeprazole (PRILOSEC) 20 MG capsule TAKE 1 CAPSULE TWICE DAILY BEFORE A MEAL 180 capsule 1  . potassium chloride SA (K-DUR,KLOR-CON) 20 MEQ tablet TAKE 1 TABLET EVERY DAY 90 tablet 3  . warfarin (COUMADIN) 5 MG tablet TAKE AS DIRECTED BY ANTI COAGULATION CLINIC 90 tablet 0   No current facility-administered medications for this visit.      Allergies:   Morphine and related; Percocet [oxycodone-acetaminophen]; and Tramadol   Social History:  The patient  reports that she has never smoked. She has never used smokeless tobacco. She reports that she does not drink alcohol or use drugs.   Family History:   family history includes Bone cancer in her son; Coronary artery disease (age of onset: 55) in her mother; Diabetes in her daughter and daughter; Emphysema (age of onset: 92) in her father; Hyperlipidemia in her sister and sister; Hypertension in her daughter and daughter; Hypertension (age of onset: 32) in her mother; Ovarian cancer in her daughter.    Review of Systems: Review of Systems  Constitutional: Negative.   Respiratory: Negative.   Cardiovascular: Negative.   Gastrointestinal: Negative.   Musculoskeletal: Positive for joint pain and myalgias.       Muscle cramping  Neurological: Negative.    Psychiatric/Behavioral: Negative.   All other systems reviewed and are negative.    PHYSICAL EXAM: VS:  BP 140/72 (BP Location: Left Arm, Patient Position: Sitting, Cuff Size: Normal)   Pulse (!) 54   Ht 5\' 4"  (1.626 m)   Wt 182 lb (82.6 kg)   BMI 31.24 kg/m  , BMI Body mass index is 31.24 kg/m. GEN: Well nourished, well developed, in no acute distress  HEENT: normal  Neck: no JVD, carotid bruits, or masses Cardiac: RRR; no murmurs, rubs, or gallops,no edema  Respiratory:  clear to auscultation bilaterally, normal work of breathing GI: soft, nontender, nondistended, + BS MS: no deformity or atrophy  Skin: warm and dry, no rash Neuro:  Strength and sensation are intact Psych: euthymic mood, full affect    Recent Labs: 10/07/2015: ALT 14; BUN 18; Creatinine, Ser 0.92; Potassium 4.1; Sodium 139    Lipid Panel Lab Results  Component Value Date   CHOL 184 10/07/2015   HDL 46.70 10/07/2015   LDLCALC 112 (H) 10/07/2015  TRIG 126.0 10/07/2015      Wt Readings from Last 3 Encounters:  11/25/15 182 lb (82.6 kg)  10/10/15 186 lb 12 oz (84.7 kg)  09/26/15 184 lb (83.5 kg)       ASSESSMENT AND PLAN:  Atrial fibrillation, unspecified type ([place]) - Plan: EKG 12-Lead Chronic atrial fibrillation, rate well controlled, tolerating anticoagulation on warfarin Heart rate low, we'll decrease her metoprolol down to 50 mg daily She does report some fatigue We'll continue digoxin, diltiazem, beta blocker at lower dose  Chronic diastolic CHF (congestive heart failure) ([place]) - Plan: EKG 12-Lead Using Lasix only as needed, weight is stable  Hypertension We'll continue current medications. Medications reviewed with her, lab work also reviewed as she is on HCTZ  Hyperlipidemia Reasonable cholesterol, tolerating high-dose Lipitor Seems to fluctuate with her weight  Morbid obesity Discussed with her in detail, recommended low carbohydrate diet Jump in her lipid panel, likely  secondary to poor diet     Total encounter time more than 25 minutes  Greater than 50% was spent in counseling and coordination of care with the patient     Disposition:   F/U  6 months   Orders Placed This Encounter  Procedures  . EKG 12-Lead     Signed, Esmond Plants, M.D., Ph.D. 11/25/2015  [place], [place]

## 2015-12-09 ENCOUNTER — Other Ambulatory Visit (INDEPENDENT_AMBULATORY_CARE_PROVIDER_SITE_OTHER): Payer: Commercial Managed Care - HMO

## 2015-12-09 DIAGNOSIS — I4891 Unspecified atrial fibrillation: Secondary | ICD-10-CM

## 2015-12-09 DIAGNOSIS — Z452 Encounter for adjustment and management of vascular access device: Secondary | ICD-10-CM

## 2015-12-09 DIAGNOSIS — Z7902 Long term (current) use of antithrombotics/antiplatelets: Secondary | ICD-10-CM

## 2015-12-09 DIAGNOSIS — Z5181 Encounter for therapeutic drug level monitoring: Secondary | ICD-10-CM

## 2015-12-09 LAB — POCT INR: INR: 3.6

## 2015-12-23 ENCOUNTER — Other Ambulatory Visit (INDEPENDENT_AMBULATORY_CARE_PROVIDER_SITE_OTHER): Payer: Commercial Managed Care - HMO

## 2015-12-23 DIAGNOSIS — Z7902 Long term (current) use of antithrombotics/antiplatelets: Secondary | ICD-10-CM

## 2015-12-23 DIAGNOSIS — Z452 Encounter for adjustment and management of vascular access device: Secondary | ICD-10-CM | POA: Diagnosis not present

## 2015-12-23 DIAGNOSIS — I4891 Unspecified atrial fibrillation: Secondary | ICD-10-CM

## 2015-12-23 LAB — POCT INR: INR: 2.9

## 2015-12-23 NOTE — Progress Notes (Signed)
Continue current dose and recheck in 2 weeks.

## 2015-12-30 ENCOUNTER — Other Ambulatory Visit: Payer: Self-pay | Admitting: Family Medicine

## 2016-01-06 ENCOUNTER — Other Ambulatory Visit (INDEPENDENT_AMBULATORY_CARE_PROVIDER_SITE_OTHER): Payer: Commercial Managed Care - HMO

## 2016-01-06 DIAGNOSIS — I4891 Unspecified atrial fibrillation: Secondary | ICD-10-CM

## 2016-01-06 DIAGNOSIS — Z7902 Long term (current) use of antithrombotics/antiplatelets: Secondary | ICD-10-CM | POA: Diagnosis not present

## 2016-01-06 DIAGNOSIS — Z5181 Encounter for therapeutic drug level monitoring: Secondary | ICD-10-CM | POA: Diagnosis not present

## 2016-01-06 DIAGNOSIS — Z452 Encounter for adjustment and management of vascular access device: Secondary | ICD-10-CM | POA: Diagnosis not present

## 2016-01-06 LAB — POCT INR: INR: 3.8

## 2016-01-13 ENCOUNTER — Other Ambulatory Visit: Payer: Self-pay | Admitting: Family Medicine

## 2016-01-20 ENCOUNTER — Other Ambulatory Visit (INDEPENDENT_AMBULATORY_CARE_PROVIDER_SITE_OTHER): Payer: Commercial Managed Care - HMO

## 2016-01-20 DIAGNOSIS — Z452 Encounter for adjustment and management of vascular access device: Secondary | ICD-10-CM

## 2016-01-20 DIAGNOSIS — E785 Hyperlipidemia, unspecified: Secondary | ICD-10-CM | POA: Diagnosis not present

## 2016-01-20 DIAGNOSIS — I4891 Unspecified atrial fibrillation: Secondary | ICD-10-CM

## 2016-01-20 DIAGNOSIS — Z7902 Long term (current) use of antithrombotics/antiplatelets: Secondary | ICD-10-CM | POA: Diagnosis not present

## 2016-01-20 LAB — HEPATIC FUNCTION PANEL
ALBUMIN: 4 g/dL (ref 3.5–5.2)
ALK PHOS: 80 U/L (ref 39–117)
ALT: 13 U/L (ref 0–35)
AST: 13 U/L (ref 0–37)
Bilirubin, Direct: 0 mg/dL (ref 0.0–0.3)
TOTAL PROTEIN: 7.1 g/dL (ref 6.0–8.3)
Total Bilirubin: 0.5 mg/dL (ref 0.2–1.2)

## 2016-01-20 LAB — LIPID PANEL
CHOLESTEROL: 181 mg/dL (ref 0–200)
HDL: 37.9 mg/dL — AB (ref >39.00)
LDL Cholesterol: 117 mg/dL — ABNORMAL HIGH (ref 0–99)
NonHDL: 143.51
TRIGLYCERIDES: 131 mg/dL (ref 0.0–149.0)
Total CHOL/HDL Ratio: 5
VLDL: 26.2 mg/dL (ref 0.0–40.0)

## 2016-01-20 LAB — POCT INR: INR: 2.6

## 2016-01-22 ENCOUNTER — Other Ambulatory Visit: Payer: Self-pay | Admitting: Family Medicine

## 2016-02-18 ENCOUNTER — Encounter: Payer: Self-pay | Admitting: Family Medicine

## 2016-02-18 ENCOUNTER — Ambulatory Visit (INDEPENDENT_AMBULATORY_CARE_PROVIDER_SITE_OTHER): Payer: Commercial Managed Care - HMO | Admitting: Family Medicine

## 2016-02-18 DIAGNOSIS — R222 Localized swelling, mass and lump, trunk: Secondary | ICD-10-CM | POA: Insufficient documentation

## 2016-02-18 NOTE — Assessment & Plan Note (Addendum)
Likely lipoma, but recurrent. No focal pain, or sign of infection. Pt concern level high.  No clear connection with episode of cramping in RLQ relieved with BM.  Recommended no clear need for further eval in pt but cannot rule out cancer completely with physical exam. Pt requests further imaging... Will eval with US>  Disucssed how  Risk of removal likely outweight benefit.. But will consider second opinion with surgeon if pt requests.

## 2016-02-18 NOTE — Patient Instructions (Signed)
Stop at front desk to set up [place] back mass

## 2016-02-18 NOTE — Progress Notes (Signed)
Subjective:    Patient ID: Angela Ali, female    DOB: 05/18/1928, 82 y.o.   MRN: [id number]  HPI    82 year old female  With history of anticoag given afib, well controlled DM presents with cyst/lipoma on back.   The lipoma or cyst returned.. It was removed years ago. She reports the lesion irritates her off and on. Itchy off and on. No redness, no discharge.   In last 3 days she has been achy in right lower abd. Pain has resolved now. She was worried pain may be coming from the cyst. No change in BMs but goes 3-4 times a day, looser, no blood in stool, no vomiting, no fever.  No dysuria, no change in frequency.  Pain in abd some better with BM, no change with food. No change with moving.  Pt and daughter are concerned about the are.. Request further imaging or some verification that it is not cancer.   Hx of spinal arthritis... Chronic back pain oof and on.  MRI lumbar spine.. Do not mention or do past CT abd/pelvis.  Review of Systems  Constitutional: Negative for fatigue and fever.  HENT: Negative for ear pain.   Eyes: Negative for pain.  Respiratory: Negative for chest tightness and shortness of breath.   Cardiovascular: Negative for chest pain, palpitations and leg swelling.  Gastrointestinal: Negative for abdominal pain.  Genitourinary: Negative for dysuria.       Objective:   Physical Exam  Constitutional: Vital signs are normal. She appears well-developed and well-nourished. She is cooperative.  Non-toxic appearance. She does not appear ill. No distress.  HENT:  Head: Normocephalic.  Right Ear: Hearing, tympanic membrane, external ear and ear canal normal. Tympanic membrane is not erythematous, not retracted and not bulging.  Left Ear: Hearing, tympanic membrane, external ear and ear canal normal. Tympanic membrane is not erythematous, not retracted and not bulging.  Nose: No mucosal edema or rhinorrhea. Right sinus exhibits no maxillary sinus tenderness and  no frontal sinus tenderness. Left sinus exhibits no maxillary sinus tenderness and no frontal sinus tenderness.  Mouth/Throat: Uvula is midline, oropharynx is clear and moist and mucous membranes are normal.  Eyes: Conjunctivae, EOM and lids are normal. Pupils are equal, round, and reactive to light. Lids are everted and swept, no foreign bodies found.  Neck: Trachea normal and normal range of motion. Neck supple. Carotid bruit is not present. No thyroid mass and no thyromegaly present.  Cardiovascular: Normal rate, regular rhythm, S1 normal, S2 normal, normal heart sounds, intact distal pulses and normal pulses.  Exam reveals no gallop and no friction rub.   No murmur heard. Pulmonary/Chest: Effort normal and breath sounds normal. No tachypnea. No respiratory distress. She has no decreased breath sounds. She has no wheezes. She has no rhonchi. She has no rales.  Abdominal: Soft. Normal appearance and bowel sounds are normal. There is no tenderness.  Musculoskeletal:       Lumbar back: She exhibits decreased range of motion. She exhibits no tenderness and no bony tenderness.       Back:  6 x 4 cm mass in right midback, soft, no redness,non mobile  Overlying lesion is scar from previous rempoval  Neurological: She is alert.  Skin: Skin is warm, dry and intact. No rash noted.  Psychiatric: Her speech is normal and behavior is normal. Judgment and thought content normal. Her mood appears not anxious. Cognition and memory are normal. She does not exhibit a depressed mood.  Assessment & Plan:

## 2016-02-18 NOTE — Progress Notes (Signed)
Pre visit review using our clinic review tool, if applicable. No additional management support is needed unless otherwise documented below in the visit note. 

## 2016-02-24 ENCOUNTER — Ambulatory Visit
Admission: RE | Admit: 2016-02-24 | Discharge: 2016-02-24 | Disposition: A | Discharge status: Home / Self Care | Payer: Commercial Managed Care - HMO | Source: Ambulatory Visit | Attending: Family Medicine | Admitting: Family Medicine

## 2016-02-24 DIAGNOSIS — R222 Localized swelling, mass and lump, trunk: Secondary | ICD-10-CM | POA: Diagnosis not present

## 2016-03-13 ENCOUNTER — Telehealth: Payer: Self-pay | Admitting: Family Medicine

## 2016-03-13 NOTE — Telephone Encounter (Signed)
Called and spoke with pt re: co pay fees for PT/ INR visits.   She understood that she would need to pay at each visit.  She may ask for a financial aid packet at her next visit.

## 2016-03-13 NOTE — Telephone Encounter (Signed)
Please call to explain this to her. Thanks!

## 2016-03-13 NOTE — Telephone Encounter (Signed)
Spoke with Angela Ali and advised she is past due for her PT/INR.  She is scheduled to see Dr. Diona Browner on 03/17/2016 at [facility] we will check her PT/INR on that day.  I told her we now had a nurse in the office that will be checking her PT/INRs again.  She ask if she can still continue getting her PT/INRs drawn in the lab.  She states when she sees the nurse for her PT/INR she has to pay a $10 co-pay but if she has is drawn in the lab she doesn't have to pay a co-pay and that is money she can save.

## 2016-03-13 NOTE — Telephone Encounter (Signed)
Pt called to see when she is due for her next coumadin ck. Last visit was in Sept. She is requesting a cb.

## 2016-03-17 ENCOUNTER — Ambulatory Visit (INDEPENDENT_AMBULATORY_CARE_PROVIDER_SITE_OTHER): Payer: Commercial Managed Care - HMO | Admitting: Family Medicine

## 2016-03-17 ENCOUNTER — Encounter: Payer: Self-pay | Admitting: Family Medicine

## 2016-03-17 DIAGNOSIS — Z452 Encounter for adjustment and management of vascular access device: Secondary | ICD-10-CM

## 2016-03-17 DIAGNOSIS — I4891 Unspecified atrial fibrillation: Secondary | ICD-10-CM | POA: Diagnosis not present

## 2016-03-17 DIAGNOSIS — Z7902 Long term (current) use of antithrombotics/antiplatelets: Secondary | ICD-10-CM | POA: Diagnosis not present

## 2016-03-17 DIAGNOSIS — J301 Allergic rhinitis due to pollen: Secondary | ICD-10-CM

## 2016-03-17 LAB — POCT INR: INR: 2.4

## 2016-03-17 MED ORDER — FLUTICASONE PROPIONATE 50 MCG/ACT NA SUSP: 2.0000 | Freq: Every day | NASAL | 1 refills | Status: DC

## 2016-03-17 MED ORDER — FLUTICASONE-SALMETEROL 250-50 MCG/DOSE IN AEPB
1.0000 | INHALATION_SPRAY | Freq: Two times a day (BID) | RESPIRATORY_TRACT | 3 refills | Status: DC
Start: 2016-03-17 — End: 2016-06-16

## 2016-03-17 NOTE — Progress Notes (Signed)
   Subjective:    Patient ID: Angela Ali, female    DOB: [birth year], 82 y.o.   MRN: [id number]  HPI   82 year old female with history of  Moderate persistent asthmsaafib on coumadin, chf and allergic rhinitis present with new onset cough.  She has noted daily post nasal drip, hoarseness, nasal congestion, clearing voice in 1-2 weeks. No fatigued, still doing activities, not worsening.  Dry cough. No fever. NO SOB, no wheeze.   She is using flonase , delsym,  Advair. Not on allergy med.   Singulair , zyrtec not helpful in past.   Review of Systems  Constitutional: Negative for fatigue and fever.  HENT: Negative for ear pain.   Eyes: Negative for pain.  Respiratory: Negative for chest tightness and shortness of breath.   Cardiovascular: Negative for chest pain, palpitations and leg swelling.  Gastrointestinal: Negative for abdominal pain.  Genitourinary: Negative for dysuria.       Objective:   Physical Exam  Constitutional: Vital signs are normal. She appears well-developed and well-nourished. She is cooperative.  Non-toxic appearance. She does not appear ill. No distress.  HENT:  Head: Normocephalic.  Right Ear: Hearing, tympanic membrane, external ear and ear canal normal. Tympanic membrane is not erythematous, not retracted and not bulging.  Left Ear: Hearing, tympanic membrane, external ear and ear canal normal. Tympanic membrane is not erythematous, not retracted and not bulging.  Nose: Mucosal edema and rhinorrhea present. Right sinus exhibits no maxillary sinus tenderness and no frontal sinus tenderness. Left sinus exhibits no maxillary sinus tenderness and no frontal sinus tenderness.  Mouth/Throat: Uvula is midline, oropharynx is clear and moist and mucous membranes are normal.  Eyes: Conjunctivae, EOM and lids are normal. Pupils are equal, round, and reactive to light. Lids are everted and swept, no foreign bodies found.  Neck: Trachea normal and normal range of  motion. Neck supple. Carotid bruit is not present. No thyroid mass and no thyromegaly present.  Cardiovascular: Normal rate, S1 normal, S2 normal, intact distal pulses and normal pulses.  An irregularly irregular rhythm present. Exam reveals no gallop, no distant heart sounds and no friction rub.   Murmur heard. Pulmonary/Chest: Effort normal and breath sounds normal. No tachypnea. No respiratory distress. She has no decreased breath sounds. She has no wheezes. She has no rhonchi. She has no rales.  Neurological: She is alert.  Skin: Skin is warm, dry and intact. No rash noted.  Psychiatric: Her speech is normal and behavior is normal. Judgment normal. Her mood appears not anxious. Cognition and memory are normal. She does not exhibit a depressed mood.          Assessment & Plan:

## 2016-03-17 NOTE — Addendum Note (Signed)
Addended by: Marchia Bond on: 03/17/2016 10:18 AM   Modules accepted: Orders

## 2016-03-17 NOTE — Assessment & Plan Note (Signed)
Add xyzal. No current sign of bacterial infection.

## 2016-03-17 NOTE — Progress Notes (Signed)
Pre visit review using our clinic review tool, if applicable. No additional management support is needed unless otherwise documented below in the visit note. 

## 2016-03-17 NOTE — Patient Instructions (Signed)
Start nasal saline spray 2-3 times daily.  Continue flonase 2 sprays per nostril daily   Start Xyzal at bedtime daily.  Continue advair as directed.

## 2016-04-06 ENCOUNTER — Other Ambulatory Visit: Payer: Self-pay | Admitting: Family Medicine

## 2016-04-14 ENCOUNTER — Ambulatory Visit: Payer: Commercial Managed Care - HMO

## 2016-04-17 ENCOUNTER — Ambulatory Visit (INDEPENDENT_AMBULATORY_CARE_PROVIDER_SITE_OTHER): Payer: Commercial Managed Care - HMO

## 2016-04-17 ENCOUNTER — Ambulatory Visit (INDEPENDENT_AMBULATORY_CARE_PROVIDER_SITE_OTHER): Payer: Commercial Managed Care - HMO | Admitting: Family Medicine

## 2016-04-17 ENCOUNTER — Encounter: Payer: Self-pay | Admitting: Family Medicine

## 2016-04-17 VITALS — BP 146/82 | HR 84 | Temp 98.4°F | Ht 63.0 in | Wt 184.8 lb

## 2016-04-17 DIAGNOSIS — Z5181 Encounter for therapeutic drug level monitoring: Secondary | ICD-10-CM | POA: Diagnosis not present

## 2016-04-17 DIAGNOSIS — I10 Essential (primary) hypertension: Secondary | ICD-10-CM | POA: Diagnosis not present

## 2016-04-17 DIAGNOSIS — M545 Low back pain: Secondary | ICD-10-CM

## 2016-04-17 DIAGNOSIS — G8929 Other chronic pain: Secondary | ICD-10-CM

## 2016-04-17 LAB — POCT INR: INR: 2.2

## 2016-04-17 MED ORDER — METOPROLOL SUCCINATE ER 50 MG PO TB24
50.0000 mg | ORAL_TABLET | Freq: Every day | ORAL | 3 refills | Status: DC
Start: 2016-04-17 — End: 2016-09-15

## 2016-04-17 MED ORDER — TRAMADOL HCL 50 MG PO TABS: 25.0000 mg | ORAL_TABLET | Freq: Three times a day (TID) | ORAL | 0 refills | Status: DC | PRN

## 2016-04-17 NOTE — Patient Instructions (Addendum)
If blood pressure remains high > 140/90  After pain in back improved... Call Dr. Rockey Situ about med changes.  Sent in new rx for lower dose of Toprol XL.  Please stop at the front desk or lab to set up referral or to have labs drawn.  Start tramadol as needed for pain... Take with food to avoid nausea.  Follow up if not improving.

## 2016-04-17 NOTE — Assessment & Plan Note (Signed)
Sent in lower dose toprol Xl so pt would not slit long acting med. BP slightly up.. If continues, pt to call Dr. Rockey Situ.

## 2016-04-17 NOTE — Patient Instructions (Signed)
Pre visit review using our clinic review tool, if applicable. No additional management support is needed unless otherwise documented below in the visit note. 

## 2016-04-17 NOTE — Progress Notes (Signed)
Pre visit review using our clinic review tool, if applicable. No additional management support is needed unless otherwise documented below in the visit note. 

## 2016-04-17 NOTE — Progress Notes (Signed)
   Subjective:    Patient ID: Angela Ali, female    DOB: 11/11/1928, 82 y.o.   MRN: [id number]  HPI   82 year old female pt with history of  Chronic low back pain, hx of herniated lumbar disc presents with increase in pain in low back.. Radiating down  Into both but cheeks.  She has had an increase of pain in low back.. More constant now in last few months.  No new fall, no change in activity.  BAck is stiff in AMs.   She ahs been doing home PT.. Back brace does not help much.   No new numbness, no tingling.   Tylenol does not help.  NSAIDs contraindicated given coumadin.   At last OV Dr. Rockey Situ told her to decrease Toprol XL to 1/2 tabs daily.. To help with fatigue.. She has trouble breaking. Request lower dose tablet. BP  At home has been 130-150/80-90.Marland Kitchen   BP Readings from Last 3 Encounters:  04/17/16 (!) 146/82  03/17/16 131/77  02/18/16 108/68    HR today 72    Review of Systems  Constitutional: Positive for fatigue. Negative for fever.  HENT: Negative for ear pain.   Eyes: Negative for pain.  Respiratory: Negative for chest tightness and shortness of breath.   Cardiovascular: Negative for chest pain, palpitations and leg swelling.  Gastrointestinal: Negative for abdominal pain.  Genitourinary: Negative for dysuria.       Objective:   Physical Exam  Constitutional: Vital signs are normal. She appears well-developed and well-nourished. She is cooperative.  Non-toxic appearance. She does not appear ill. No distress.  HENT:  Head: Normocephalic.  Right Ear: Hearing, tympanic membrane, external ear and ear canal normal. Tympanic membrane is not erythematous, not retracted and not bulging.  Left Ear: Hearing, tympanic membrane, external ear and ear canal normal. Tympanic membrane is not erythematous, not retracted and not bulging.  Nose: No mucosal edema or rhinorrhea. Right sinus exhibits no maxillary sinus tenderness and no frontal sinus tenderness. Left sinus  exhibits no maxillary sinus tenderness and no frontal sinus tenderness.  Mouth/Throat: Uvula is midline, oropharynx is clear and moist and mucous membranes are normal.  Eyes: Conjunctivae, EOM and lids are normal. Pupils are equal, round, and reactive to light. Lids are everted and swept, no foreign bodies found.  Neck: Trachea normal and normal range of motion. Neck supple. Carotid bruit is not present. No thyroid mass and no thyromegaly present.  Cardiovascular: Normal rate, regular rhythm, S1 normal, S2 normal, normal heart sounds, intact distal pulses and normal pulses.  Exam reveals no gallop and no friction rub.   No murmur heard. Pulmonary/Chest: Effort normal and breath sounds normal. No tachypnea. No respiratory distress. She has no decreased breath sounds. She has no wheezes. She has no rhonchi. She has no rales.  Abdominal: Soft. Normal appearance and bowel sounds are normal. There is no tenderness.  Musculoskeletal:       Lumbar back: She exhibits tenderness. She exhibits no bony tenderness.  Neurological: She is alert. She has normal strength. No cranial nerve deficit or sensory deficit. Gait abnormal.   Neg SLR bilaterally, neg faber's  Skin: Skin is warm, dry and intact. No rash noted.  Psychiatric: Her speech is normal and behavior is normal. Judgment and thought content normal. Her mood appears not anxious. Cognition and memory are normal. She does not exhibit a depressed mood.          Assessment & Plan:

## 2016-04-17 NOTE — Assessment & Plan Note (Signed)
Reviewed 2016 MRI. NSAIDs contraindicated. No acute changes... Will try trial of low dose tramadol with food to avoid past nausea. Refer to PT. If not improving refer to back specialist for further treatment.

## 2016-05-12 ENCOUNTER — Ambulatory Visit (INDEPENDENT_AMBULATORY_CARE_PROVIDER_SITE_OTHER): Payer: Medicare PPO | Admitting: Family Medicine

## 2016-05-12 ENCOUNTER — Encounter: Payer: Self-pay | Admitting: Family Medicine

## 2016-05-12 ENCOUNTER — Ambulatory Visit: Payer: Commercial Managed Care - HMO | Admitting: Family Medicine

## 2016-05-12 DIAGNOSIS — R05 Cough: Secondary | ICD-10-CM | POA: Diagnosis not present

## 2016-05-12 DIAGNOSIS — R059 Cough, unspecified: Secondary | ICD-10-CM | POA: Insufficient documentation

## 2016-05-12 MED ORDER — DOXYCYCLINE HYCLATE 100 MG PO TABS: 100.0000 mg | ORAL_TABLET | Freq: Two times a day (BID) | ORAL | 0 refills | Status: DC

## 2016-05-12 NOTE — Progress Notes (Signed)
Pre visit review using our clinic review tool, if applicable. No additional management support is needed unless otherwise documented below in the visit note. 

## 2016-05-12 NOTE — Progress Notes (Signed)
   Subjective:    Patient ID: Angela Ali, female    DOB: [birth year], 83 y.o.   MRN: [id number]  Cough  This is a new problem. The current episode started in the past 7 days. The problem has been gradually worsening. The cough is productive of sputum and productive of purulent sputum. Associated symptoms include nasal congestion and a sore throat. Pertinent negatives include no chest pain, chills, ear pain, fever, headaches, heartburn, postnasal drip, shortness of breath or wheezing. Associated symptoms comments: Fatigue and weakness  Nasal congestion. The symptoms are aggravated by lying down (cough keeping her up at night). Risk factors: nonsmoker. She has tried OTC cough suppressant for the symptoms. The treatment provided mild relief. Her past medical history is significant for asthma and environmental allergies. There is no history of COPD.    Hx of moderate persistent asthma and allergies, CHF and chronic cough  BP running higher given cardiology decrease bblocker for SE of fatigue.  Review of Systems  Constitutional: Negative for chills and fever.  HENT: Positive for sore throat. Negative for ear pain and postnasal drip.   Respiratory: Positive for cough. Negative for shortness of breath and wheezing.   Cardiovascular: Negative for chest pain.  Gastrointestinal: Negative for heartburn.  Allergic/Immunologic: Positive for environmental allergies.  Neurological: Negative for headaches.       Objective:   Physical Exam  Constitutional: Vital signs are normal. She appears well-developed and well-nourished. She is cooperative.  Non-toxic appearance. She does not appear ill. No distress.  HENT:  Head: Normocephalic.  Right Ear: Hearing, tympanic membrane, external ear and ear canal normal. Tympanic membrane is not erythematous, not retracted and not bulging.  Left Ear: Hearing, tympanic membrane, external ear and ear canal normal. Tympanic membrane is not erythematous, not retracted  and not bulging.  Nose: Mucosal edema and rhinorrhea present. Right sinus exhibits no maxillary sinus tenderness and no frontal sinus tenderness. Left sinus exhibits no maxillary sinus tenderness and no frontal sinus tenderness.  Mouth/Throat: Uvula is midline, oropharynx is clear and moist and mucous membranes are normal.  Eyes: Conjunctivae, EOM and lids are normal. Pupils are equal, round, and reactive to light. Lids are everted and swept, no foreign bodies found.  Neck: Trachea normal and normal range of motion. Neck supple. Carotid bruit is not present. No thyroid mass and no thyromegaly present.  Cardiovascular: Normal rate, regular rhythm, S1 normal, S2 normal, normal heart sounds, intact distal pulses and normal pulses.  Exam reveals no gallop and no friction rub.   No murmur heard. Pulmonary/Chest: Effort normal and breath sounds normal. No tachypnea. No respiratory distress. She has no rhonchi. She has no rales.  frequent moist cough  Neurological: She is alert.  Skin: Skin is warm, dry and intact. No rash noted.  Psychiatric: Her speech is normal and behavior is normal. Judgment normal. Her mood appears not anxious. Cognition and memory are normal. She does not exhibit a depressed mood.          Assessment & Plan:

## 2016-05-12 NOTE — Patient Instructions (Addendum)
Complete antibiotics. Keep appt for INR/PT check on Friday.  Delsym twice daily or mucinex DM for cough as needed.  Expect 7-10 days of illness and call if not improving some in 5-7 days. Go to ER severe shortness of breath.

## 2016-05-15 ENCOUNTER — Ambulatory Visit: Payer: Commercial Managed Care - HMO

## 2016-05-19 ENCOUNTER — Ambulatory Visit (INDEPENDENT_AMBULATORY_CARE_PROVIDER_SITE_OTHER): Payer: Medicare PPO

## 2016-05-19 ENCOUNTER — Telehealth: Payer: Self-pay

## 2016-05-19 DIAGNOSIS — Z5181 Encounter for therapeutic drug level monitoring: Secondary | ICD-10-CM

## 2016-05-19 LAB — POCT INR: INR: 2.6

## 2016-05-19 NOTE — Telephone Encounter (Signed)
Has she tried mucinex or tessalon perles this time? If so not much other great options except can sue guaf with codeine at night. Let me know.

## 2016-05-19 NOTE — Progress Notes (Signed)
Stable on antibiotics.

## 2016-05-19 NOTE — Telephone Encounter (Signed)
Pt was seen 05/12/16 and Delsym is not helping cough; pt continues with prod cough with clear phlegm,no fever, wheezing, SOB or CP. Pt is taking singulair. Pt request different cough med. Pt request cb.[place]

## 2016-05-19 NOTE — Patient Instructions (Signed)
Pre visit review using our clinic review tool, if applicable. No additional management support is needed unless otherwise documented below in the visit note. 

## 2016-05-20 NOTE — Telephone Encounter (Signed)
Spoke with Angela Ali.  She states she is taking the Delsym but has not tried the Mucinex DM.  She states she still has some Hycodan cough syrup at home that was prescribed for her in June 2017.   I advised to stop the Delsym.  Try taking the Mucinex DM for during the day and can use the Hycodan for night time.  Patient is in agreement with plan.

## 2016-05-29 ENCOUNTER — Telehealth: Payer: Self-pay | Admitting: Family Medicine

## 2016-05-29 ENCOUNTER — Ambulatory Visit: Payer: Medicare PPO

## 2016-05-29 NOTE — Telephone Encounter (Signed)
Pt called wanting to know if she could come in today between 3-3:30 she could not come today @ 10:15

## 2016-05-29 NOTE — Telephone Encounter (Signed)
Spoke with patient.  Unable to check this afternoon but did reschedule her for Tuesday, 06/02/16 at 3:15pm.  Patient verbalizes understanding.

## 2016-06-02 ENCOUNTER — Ambulatory Visit (INDEPENDENT_AMBULATORY_CARE_PROVIDER_SITE_OTHER): Payer: Medicare PPO

## 2016-06-02 DIAGNOSIS — Z5181 Encounter for therapeutic drug level monitoring: Secondary | ICD-10-CM

## 2016-06-02 LAB — POCT INR: INR: 2.6

## 2016-06-02 NOTE — Patient Instructions (Signed)
Pre visit review using our clinic review tool, if applicable. No additional management support is needed unless otherwise documented below in the visit note. 

## 2016-06-16 ENCOUNTER — Telehealth: Payer: Self-pay

## 2016-06-16 ENCOUNTER — Other Ambulatory Visit: Payer: Self-pay | Admitting: Family Medicine

## 2016-06-16 ENCOUNTER — Other Ambulatory Visit: Payer: Self-pay | Admitting: Cardiovascular Disease

## 2016-06-16 NOTE — Telephone Encounter (Signed)
Pt cannot pay for the Advair; too expensive. Pt request substitute med to [facility] [address]. Pt will ck with ins co and cb with more affordable substitutes for Advair.

## 2016-06-17 ENCOUNTER — Telehealth: Payer: Self-pay | Admitting: *Deleted

## 2016-06-17 NOTE — Telephone Encounter (Signed)
Received fax from [facility] requested PA for Advair 250/50.  PA completed on CoverMyMeds.  Under review.  Will receive decision in 24 to 72 hours.

## 2016-06-17 NOTE — Telephone Encounter (Signed)
Patient is compliant with coumadin management. Will refill X 3 months. 

## 2016-06-18 ENCOUNTER — Telehealth: Payer: Self-pay | Admitting: *Deleted

## 2016-06-18 MED ORDER — FLUTICASONE FUROATE-VILANTEROL 200-25 MCG/INH IN AEPB: 1.0000 | INHALATION_SPRAY | Freq: Every day | RESPIRATORY_TRACT | 3 refills | Status: DC

## 2016-06-18 NOTE — Telephone Encounter (Signed)
Received fax from [facility] requesting PA for Advair.  Formulary inhalers are Symbicort HFA and Breo Ellipta.  Spoke with Ms. Copper and she is okay with changing her inhaler to one that is on formulary.  Will forward to Dr. Diona Browner to prescribe a different inhaler

## 2016-06-30 ENCOUNTER — Telehealth: Payer: Self-pay | Admitting: Family Medicine

## 2016-06-30 NOTE — Telephone Encounter (Signed)
Patient appointment rescheduled with coumadin clinic for 07/01/16 per her request. Patient aware.

## 2016-06-30 NOTE — Telephone Encounter (Signed)
Pt called - she wants to know if you could do her pt tomorrow when she brings in her daughter to see dr copland so she doesn't have to do so much running around.    Thanks

## 2016-07-01 ENCOUNTER — Ambulatory Visit (INDEPENDENT_AMBULATORY_CARE_PROVIDER_SITE_OTHER): Payer: Medicare PPO

## 2016-07-01 DIAGNOSIS — Z5181 Encounter for therapeutic drug level monitoring: Secondary | ICD-10-CM | POA: Diagnosis not present

## 2016-07-01 LAB — POCT INR: INR: 3

## 2016-07-01 NOTE — Patient Instructions (Addendum)
Pre visit review using our clinic review tool, if applicable. No additional management support is needed unless otherwise documented below in the visit note.  While patient in for coumadin check this am, she reports feeling jittery, shaky and is noticeably unsteady on her feet.  INR today is 3.0.  Further assessment of symptoms reveals that patient is a diabetic and did not eat breakfast this am resulting in s/s of hypoglycemia.  Patient given peanut butter crackers and water (as she refused juice drink or soda) and watched carefully for several minutes following coumadin visit.  Shaking ceased and patient reports feeling better.  FSBS after food supplementation: 118.  Patient denies any other symptoms and is visibly more steady on her feet when up and walking.  She is educated on risks associated with not eating as she is diabetic and strongly encouraged to not do that again. In addition, she is encouraged by this writer to use a walker that her family has bought her but she leaves at home.  Patient verbalizes understanding of all instructions given today and states that she feels fine to drive and leave at this point.

## 2016-07-03 ENCOUNTER — Ambulatory Visit: Payer: Medicare PPO

## 2016-07-06 ENCOUNTER — Other Ambulatory Visit: Payer: Self-pay | Admitting: Family Medicine

## 2016-07-11 ENCOUNTER — Other Ambulatory Visit: Payer: Self-pay | Admitting: Family Medicine

## 2016-07-28 ENCOUNTER — Telehealth: Payer: Self-pay | Admitting: *Deleted

## 2016-07-28 ENCOUNTER — Ambulatory Visit (INDEPENDENT_AMBULATORY_CARE_PROVIDER_SITE_OTHER): Payer: Medicare PPO

## 2016-07-28 DIAGNOSIS — Z5181 Encounter for therapeutic drug level monitoring: Secondary | ICD-10-CM

## 2016-07-28 LAB — POCT INR: INR: 2.5

## 2016-07-28 NOTE — Patient Instructions (Signed)
Pre visit review using our clinic review tool, if applicable. No additional management support is needed unless otherwise documented below in the visit note. 

## 2016-07-28 NOTE — Telephone Encounter (Signed)
Angela Ali came in today for her PT/INR check.  While here she ask to speak with me.  She states the Breo inhaler was going to cost her $400.00. While in the office I did give her a coupon we had but patient called back to say the coupon was expired.  Date on coupon is 04/26/2017.  Then [name] said not to worry about the coupon because it was only going to save her just a little bit.  Wants to know if there is a cheaper inhaler Dr. Diona Browner could prescribe for her.  Her insurance did not cover Advair so her inhaler was changed to [facility] because it was on her formulary.  Will forward to Dr. Diona Browner to advise.

## 2016-07-28 NOTE — Telephone Encounter (Signed)
I type in other options  but they all seem to be the same Tier as Breo.. Tier 3. Have her call her insurance for their suggestions.

## 2016-07-29 NOTE — Telephone Encounter (Signed)
Left message for Angela Ali that all inhalers on her insurance formulary seem to be the same Tier as Breo.. Tier 3. Advised her to call her insurance for their suggestions per Dr. Diona Browner.

## 2016-08-05 ENCOUNTER — Other Ambulatory Visit: Payer: Self-pay | Admitting: Family Medicine

## 2016-08-05 NOTE — Telephone Encounter (Signed)
Last office visit 05/12/2016.  Last refilled 07/29/2015 for #120 with no refills  Ok to refill?

## 2016-08-25 ENCOUNTER — Encounter: Payer: Self-pay | Admitting: Family Medicine

## 2016-08-25 ENCOUNTER — Ambulatory Visit (INDEPENDENT_AMBULATORY_CARE_PROVIDER_SITE_OTHER): Payer: Medicare PPO

## 2016-08-25 ENCOUNTER — Ambulatory Visit (INDEPENDENT_AMBULATORY_CARE_PROVIDER_SITE_OTHER): Payer: Medicare PPO | Admitting: Family Medicine

## 2016-08-25 ENCOUNTER — Ambulatory Visit: Payer: Medicare PPO

## 2016-08-25 VITALS — BP 138/89 | HR 86 | Temp 97.9°F | Ht 64.0 in | Wt 182.2 lb

## 2016-08-25 DIAGNOSIS — E118 Type 2 diabetes mellitus with unspecified complications: Secondary | ICD-10-CM

## 2016-08-25 DIAGNOSIS — L0231 Cutaneous abscess of buttock: Secondary | ICD-10-CM | POA: Insufficient documentation

## 2016-08-25 DIAGNOSIS — Z5181 Encounter for therapeutic drug level monitoring: Secondary | ICD-10-CM

## 2016-08-25 LAB — POCT INR: INR: 2.5

## 2016-08-25 MED ORDER — CEPHALEXIN 500 MG PO CAPS: 500.0000 mg | ORAL_CAPSULE | Freq: Four times a day (QID) | ORAL | 0 refills | Status: DC

## 2016-08-25 NOTE — Patient Instructions (Addendum)
Continue sitz baths 3-4 times daily or warm compess.  Apply topical antibiotics , covered with Desitin to form barrier.  take probiotic while on antibiotics.  Complete course of antibiotics.  Follow up in 2-3 days.  Call sooner if fever or pain increasing.

## 2016-08-25 NOTE — Progress Notes (Signed)
Pre visit review using our clinic review tool, if applicable. No additional management support is needed unless otherwise documented below in the visit note. 

## 2016-08-25 NOTE — Progress Notes (Signed)
   Subjective:    Patient ID: Angela Ali, female    DOB: 01-Jun-1928, 83 y.o.   MRN: [id number]  HPI   83 year old female presents with new onset pain in rectum .Pain with BM, stinging with wiping and urination.  Painful area began 5 -6 days ago.  She has been doing sitz baths , applying witch hazel and epsom salts.  Using ice pack. 3 days ago..Felt like something opened.. Small amount of blood when wiping.   She has history of hemorrhoids.  She has not been constipated lately.   No fever, no flu like symptoms.  She has has boils in past,vaginally, 2013 I and D. No MRSA history.  Review of Systems  Constitutional: Negative for fatigue and fever.  HENT: Negative for ear pain.   Eyes: Negative for pain.  Respiratory: Negative for chest tightness and shortness of breath.   Cardiovascular: Negative for chest pain, palpitations and leg swelling.  Gastrointestinal: Negative for abdominal pain.  Genitourinary: Negative for dysuria.       Objective:   Physical Exam  Constitutional: Vital signs are normal. She appears well-developed and well-nourished. She is cooperative.  Non-toxic appearance. She does not appear ill. No distress.  Obese female in NAD  HENT:  Head: Normocephalic.  Right Ear: Hearing, tympanic membrane, external ear and ear canal normal. Tympanic membrane is not erythematous, not retracted and not bulging.  Left Ear: Hearing, tympanic membrane, external ear and ear canal normal. Tympanic membrane is not erythematous, not retracted and not bulging.  Nose: No mucosal edema or rhinorrhea. Right sinus exhibits no maxillary sinus tenderness and no frontal sinus tenderness. Left sinus exhibits no maxillary sinus tenderness and no frontal sinus tenderness.  Mouth/Throat: Uvula is midline, oropharynx is clear and moist and mucous membranes are normal.  Eyes: Conjunctivae, EOM and lids are normal. Pupils are equal, round, and reactive to light. Lids are everted and swept, no  foreign bodies found.  Neck: Trachea normal and normal range of motion. Neck supple. Carotid bruit is not present. No thyroid mass and no thyromegaly present.  Cardiovascular: Normal rate, S1 normal, S2 normal, normal heart sounds, intact distal pulses and normal pulses.  An irregularly irregular rhythm present. Exam reveals no gallop and no friction rub.   No murmur heard. Pulmonary/Chest: Effort normal and breath sounds normal. No tachypnea. No respiratory distress. She has no decreased breath sounds. She has no wheezes. She has no rhonchi. She has no rales.  Abdominal: Soft. Normal appearance and bowel sounds are normal. There is no tenderness.  Neurological: She is alert.  Skin: Skin is warm, dry and intact. No rash noted.   Indurated tissue 3 cm x 4 cm left buttock 3 cm from rectum  Centrally no fluctuance, but 0.5 cm shallow opening.  culture obtained.  Psychiatric: Her speech is normal and behavior is normal. Judgment and thought content normal. Her mood appears not anxious. Cognition and memory are normal. She does not exhibit a depressed mood.          Assessment & Plan:

## 2016-08-25 NOTE — Assessment & Plan Note (Signed)
Good control of blood sugars

## 2016-08-25 NOTE — Assessment & Plan Note (Signed)
No clear indication for I and D as lesion has recently drained. Continue sitz baths and cover with antibiotics. Keflex chose given no increase risk fpor MRSA and  kefel does not have interaction with  Coumadin.  Close follow up.

## 2016-08-25 NOTE — Patient Instructions (Signed)
Pre visit review using our clinic review tool, if applicable. No additional management support is needed unless otherwise documented below in the visit note. 

## 2016-08-27 ENCOUNTER — Ambulatory Visit: Payer: Medicare PPO | Admitting: Family Medicine

## 2016-08-28 ENCOUNTER — Ambulatory Visit: Payer: Medicare PPO | Admitting: Family Medicine

## 2016-08-28 LAB — WOUND CULTURE
GRAM STAIN: NONE SEEN
GRAM STAIN: NONE SEEN

## 2016-09-15 ENCOUNTER — Other Ambulatory Visit: Payer: Self-pay | Admitting: *Deleted

## 2016-09-15 MED ORDER — FUROSEMIDE 20 MG PO TABS: 20.0000 mg | ORAL_TABLET | Freq: Every day | ORAL | 1 refills | Status: DC

## 2016-09-15 MED ORDER — WARFARIN SODIUM 5 MG PO TABS: ORAL_TABLET | ORAL | 0 refills | Status: DC

## 2016-09-15 MED ORDER — HYDRALAZINE HCL 50 MG PO TABS: 50.0000 mg | ORAL_TABLET | Freq: Three times a day (TID) | ORAL | 1 refills | Status: DC

## 2016-09-15 MED ORDER — METOPROLOL SUCCINATE ER 50 MG PO TB24: 50.0000 mg | ORAL_TABLET | Freq: Every day | ORAL | 1 refills | Status: DC

## 2016-09-15 MED ORDER — OMEPRAZOLE 20 MG PO CPDR: DELAYED_RELEASE_CAPSULE | ORAL | 1 refills | Status: DC

## 2016-09-18 ENCOUNTER — Telehealth: Payer: Self-pay | Admitting: Family Medicine

## 2016-09-18 NOTE — Telephone Encounter (Signed)
Left pt message asking to call Ebony Hail back directly at [phone] to schedule AWV.+ labs with Katha Cabal and CPE with PCP.  *Note-Due after 10/09/16*

## 2016-09-22 ENCOUNTER — Ambulatory Visit: Payer: Medicare PPO

## 2016-09-22 ENCOUNTER — Other Ambulatory Visit: Payer: Self-pay | Admitting: Family Medicine

## 2016-09-22 NOTE — Telephone Encounter (Signed)
Last office visit 08/25/2016.  Last refilled 09/05/2015 for #30 with no refills.  Ok to refill?

## 2016-09-25 ENCOUNTER — Ambulatory Visit (INDEPENDENT_AMBULATORY_CARE_PROVIDER_SITE_OTHER): Payer: Medicare PPO

## 2016-09-25 DIAGNOSIS — Z5181 Encounter for therapeutic drug level monitoring: Secondary | ICD-10-CM

## 2016-09-25 LAB — POCT INR: INR: 2.5

## 2016-09-25 NOTE — Patient Instructions (Signed)
Pre visit review using our clinic review tool, if applicable. No additional management support is needed unless otherwise documented below in the visit note. 

## 2016-10-09 ENCOUNTER — Other Ambulatory Visit: Payer: Self-pay

## 2016-10-09 MED ORDER — CEPHALEXIN 500 MG PO CAPS: 500.0000 mg | ORAL_CAPSULE | Freq: Four times a day (QID) | ORAL | 0 refills | Status: DC

## 2016-10-09 NOTE — Telephone Encounter (Signed)
Will send in rx for antibiotics.. But if not imrpoving in 48-72 hours.. She needs to be seen for evaluation.

## 2016-10-09 NOTE — Telephone Encounter (Signed)
Pt last seen on 08/25/16; pt is getting a new boil at top of intergluteal cleft pt has no fever and has been using peroxide on area but thinks needs abx to get rid of the boil. Pt said she had hx of boils. [place]

## 2016-10-09 NOTE — Telephone Encounter (Signed)
Ms. Tavano notified as instructed by telephone. 

## 2016-10-13 ENCOUNTER — Encounter: Payer: Self-pay | Admitting: Family Medicine

## 2016-10-20 ENCOUNTER — Telehealth: Payer: Self-pay | Admitting: Family Medicine

## 2016-10-20 NOTE — Telephone Encounter (Signed)
Pt dropped off form to be filled out for parking placard. Placed in RX tower. °

## 2016-10-21 NOTE — Telephone Encounter (Signed)
Form in your inbox 

## 2016-10-22 LAB — HM DIABETES EYE EXAM

## 2016-10-23 ENCOUNTER — Ambulatory Visit: Payer: Medicare PPO

## 2016-10-23 ENCOUNTER — Ambulatory Visit (INDEPENDENT_AMBULATORY_CARE_PROVIDER_SITE_OTHER): Payer: Medicare PPO

## 2016-10-23 DIAGNOSIS — Z5181 Encounter for therapeutic drug level monitoring: Secondary | ICD-10-CM

## 2016-10-23 LAB — POCT INR: INR: 2.2

## 2016-10-23 NOTE — Patient Instructions (Signed)
Pre visit review using our clinic review tool, if applicable. No additional management support is needed unless otherwise documented below in the visit note. 

## 2016-10-29 NOTE — Telephone Encounter (Signed)
Left pt message asking to call Allison back directly at [phone] to schedule AWV + labs with Lesia and CPE with PCP. °

## 2016-11-03 ENCOUNTER — Ambulatory Visit: Payer: Medicare PPO

## 2016-11-20 ENCOUNTER — Telehealth: Payer: Self-pay | Admitting: *Deleted

## 2016-11-20 ENCOUNTER — Ambulatory Visit (INDEPENDENT_AMBULATORY_CARE_PROVIDER_SITE_OTHER): Payer: Medicare PPO

## 2016-11-20 ENCOUNTER — Telehealth: Payer: Self-pay

## 2016-11-20 ENCOUNTER — Ambulatory Visit (INDEPENDENT_AMBULATORY_CARE_PROVIDER_SITE_OTHER): Payer: Medicare PPO | Admitting: Family Medicine

## 2016-11-20 ENCOUNTER — Encounter: Payer: Self-pay | Admitting: *Deleted

## 2016-11-20 ENCOUNTER — Encounter: Payer: Self-pay | Admitting: Family Medicine

## 2016-11-20 VITALS — BP 147/91 | HR 62 | Temp 98.2°F | Ht 64.0 in | Wt 185.0 lb

## 2016-11-20 DIAGNOSIS — R053 Chronic cough: Secondary | ICD-10-CM

## 2016-11-20 DIAGNOSIS — J301 Allergic rhinitis due to pollen: Secondary | ICD-10-CM | POA: Diagnosis not present

## 2016-11-20 DIAGNOSIS — R05 Cough: Secondary | ICD-10-CM | POA: Diagnosis not present

## 2016-11-20 DIAGNOSIS — Z5181 Encounter for therapeutic drug level monitoring: Secondary | ICD-10-CM

## 2016-11-20 DIAGNOSIS — M545 Low back pain: Secondary | ICD-10-CM | POA: Diagnosis not present

## 2016-11-20 DIAGNOSIS — G8929 Other chronic pain: Secondary | ICD-10-CM

## 2016-11-20 DIAGNOSIS — I10 Essential (primary) hypertension: Secondary | ICD-10-CM

## 2016-11-20 DIAGNOSIS — M5126 Other intervertebral disc displacement, lumbar region: Secondary | ICD-10-CM | POA: Insufficient documentation

## 2016-11-20 DIAGNOSIS — M5136 Other intervertebral disc degeneration, lumbar region: Secondary | ICD-10-CM | POA: Insufficient documentation

## 2016-11-20 LAB — POCT INR: INR: 2.4

## 2016-11-20 MED ORDER — POTASSIUM CHLORIDE CRYS ER 20 MEQ PO TBCR: 20.0000 meq | EXTENDED_RELEASE_TABLET | Freq: Every day | ORAL | 3 refills | Status: DC

## 2016-11-20 MED ORDER — FEXOFENADINE HCL 180 MG PO TABS: 180.0000 mg | ORAL_TABLET | Freq: Every day | ORAL | 3 refills | Status: DC

## 2016-11-20 MED ORDER — FUROSEMIDE 20 MG PO TABS: 20.0000 mg | ORAL_TABLET | Freq: Every day | ORAL | 1 refills | Status: DC

## 2016-11-20 MED ORDER — LIDOCAINE 5 % EX PTCH: 1.0000 | MEDICATED_PATCH | CUTANEOUS | 0 refills | Status: DC

## 2016-11-20 MED ORDER — MONTELUKAST SODIUM 10 MG PO TABS: 10.0000 mg | ORAL_TABLET | Freq: Every day | ORAL | 3 refills | Status: DC

## 2016-11-20 NOTE — Patient Instructions (Signed)
Pre visit review using our clinic review tool, if applicable. No additional management support is needed unless otherwise documented below in the visit note. 

## 2016-11-20 NOTE — Assessment & Plan Note (Signed)
Blood pressure elevated today in office, may be due to increase back pain.  Follow at home.. Call if still elevated once pain improved.

## 2016-11-20 NOTE — Progress Notes (Signed)
   Subjective:    Patient ID: Angela Ali, female    DOB: December 03, 1928, 83 y.o.   MRN: [id number]  HPI    83 year old female with history of allergic rhinitis, chronic cough, CHF, DM, HTN, secondary cardiomyopathy presents for  Discussion of  Allergies and new onset dizziness   1. Allergic rhinitis: Causing nasal congestion, post nasal drip, hoarse voice Has tried multiple meds in past with mild improvement. On singulair, nasal steroid  No fever, no change in SOB.    2.  Have been having some increase in chronic low back pain in last 2 weeks that may be causing BP elevation. She has been using icy hot, heating pad. Tylenol does not help much for pain.  SE to tramadol. NSID contraindicated.  no numbness or weakness, no radiation of pain. No recent falls.   3. Blood pressure elevation: increased here today .Marland Kitchen Has not been checking at home  BP Readings from Last 3 Encounters:  11/20/16 (!) 147/91  08/25/16 138/89  05/12/16 (!) 145/92    No changes in medications lately.     Blood pressure (!) 147/91, pulse 62, temperature 98.2 F (36.8 C), temperature source Oral, height 5\' 4"  (1.626 m), weight 185 lb (83.9 kg).     Review of Systems     Objective:   Physical Exam  Constitutional: Vital signs are normal. She appears well-developed and well-nourished. She is cooperative.  Non-toxic appearance. She does not appear ill. No distress.  HENT:  Head: Normocephalic.  Right Ear: Hearing, tympanic membrane, external ear and ear canal normal. Tympanic membrane is not erythematous, not retracted and not bulging.  Left Ear: Hearing, tympanic membrane, external ear and ear canal normal. Tympanic membrane is not erythematous, not retracted and not bulging.  Nose: No mucosal edema or rhinorrhea. Right sinus exhibits no maxillary sinus tenderness and no frontal sinus tenderness. Left sinus exhibits no maxillary sinus tenderness and no frontal sinus tenderness.  Mouth/Throat: Uvula is  midline, oropharynx is clear and moist and mucous membranes are normal.  Eyes: Pupils are equal, round, and reactive to light. Conjunctivae, EOM and lids are normal. Lids are everted and swept, no foreign bodies found.  Neck: Trachea normal and normal range of motion. Neck supple. Carotid bruit is not present. No thyroid mass and no thyromegaly present.  Cardiovascular: Normal rate, regular rhythm, S1 normal, S2 normal, normal heart sounds, intact distal pulses and normal pulses.  Exam reveals no gallop and no friction rub.   No murmur heard. Pulmonary/Chest: Effort normal and breath sounds normal. No tachypnea. No respiratory distress. She has no decreased breath sounds. She has no wheezes. She has no rhonchi. She has no rales.  Abdominal: Soft. Normal appearance and bowel sounds are normal. There is no tenderness.  Musculoskeletal:       Lumbar back: She exhibits decreased range of motion and tenderness. She exhibits no bony tenderness.  Neg SLR bialterally, neg faber's  Neurological: She is alert.  Skin: Skin is warm, dry and intact. No rash noted.  Psychiatric: Her speech is normal and behavior is normal. Judgment and thought content normal. Her mood appears not anxious. Cognition and memory are normal. She does not exhibit a depressed mood.          Assessment & Plan:

## 2016-11-20 NOTE — Assessment & Plan Note (Signed)
NSAIDs contraindicated, cannot tolerate gabapentin, lyrica.  tramadol causes SE. Tylenol ineffective  Will try trial of lidoderm patches.

## 2016-11-20 NOTE — Assessment & Plan Note (Signed)
Likely due to allergies.

## 2016-11-20 NOTE — Telephone Encounter (Signed)
While patient was in for INR check this am she requested a blood pressure check.    Patient reports increase in dizziness and "not feeling right" X 1 week and is concerned.    BP in left arm : 164/90 Recheck in Right arm several minutes later: 170/92  Given readings and patient's symptoms, I recommended she follow up with her PCP Dr. Diona Browner.  In review of PCP schedule for today there was an opening at 1130 and patient encouraged to take it.  I notified Hedy Camara, CMA and added her to the schedule.    INR today 2.4 (therapeutic).    Thanks.

## 2016-11-20 NOTE — Telephone Encounter (Signed)
Received fax from [facility], [place] requesting PA for Lidoderm 5% patch.  PA completed on CoverMyMeds.  Sent for review.  Can take up to 72 hours to get a response.

## 2016-11-20 NOTE — Patient Instructions (Addendum)
Add allegra 180 mg daily for allergies.  Call if interested in allergist referral.   Can try lidocaine patches on low back for pain.  Follow blood pressure at pharmacy .Marland KitchenMarland KitchenCall if BP > 140/90.

## 2016-11-20 NOTE — Assessment & Plan Note (Addendum)
Offered referral to allergist.. To high a copay per pt.. Refused.  Will repeat trial of allegra in addition to singulair.

## 2016-11-23 NOTE — Telephone Encounter (Signed)
Patient left a voicemail stating that [facility] will no longer pay for her Lidocaine patch. Patient wants to know if there is something else you can prescribe in the place of this?

## 2016-11-23 NOTE — Telephone Encounter (Signed)
PA denied.  Pharmacy advised via fax.  I recommended that patient can buy OTC Lidocaine Patches 4%.

## 2016-11-24 NOTE — Telephone Encounter (Signed)
Spoke with Angela Ali and recommended that she gets Lidocaine 4% patch OTC. Patient is agreeable to try the OTC patch.

## 2016-11-24 NOTE — Telephone Encounter (Signed)
Did she look into OTC patches as Butch Penny recomended?

## 2016-12-18 ENCOUNTER — Ambulatory Visit (INDEPENDENT_AMBULATORY_CARE_PROVIDER_SITE_OTHER): Payer: Medicare PPO

## 2016-12-18 DIAGNOSIS — Z5181 Encounter for therapeutic drug level monitoring: Secondary | ICD-10-CM

## 2016-12-18 LAB — POCT INR: INR: 2.5

## 2016-12-18 NOTE — Patient Instructions (Signed)
Pre visit review using our clinic review tool, if applicable. No additional management support is needed unless otherwise documented below in the visit note. 

## 2016-12-24 ENCOUNTER — Other Ambulatory Visit: Payer: Self-pay | Admitting: Cardiovascular Disease

## 2016-12-24 ENCOUNTER — Other Ambulatory Visit: Payer: Self-pay | Admitting: Family Medicine

## 2016-12-30 ENCOUNTER — Ambulatory Visit: Payer: Medicare PPO | Admitting: Family Medicine

## 2017-01-04 ENCOUNTER — Encounter: Payer: Self-pay | Admitting: Family Medicine

## 2017-01-04 ENCOUNTER — Ambulatory Visit (INDEPENDENT_AMBULATORY_CARE_PROVIDER_SITE_OTHER): Payer: Medicare PPO | Admitting: Family Medicine

## 2017-01-04 VITALS — BP 122/80 | HR 50 | Temp 98.7°F | Ht 64.0 in | Wt 183.5 lb

## 2017-01-04 DIAGNOSIS — M545 Low back pain, unspecified: Secondary | ICD-10-CM

## 2017-01-04 DIAGNOSIS — M5126 Other intervertebral disc displacement, lumbar region: Secondary | ICD-10-CM

## 2017-01-04 DIAGNOSIS — Z7901 Long term (current) use of anticoagulants: Secondary | ICD-10-CM

## 2017-01-04 DIAGNOSIS — G8929 Other chronic pain: Secondary | ICD-10-CM

## 2017-01-04 DIAGNOSIS — I4891 Unspecified atrial fibrillation: Secondary | ICD-10-CM

## 2017-01-04 DIAGNOSIS — M5136 Other intervertebral disc degeneration, lumbar region: Secondary | ICD-10-CM

## 2017-01-04 DIAGNOSIS — Z5181 Encounter for therapeutic drug level monitoring: Secondary | ICD-10-CM | POA: Diagnosis not present

## 2017-01-04 MED ORDER — CYCLOBENZAPRINE HCL 5 MG PO TABS: 5.0000 mg | ORAL_TABLET | Freq: Every day | ORAL | 0 refills | Status: DC

## 2017-01-04 MED ORDER — GLUCOSE BLOOD VI STRP: ORAL_STRIP | 3 refills | Status: DC

## 2017-01-04 MED ORDER — DEXAMETHASONE SODIUM PHOSPHATE 100 MG/10ML IJ SOLN
10.0000 mg | Freq: Once | INTRAMUSCULAR | Status: AC
  Administered 2017-01-04: 10 mg via INTRAMUSCULAR

## 2017-01-04 NOTE — Patient Instructions (Signed)
Try taking 2 lidocaine patches.

## 2017-01-04 NOTE — Progress Notes (Signed)
Dr. Karleen Hampshire T. Hubert Derstine, MD, CAQ Sports [facility] [address] [place], [address] Phone: [phone] Fax: [phone]  01/04/2017  Patient: Angela Ali, MRN: [id number], DOB: 09-22-1928, 83 y.o.  Primary Physician:  Excell Seltzer, MD   Chief Complaint  Patient presents with  . Back Pain    Low x 4 months   Subjective:   Angela Ali is an 83 y.o. very pleasant female patient who presents with the following:  LBP x 4 months: known disc herniation 2016 L-spine MRI.  She has had intermittent chronic back pain for years, known significant disc herniation seen on her MRI from 2 years prior.  She also is chronically on Coumadin. She does take some tramadol as well as Tylenol, which haven't really seemed to help all that much.  She also tried some lidocaine patches, only 1 patch in the affected region, and she doesn't think that this is helpful that much either.  Her primary concern is that she has a daughter who is getting married on Friday, she wants to have some pain relief so that she can participate in the wedding.  Past Medical History, Surgical History, Social History, Family History, Problem List, Medications, and Allergies have been reviewed and updated if relevant.  Patient Active Problem List   Diagnosis Date Noted  . Bulging lumbar disc 11/20/2016  . Left buttock abscess 08/25/2016  . Cough 05/12/2016  . Mass on back 02/18/2016  . Morbid obesity due to excess calories (HCC) 11/25/2015  . Sacroiliac joint pain 09/26/2015  . Lipoma of back 06/06/2015  . Osteoarthritis of both knees 01/18/2015  . Counseling regarding end of life decision making 09/07/2014  . Encounter for therapeutic drug monitoring 06/05/2013  . Chronic diastolic CHF (congestive heart failure) (HCC) 03/21/2013  . Chronic cough 02/03/2013  . Recurrent boils 06/22/2012  . Chronic constipation 12/01/2011  . Splenic mass 08/21/2011  . Hx of Hematoma of leg, with  chronic leg weakness and numbness 09/15/2010  . Moderate persistent asthma in adult without complication 07/03/2009  . FATIGUE, CHRONIC 03/19/2009  . UNSPECIFIED VITAMIN D DEFICIENCY 02/26/2009  . LOW BACK PAIN, CHRONIC 11/19/2008  . Morbid obesity (HCC) 10/02/2008  . Secondary cardiomyopathy (HCC) 10/02/2008  . Allergic rhinitis due to pollen 03/23/2008  . BENIGN NEOPLASM OTH&UNSPEC SITE DIGESTIVE SYSTEM 02/01/2007  . STRICTURE, ESOPHAGEAL 02/01/2007  . Diabetes mellitus type 2 with complications (HCC) 12/24/2006  . Osteoporosis 12/21/2006  . Hyperlipemia 12/16/2006  . Essential hypertension, benign 12/16/2006  . Atrial fibrillation (HCC) 12/16/2006  . Congestive heart failure (HCC) 12/16/2006  . GERD 12/16/2006  . HIATAL HERNIA 12/16/2006  . OSTEOARTHRITIS 12/16/2006  . KNEE PAIN, RIGHT, CHRONIC 12/16/2006  . HERNIATED LUMBAR DISC 12/16/2006  . PERIPHERAL EDEMA 12/16/2006  . COLONIC POLYPS, HYPERPLASTIC 08/26/2000  . DIVERTICULOSIS, COLON 08/26/2000    Past Medical History:  Diagnosis Date  . Allergic rhinitis, cause unspecified   . Atrial fibrillation (HCC)   . Atrial flutter (HCC)   . Bacterial pneumonia, unspecified   . Colonic polyp   . Congestive heart failure, unspecified   . Diabetes mellitus without complication (HCC)    borderline-checks sugars but no meds  . Diaphragmatic hernia without mention of obstruction or gangrene   . Displacement of lumbar intervertebral disc without myelopathy   . Diverticulosis of colon (without mention of hemorrhage)   . Edema   . Esophageal reflux   . Long term (current) use of anticoagulants   .  Osteoarthrosis, unspecified whether generalized or localized, unspecified site   . Other abnormal glucose   . Other and unspecified hyperlipidemia   . Other malaise and fatigue   . Overweight(278.02)   . Prediabetes   . Secondary cardiomyopathy, unspecified   . Splenic mass 08/21/2011  . Stricture and stenosis of esophagus   . Thigh  abscess   . Unspecified essential hypertension     Past Surgical History:  Procedure Laterality Date  . ABDOMINAL HYSTERECTOMY  1971   partial  . ANKLE SURGERY Left 2001  . CHOLECYSTECTOMY  2004  . COLONOSCOPY    . ESOPHAGEAL DILATION  01/2007  . POLYPECTOMY    . spleen removed     2005 or 2006 in [place]    Social History   Social History  . Marital status: Widowed    Spouse name: N/A  . Number of children: 8  . Years of education: N/A   Occupational History  . Retired Runner, broadcasting/film/video Retired    retired Runner, broadcasting/film/video.  Used to live in [place], [place], now in [place] with her family   Social History Main Topics  . Smoking status: Never Smoker  . Smokeless tobacco: Never Used  . Alcohol use No  . Drug use: No  . Sexual activity: No   Other Topics Concern  . Not on file   Social History Narrative   Lost spouse to cancer in 07/2006   5 children   Does not get regular exercise       Family History  Problem Relation Age of Onset  . Emphysema Father 16  . Coronary artery disease Mother 74  . Hypertension Mother 30  . Ovarian cancer Daughter   . Bone cancer Son        bone marrow ca  . Hyperlipidemia Sister        # 1  . Hyperlipidemia Sister        # 2  . Diabetes Daughter        # 1  . Hypertension Daughter   . Diabetes Daughter        # 2   . Hypertension Daughter   . Colon cancer Neg Hx     Allergies  Allergen Reactions  . Morphine And Related   . Percocet [Oxycodone-Acetaminophen] Nausea Only       . Tramadol Nausea Only    Medication list reviewed and updated in full in [facility].  GEN: No fevers, chills. Nontoxic. Primarily MSK c/o today. MSK: Detailed in the HPI GI: tolerating PO intake without difficulty Neuro: No numbness, parasthesias, or tingling associated. Otherwise the pertinent positives of the ROS are noted above.   Objective:   BP 122/80   Pulse (!) 50   Temp 98.7 F (37.1 C) (Oral)   Ht 5\' 4"  (1.626 m)   Wt 183 lb  8 oz (83.2 kg)   BMI 31.50 kg/m    GEN: WDWN, NAD, Non-toxic, Alert & Oriented x 3 HEENT: Atraumatic, Normocephalic.  Ears and Nose: No external deformity. EXTR: No clubbing/cyanosis/edema NEURO: Normal gait.  PSYCH: Normally interactive. Conversant. Not depressed or anxious appearing.  Calm demeanor.    No significant midline spinal tenderness.  She does have some relatively diffuse tenderness from L2-S1 bilaterally.  She appears to be neurovascularly intact.  Straight leg raise is negative today.  Radiology: CLINICAL DATA:  Left buttock pain radiating to the left knee.   EXAM:  MRI LUMBAR SPINE WITHOUT CONTRAST   TECHNIQUE:  Multiplanar, multisequence  MR imaging of the lumbar spine was  performed. No intravenous contrast was administered.   COMPARISON:  None.   FINDINGS:  The vertebral bodies of the lumbar spine are normal in size. The  vertebral bodies of the lumbar spine are normal in alignment. There  is normal bone marrow signal demonstrated throughout the vertebra.  There is degenerative disc disease with disc height loss at L1-2,  L4-5 and L5-S1.   The spinal cord is normal in signal and contour. The cord terminates  normally at L1 . The nerve roots of the cauda equina and the filum  terminale are normal.   The visualized portions of the SI joints are unremarkable.   There are bilateral T2 hyperintense renal lesions most consistent  with cysts.   T12-L1: No significant disc bulge. No evidence of neural foraminal  stenosis. No central canal stenosis.   L1-L2: Mild broad-based disc bulge. No evidence of neural foraminal  stenosis. Mild central canal narrowing resulting from the  broad-based disc bulge.   L2-L3: Mild broad-based disc bulge. Mild bilateral facet  arthropathy. No evidence of neural foraminal stenosis. No central  canal stenosis.   L3-L4: Mild broad-based disc bulge with a left foraminal disc  protrusion with superior migration of  disc material with mass effect  on the left L3 nerve root. No right foraminal stenosis. No central  canal stenosis.   L4-L5: Mild broad-based disc bulge. Mild bilateral facet  arthropathy. Bile bilateral foraminal stenosis. No central canal  stenosis.   L5-S1: Mild broad-based disc bulge eccentric towards the left.  Moderate bilateral facet arthropathy. Moderate left and mild right  foraminal narrowing. No central canal stenosis.   IMPRESSION:  1. At L3-4 there is a mild broad-based disc bulge with a left  foraminal disc protrusion with superior migration of disc material  with mass effect on the left L3 nerve root.  2. Lumbar spine spondylosis as described above.    Electronically Signed    By: Elige Ko    On: 07/24/2014 13:37   Assessment and Plan:   Chronic bilateral low back pain without sciatica - Plan: dexamethasone (DECADRON) injection 10 mg  Bulging lumbar disc  Atrial fibrillation, unspecified type (HCC)  Anticoagulated on Coumadin  Try increasing lidocaine patches to 2 to see if this provides additional relief.  Refill the patient's Flexeril.  IM Decadron shot today to see if we can get her calm down enough to participate in her daughter's wedding.  Complex back pain in an 83 year old with multiple medical comorbidities, and there is truly no permanent solution to this.  Follow-up: No Follow-up on file.  Future Appointments Date Time Provider Department Center  01/15/2017 9:15 AM [facility]  01/26/2017 9:30 AM Ok Anis, NP [facility]    Meds ordered this encounter  Medications  . glucose blood test strip    Sig: Use as instructed to check blood sugar 2 (two) times daily dx E11.9    Dispense:  150 each    Refill:  3    accu-check smartview test strips  . cyclobenzaprine (FLEXERIL) 5 MG tablet    Sig: Take 1 tablet (5 mg total) by mouth at bedtime.    Dispense:  30 tablet    Refill:  0      Please consider 90 day supplies to promote better adherence  . dexamethasone (DECADRON) injection 10 mg   Medications Discontinued During This Encounter  Medication Reason  . fluticasone furoate-vilanterol (BREO ELLIPTA)  200-25 MCG/INH AEPB Not covered by the pt's insurance  . lidocaine (LIDODERM) 5 % Not covered by the pt's insurance  . diclofenac sodium (VOLTAREN) 1 % GEL Ineffective  . glucose blood test strip Reorder  . cyclobenzaprine (FLEXERIL) 5 MG tablet Reorder   Signed,  Chadd Tollison T. Madell Heino, MD   Patient's Medications  New Prescriptions   No medications on file  Previous Medications   ACCU-CHEK FASTCLIX LANCETS MISC    1 strip by Does not apply route 2 (two) times daily as needed. dx 250.90   ACETAMINOPHEN (TYLENOL ARTHRITIS PAIN) 650 MG CR TABLET    Take 650 mg by mouth daily.   ASCORBIC ACID (VITAMIN C PO)    Take 1 tablet by mouth daily.   ATORVASTATIN (LIPITOR) 80 MG TABLET    Take 1 tablet (80 mg total) by mouth daily.   BENZONATATE (TESSALON) 100 MG CAPSULE    TAKE TWO CAPSULES BY MOUTH TWICE DAILY AS NEEDED FOR COUGH   CHOLECALCIFEROL (VITAMIN D-3 PO)    Take 1 tablet by mouth daily.   CLONIDINE (CATAPRES) 0.1 MG TABLET    TAKE 1/2 TABLET TWICE DAILY   DIGOXIN (LANOXIN) 0.125 MG TABLET    TAKE 1 TABLET EVERY DAY   FEXOFENADINE (ALLEGRA) 180 MG TABLET    Take 1 tablet (180 mg total) by mouth daily.   FLUTICASONE (FLONASE) 50 MCG/ACT NASAL SPRAY    Place 2 sprays into both nostrils daily.   FUROSEMIDE (LASIX) 20 MG TABLET    Take 1 tablet (20 mg total) by mouth daily.   HYDRALAZINE (APRESOLINE) 50 MG TABLET    Take 1 tablet (50 mg total) by mouth 3 (three) times daily.   HYDROCHLOROTHIAZIDE (HYDRODIURIL) 25 MG TABLET    TAKE 1 TABLET EVERY DAY   LATANOPROST (XALATAN) 0.005 % OPHTHALMIC SOLUTION       LOSARTAN (COZAAR) 100 MG TABLET    TAKE 1 TABLET EVERY DAY   METOPROLOL SUCCINATE (TOPROL-XL) 50 MG 24 HR TABLET    Take 1 tablet (50 mg total) by mouth daily. Take  with or immediately following a meal.   MONTELUKAST (SINGULAIR) 10 MG TABLET    Take 1 tablet (10 mg total) by mouth at bedtime.   OMEPRAZOLE (PRILOSEC) 20 MG CAPSULE    TAKE 1 CAPSULE TWICE DAILY BEFORE A MEAL   POTASSIUM CHLORIDE SA (K-DUR,KLOR-CON) 20 MEQ TABLET    Take 1 tablet (20 mEq total) by mouth daily.   TIMOLOL (TIMOPTIC) 0.5 % OPHTHALMIC SOLUTION       TRAMADOL (ULTRAM) 50 MG TABLET    Take 0.5 tablets (25 mg total) by mouth every 8 (eight) hours as needed.   WARFARIN (COUMADIN) 5 MG TABLET    TAKE AS DIRECTED BY ANTI-COAGULATION CLINIC  Modified Medications   Modified Medication Previous Medication   CYCLOBENZAPRINE (FLEXERIL) 5 MG TABLET cyclobenzaprine (FLEXERIL) 5 MG tablet      Take 1 tablet (5 mg total) by mouth at bedtime.    TAKE ONE TABLET BY MOUTH AT BEDTIME   GLUCOSE BLOOD TEST STRIP glucose blood test strip      Use as instructed to check blood sugar 2 (two) times daily dx E11.9    Use as instructed to check blood sugar 2 (two) times daily dx E11.9  Discontinued Medications   DICLOFENAC SODIUM (VOLTAREN) 1 % GEL    Apply 4 g topically 4 (four) times daily as needed (pain in left knee).   FLUTICASONE FUROATE-VILANTEROL (BREO ELLIPTA) 200-25 MCG/INH  AEPB    Inhale 1 puff into the lungs daily.   LIDOCAINE (LIDODERM) 5 %    Place 1 patch onto the skin daily. Remove & Discard patch within 12 hours or as directed by MD

## 2017-01-15 ENCOUNTER — Ambulatory Visit: Payer: Medicare PPO

## 2017-01-26 ENCOUNTER — Ambulatory Visit: Payer: Medicare PPO | Admitting: Nurse Practitioner

## 2017-01-26 ENCOUNTER — Ambulatory Visit (INDEPENDENT_AMBULATORY_CARE_PROVIDER_SITE_OTHER): Payer: Medicare PPO

## 2017-01-26 DIAGNOSIS — Z5181 Encounter for therapeutic drug level monitoring: Secondary | ICD-10-CM | POA: Diagnosis not present

## 2017-01-26 LAB — POCT INR: INR: 2.5

## 2017-01-26 NOTE — Patient Instructions (Signed)
Pre visit review using our clinic review tool, if applicable. No additional management support is needed unless otherwise documented below in the visit note. 

## 2017-02-03 ENCOUNTER — Telehealth: Payer: Self-pay | Admitting: Family Medicine

## 2017-02-03 DIAGNOSIS — E559 Vitamin D deficiency, unspecified: Secondary | ICD-10-CM

## 2017-02-03 DIAGNOSIS — E118 Type 2 diabetes mellitus with unspecified complications: Secondary | ICD-10-CM

## 2017-02-03 DIAGNOSIS — E7800 Pure hypercholesterolemia, unspecified: Secondary | ICD-10-CM

## 2017-02-03 DIAGNOSIS — M810 Age-related osteoporosis without current pathological fracture: Secondary | ICD-10-CM

## 2017-02-03 NOTE — Telephone Encounter (Signed)
-----   Message from Eustace Pen, LPN sent at 55/10/3218 12:36 PM EDT ----- Regarding: Labs 10/11 Lab orders needed. Thank you.  Insurance: [employer]

## 2017-02-04 ENCOUNTER — Ambulatory Visit: Payer: Medicare PPO

## 2017-02-18 ENCOUNTER — Telehealth: Payer: Self-pay | Admitting: *Deleted

## 2017-02-18 NOTE — Telephone Encounter (Signed)
Copied from [place] #[id number]. Topic: General - Other >> Feb 18, 2017  1:41 PM Aurelio Brash B wrote: Reason for CRM: Pt called to change her coumadin clinic apt, after rescheduling her apt she asked if she could speak with Nurse Hedy Camara concerning her cough or possibly allergies.  I tolder her we had nurses on site here that could speak with her however she refused, wanting only to speak with Butch Penny, she wanted to know if Butch Penny could call her back. I told her I would get the message to Butch Penny.  Returned patient's call and got her voicemail. Left message that I would try to reach her again later today.

## 2017-02-18 NOTE — Telephone Encounter (Signed)
Spoke with Ms. Kimoto.  She states she fell on Monday and is hurting down her right side.  She is also complaining of sinus drainage and hoarseness.  She is requesting an office visit.  Appointment scheduled with Dr. Diona Browner for Friday 02/19/2017 at 10:00am.

## 2017-02-19 ENCOUNTER — Ambulatory Visit (INDEPENDENT_AMBULATORY_CARE_PROVIDER_SITE_OTHER): Payer: Medicare PPO

## 2017-02-19 ENCOUNTER — Encounter: Payer: Self-pay | Admitting: Family Medicine

## 2017-02-19 ENCOUNTER — Ambulatory Visit (INDEPENDENT_AMBULATORY_CARE_PROVIDER_SITE_OTHER): Payer: Medicare PPO | Admitting: Family Medicine

## 2017-02-19 VITALS — BP 120/74 | HR 80 | Temp 98.6°F | Ht 64.0 in

## 2017-02-19 DIAGNOSIS — W19XXXA Unspecified fall, initial encounter: Secondary | ICD-10-CM | POA: Diagnosis not present

## 2017-02-19 DIAGNOSIS — J301 Allergic rhinitis due to pollen: Secondary | ICD-10-CM | POA: Diagnosis not present

## 2017-02-19 DIAGNOSIS — Z7901 Long term (current) use of anticoagulants: Secondary | ICD-10-CM | POA: Diagnosis not present

## 2017-02-19 DIAGNOSIS — Z5181 Encounter for therapeutic drug level monitoring: Secondary | ICD-10-CM

## 2017-02-19 DIAGNOSIS — T22111A Burn of first degree of right forearm, initial encounter: Secondary | ICD-10-CM | POA: Diagnosis not present

## 2017-02-19 LAB — POCT INR: INR: 2.6

## 2017-02-19 MED ORDER — AZELASTINE HCL 137 MCG/SPRAY NA SOLN: 1.0000 | Freq: Two times a day (BID) | NASAL | 11 refills | Status: DC

## 2017-02-19 NOTE — Progress Notes (Signed)
   Subjective:    Patient ID: Angela Ali, female    DOB: 25-Nov-1928, 83 y.o.   MRN: [id number]  HPI   83 year old female with DM presents with multiple issues.  1. Accidental fall on Saturday 10/20 when she burned right wrist with cooking grease. Jumped back and hit trash can.  Did not hit head. Landed backward on buttock, right.  Minimal pain, slight bruise.  2. Burn on right wrist occurred on 10/20.  Applying burn gel.  Healing well.  Minimal pain and no redness around it.  3. Sinus drainage and hoarse voice... Ongoing in last week.  Post nasal drip. No sinus pain.  Using allegra, singulair, flonase  Dry tickly cough in throat. No SOB, no CP.  no fever.   Review of Systems     Objective:   Physical Exam  Constitutional: Vital signs are normal. She appears well-developed and well-nourished. She is cooperative.  Non-toxic appearance. She does not appear ill. No distress.  HENT:  Head: Normocephalic.  Right Ear: Hearing, tympanic membrane, external ear and ear canal normal. Tympanic membrane is not erythematous, not retracted and not bulging.  Left Ear: Hearing, tympanic membrane, external ear and ear canal normal. Tympanic membrane is not erythematous, not retracted and not bulging.  Nose: Mucosal edema and rhinorrhea present. Right sinus exhibits no maxillary sinus tenderness and no frontal sinus tenderness. Left sinus exhibits no maxillary sinus tenderness and no frontal sinus tenderness.  Mouth/Throat: Uvula is midline, oropharynx is clear and moist and mucous membranes are normal.  Eyes: Pupils are equal, round, and reactive to light. Conjunctivae, EOM and lids are normal. Lids are everted and swept, no foreign bodies found.  Neck: Trachea normal and normal range of motion. Neck supple. Carotid bruit is not present. No thyroid mass and no thyromegaly present.  Cardiovascular: Normal rate, regular rhythm, S1 normal, S2 normal, normal heart sounds, intact distal pulses and  normal pulses.  Exam reveals no gallop and no friction rub.   No murmur heard. Pulmonary/Chest: Effort normal and breath sounds normal. No tachypnea. No respiratory distress. She has no decreased breath sounds. She has no wheezes. She has no rhonchi. She has no rales.  Musculoskeletal:       Right hip: Normal.       Left hip: Normal.       Thoracic back: Normal.       Lumbar back: Normal.  Neurological: She is alert.  Skin: Skin is warm, dry and intact. No rash noted.  ttp and bruise on right buttock   healing 4 cm 2 cm 1st degree burn on right forearm.  Psychiatric: Her speech is normal and behavior is normal. Judgment normal. Her mood appears not anxious. Cognition and memory are normal. She does not exhibit a depressed mood.          Assessment & Plan:

## 2017-02-19 NOTE — Patient Instructions (Signed)
Pre visit review using our clinic review tool, if applicable. No additional management support is needed unless otherwise documented below in the visit note. 

## 2017-02-19 NOTE — Assessment & Plan Note (Signed)
Healing. No sig of infection.

## 2017-02-19 NOTE — Assessment & Plan Note (Signed)
No current sign of infection.  treat with current allergy meds, but add nasal antihistamine.

## 2017-02-19 NOTE — Assessment & Plan Note (Signed)
Due to burn.  Healing soreness in right buttock, no bony pain or concern for fracture.

## 2017-02-19 NOTE — Patient Instructions (Addendum)
Continue current allergy meds  Including flonase but also add nasal antihistamine spray.

## 2017-02-23 ENCOUNTER — Ambulatory Visit: Payer: Medicare PPO

## 2017-03-02 ENCOUNTER — Ambulatory Visit: Payer: Medicare PPO | Admitting: Nurse Practitioner

## 2017-03-03 ENCOUNTER — Ambulatory Visit (INDEPENDENT_AMBULATORY_CARE_PROVIDER_SITE_OTHER): Payer: Medicare PPO

## 2017-03-03 VITALS — BP 118/72 | HR 83 | Temp 97.6°F | Ht 62.5 in | Wt 183.2 lb

## 2017-03-03 DIAGNOSIS — Z0000 Encounter for general adult medical examination without abnormal findings: Secondary | ICD-10-CM

## 2017-03-03 DIAGNOSIS — E559 Vitamin D deficiency, unspecified: Secondary | ICD-10-CM

## 2017-03-03 DIAGNOSIS — E7800 Pure hypercholesterolemia, unspecified: Secondary | ICD-10-CM | POA: Diagnosis not present

## 2017-03-03 DIAGNOSIS — E118 Type 2 diabetes mellitus with unspecified complications: Secondary | ICD-10-CM | POA: Diagnosis not present

## 2017-03-03 LAB — COMPREHENSIVE METABOLIC PANEL
ALT: 11 U/L (ref 0–35)
AST: 12 U/L (ref 0–37)
Albumin: 4.1 g/dL (ref 3.5–5.2)
Alkaline Phosphatase: 75 U/L (ref 39–117)
BILIRUBIN TOTAL: 0.5 mg/dL (ref 0.2–1.2)
BUN: 17 mg/dL (ref 6–23)
CALCIUM: 10.7 mg/dL — AB (ref 8.4–10.5)
CHLORIDE: 102 meq/L (ref 96–112)
CO2: 33 meq/L — AB (ref 19–32)
Creatinine, Ser: 0.86 mg/dL (ref 0.40–1.20)
GFR: 66.5 mL/min (ref >60.00)
Glucose, Bld: 108 mg/dL — ABNORMAL HIGH (ref 70–99)
Potassium: 4.3 mEq/L (ref 3.5–5.1)
Sodium: 140 mEq/L (ref 135–145)
Total Protein: 7.1 g/dL (ref 6.0–8.3)

## 2017-03-03 LAB — LIPID PANEL
CHOL/HDL RATIO: 5
Cholesterol: 201 mg/dL — ABNORMAL HIGH (ref 0–200)
HDL: 41.6 mg/dL (ref >39.00)
LDL CALC: 140 mg/dL — AB (ref 0–99)
NonHDL: 159.87
TRIGLYCERIDES: 97 mg/dL (ref 0.0–149.0)
VLDL: 19.4 mg/dL (ref 0.0–40.0)

## 2017-03-03 LAB — VITAMIN D 25 HYDROXY (VIT D DEFICIENCY, FRACTURES): VITD: 29.91 ng/mL — ABNORMAL LOW (ref 30.00–100.00)

## 2017-03-03 LAB — HEMOGLOBIN A1C: HEMOGLOBIN A1C: 6.1 % (ref 4.6–6.5)

## 2017-03-03 NOTE — Progress Notes (Signed)
I reviewed health advisor's note, was available for consultation, and agree with documentation and plan.   Signed,  Jayline Kilburg T. Paarth Cropper, MD  

## 2017-03-03 NOTE — Progress Notes (Signed)
Pre visit review using our clinic review tool, if applicable. No additional management support is needed unless otherwise documented below in the visit note. 

## 2017-03-03 NOTE — Progress Notes (Signed)
Subjective:   Angela Ali is a 83 y.o. female who presents for Medicare Annual (Subsequent) preventive examination.  Review of Systems:  N/A Cardiac Risk Factors include: advanced age (>68men, >73 women);diabetes mellitus;dyslipidemia;hypertension;obesity (BMI >30kg/m2)     Objective:     Vitals: BP 118/72 (BP Location: Right Arm, Patient Position: Sitting, Cuff Size: Normal)   Pulse 83   Temp 97.6 F (36.4 C) (Oral)   Ht 5' 2.5" (1.588 m) Comment: no shoes  Wt 183 lb 4 oz (83.1 kg)   SpO2 95%   BMI 32.98 kg/m   Body mass index is 32.98 kg/m.   Tobacco Social History   Tobacco Use  Smoking Status Never Smoker  Smokeless Tobacco Never Used     Counseling given: No   Past Medical History:  Diagnosis Date  . Allergic rhinitis, cause unspecified   . Atrial fibrillation ([place])   . Atrial flutter ([place])   . Bacterial pneumonia, unspecified   . Colonic polyp   . Congestive heart failure, unspecified   . Diabetes mellitus without complication (HCC)    borderline-checks sugars but no meds  . Diaphragmatic hernia without mention of obstruction or gangrene   . Displacement of lumbar intervertebral disc without myelopathy   . Diverticulosis of colon (without mention of hemorrhage)   . Edema   . Esophageal reflux   . Long term (current) use of anticoagulants   . Osteoarthrosis, unspecified whether generalized or localized, unspecified site   . Other abnormal glucose   . Other and unspecified hyperlipidemia   . Other malaise and fatigue   . Overweight(278.02)   . Prediabetes   . Secondary cardiomyopathy, unspecified   . Splenic mass 08/21/2011  . Stricture and stenosis of esophagus   . Thigh abscess   . Unspecified essential hypertension    Past Surgical History:  Procedure Laterality Date  . ABDOMINAL HYSTERECTOMY  1971   partial  . ANKLE SURGERY Left 2001  . CHOLECYSTECTOMY  2004  . COLONOSCOPY    . ESOPHAGEAL DILATION  01/2007  . POLYPECTOMY    . spleen  removed     2005 or 2006 in [place]   Family History  Problem Relation Age of Onset  . Emphysema Father 13  . Coronary artery disease Mother 102  . Hypertension Mother 58  . Ovarian cancer Daughter   . Bone cancer Son        bone marrow ca  . Hyperlipidemia Sister        # 1  . Hyperlipidemia Sister        # 2  . Diabetes Daughter        # 1  . Hypertension Daughter   . Diabetes Daughter        # 2   . Hypertension Daughter   . Colon cancer Neg Hx    Social History   Substance and Sexual Activity  Sexual Activity No    Outpatient Encounter Medications as of 03/03/2017  Medication Sig  . ACCU-CHEK FASTCLIX LANCETS MISC 1 strip by Does not apply route 2 (two) times daily as needed. dx 250.90  . acetaminophen (TYLENOL ARTHRITIS PAIN) 650 MG CR tablet Take 650 mg by mouth daily.  . Ascorbic Acid (VITAMIN C PO) Take 1 tablet by mouth daily.  Marland Kitchen atorvastatin (LIPITOR) 80 MG tablet Take 1 tablet (80 mg total) by mouth daily.  . Azelastine HCl 137 MCG/SPRAY SOLN Place 1-2 sprays into the nose 2 (two) times daily.  . benzonatate (  TESSALON) 100 MG capsule TAKE TWO CAPSULES BY MOUTH TWICE DAILY AS NEEDED FOR COUGH  . Cholecalciferol (VITAMIN D-3 PO) Take 1 tablet by mouth daily.  . cloNIDine (CATAPRES) 0.1 MG tablet TAKE 1/2 TABLET TWICE DAILY  . cyclobenzaprine (FLEXERIL) 5 MG tablet Take 1 tablet (5 mg total) by mouth at bedtime.  . digoxin (LANOXIN) 0.125 MG tablet TAKE 1 TABLET EVERY DAY  . fexofenadine (ALLEGRA) 180 MG tablet Take 1 tablet (180 mg total) by mouth daily.  . fluticasone (FLONASE) 50 MCG/ACT nasal spray Place 2 sprays into both nostrils daily.  . furosemide (LASIX) 20 MG tablet Take 1 tablet (20 mg total) by mouth daily.  Marland Kitchen glucose blood test strip Use as instructed to check blood sugar 2 (two) times daily dx E11.9  . hydrALAZINE (APRESOLINE) 50 MG tablet Take 1 tablet (50 mg total) by mouth 3 (three) times daily.  . hydrochlorothiazide (HYDRODIURIL) 25 MG tablet  TAKE 1 TABLET EVERY DAY  . latanoprost (XALATAN) 0.005 % ophthalmic solution   . losartan (COZAAR) 100 MG tablet TAKE 1 TABLET EVERY DAY  . metoprolol succinate (TOPROL-XL) 50 MG 24 hr tablet Take 1 tablet (50 mg total) by mouth daily. Take with or immediately following a meal.  . montelukast (SINGULAIR) 10 MG tablet Take 1 tablet (10 mg total) by mouth at bedtime.  Marland Kitchen omeprazole (PRILOSEC) 20 MG capsule TAKE 1 CAPSULE TWICE DAILY BEFORE A MEAL  . potassium chloride SA (K-DUR,KLOR-CON) 20 MEQ tablet Take 1 tablet (20 mEq total) by mouth daily.  . timolol (TIMOPTIC) 0.5 % ophthalmic solution   . traMADol (ULTRAM) 50 MG tablet Take 0.5 tablets (25 mg total) by mouth every 8 (eight) hours as needed.  . warfarin (COUMADIN) 5 MG tablet TAKE AS DIRECTED BY ANTI-COAGULATION CLINIC   No facility-administered encounter medications on file as of 03/03/2017.     Activities of Daily Living In your present state of health, do you have any difficulty performing the following activities: 03/03/2017  Hearing? N  Vision? N  Difficulty concentrating or making decisions? N  Walking or climbing stairs? Y  Dressing or bathing? N  Doing errands, shopping? N  Preparing Food and eating ? N  Using the Toilet? N  In the past six months, have you accidently leaked urine? N  Do you have problems with loss of bowel control? N  Managing your Medications? N  Managing your Finances? N  Housekeeping or managing your Housekeeping? N  Some recent data might be hidden    Patient Care Team: Jinny Sanders, MD as PCP - General Dwain Sarna, OD as Referring Physician (Optometry) Rockey Situ, Kathlene November, MD as Consulting Physician (Cardiology) Inocencio Homes, DPM as Consulting Physician (Podiatry)    Assessment:     Hearing Screening   125Hz  250Hz  500Hz  1000Hz  2000Hz  3000Hz  4000Hz  6000Hz  8000Hz   Right ear:   40 40 40  0    Left ear:   0 40 40  0    Vision Screening Comments: Last vision exam in June 2018 with Dr.  Ellin Mayhew   Exercise Activities and Dietary recommendations Current Exercise Habits: Home exercise routine, Type of exercise: Other - see comments(stationary bike), Time (Minutes): 10, Frequency (Times/Week): 5, Weekly Exercise (Minutes/Week): 50, Intensity: Mild, Exercise limited by: None identified  Goals    Starting 03/03/2017, I will continue to drink at least 5-6 glasses of water daily.     Fall Risk Fall Risk  03/03/2017 11/20/2016 10/10/2015 09/07/2014 09/07/2014  Falls in the past  year? Yes No No No No  Comment pt injured right side after falling in kitchen - - - Simultaneous filing. User may not have seen previous data.  Number falls in past yr: 1 - - - -  Injury with Fall? Yes - - - -   Depression Screen PHQ 2/9 Scores 03/03/2017 11/20/2016 10/10/2015 09/07/2014  PHQ - 2 Score 0 1 0 0  PHQ- 9 Score 2 - - -     Cognitive Function MMSE - Mini Mental State Exam 03/03/2017  Orientation to time 5  Orientation to Place 5  Registration 3  Attention/ Calculation 0  Recall 3  Language- name 2 objects 0  Language- repeat 1  Language- follow 3 step command 3  Language- read & follow direction 0  Write a sentence 0  Copy design 0  Total score 20       PLEASE NOTE: A Mini-Cog screen was completed. Maximum score is 20. A value of 0 denotes this part of Folstein MMSE was not completed or the patient failed this part of the Mini-Cog screening.   Mini-Cog Screening Orientation to Time - Max 5 pts Orientation to Place - Max 5 pts Registration - Max 3 pts Recall - Max 3 pts Language Repeat - Max 1 pts Language Follow 3 Step Command - Max 3 pts   Immunization History  Administered Date(s) Administered  . Influenza Split 02/27/2011, 02/18/2012  . Influenza Whole 01/12/2008, 01/31/2009, 03/05/2010  . Influenza,inj,Quad PF,6+ Mos 02/03/2013, 01/29/2014, 01/18/2015  . Pneumococcal Conjugate-13 08/31/2013  . Pneumococcal Polysaccharide-23 04/28/2007  . Td 02/26/2009   Screening  Tests Health Maintenance  Topic Date Due  . FOOT EXAM  03/05/2017 (Originally 10/09/2016)  . INFLUENZA VACCINE  07/25/2017 (Originally 11/25/2016)  . HEMOGLOBIN A1C  08/31/2017  . MAMMOGRAM  10/13/2017  . OPHTHALMOLOGY EXAM  10/22/2017  . TETANUS/TDAP  02/27/2019  . DEXA SCAN  Completed  . PNA vac Low Risk Adult  Completed      Plan:     I have personally reviewed, addressed, and noted the following in the patient's chart:  A. Medical and social history B. Use of alcohol, tobacco or illicit drugs  C. Current medications and supplements D. Functional ability and status E.  Nutritional status F.  Physical activity G. Advance directives H. List of other physicians I.  Hospitalizations, surgeries, and ER visits in previous 12 months J.  [place] to include hearing, vision, cognitive, depression L. Referrals and appointments - none  In addition, I have reviewed and discussed with patient certain preventive protocols, quality metrics, and best practice recommendations. A written personalized care plan for preventive services as well as general preventive health recommendations were provided to patient.  See attached scanned questionnaire for additional information.   Signed,   Lindell Noe, MHA, BS, LPN Health Coach

## 2017-03-03 NOTE — Patient Instructions (Addendum)
Angela Ali , Thank you for taking time to come for your Medicare Wellness Visit. I appreciate your ongoing commitment to your health goals. Please review the following plan we discussed and let me know if I can assist you in the future.   These are the goals we discussed: Goals    Starting 03/03/2017, I will continue to drink at least 5-6 glasses of water daily.       This is a list of the screening recommended for you and due dates:  Health Maintenance  Topic Date Due  . Complete foot exam   03/05/2017*  . Flu Shot  07/25/2017*  . Hemoglobin A1C  08/31/2017  . Mammogram  10/13/2017  . Eye exam for diabetics  10/22/2017  . Tetanus Vaccine  02/27/2019  . DEXA scan (bone density measurement)  Completed  . Pneumonia vaccines  Completed  *Topic was postponed. The date shown is not the original due date.   Preventive Care for Adults  A healthy lifestyle and preventive care can promote health and wellness. Preventive health guidelines for adults include the following key practices.  . A routine yearly physical is a good way to check with your health care provider about your health and preventive screening. It is a chance to share any concerns and updates on your health and to receive a thorough exam.  . Visit your dentist for a routine exam and preventive care every 6 months. Brush your teeth twice a day and floss once a day. Good oral hygiene prevents tooth decay and gum disease.  . The frequency of eye exams is based on your age, health, family medical history, use  of contact lenses, and other factors. Follow your health care provider's recommendations for frequency of eye exams.  . Eat a healthy diet. Foods like vegetables, fruits, whole grains, low-fat dairy products, and lean protein foods contain the nutrients you need without too many calories. Decrease your intake of foods high in solid fats, added sugars, and salt. Eat the right amount of calories for you. Get information about a  proper diet from your health care provider, if necessary.  . Regular physical exercise is one of the most important things you can do for your health. Most adults should get at least 150 minutes of moderate-intensity exercise (any activity that increases your heart rate and causes you to sweat) each week. In addition, most adults need muscle-strengthening exercises on 2 or more days a week.  Silver Sneakers may be a benefit available to you. To determine eligibility, you may visit the website: www.silversneakers.com or contact program at 872-843-9647 Mon-Fri between 8AM-8PM.   . Maintain a healthy weight. The body mass index (BMI) is a screening tool to identify possible weight problems. It provides an estimate of body fat based on height and weight. Your health care provider can find your BMI and can help you achieve or maintain a healthy weight.   For adults 20 years and older: ? A BMI below 18.5 is considered underweight. ? A BMI of 18.5 to 24.9 is normal. ? A BMI of 25 to 29.9 is considered overweight. ? A BMI of 30 and above is considered obese.   . Maintain normal blood lipids and cholesterol levels by exercising and minimizing your intake of saturated fat. Eat a balanced diet with plenty of fruit and vegetables. Blood tests for lipids and cholesterol should begin at age 58 and be repeated every 5 years. If your lipid or cholesterol levels are high, you  are over 35, or you are at high risk for heart disease, you may need your cholesterol levels checked more frequently. Ongoing high lipid and cholesterol levels should be treated with medicines if diet and exercise are not working.  . If you smoke, find out from your health care provider how to quit. If you do not use tobacco, please do not start.  . If you choose to drink alcohol, please do not consume more than 2 drinks per day. One drink is considered to be 12 ounces (355 mL) of beer, 5 ounces (148 mL) of wine, or 1.5 ounces (44 mL) of  liquor.  . If you are 23-27 years old, ask your health care provider if you should take aspirin to prevent strokes.  . Use sunscreen. Apply sunscreen liberally and repeatedly throughout the day. You should seek shade when your shadow is shorter than you. Protect yourself by wearing long sleeves, pants, a wide-brimmed hat, and sunglasses year round, whenever you are outdoors.  . Once a month, do a whole body skin exam, using a mirror to look at the skin on your back. Tell your health care provider of new moles, moles that have irregular borders, moles that are larger than a pencil eraser, or moles that have changed in shape or color.

## 2017-03-03 NOTE — Progress Notes (Signed)
PCP notes:   Health maintenance:  Flu vaccine - postponed/upper respiratory concerns Foot exam - PCP please address at next appt A1C - completed Eye exam - exam in June 2018 per Dr. Waynetta Sandy office  Abnormal screenings:   Fall risk - hx of falling with injury and medical treatment Depression score: 2 Hearing - failed  Hearing Screening   125Hz  250Hz  500Hz  1000Hz  2000Hz  3000Hz  4000Hz  6000Hz  8000Hz   Right ear:   40 40 40  0    Left ear:   0 40 40  0     Patient concerns:   Patient verbalized pain related to hiatal hernia and back.   Nurse concerns:  None  Next PCP appt:   03/05/17 @ 1000

## 2017-03-05 ENCOUNTER — Encounter: Payer: Medicare PPO | Admitting: Family Medicine

## 2017-03-05 ENCOUNTER — Telehealth: Payer: Self-pay | Admitting: *Deleted

## 2017-03-05 NOTE — Telephone Encounter (Signed)
CPE-2 appointment rescheduled to 04/02/2017 at 10:45 am.

## 2017-03-05 NOTE — Telephone Encounter (Signed)
Have pt reschedule CPX part 2 and we can review then.

## 2017-03-05 NOTE — Telephone Encounter (Signed)
Copied from [place] [id number] [phone]. Topic: Inquiry >> Mar 05, 2017 11:37 AM Angela Ali, NT wrote: Reason for CRM: pt need to talk Doctor [facility] Nurse thank    Left message for Ms. Speedy to call back.  When patient calls back, she can be transferred to [facility] Triage Nurse or get more information why she is asking for a call back.

## 2017-03-05 NOTE — Telephone Encounter (Signed)
Angela Ali cancelled her CPE-2 appointment today and now is calling wanting lab results.  Please advise.

## 2017-03-05 NOTE — Telephone Encounter (Signed)
Pt called back and said she is just wanting to know her lab results. That's all she wanted to know when she called earlier. Didn't send to NT bc I did not see where they could be disclosed with her.

## 2017-03-17 ENCOUNTER — Ambulatory Visit: Payer: Medicare PPO | Admitting: Nurse Practitioner

## 2017-03-19 ENCOUNTER — Other Ambulatory Visit: Payer: Self-pay | Admitting: Cardiovascular Disease

## 2017-03-19 ENCOUNTER — Other Ambulatory Visit: Payer: Self-pay | Admitting: Family Medicine

## 2017-03-22 ENCOUNTER — Ambulatory Visit: Payer: Medicare PPO | Admitting: Family Medicine

## 2017-03-22 ENCOUNTER — Ambulatory Visit: Payer: Self-pay

## 2017-03-22 ENCOUNTER — Encounter: Payer: Self-pay | Admitting: Family Medicine

## 2017-03-22 ENCOUNTER — Telehealth: Payer: Self-pay | Admitting: Family Medicine

## 2017-03-22 VITALS — BP 151/94 | HR 87 | Ht 62.5 in | Wt 184.8 lb

## 2017-03-22 DIAGNOSIS — I10 Essential (primary) hypertension: Secondary | ICD-10-CM

## 2017-03-22 DIAGNOSIS — R222 Localized swelling, mass and lump, trunk: Secondary | ICD-10-CM | POA: Diagnosis not present

## 2017-03-22 DIAGNOSIS — M5441 Lumbago with sciatica, right side: Secondary | ICD-10-CM

## 2017-03-22 MED ORDER — PREDNISONE 50 MG PO TABS: ORAL_TABLET | ORAL | 0 refills | Status: DC

## 2017-03-22 MED ORDER — BLOOD PRESSURE KIT: PACK | 0 refills | Status: DC

## 2017-03-22 MED ORDER — METHYLPREDNISOLONE ACETATE 40 MG/ML IJ SUSP
40.0000 mg | Freq: Once | INTRAMUSCULAR | Status: AC
  Administered 2017-03-22: 40 mg via INTRAMUSCULAR

## 2017-03-22 NOTE — Telephone Encounter (Signed)
Copied from [place] #[id number]. Topic: Appointment Scheduling - Scheduling Inquiry for Clinic >> Mar 22, 2017  9:58 AM Scherrie Gerlach wrote: Reason for CRM: pt states she has a fatty tumor and has been seen by Dr Lorelei Pont for this, but pcp is Dr Diona Browner. Pt states this has really begun to give her pain and hurting.  Pt states it is on her right side.  Pt states it has never hurt this bad before. Pt is requesting to be seen asap. This afternoon if she can.  No appts today or tomorrow. Please  advise

## 2017-03-22 NOTE — Progress Notes (Signed)
    Subjective:  Angela Ali is a 83 y.o. female who presents today with a chief complaint of back pain and back mass.   HPI:  Back pain, acute issue Started about 4 days ago.  Located on her right lower back and radiates into her leg.  Has been stable over that time.  She does have a history of a lipoma on her right lower back that she is concerned Angela be contributing to her symptoms.  She has not noticed any increased size of her lipoma.  No weakness or numbness.  No urinary incontinence, no bowel incontinence, no urinary retention.  No fevers or chills.  No treatments tried.   Hypertension, Chronic Problem BP Readings from Last 3 Encounters:  03/22/17 (!) 151/94  03/03/17 118/72  02/19/17 120/74   Current Medications: Clonidine 0.1 mg daily, hydralazine 50 mg 3 times daily, HCTZ 25 mg daily, losartan 100 mg daily, metoprolol succinate 50 mg daily, compliant without side effects.    ROS: Per HPI  PMH: Smoking history reviewed.  Former smoker.  Objective:  Physical Exam: BP (!) 151/94   Pulse 87   Ht 5' 2.5" (1.588 m)   Wt 184 lb 12.8 oz (83.8 kg)   SpO2 99%   BMI 33.26 kg/m   Gen: NAD, resting comfortably CV: RRR with no murmurs appreciated Pulm: NWOB, CTAB with no crackles, wheezes, or rhonchi MSK:  -Back: Approximately 8 x 4 cm mobile lesion on right lower back with overlying surgical scar.  No fluctuance.  No erythema.  Nontender to palpation.  No other deformities noted.  Spinous processes are nontender to palpation.  She has mild right-sided paraspinal tenderness. -Lower extremities: 2+ edema to knees noted bilaterally.  No other deformities.  Full range of motion.  Strength 5 out of 5 bilaterally.  Sensation light touch intact grossly.  Reflexes 2+ and symmetric bilaterally.  Straight leg raise positive on the right.  Assessment/Plan:  1.  Low back pain with sciatica Patient's symptoms consistent with sciatic pain.  She had an MRI done in March 2016 with evidence  of L3-4 disc bulge with impingement on her L3 nerve root.  Also with some lumbar spine spondylosis.  She does not have any red flag signs or symptoms today.  Given her chronic anticoagulation and cardiac history, she is not a candidate for NSAIDs.  We will give 40 mg IM of Depo-Medrol today and start a prednisone burst.  Discussed side effects with patient including hyperglycemia, insomnia, and lower extremity swelling.  Return precautions reviewed.  2.  Low back mass Consistent with patient's prior diagnosis of lipoma.  It does not seem to be growing in size, however cannot definitively say that this is not contributing to her above symptoms.  Patient and her family request a referral to surgery to discuss surgical removal.  This referral was placed today.  3.  Hypertension Elevated today in setting of acute pain.  Typically well controlled.  Continue current regimen.  Prescription for home blood pressure monitor sent in to patient's pharmacy.  Advised patient to continue checking blood pressure at home and let us or her PCP know if persistently above goal.  Algis Greenhouse. Jerline Pain, MD 03/22/2017 5:03 PM

## 2017-03-22 NOTE — Telephone Encounter (Signed)
Pt to be see at 4:20 today at [facility] with Dimas Chyle MD  Reason for Disposition . SEVERE pain (e.g., excruciating)  Answer Assessment - Initial Assessment Questions 1. APPEARANCE of BOIL: "What does the boil look like?"     Can feel it but cannot see it 2. LOCATION: "Where is the boil located?"      Mid back to the right of the spine 3. NUMBER: "How many boils are there?"      1  4. SIZE: "How big is the boil?" (e.g., inches, cm; compare to size of a coin or other object)     Tangerine sized 5. ONSET: "When did the boil start?"     Saturday 6. PAIN: "Is there any pain?" If so, ask: "How bad is the pain?"   (Scale 1-10; or mild, moderate, severe)     Yes= 8/10 hurts through right  Thigh to the knee can feel it when she coughs 7. FEVER: "Do you have a fever?" If so, ask: "What is it, how was it measured, and when did it start?"      no 8. SOURCE: "Have you been around anyone with boils or other Staph infections?" "Have you ever had boils before?"     No, has had 2 masses before 9. OTHER SYMPTOMS: "Do you have any other symptoms?" (e.g., shaking chills, weakness, rash elsewhere on body)     no 10. PREGNANCY: "Is there any chance you are pregnant?" "When was your last menstrual period?"       n/a  Protocols used: BOIL (SKIN ABSCESS)-A-AH

## 2017-03-22 NOTE — Telephone Encounter (Signed)
Pt triaged. To be seen at 1620 today with Dimas Chyle MD at [facility]

## 2017-03-25 ENCOUNTER — Telehealth: Payer: Self-pay

## 2017-03-25 NOTE — Telephone Encounter (Signed)
Spoke with patient regarding appointment tomorrow.  Patient is currently on prednisone and needs to get in ASAP for INR.  She is feeling dizzy as a result of the medication and I agree that she SHOULD NOT drive.    We discussed for 67minutes the importance of having her INR checked and if she can't come tomorrow, I will work her in Monday but she should not wait another week for a blood check.  Patient really wants to reschedule appointment with [facility] visit for next Friday but this is not safe, best practice.    Patient verbalizes understanding and concerns for keeping appointment and will discuss with her daughter about bringing her.  She wants me to keep appt scheduled for tomorrow and she will call back if there are any concerns.  I, again, reiterated that she should not drive herself if she is feeling dizzy.

## 2017-03-25 NOTE — Telephone Encounter (Signed)
Copied from [place] [id number]. Topic: Appointment Scheduling - Scheduling Inquiry for Clinic >> Mar 25, 2017 11:40 AM Ivar Drape wrote: Reason for CRM: Patient has an appt tomorrow for her INR but she is on medication and cannot drive in for the appt and cannot get anyone to bring her in, so she would like to cancel the 03/26/17 INR appt and reschedule.  We did not know if [facility] preferred rescheduling her own INR appt or not.

## 2017-03-26 ENCOUNTER — Ambulatory Visit (INDEPENDENT_AMBULATORY_CARE_PROVIDER_SITE_OTHER): Payer: Medicare PPO

## 2017-03-26 DIAGNOSIS — Z7901 Long term (current) use of anticoagulants: Secondary | ICD-10-CM | POA: Diagnosis not present

## 2017-03-26 DIAGNOSIS — Z5181 Encounter for therapeutic drug level monitoring: Secondary | ICD-10-CM

## 2017-03-26 LAB — POCT INR: INR: 3

## 2017-03-26 NOTE — Patient Instructions (Signed)
INR today 3.0  Continue taking a half tablet daily except 1 tablet (5mg ) Tues, Thurs, Sat.  Recheck in 1 week at appt next week with PCP.  Patient is taking prednisone and recently recieved a cortisone injection earlier this week.  Will monitor coumadin very carefully in the interim.  She finishes prednisone in 2 days.  Otherwise, patient is doing well and verbalizes understanding of instructions given today.

## 2017-04-02 ENCOUNTER — Encounter: Payer: Medicare PPO | Admitting: Family Medicine

## 2017-04-02 ENCOUNTER — Ambulatory Visit: Payer: Medicare PPO

## 2017-04-06 ENCOUNTER — Ambulatory Visit: Payer: Medicare PPO | Admitting: Nurse Practitioner

## 2017-04-07 ENCOUNTER — Telehealth: Payer: Self-pay | Admitting: Cardiovascular Disease

## 2017-04-07 NOTE — Telephone Encounter (Signed)
Spoke with the patient. She called to report fatigue that she has been feeling and wanted to discuss her medications. We went over her various cardiac medications and what they are used for.  I advised her that with the combination of drugs she is taking, that fatigue may definitely be a side effect from this.  She is also aware that it may be hard to pinpoint just 1 drug that may be causing this for her. She does not check her BP/ HR at home. She is in no acute distress. She is due to see Dr. Diona Browner tomorrow and I have advised her to discuss her fatigue further with Dr. Diona Browner. She is aware that based on her vital signs tomorrow, there may be the potential to decrease/ eliminate some of her medications. She is aware she can also discuss this further with Dr. Rockey Situ in January.  She verbalizes understanding.

## 2017-04-07 NOTE — Telephone Encounter (Signed)
Patient r/s appt due to weather   Changed appt to January and she has some questions about medications.  Unable to get a specific question .  She would like to go over meds in general to be sure she is taking them correctly .

## 2017-04-08 ENCOUNTER — Ambulatory Visit (INDEPENDENT_AMBULATORY_CARE_PROVIDER_SITE_OTHER): Payer: Medicare PPO | Admitting: Family Medicine

## 2017-04-08 ENCOUNTER — Other Ambulatory Visit: Payer: Self-pay

## 2017-04-08 ENCOUNTER — Ambulatory Visit (INDEPENDENT_AMBULATORY_CARE_PROVIDER_SITE_OTHER): Payer: Medicare PPO | Admitting: General Practice

## 2017-04-08 ENCOUNTER — Encounter: Payer: Self-pay | Admitting: Family Medicine

## 2017-04-08 VITALS — BP 130/80 | HR 87 | Temp 98.6°F | Ht 62.5 in | Wt 185.5 lb

## 2017-04-08 DIAGNOSIS — Z5181 Encounter for therapeutic drug level monitoring: Secondary | ICD-10-CM

## 2017-04-08 DIAGNOSIS — I4891 Unspecified atrial fibrillation: Secondary | ICD-10-CM

## 2017-04-08 DIAGNOSIS — E559 Vitamin D deficiency, unspecified: Secondary | ICD-10-CM

## 2017-04-08 DIAGNOSIS — I10 Essential (primary) hypertension: Secondary | ICD-10-CM

## 2017-04-08 DIAGNOSIS — I5032 Chronic diastolic (congestive) heart failure: Secondary | ICD-10-CM

## 2017-04-08 DIAGNOSIS — E118 Type 2 diabetes mellitus with unspecified complications: Secondary | ICD-10-CM

## 2017-04-08 DIAGNOSIS — Z23 Encounter for immunization: Secondary | ICD-10-CM

## 2017-04-08 DIAGNOSIS — E7800 Pure hypercholesterolemia, unspecified: Secondary | ICD-10-CM | POA: Diagnosis not present

## 2017-04-08 DIAGNOSIS — R5383 Other fatigue: Secondary | ICD-10-CM | POA: Diagnosis not present

## 2017-04-08 DIAGNOSIS — E6609 Other obesity due to excess calories: Secondary | ICD-10-CM | POA: Diagnosis not present

## 2017-04-08 DIAGNOSIS — Z0000 Encounter for general adult medical examination without abnormal findings: Secondary | ICD-10-CM | POA: Diagnosis not present

## 2017-04-08 DIAGNOSIS — Z6833 Body mass index (BMI) 33.0-33.9, adult: Secondary | ICD-10-CM

## 2017-04-08 DIAGNOSIS — Z7901 Long term (current) use of anticoagulants: Secondary | ICD-10-CM | POA: Diagnosis not present

## 2017-04-08 DIAGNOSIS — I429 Cardiomyopathy, unspecified: Secondary | ICD-10-CM

## 2017-04-08 LAB — HM DIABETES FOOT EXAM

## 2017-04-08 LAB — POCT INR: INR: 3.4

## 2017-04-08 NOTE — Progress Notes (Signed)
Subjective:    Patient ID: Angela Ali, female    DOB: 03-14-1929, 83 y.o.   MRN: [id number]  HPI  The patient presents for  complete physical and review of chronic health problems. He/She also has the following acute concerns today: fatigue  The patient saw Candis Musa, LPN for medicare wellness. Note reviewed in detail and important notes copied below. Health maintenance:  Flu vaccine - postponed/upper respiratory concerns Foot exam - PCP please address at next appt A1C - completed Eye exam - exam in June 2018 per Dr. Waynetta Sandy office  Abnormal screenings:  Fall risk - hx of falling with injury and medical treatment Depression score: 2 Hearing - failed             Hearing Screening   125Hz  250Hz  500Hz  1000Hz  2000Hz  3000Hz  4000Hz  6000Hz  8000Hz   Right ear:   40 40 40  0    Left ear:   0 40 40  0    Patient concerns:  Patient verbalized pain related to hiatal hernia and back.    04/08/17 Today:   She has been feeling tired more lately in last 1-2 month. No increase in SOB.  No chest pain. Occ feeling heart rate beating faster.   Diabetes:   Good control Lab Results  Component Value Date   HGBA1C 6.1 03/03/2017  Using medications without difficulties: Hypoglycemic episodes: none Hyperglycemic episodes: none Feet problems: no ulcers Blood Sugars averaging: FBS 103 eye exam within last year: Pt reports she has had this in last 1-2 month  Body mass index is 33.39 kg/m.  Cardiomyopathy, CHF, diastolic and systolic on BALDs therapy, afib: followed by cardiology, stable on  Coumadin anticoagulation  Euvolemic on lasix 20 mg daily.  HTN, well controlled on current regimen  BP Readings from Last 3 Encounters:  04/08/17 130/80  03/22/17 (!) 151/94  03/03/17 118/72   Elevated Cholesterol:  Inadequate control on  lipitor 80 mg daily.. But she reports she is no longer taking daily. Lab Results  Component Value Date   CHOL 201 (H) 03/03/2017   HDL 41.60 03/03/2017   LDLCALC 140 (H) 03/03/2017   TRIG 97.0 03/03/2017   CHOLHDL 5 03/03/2017  Using medications without problems: Muscle aches:  Diet compliance: moderate Exercise: none Other complaints:   Vit D was low.. Not repleting.  Social History /Family History/Past Medical History reviewed in detail and updated in EMR if needed. Blood pressure 130/80, pulse 87, temperature 98.6 F (37 C), temperature source Oral, height 5' 2.5" (1.588 m), weight 185 lb 8 oz (84.1 kg).  EKG: unchanged from previous tracings.  Review of Systems  Constitutional: Positive for fatigue. Negative for fever.  HENT: Negative for congestion.   Eyes: Negative for pain.  Respiratory: Negative for cough and shortness of breath.   Cardiovascular: Negative for chest pain, palpitations and leg swelling.  Gastrointestinal: Negative for abdominal pain.  Genitourinary: Negative for dysuria and vaginal bleeding.  Musculoskeletal: Negative for back pain.  Neurological: Negative for syncope, light-headedness and headaches.  Psychiatric/Behavioral: Negative for dysphoric mood.       Objective:   Physical Exam  Constitutional: Vital signs are normal. She appears well-developed and well-nourished. She is cooperative.  Non-toxic appearance. She does not appear ill. No distress.  obese  HENT:  Head: Normocephalic.  Right Ear: Hearing, tympanic membrane, external ear and ear canal normal. Tympanic membrane is not erythematous, not retracted and not bulging.  Left Ear: Hearing, tympanic membrane, external ear and ear canal normal.  Tympanic membrane is not erythematous, not retracted and not bulging.  Nose: Mucosal edema and rhinorrhea present. Right sinus exhibits no maxillary sinus tenderness and no frontal sinus tenderness. Left sinus exhibits no maxillary sinus tenderness and no frontal sinus tenderness.  Mouth/Throat: Uvula is midline, oropharynx is clear and moist and mucous membranes are normal.  Eyes:  Conjunctivae, EOM and lids are normal. Pupils are equal, round, and reactive to light. Lids are everted and swept, no foreign bodies found.  Neck: Trachea normal and normal range of motion. Neck supple. Carotid bruit is not present. No thyroid mass and no thyromegaly present.  Cardiovascular: Normal rate, regular rhythm, S1 normal, S2 normal, normal heart sounds, intact distal pulses and normal pulses. Exam reveals no gallop and no friction rub.  No murmur heard.  1 plus pitting edema  In left ankle where past injury was... chronic  Pulmonary/Chest: Effort normal and breath sounds normal. No tachypnea. No respiratory distress. She has no decreased breath sounds. She has no wheezes. She has no rhonchi. She has no rales.  Musculoskeletal:       Right hip: Normal.       Left hip: Normal.       Thoracic back: Normal.       Lumbar back: Normal.  Neurological: She is alert.  Skin: Skin is warm, dry and intact. No rash noted.  Psychiatric: Her speech is normal and behavior is normal. Judgment normal. Her mood appears not anxious. Cognition and memory are normal. She does not exhibit a depressed mood.      Diabetic foot exam: Normal inspection No skin breakdown No calluses  Normal DP pulses Normal sensation to light touch and monofilament Nails normal     Assessment & Plan:  The patient's preventative maintenance and recommended screening tests for an annual wellness exam were reviewed in full today. Brought up to date unless services declined.  Counselled on the importance of diet, exercise, and its role in overall health and mortality. The patient's FH and SH was reviewed, including their home life, tobacco status, and drug and alcohol status.   Vaccines:Uptodate PNA and Td, considering shingles vaccine,  Due for flu today. No indication for pap, DVE given age, hysterectomy.  Mammo: No family history of first degree relative. She wishes to continue mammograms. 09/2016 stable Colon:  Nml in 2009, per Dr. Deatra Ina, had nml cologuard in 05/2014. Nonsmoker.  DEXA: Stable osteopenia, 07/2014, Repeat 5 years.

## 2017-04-08 NOTE — Patient Instructions (Signed)
Pre visit review using our clinic review tool, if applicable. No additional management support is needed unless otherwise documented below in the visit note. 

## 2017-04-08 NOTE — Patient Instructions (Addendum)
Start 400 IU of vit D 3 daily to twice daily.  Restart atorvastatin daily.  No napping during the day.  Use flexeril only at night as it can cause fatigue.

## 2017-04-09 DIAGNOSIS — R5383 Other fatigue: Secondary | ICD-10-CM | POA: Insufficient documentation

## 2017-04-09 NOTE — Assessment & Plan Note (Signed)
Euvolemic on lasix.

## 2017-04-09 NOTE — Assessment & Plan Note (Signed)
Good control on no medication. Encouraged exercise, weight loss, healthy eating habits.

## 2017-04-09 NOTE — Assessment & Plan Note (Signed)
Followed by cardiology 

## 2017-04-09 NOTE — Assessment & Plan Note (Signed)
Well controlled. Continue current medication.  

## 2017-04-09 NOTE — Assessment & Plan Note (Addendum)
Followed by cards. Rate controlled on coumadin for anticoagulation.  EKG stable today.

## 2017-04-09 NOTE — Assessment & Plan Note (Signed)
Replete

## 2017-04-09 NOTE — Assessment & Plan Note (Signed)
Likely multifactorial.  In part may be due to low vit D, medication SE, age, inactivity, no new change in afib per EKG today.  If allergy meds not helping she can stop if she feels they contribute to fatigue

## 2017-04-09 NOTE — Assessment & Plan Note (Signed)
Inadequate control but stopped statin given media.  She will restart this.

## 2017-04-13 ENCOUNTER — Encounter: Payer: Self-pay | Admitting: Family Medicine

## 2017-04-22 ENCOUNTER — Ambulatory Visit (INDEPENDENT_AMBULATORY_CARE_PROVIDER_SITE_OTHER): Payer: Medicare PPO | Admitting: General Practice

## 2017-04-22 DIAGNOSIS — Z7901 Long term (current) use of anticoagulants: Secondary | ICD-10-CM | POA: Diagnosis not present

## 2017-04-22 LAB — POCT INR: INR: 2.8

## 2017-04-22 NOTE — Patient Instructions (Addendum)
Pre visit review using our clinic review tool, if applicable. No additional management support is needed unless otherwise documented below in the visit note.  Continue taking a half tablet daily except 1 tablet (5mg ) Tues, Thurs, Sat.  Recheck in 3 weeks.

## 2017-05-08 NOTE — Progress Notes (Signed)
Cardiology Office Note  Date:  05/11/2017   ID:  Angela Ali, DOB [birth year], MRN [id number]  PCP:  Jinny Sanders, MD   Chief Complaint  Patient presents with  . OTHER    LS 2017 c/o unbalanced, fatigue and back pain. Meds reviewed verbally with pt.    HPI:  Angela Ali is a very pleasant 84 year old woman with a history of  chronic atrial fibrillation on warfarin,  hypertension,  chronic lower extremity edema,  shortness of breath,  who presents for routine followup of her atrial fibrillation  And fatigue  On her last clinic visit reported having fatigue Noted to have bradycardia, we decrease the metoprolol from 100 down to 50 mg daily On today's visit still with fatigue Reports sleeping adequate amount No regular exercise program Checks blood pressure at home, reports it is typically well controlled Weight is stable  Back pain, chronic issue Knee pain Takes Lasix as needed  Active, does not like to use her cane, no recent falls No regular exercise, goes to bed late Lab work reviewed in detail with her AIC 6.2 Total chol 184, LDL 112  EKG personally reviewed by myself on todays visit Atrial fibrillation with ventricular rate 75 bpm nonspecific ST abnormality, left axis deviation  Other past medical history reviewed Previous  fall with hematoma of the right lower extremity,  required care from the wound clinic for a large ulcerative wound on the right lateral aspect of her leg. This has healed well. History of medication confusion, some noncompliance. Prior trauma to her right lower extremity, surgery to her left ankle  Echo 08/2009 EF 45 to 50%   Calcium channel blocker/diltiazem was held in the past secondary to edema.    PMH:   has a past medical history of Allergic rhinitis, cause unspecified, Atrial fibrillation ([place]), Atrial flutter ([place]), Bacterial pneumonia, unspecified, Colonic polyp, Congestive heart failure, unspecified, Diabetes mellitus without  complication ([place]), Diaphragmatic hernia without mention of obstruction or gangrene, Displacement of lumbar intervertebral disc without myelopathy, Diverticulosis of colon (without mention of hemorrhage), Edema, Esophageal reflux, Long term (current) use of anticoagulants, Osteoarthrosis, unspecified whether generalized or localized, unspecified site, Other abnormal glucose, Other and unspecified hyperlipidemia, Other malaise and fatigue, Overweight(278.02), Prediabetes, Secondary cardiomyopathy, unspecified, Splenic mass (08/21/2011), Stricture and stenosis of esophagus, Thigh abscess, and Unspecified essential hypertension.  PSH:    Past Surgical History:  Procedure Laterality Date  . ABDOMINAL HYSTERECTOMY  1971   partial  . ANKLE SURGERY Left 2001  . CHOLECYSTECTOMY  2004  . COLONOSCOPY    . ESOPHAGEAL DILATION  01/2007  . POLYPECTOMY    . spleen removed     2005 or 2006 in [place]    Current Outpatient Medications  Medication Sig Dispense Refill  . ACCU-CHEK FASTCLIX LANCETS MISC 1 strip by Does not apply route 2 (two) times daily as needed. dx 250.90 306 each 1  . acetaminophen (TYLENOL ARTHRITIS PAIN) 650 MG CR tablet Take 650 mg by mouth daily.    . Ascorbic Acid (VITAMIN C PO) Take 1 tablet by mouth daily.    Marland Kitchen atorvastatin (LIPITOR) 80 MG tablet Take 80 mg by mouth daily.     . Azelastine HCl 137 MCG/SPRAY SOLN Place 1-2 sprays into the nose 2 (two) times daily. 30 mL 11  . benzonatate (TESSALON) 100 MG capsule TAKE TWO CAPSULES BY MOUTH TWICE DAILY AS NEEDED FOR COUGH 120 capsule 0  . Blood Pressure KIT Use daily as needed to check blood  pressure. 1 each 0  . Cholecalciferol (VITAMIN D-3 PO) Take 1 tablet by mouth daily.    . cloNIDine (CATAPRES) 0.1 MG tablet TAKE 1/2 TABLET TWICE DAILY 90 tablet 3  . cyclobenzaprine (FLEXERIL) 5 MG tablet Take 1 tablet (5 mg total) by mouth at bedtime. 30 tablet 0  . digoxin (LANOXIN) 0.125 MG tablet TAKE 1 TABLET EVERY DAY 90 tablet 0  .  fexofenadine (ALLEGRA) 180 MG tablet Take 1 tablet (180 mg total) by mouth daily. 90 tablet 3  . fluticasone (FLONASE) 50 MCG/ACT nasal spray Place 2 sprays into both nostrils daily. 48 g 1  . furosemide (LASIX) 20 MG tablet Take 1 tablet (20 mg total) by mouth daily. 90 tablet 1  . glucose blood test strip Use as instructed to check blood sugar 2 (two) times daily dx E11.9 150 each 3  . hydrALAZINE (APRESOLINE) 50 MG tablet Take 1 tablet (50 mg total) by mouth 3 (three) times daily. 270 tablet 1  . hydrochlorothiazide (HYDRODIURIL) 25 MG tablet TAKE 1 TABLET EVERY DAY 90 tablet 3  . latanoprost (XALATAN) 0.005 % ophthalmic solution     . losartan (COZAAR) 100 MG tablet TAKE 1 TABLET EVERY DAY 90 tablet 0  . metoprolol succinate (TOPROL-XL) 50 MG 24 hr tablet Take 1 tablet (50 mg total) by mouth daily. Take with or immediately following a meal. 90 tablet 1  . montelukast (SINGULAIR) 10 MG tablet Take 1 tablet (10 mg total) by mouth at bedtime. 90 tablet 3  . omeprazole (PRILOSEC) 20 MG capsule TAKE 1 CAPSULE TWICE DAILY BEFORE A MEAL 180 capsule 1  . potassium chloride SA (K-DUR,KLOR-CON) 20 MEQ tablet Take 1 tablet (20 mEq total) by mouth daily. 90 tablet 3  . timolol (TIMOPTIC) 0.5 % ophthalmic solution     . traMADol (ULTRAM) 50 MG tablet Take 0.5 tablets (25 mg total) by mouth every 8 (eight) hours as needed. 60 tablet 0  . warfarin (COUMADIN) 5 MG tablet TAKE AS DIRECTED  BY  ANTI  COAGULATION  CLINIC 90 tablet 0   No current facility-administered medications for this visit.      Allergies:   Morphine and related; Percocet [oxycodone-acetaminophen]; and Tramadol   Social History:  The patient  reports that  has never smoked. she has never used smokeless tobacco. She reports that she does not drink alcohol or use drugs.   Family History:   family history includes Bone cancer in her son; Coronary artery disease (age of onset: 51) in her mother; Diabetes in her daughter and daughter;  Emphysema (age of onset: 20) in her father; Hyperlipidemia in her sister and sister; Hypertension in her daughter and daughter; Hypertension (age of onset: 78) in her mother; Ovarian cancer in her daughter.    Review of Systems: Review of Systems  Constitutional: Positive for malaise/fatigue.  Respiratory: Negative.   Cardiovascular: Negative.   Gastrointestinal: Negative.   Musculoskeletal: Positive for joint pain and myalgias.       Muscle cramping  Neurological: Negative.   Psychiatric/Behavioral: Negative.   All other systems reviewed and are negative.    PHYSICAL EXAM: VS:  BP 120/88 (BP Location: Left Arm, Patient Position: Sitting, Cuff Size: Large)   Pulse 75   Ht 5' 4.5" (1.638 m)   Wt 181 lb (82.1 kg)   BMI 30.59 kg/m  , BMI Body mass index is 30.59 kg/m. GEN: Well nourished, well developed, in no acute distress  HEENT: normal  Neck: no JVD, carotid bruits,  or masses Cardiac: Irregularly irregular, no murmurs, rubs, or gallops, trace lower extremity nonpitting edema  Respiratory:  clear to auscultation bilaterally, normal work of breathing GI: soft, nontender, nondistended, + BS MS: no deformity or atrophy  Skin: warm and dry, no rash Neuro:  Strength and sensation are intact Psych: euthymic mood, full affect    Recent Labs: 03/03/2017: ALT 11; BUN 17; Creatinine, Ser 0.86; Potassium 4.3; Sodium 140    Lipid Panel Lab Results  Component Value Date   CHOL 201 (H) 03/03/2017   HDL 41.60 03/03/2017   LDLCALC 140 (H) 03/03/2017   TRIG 97.0 03/03/2017      Wt Readings from Last 3 Encounters:  05/11/17 181 lb (82.1 kg)  04/08/17 185 lb 8 oz (84.1 kg)  03/22/17 184 lb 12.8 oz (83.8 kg)       ASSESSMENT AND PLAN:  Atrial fibrillation, unspecified type ([place]) - Plan: EKG 12-Lead Chronic atrial fibrillation, rate well controlled, tolerating anticoagulation on warfarin Tolerating metoprolol 50, takes this at night  Chronic diastolic CHF (congestive heart  failure) ([place]) - Plan: EKG 12-Lead Using Lasix only as needed, weight is stable  Chronic fatigue Etiology unclear, recommended she experiment and hold the clonidine If blood pressure runs high we could increase hydralazine up to 75 3 times a day  Hypertension Medication changes as above in effort to improve her fatigue and general malaise  Hyperlipidemia Cholesterol running high Stressed the importance of compliance of her statin, Lipitor  Morbid obesity Stable weight     Total encounter time more than 25 minutes  Greater than 50% was spent in counseling and coordination of care with the patient  Disposition:   F/U  6 months   Orders Placed This Encounter  Procedures  . EKG 12-Lead  . EKG 12-Lead     Signed, Esmond Plants, M.D., Ph.D. 05/11/2017  [place], [place]

## 2017-05-10 ENCOUNTER — Ambulatory Visit: Payer: Medicare PPO | Admitting: Physician Assistant

## 2017-05-11 ENCOUNTER — Ambulatory Visit (INDEPENDENT_AMBULATORY_CARE_PROVIDER_SITE_OTHER): Payer: Medicare PPO | Admitting: Cardiovascular Disease

## 2017-05-11 ENCOUNTER — Encounter: Payer: Self-pay | Admitting: Cardiovascular Disease

## 2017-05-11 VITALS — BP 120/88 | HR 75 | Ht 64.5 in | Wt 181.0 lb

## 2017-05-11 DIAGNOSIS — E118 Type 2 diabetes mellitus with unspecified complications: Secondary | ICD-10-CM

## 2017-05-11 DIAGNOSIS — I5032 Chronic diastolic (congestive) heart failure: Secondary | ICD-10-CM | POA: Diagnosis not present

## 2017-05-11 DIAGNOSIS — I482 Chronic atrial fibrillation: Secondary | ICD-10-CM

## 2017-05-11 DIAGNOSIS — I10 Essential (primary) hypertension: Secondary | ICD-10-CM | POA: Diagnosis not present

## 2017-05-11 DIAGNOSIS — E782 Mixed hyperlipidemia: Secondary | ICD-10-CM | POA: Diagnosis not present

## 2017-05-11 DIAGNOSIS — I4821 Permanent atrial fibrillation: Secondary | ICD-10-CM

## 2017-05-11 NOTE — Patient Instructions (Addendum)
Medication Instructions:   Hold the clonidine Monitor blood pressure If it runs high, >150 Increase the hydralazine up to 1 1/2 pill three times a day  Labwork:  No new labs needed  Testing/Procedures:  No further testing at this time   Follow-Up: It was a pleasure seeing you in the office today. Please call us if you have new issues that need to be addressed before your next appt.  [phone]  Your physician wants you to follow-up in: 6 months.  You will receive a reminder letter in the mail two months in advance. If you don't receive a letter, please call our office to schedule the follow-up appointment.  If you need a refill on your cardiac medications before your next appointment, please call your pharmacy.

## 2017-05-13 ENCOUNTER — Ambulatory Visit: Payer: Medicare PPO

## 2017-05-13 ENCOUNTER — Ambulatory Visit (INDEPENDENT_AMBULATORY_CARE_PROVIDER_SITE_OTHER): Payer: Medicare PPO | Admitting: General Practice

## 2017-05-13 DIAGNOSIS — Z7901 Long term (current) use of anticoagulants: Secondary | ICD-10-CM | POA: Diagnosis not present

## 2017-05-13 LAB — POCT INR: INR: 3.4

## 2017-05-13 NOTE — Patient Instructions (Addendum)
Pre visit review using our clinic review tool, if applicable. No additional management support is needed unless otherwise documented below in the visit note.  Skip coumadin today and then start taking 1/2 tablet daily except 1 tablet Tues and Saturdays.  Re-check in 3 weeks.

## 2017-05-18 ENCOUNTER — Telehealth: Payer: Self-pay | Admitting: Internal Medicine

## 2017-05-18 ENCOUNTER — Other Ambulatory Visit: Payer: Self-pay

## 2017-05-18 MED ORDER — LOSARTAN POTASSIUM 100 MG PO TABS: 100.0000 mg | ORAL_TABLET | Freq: Every day | ORAL | 3 refills | Status: DC

## 2017-05-18 MED ORDER — HYDROCHLOROTHIAZIDE 25 MG PO TABS: 25.0000 mg | ORAL_TABLET | Freq: Every day | ORAL | 3 refills | Status: DC

## 2017-05-18 MED ORDER — DIGOXIN 125 MCG PO TABS: 125.0000 ug | ORAL_TABLET | Freq: Every day | ORAL | 3 refills | Status: DC

## 2017-05-18 MED ORDER — CLONIDINE HCL 0.1 MG PO TABS: 0.0500 mg | ORAL_TABLET | Freq: Two times a day (BID) | ORAL | 3 refills | Status: DC

## 2017-05-18 MED ORDER — HYDRALAZINE HCL 50 MG PO TABS: 50.0000 mg | ORAL_TABLET | Freq: Three times a day (TID) | ORAL | 3 refills | Status: DC

## 2017-05-18 NOTE — Telephone Encounter (Signed)
Please call regarding cardiac meds refills, pt is not sure the names.

## 2017-05-20 ENCOUNTER — Encounter: Payer: Medicare PPO | Admitting: Family Medicine

## 2017-06-03 ENCOUNTER — Ambulatory Visit: Payer: Medicare PPO | Admitting: General Practice

## 2017-06-03 DIAGNOSIS — Z7901 Long term (current) use of anticoagulants: Secondary | ICD-10-CM

## 2017-06-03 LAB — POCT INR: INR: 2.3

## 2017-06-03 NOTE — Patient Instructions (Addendum)
Pre visit review using our clinic review tool, if applicable. No additional management support is needed unless otherwise documented below in the visit note.  Continue to take 1/2 tablet daily except 1 tablet Tues and Saturdays.  Re-check in 4 weeks.   

## 2017-06-08 ENCOUNTER — Encounter: Payer: Self-pay | Admitting: Family Medicine

## 2017-06-08 ENCOUNTER — Other Ambulatory Visit: Payer: Self-pay | Admitting: Family Medicine

## 2017-06-08 ENCOUNTER — Ambulatory Visit (INDEPENDENT_AMBULATORY_CARE_PROVIDER_SITE_OTHER): Payer: Medicare PPO | Admitting: Family Medicine

## 2017-06-08 ENCOUNTER — Other Ambulatory Visit: Payer: Self-pay

## 2017-06-08 VITALS — BP 161/89 | HR 62 | Temp 99.0°F | Ht 62.5 in | Wt 183.8 lb

## 2017-06-08 DIAGNOSIS — J301 Allergic rhinitis due to pollen: Secondary | ICD-10-CM

## 2017-06-08 DIAGNOSIS — R0982 Postnasal drip: Secondary | ICD-10-CM | POA: Diagnosis not present

## 2017-06-08 DIAGNOSIS — J4540 Moderate persistent asthma, uncomplicated: Secondary | ICD-10-CM | POA: Diagnosis not present

## 2017-06-08 MED ORDER — BUDESONIDE-FORMOTEROL FUMARATE 80-4.5 MCG/ACT IN AERO: 2.0000 | INHALATION_SPRAY | Freq: Two times a day (BID) | RESPIRATORY_TRACT | 3 refills | Status: DC

## 2017-06-08 MED ORDER — AZELASTINE HCL 137 MCG/SPRAY NA SOLN: 1.0000 | Freq: Two times a day (BID) | NASAL | 11 refills | Status: DC

## 2017-06-08 MED ORDER — FLUTICASONE PROPIONATE 50 MCG/ACT NA SUSP: 2.0000 | Freq: Every day | NASAL | 3 refills | Status: DC

## 2017-06-08 NOTE — Progress Notes (Signed)
   Subjective:    Patient ID: Angela Ali, female    DOB: June 18, 1928, 84 y.o.   MRN: [id number]  Cough  This is a new problem. The problem has been unchanged. The cough is non-productive. Associated symptoms include nasal congestion and postnasal drip. Pertinent negatives include no chills, ear congestion, ear pain, fever, headaches, hemoptysis, sore throat, shortness of breath or wheezing. Associated symptoms comments:  Chronic nasal drainage, PND  no face pain or pressure. The symptoms are aggravated by lying down. Risk factors: nonsmoker. Treatments tried: singulair, allegra, flnase. The treatment provided mild relief. Her past medical history is significant for asthma and environmental allergies.    In past advair helped.. Cannot afford.  nonsmoker  Review of Systems  Constitutional: Negative for chills and fever.  HENT: Positive for postnasal drip. Negative for ear pain and sore throat.   Respiratory: Positive for cough. Negative for hemoptysis, shortness of breath and wheezing.   Allergic/Immunologic: Positive for environmental allergies.  Neurological: Negative for headaches.       Objective:   Physical Exam  Constitutional: Vital signs are normal. She appears well-developed and well-nourished. She is cooperative.  Non-toxic appearance. She does not appear ill. No distress.  HENT:  Head: Normocephalic.  Right Ear: Hearing, tympanic membrane, external ear and ear canal normal. Tympanic membrane is not erythematous, not retracted and not bulging.  Left Ear: Hearing, tympanic membrane, external ear and ear canal normal. Tympanic membrane is not erythematous, not retracted and not bulging.  Nose: Mucosal edema and rhinorrhea present. Right sinus exhibits no maxillary sinus tenderness and no frontal sinus tenderness. Left sinus exhibits no maxillary sinus tenderness and no frontal sinus tenderness.  Mouth/Throat: Uvula is midline, oropharynx is clear and moist and mucous membranes  are normal.  Eyes: Conjunctivae, EOM and lids are normal. Pupils are equal, round, and reactive to light. Lids are everted and swept, no foreign bodies found.  Neck: Trachea normal and normal range of motion. Neck supple. Carotid bruit is not present. No thyroid mass and no thyromegaly present.  Cardiovascular: Normal rate, S1 normal, S2 normal, normal heart sounds, intact distal pulses and normal pulses. An irregularly irregular rhythm present. Exam reveals no gallop and no friction rub.  No murmur heard. Pulmonary/Chest: Effort normal and breath sounds normal. No tachypnea. No respiratory distress. She has no decreased breath sounds. She has no wheezes. She has no rhonchi. She has no rales.  Neurological: She is alert.  Skin: Skin is warm, dry and intact. No rash noted.  Psychiatric: Her speech is normal and behavior is normal. Judgment normal. Her mood appears not anxious. Cognition and memory are normal. She does not exhibit a depressed mood.          Assessment & Plan:

## 2017-06-08 NOTE — Telephone Encounter (Signed)
Please ask pt to call or look on insurance formulary for asthma controller med coverage.. Let me know what is covered.

## 2017-06-08 NOTE — Telephone Encounter (Signed)
Copied from [place]. Topic: Quick Communication - Rx Refill/Question >> Jun 08, 2017  3:14 PM Oliver Pila B wrote: Medication: budesonide-formoterol (SYMBICORT) 80-4.5 MCG/ACT inhaler [[id number]]   Rx is too expensive needs an alternate choice, contact pt to advise

## 2017-06-08 NOTE — Telephone Encounter (Signed)
Ms. Swing notified as instructed by telephone.  She will call [employer] and let us know what she finds out.

## 2017-06-08 NOTE — Assessment & Plan Note (Signed)
Will see if symbicort is more affordable.. This would like help her DOE chronically.

## 2017-06-08 NOTE — Patient Instructions (Addendum)
Continue flonase, singulair, allegra.  Add anticholinergic nasal spray and try a trial of symbicort.

## 2017-06-08 NOTE — Assessment & Plan Note (Signed)
Will  Add nasal anticholinergic to treat long term PND symtpoms in addition to current regimen.

## 2017-06-10 NOTE — Telephone Encounter (Signed)
Patient wants Advair instead of the symbicort.

## 2017-06-11 ENCOUNTER — Telehealth: Payer: Self-pay | Admitting: *Deleted

## 2017-06-11 MED ORDER — FLUTICASONE-SALMETEROL 100-50 MCG/DOSE IN AEPB: 1.0000 | INHALATION_SPRAY | Freq: Two times a day (BID) | RESPIRATORY_TRACT | 3 refills | Status: DC

## 2017-06-11 NOTE — Addendum Note (Signed)
Addended byEliezer Lofts E on: 06/11/2017 01:16 PM   Modules accepted: Orders

## 2017-06-11 NOTE — Telephone Encounter (Signed)
Angela Ali notified that Advair Rx has been sent to [facility] on [address].

## 2017-06-11 NOTE — Telephone Encounter (Signed)
Received fax from [facility] requesting PA for Advair.  PA completed on CoverMyMeds.  Sent for review.  Can take up to 72 hours for a response.

## 2017-06-11 NOTE — Telephone Encounter (Signed)
rx sent to [facility]

## 2017-06-22 NOTE — Telephone Encounter (Signed)
PA for Advair and appeal denied.  Symbicort is preferred medication. Rx was sent in for Symbicort but patient called and said she could not afford the Symbicort.  [facility] notified of denial via fax.

## 2017-06-23 ENCOUNTER — Encounter: Payer: Self-pay | Admitting: Family Medicine

## 2017-06-23 ENCOUNTER — Telehealth: Payer: Self-pay | Admitting: Family Medicine

## 2017-06-23 ENCOUNTER — Ambulatory Visit: Payer: Medicare PPO | Admitting: Family Medicine

## 2017-06-23 DIAGNOSIS — J0100 Acute maxillary sinusitis, unspecified: Secondary | ICD-10-CM

## 2017-06-23 DIAGNOSIS — J019 Acute sinusitis, unspecified: Secondary | ICD-10-CM | POA: Insufficient documentation

## 2017-06-23 MED ORDER — AMOXICILLIN 500 MG PO CAPS: 1000.0000 mg | ORAL_CAPSULE | Freq: Two times a day (BID) | ORAL | 0 refills | Status: DC

## 2017-06-23 NOTE — Progress Notes (Signed)
   Subjective:    Patient ID: Angela Ali, female    DOB: 04-Sep-1928, 84 y.o.   MRN: [id number]  HPI   84 year old female  with asthma moderate persistent recently dx with viral URI presents with new onset facial pain and worsening cough in last week.  Intermittent dizziness, no ear [pain, post nasal drip.  She feels tired and weak all over.  No fever.  No increase in wheeze or shortness of breath.  Blood pressure 120/80, pulse 67, temperature 99 F (37.2 C), temperature source Oral, height 5' 2.5" (1.588 m), weight 183 lb 4 oz (83.1 kg), SpO2 100 %. Social History /Family History/Past Medical History reviewed in detail and updated in EMR if needed.    Review of Systems  Constitutional: Negative for fatigue and fever.  HENT: Negative for ear pain.   Eyes: Negative for pain.  Respiratory: Negative for chest tightness and shortness of breath.   Cardiovascular: Negative for chest pain, palpitations and leg swelling.  Gastrointestinal: Negative for abdominal pain.  Genitourinary: Negative for dysuria.       Objective:   Physical Exam  Constitutional: Vital signs are normal. She appears well-developed and well-nourished. She is cooperative.  Non-toxic appearance. She does not appear ill. No distress.  HENT:  Head: Normocephalic.  Right Ear: Hearing, external ear and ear canal normal. Tympanic membrane is not erythematous, not retracted and not bulging. A middle ear effusion is present.  Left Ear: Hearing, external ear and ear canal normal. Tympanic membrane is not erythematous, not retracted and not bulging. A middle ear effusion is present.  Nose: Mucosal edema and rhinorrhea present. Right sinus exhibits maxillary sinus tenderness. Right sinus exhibits no frontal sinus tenderness. Left sinus exhibits maxillary sinus tenderness. Left sinus exhibits no frontal sinus tenderness.  Mouth/Throat: Uvula is midline, oropharynx is clear and moist and mucous membranes are normal.  Eyes:  Conjunctivae, EOM and lids are normal. Pupils are equal, round, and reactive to light. Lids are everted and swept, no foreign bodies found.  Neck: Trachea normal and normal range of motion. Neck supple. Carotid bruit is not present. No thyroid mass and no thyromegaly present.  Cardiovascular: Normal rate, regular rhythm, S1 normal, S2 normal, normal heart sounds, intact distal pulses and normal pulses. Exam reveals no gallop and no friction rub.  No murmur heard. Pulmonary/Chest: Effort normal and breath sounds normal. No tachypnea. No respiratory distress. She has no decreased breath sounds. She has no wheezes. She has no rhonchi. She has no rales.  Neurological: She is alert.  Skin: Skin is warm, dry and intact. No rash noted.  Psychiatric: Her speech is normal and behavior is normal. Judgment normal. Her mood appears not anxious. Cognition and memory are normal. She does not exhibit a depressed mood.          Assessment & Plan:

## 2017-06-23 NOTE — Telephone Encounter (Signed)
Copied from [place]. Topic: Quick Communication - Rx Refill/Question >> Jun 23, 2017  5:39 PM Oliver Pila B wrote: Medication: Breo   Has the patient contacted their pharmacy? Yes.     (Agent: If no, request that the patient contact the pharmacy for the refill.)   Preferred Pharmacy (with phone number or street name): [facility]   Agent: Please be advised that RX refills may take up to 3 business days. We ask that you follow-up with your pharmacy.

## 2017-06-23 NOTE — Assessment & Plan Note (Signed)
Given > 7-10 days of illness with new facial pain severe.. Treat for bacterial sinus inflection.

## 2017-06-23 NOTE — Patient Instructions (Addendum)
Complete course of amoxicillin x 10 days.  Nasal saline, flonase and continue asthma and allergy meds.  Call if shortness of breath start, if severe go to ER.

## 2017-06-24 MED ORDER — FLUTICASONE FUROATE-VILANTEROL 100-25 MCG/INH IN AEPB: 1.0000 | INHALATION_SPRAY | Freq: Every day | RESPIRATORY_TRACT | 3 refills | Status: DC

## 2017-06-24 NOTE — Telephone Encounter (Signed)
Left message for Angela Ali that Rx for Breo Inhaler has been sent into [facility].

## 2017-06-24 NOTE — Telephone Encounter (Signed)
Called pt regarding breo prescription; ht e pt says that she has not been prescribed this; she also states that she can get this medication for $6 thru [employer] but it is $300; (she normally uses [facility]) but wants this to go to [facility]; pt can be contacted at [phone]; will route pt request to [place]; pt last seen in office 06/23/17 by Dr Diona Browner.

## 2017-06-24 NOTE — Telephone Encounter (Signed)
Will send in breo inhaler to [facility].

## 2017-06-28 ENCOUNTER — Other Ambulatory Visit: Payer: Self-pay | Admitting: *Deleted

## 2017-06-28 ENCOUNTER — Other Ambulatory Visit: Payer: Self-pay | Admitting: Family Medicine

## 2017-06-28 MED ORDER — ACCU-CHEK FASTCLIX LANCETS MISC: 3 refills | Status: DC

## 2017-06-28 MED ORDER — GLUCOSE BLOOD VI STRP: ORAL_STRIP | 12 refills | Status: DC

## 2017-06-28 MED ORDER — ACCU-CHEK GUIDE W/DEVICE KIT: 1.0000 | PACK | Freq: Two times a day (BID) | 0 refills | Status: AC

## 2017-06-28 NOTE — Telephone Encounter (Signed)
Patient is compliant with coumadin management, will refill X 6 months.   

## 2017-06-29 ENCOUNTER — Ambulatory Visit: Payer: Medicare PPO

## 2017-06-29 DIAGNOSIS — Z7901 Long term (current) use of anticoagulants: Secondary | ICD-10-CM

## 2017-06-29 LAB — POCT INR: INR: 1.8

## 2017-06-29 NOTE — Patient Instructions (Signed)
INR today 1.8  *patient has recently been sick and is currently on amoxicillin for past week.  She has 1 more week of therapy before completion.    Take 1.5 pills today (7.5mg ) for a boost and then resume prior dose,  taking 1/2 tablet daily except 1 tablet Tues and Saturdays.  Re-check in 2 weeks.  Patient is feeling better and is improving.  She is aware of risks associated with a subtherapeutic level and will go to ER if any concerning symptoms.

## 2017-07-01 ENCOUNTER — Ambulatory Visit: Payer: Medicare PPO

## 2017-07-06 NOTE — Progress Notes (Signed)
[place] patient, will send to PCP.

## 2017-07-15 ENCOUNTER — Ambulatory Visit: Payer: Medicare PPO | Admitting: General Practice

## 2017-07-15 ENCOUNTER — Telehealth: Payer: Self-pay | Admitting: *Deleted

## 2017-07-15 DIAGNOSIS — Z7901 Long term (current) use of anticoagulants: Secondary | ICD-10-CM | POA: Diagnosis not present

## 2017-07-15 LAB — POCT INR: INR: 2.4

## 2017-07-15 NOTE — Patient Instructions (Addendum)
Pre visit review using our clinic review tool, if applicable. No additional management support is needed unless otherwise documented below in the visit note.  Continue to take 1/2 tablet daily except 1 tablet Tues and Saturdays.  Re-check in 4 weeks.   

## 2017-07-15 NOTE — Telephone Encounter (Signed)
-----   Message from Jinny Sanders, MD sent at 07/15/2017 12:36 PM EDT ----- Regarding: FW: Medication  I believe we have already looked into this. Let pt know.. We do not have samples. I know of no cheaper options.  Eliezer Lofts, MD [facility] Vera at [facility]  ----- Message ----- From: Warden Fillers, RN Sent: 07/15/2017  11:15 AM To: Jinny Sanders, MD Subject: Medication                                     Hello!  Patient is wanting an inhaler but the [employer] Patter is costing her $300.00.  She wants to know if there is anything less expensive or if you can give her some samples.  Please advise.  Thanks,  Villa Herb, RN  Please route your response to your CMA.   Thank you!

## 2017-07-15 NOTE — Telephone Encounter (Signed)
Spoke with Angela Ali.  She has called and told us back in February that she could get the [employer] through [place] for $6.   She states someone at [facility] had told her that but that was not true.  It was going to cost her $300.  I advised we do not have any samples and we have tried using coupons and that didn't work as far as making the inhaler affordable.  I advised all prescription strength inhalers are expensive.  Dr. Diona Browner did not recommend using Primatene Mist OTC inhaler.  I advised patient to continue using her Singulair and Nasal Spray.  I told her to call us back if she continues having issues.  Patient states understanding.

## 2017-08-11 ENCOUNTER — Ambulatory Visit: Payer: Self-pay | Admitting: *Deleted

## 2017-08-11 NOTE — Telephone Encounter (Signed)
Spoke with Rena regarding pt request; she requests that this be routed to office for provider review.

## 2017-08-11 NOTE — Telephone Encounter (Signed)
Katrese RN at [facility] called office; pt stays tired with no energy all the time; pt wanted to know since she is on coumadin if she could take Vitamin B 12; do not see Vit B 12 labs and did not know if pt could have B 12 if Dr Diona Browner would prefer oral supplement or injections.pt request cb after Dr Arley Phenix review.

## 2017-08-12 ENCOUNTER — Ambulatory Visit: Payer: Medicare PPO | Admitting: General Practice

## 2017-08-12 DIAGNOSIS — Z7901 Long term (current) use of anticoagulants: Secondary | ICD-10-CM

## 2017-08-12 LAB — POCT INR: INR: 2

## 2017-08-12 NOTE — Patient Instructions (Addendum)
Pre visit review using our clinic review tool, if applicable. No additional management support is needed unless otherwise documented below in the visit note.  Continue to take 1/2 tablet daily except 1 tablet Tues and Saturdays.  Re-check in 4 weeks.   

## 2017-08-12 NOTE — Telephone Encounter (Signed)
Left message for Ms. Vanderhoff that it is okay for her to start Vitamin B12 1000 mcg daily OTC per Dr. Diona Browner.

## 2017-08-12 NOTE — Telephone Encounter (Signed)
OKay to start b12 1000 mcg daily orally OTC.

## 2017-08-17 ENCOUNTER — Ambulatory Visit: Payer: Medicare PPO | Admitting: Family Medicine

## 2017-08-17 DIAGNOSIS — Z0289 Encounter for other administrative examinations: Secondary | ICD-10-CM

## 2017-09-08 ENCOUNTER — Other Ambulatory Visit (INDEPENDENT_AMBULATORY_CARE_PROVIDER_SITE_OTHER): Payer: Medicare PPO

## 2017-09-08 ENCOUNTER — Ambulatory Visit: Payer: Medicare PPO | Admitting: Family Medicine

## 2017-09-08 ENCOUNTER — Encounter: Payer: Self-pay | Admitting: Family Medicine

## 2017-09-08 VITALS — BP 146/99 | HR 88 | Temp 98.5°F | Ht 62.5 in | Wt 187.5 lb

## 2017-09-08 DIAGNOSIS — Z7901 Long term (current) use of anticoagulants: Secondary | ICD-10-CM | POA: Diagnosis not present

## 2017-09-08 DIAGNOSIS — M489 Spondylopathy, unspecified: Secondary | ICD-10-CM | POA: Diagnosis not present

## 2017-09-08 DIAGNOSIS — M170 Bilateral primary osteoarthritis of knee: Secondary | ICD-10-CM

## 2017-09-08 DIAGNOSIS — M545 Low back pain: Secondary | ICD-10-CM | POA: Diagnosis not present

## 2017-09-08 DIAGNOSIS — M159 Polyosteoarthritis, unspecified: Secondary | ICD-10-CM

## 2017-09-08 DIAGNOSIS — G8929 Other chronic pain: Secondary | ICD-10-CM

## 2017-09-08 DIAGNOSIS — M150 Primary generalized (osteo)arthritis: Secondary | ICD-10-CM

## 2017-09-08 DIAGNOSIS — I4891 Unspecified atrial fibrillation: Secondary | ICD-10-CM

## 2017-09-08 DIAGNOSIS — M47817 Spondylosis without myelopathy or radiculopathy, lumbosacral region: Secondary | ICD-10-CM

## 2017-09-08 LAB — POCT INR: INR: 3

## 2017-09-08 MED ORDER — DEXAMETHASONE SODIUM PHOSPHATE 100 MG/10ML IJ SOLN
10.0000 mg | Freq: Once | INTRAMUSCULAR | Status: AC
  Administered 2017-09-08: 10 mg via INTRAMUSCULAR

## 2017-09-08 MED ORDER — TIZANIDINE HCL 2 MG PO TABS: 2.0000 mg | ORAL_TABLET | Freq: Four times a day (QID) | ORAL | 3 refills | Status: DC | PRN

## 2017-09-08 NOTE — Patient Instructions (Signed)
Topical Capzaicin Cream, as needed (wear glove to put on) - DO NOT GET IT ON YOUR FACE

## 2017-09-08 NOTE — Progress Notes (Signed)
Dr. Karleen Hampshire T. Javion Holmer, MD, CAQ [facility] [address] [place], [address] Phone: [phone] Fax: [phone]  09/08/2017  Patient: Angela Ali, MRN: [id number], DOB: 26-Jun-1928, 84 y.o.  Primary Physician:  Excell Seltzer, MD   Chief Complaint  Patient presents with  . Back Pain    Radiates down leg  . Knee Pain    Bilateral  . Joint Swelling    Ankle   Subjective:   Angela Ali is a 84 y.o. very pleasant female patient who presents with the following:  88 years. Knee pain, back pain, as well as some ankle swelling more on the right.  She has multiple medical comorbidities including atrial fibrillation, congestive heart failure, type 2 diabetes, chronic coumadin use, h/o longstanding back pain,   None of these issues are really acute, and the patient has been having them for years.  She does have some ongoing persistent back pain and has had multiple studies of her back, and most recent MRI shows very significant spondyloarthropathy along with a relatively mild disc bulge at L3-4.  She does take some Tylenol as well as some tramadol occasionally, and she occasionally will take a Flexeril, but this does make her sedated somewhat.  She also has bilateral knee pain, and she has been having this for many years.  Her most recent knee x-rays appear to be 6 or 7 years ago, but even at that time she was having some moderate to severe tricompartmental arthritis.  She also has a little pain at the scar that she has on the right lateral aspect of her upper lower extremity after hematoma.  Flexeril 5 mg makes her drowsy.  Icy hot and other linaments.   Past Medical History, Surgical History, Social History, Family History, Problem List, Medications, and Allergies have been reviewed and updated if relevant.  Patient Active Problem List   Diagnosis Date Noted  . Acute sinus infection 06/23/2017  . Post-nasal drip 06/08/2017  . Other  fatigue 04/09/2017  . Long term (current) use of anticoagulants 04/08/2017  . First degree burn of right forearm 02/19/2017  . Bulging lumbar disc 11/20/2016  . Mass on back 02/18/2016  . Obesity with serious comorbidity 11/25/2015  . Sacroiliac joint pain 09/26/2015  . Lipoma of back 06/06/2015  . Osteoarthritis of both knees 01/18/2015  . Counseling regarding end of life decision making 09/07/2014  . Encounter for therapeutic drug monitoring 06/05/2013  . Chronic diastolic CHF (congestive heart failure) (HCC) 03/21/2013  . Chronic cough 02/03/2013  . Recurrent boils 06/22/2012  . Chronic constipation 12/01/2011  . Splenic mass 08/21/2011  . Hx of Hematoma of leg, with chronic leg weakness and numbness 09/15/2010  . Moderate persistent asthma in adult without complication 07/03/2009  . FATIGUE, CHRONIC 03/19/2009  . Vitamin D deficiency 02/26/2009  . LOW BACK PAIN, CHRONIC 11/19/2008  . Secondary cardiomyopathy (HCC) 10/02/2008  . Allergic rhinitis due to pollen 03/23/2008  . BENIGN NEOPLASM OTH&UNSPEC SITE DIGESTIVE SYSTEM 02/01/2007  . STRICTURE, ESOPHAGEAL 02/01/2007  . Diabetes mellitus type 2 with complications (HCC) 12/24/2006  . Osteoporosis 12/21/2006  . Hyperlipemia 12/16/2006  . Essential hypertension, benign 12/16/2006  . Atrial fibrillation (HCC) 12/16/2006  . Congestive heart failure (HCC) 12/16/2006  . GERD 12/16/2006  . HIATAL HERNIA 12/16/2006  . OSTEOARTHRITIS 12/16/2006  . KNEE PAIN, RIGHT, CHRONIC 12/16/2006  . HERNIATED LUMBAR DISC 12/16/2006  . PERIPHERAL EDEMA 12/16/2006  . COLONIC POLYPS, HYPERPLASTIC 08/26/2000  .  DIVERTICULOSIS, COLON 08/26/2000    Past Medical History:  Diagnosis Date  . Allergic rhinitis, cause unspecified   . Atrial fibrillation (HCC)   . Atrial flutter (HCC)   . Bacterial pneumonia, unspecified   . Colonic polyp   . Congestive heart failure, unspecified   . Diabetes mellitus without complication (HCC)     borderline-checks sugars but no meds  . Diaphragmatic hernia without mention of obstruction or gangrene   . Displacement of lumbar intervertebral disc without myelopathy   . Diverticulosis of colon (without mention of hemorrhage)   . Edema   . Esophageal reflux   . Long term (current) use of anticoagulants   . Osteoarthrosis, unspecified whether generalized or localized, unspecified site   . Other abnormal glucose   . Other and unspecified hyperlipidemia   . Other malaise and fatigue   . Overweight(278.02)   . Prediabetes   . Secondary cardiomyopathy, unspecified   . Splenic mass 08/21/2011  . Stricture and stenosis of esophagus   . Thigh abscess   . Unspecified essential hypertension     Past Surgical History:  Procedure Laterality Date  . ABDOMINAL HYSTERECTOMY  1971   partial  . ANKLE SURGERY Left 2001  . CHOLECYSTECTOMY  2004  . COLONOSCOPY    . ESOPHAGEAL DILATION  01/2007  . POLYPECTOMY    . spleen removed     2005 or 2006 in [place]    Social History   Socioeconomic History  . Marital status: Widowed    Spouse name: Not on file  . Number of children: 8  . Years of education: Not on file  . Highest education level: Not on file  Occupational History  . 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  Used to live in [place], [place], now in [place] with her family  Social Needs  . Financial resource strain: Not on file  . Food insecurity:    Worry: Not on file    Inability: Not on file  . Transportation needs:    Medical: Not on file    Non-medical: Not on file  Tobacco Use  . Smoking status: Never Smoker  . Smokeless tobacco: Never Used  Substance and Sexual Activity  . Alcohol use: No    Alcohol/week: 0.0 oz  . Drug use: No  . Sexual activity: Never  Lifestyle  . Physical activity:    Days per week: Not on file    Minutes per session: Not on file  . Stress: Not on file  Relationships  . Social connections:      Talks on phone: Not on file    Gets together: Not on file    Attends religious service: Not on file    Active member of club or organization: Not on file    Attends meetings of clubs or organizations: Not on file    Relationship status: Not on file  . Intimate partner violence:    Fear of current or ex partner: Not on file    Emotionally abused: Not on file    Physically abused: Not on file    Forced sexual activity: Not on file  Other Topics Concern  . Not on file  Social History Narrative   Lost spouse to cancer in 07/2006   5 children   Does not get regular exercise    Family History  Problem Relation Age of Onset  . Emphysema Father 56  . Coronary artery disease Mother 1  .  Hypertension Mother 49  . Ovarian cancer Daughter   . Bone cancer Son        bone marrow ca  . Hyperlipidemia Sister        # 1  . Hyperlipidemia Sister        # 2  . Diabetes Daughter        # 1  . Hypertension Daughter   . Diabetes Daughter        # 2   . Hypertension Daughter   . Colon cancer Neg Hx     Allergies  Allergen Reactions  . Morphine And Related   . Percocet [Oxycodone-Acetaminophen] Nausea Only       . Tramadol Nausea Only    Medication list reviewed and updated in full in [facility].  GEN: No fevers, chills. Nontoxic. Primarily MSK c/o today. MSK: Detailed in the HPI GI: tolerating PO intake without difficulty Neuro: No numbness, parasthesias, or tingling associated. Otherwise the pertinent positives of the ROS are noted above.   Objective:   BP (!) 146/99   Pulse 88   Temp 98.5 F (36.9 C) (Oral)   Ht 5' 2.5" (1.588 m)   Wt 187 lb 8 oz (85 kg)   BMI 33.75 kg/m    GEN: Well-developed,well-nourished,in no acute distress; alert,appropriate and cooperative throughout examination HEENT: Normocephalic and atraumatic without obvious abnormalities. Ears, externally no deformities PULM: Breathing comfortably in no respiratory distress EXT: No clubbing,  cyanosis, or edema PSYCH: Normally interactive. Cooperative during the interview. Pleasant. Friendly and conversant. Not anxious or depressed appearing. Normal, full affect.  Range of motion at  the waist: Flexion, extension, lateral bending and rotation:  Flexion to 45, limited ext, rotational and lateral movements are better compared to the others  No echymosis or edema Rises to examination table with mild difficulty Gait: minimally antalgic  Inspection/Deformity: N Paraspinus Tenderness: very tight, R > L,   B Ankle Dorsiflexion (L5,4): 5/5 B Great Toe Dorsiflexion (L5,4): 5/5  SENSORY B Medial Foot (L4): WNL B Dorsum (L5): WNL B Lateral (S1): WNL Light Touch: WNL Pinprick: WNL  REFLEXES Knee (L4): 2+ Ankle (S1): 2+  B SLR, seated: neg B SLR, supine: neg B Greater Troch: NT B Log Roll: neg B Sciatic Notch: mild tenderness  Bilateral knees lack approximately 2 degrees of extension and are able to flex to 115 degrees.  Both have medial and lateral joint line tenderness.  There is minimal movement at the patella.  Stable MCL and LCL.  ACL and PCL also appear stable.  Patient has significant pain with flexion pinch maneuver as well as McMurray's.  Radiology: CLINICAL DATA:  Left buttock pain radiating to the left knee.   EXAM:  MRI LUMBAR SPINE WITHOUT CONTRAST   TECHNIQUE:  Multiplanar, multisequence MR imaging of the lumbar spine was  performed. No intravenous contrast was administered.   COMPARISON:  None.   FINDINGS:  The vertebral bodies of the lumbar spine are normal in size. The  vertebral bodies of the lumbar spine are normal in alignment. There  is normal bone marrow signal demonstrated throughout the vertebra.  There is degenerative disc disease with disc height loss at L1-2,  L4-5 and L5-S1.   The spinal cord is normal in signal and contour. The cord terminates  normally at L1 . The nerve roots of the cauda equina and the filum  terminale are  normal.   The visualized portions of the SI joints are unremarkable.   There are  bilateral T2 hyperintense renal lesions most consistent  with cysts.   T12-L1: No significant disc bulge. No evidence of neural foraminal  stenosis. No central canal stenosis.   L1-L2: Mild broad-based disc bulge. No evidence of neural foraminal  stenosis. Mild central canal narrowing resulting from the  broad-based disc bulge.   L2-L3: Mild broad-based disc bulge. Mild bilateral facet  arthropathy. No evidence of neural foraminal stenosis. No central  canal stenosis.   L3-L4: Mild broad-based disc bulge with a left foraminal disc  protrusion with superior migration of disc material with mass effect  on the left L3 nerve root. No right foraminal stenosis. No central  canal stenosis.   L4-L5: Mild broad-based disc bulge. Mild bilateral facet  arthropathy. Bile bilateral foraminal stenosis. No central canal  stenosis.   L5-S1: Mild broad-based disc bulge eccentric towards the left.  Moderate bilateral facet arthropathy. Moderate left and mild right  foraminal narrowing. No central canal stenosis.   IMPRESSION:  1. At L3-4 there is a mild broad-based disc bulge with a left  foraminal disc protrusion with superior migration of disc material  with mass effect on the left L3 nerve root.  2. Lumbar spine spondylosis as described above.   Assessment and Plan:   Lumbar and sacral spondyloarthritis  Long term (current) use of anticoagulants  Atrial fibrillation, unspecified type (HCC)  Chronic low back pain, unspecified back pain laterality, with sciatica presence unspecified - Plan: dexamethasone (DECADRON) injection 10 mg  Primary osteoarthritis of both knees  Primary osteoarthritis involving multiple joints  This is a challenging situation in an approaching 84 year old patient who has multiple areas of significant arthritis throughout her body and multiple areas of significant pain  with medical co-morbidities.    Chronically on Coumadin.  I did give her 10 mg of Decadron in the office today to see if we can give her some relief for the short-term.  Discontinue Flexeril given fall risk and somnolence.  Trial of lower dose Zanaflex to see if this will help some with the patient's back pain and other muscular complaints. I encouraged massage if possible.   Patient Instructions  Topical Capzaicin Cream, as needed (wear glove to put on) - DO NOT GET IT ON YOUR FACE   Follow-up: No follow-ups on file.  Meds ordered this encounter  Medications  . tiZANidine (ZANAFLEX) 2 MG tablet    Sig: Take 1 tablet (2 mg total) by mouth every 6 (six) hours as needed for muscle spasms.    Dispense:  30 tablet    Refill:  3  . dexamethasone (DECADRON) injection 10 mg    Signed,  Dantrell Schertzer T. Maleiyah Releford, MD   Allergies as of 09/08/2017      Reactions   Morphine And Related    Percocet [oxycodone-acetaminophen] Nausea Only      Tramadol Nausea Only      Medication List        Accurate as of 09/08/17 11:59 PM. Always use your most recent med list.          ACCU-CHEK FASTCLIX LANCETS Misc Use to check blood sugar 2 times a day.  Dx: E11.8   ACCU-CHEK GUIDE w/Device Kit 1 each by Does not apply route 2 (two) times daily. Dx: E11.8   atorvastatin 80 MG tablet Commonly known as:  LIPITOR Take 80 mg by mouth daily.   Azelastine HCl 137 MCG/SPRAY Soln Place 1-2 sprays into the nose 2 (two) times daily.   benzonatate 100 MG capsule Commonly  known as:  TESSALON TAKE TWO CAPSULES BY MOUTH TWICE DAILY AS NEEDED FOR COUGH   Blood Pressure Kit Use daily as needed to check blood pressure.   cloNIDine 0.1 MG tablet Commonly known as:  CATAPRES Take 0.5 tablets (0.05 mg total) by mouth 2 (two) times daily.   digoxin 0.125 MG tablet Commonly known as:  LANOXIN Take 1 tablet (125 mcg total) by mouth daily.   fexofenadine 180 MG tablet Commonly known as:  ALLEGRA Take 1  tablet (180 mg total) by mouth daily.   fluticasone 50 MCG/ACT nasal spray Commonly known as:  FLONASE Place 2 sprays into both nostrils daily.   fluticasone furoate-vilanterol 100-25 MCG/INH Aepb Commonly known as:  BREO ELLIPTA Inhale 1 puff into the lungs daily.   furosemide 20 MG tablet Commonly known as:  LASIX TAKE 1 TABLET EVERY DAY   glucose blood test strip Commonly known as:  ACCU-CHEK GUIDE Use to check blood sugar 2 times a day.  Dx: E11.8   hydrALAZINE 50 MG tablet Commonly known as:  APRESOLINE Take 1 tablet (50 mg total) by mouth 3 (three) times daily.   hydrochlorothiazide 25 MG tablet Commonly known as:  HYDRODIURIL Take 1 tablet (25 mg total) by mouth daily.   latanoprost 0.005 % ophthalmic solution Commonly known as:  XALATAN   losartan 100 MG tablet Commonly known as:  COZAAR Take 1 tablet (100 mg total) by mouth daily.   metoprolol succinate 50 MG 24 hr tablet Commonly known as:  TOPROL-XL TAKE 1 TABLET (50 MG TOTAL) BY MOUTH DAILY. TAKE WITH OR IMMEDIATELY FOLLOWING A MEAL.   montelukast 10 MG tablet Commonly known as:  SINGULAIR Take 1 tablet (10 mg total) by mouth at bedtime.   omeprazole 20 MG capsule Commonly known as:  PRILOSEC TAKE 1 CAPSULE TWICE DAILY BEFORE A MEAL   potassium chloride SA 20 MEQ tablet Commonly known as:  K-DUR,KLOR-CON Take 1 tablet (20 mEq total) by mouth daily.   timolol 0.5 % ophthalmic solution Commonly known as:  TIMOPTIC   tiZANidine 2 MG tablet Commonly known as:  ZANAFLEX Take 1 tablet (2 mg total) by mouth every 6 (six) hours as needed for muscle spasms.   traMADol 50 MG tablet Commonly known as:  ULTRAM Take 0.5 tablets (25 mg total) by mouth every 8 (eight) hours as needed.   TYLENOL ARTHRITIS PAIN 650 MG CR tablet Generic drug:  acetaminophen Take 650 mg by mouth daily.   VITAMIN C PO Take 1 tablet by mouth daily.   VITAMIN D-3 PO Take 1 tablet by mouth daily.   warfarin 5 MG  tablet Commonly known as:  COUMADIN Take as directed by the anticoagulation clinic. If you are unsure how to take this medication, talk to your nurse or doctor. Original instructions:  TAKE AS DIRECTED  BY  ANTI  COAGULATION  CLINIC

## 2017-09-09 ENCOUNTER — Ambulatory Visit: Payer: Medicare PPO

## 2017-10-07 ENCOUNTER — Ambulatory Visit (INDEPENDENT_AMBULATORY_CARE_PROVIDER_SITE_OTHER): Payer: Medicare PPO | Admitting: General Practice

## 2017-10-07 DIAGNOSIS — Z7901 Long term (current) use of anticoagulants: Secondary | ICD-10-CM | POA: Diagnosis not present

## 2017-10-07 LAB — POCT INR: INR: 3.2 — AB (ref 2.0–3.0)

## 2017-10-07 NOTE — Patient Instructions (Addendum)
Pre visit review using our clinic review tool, if applicable. No additional management support is needed unless otherwise documented below in the visit note.  Hold coumadin today (6/13) and then continue to take 1/2 tablet daily except 1 tablet Tues and Saturdays.  Re-check in 4 weeks.

## 2017-10-17 ENCOUNTER — Other Ambulatory Visit: Payer: Self-pay | Admitting: Family Medicine

## 2017-10-18 NOTE — Telephone Encounter (Signed)
Last office visit 09/08/2017 with Dr. Lorelei Pont.  Last refilled 08/24/2017 for #120 with no refills.  Ok to refill?

## 2017-10-22 ENCOUNTER — Telehealth: Payer: Self-pay | Admitting: Family Medicine

## 2017-10-22 MED ORDER — FLUTICASONE-SALMETEROL 250-50 MCG/DOSE IN AEPB
1.0000 | INHALATION_SPRAY | Freq: Two times a day (BID) | RESPIRATORY_TRACT | 11 refills | Status: DC
Start: 2017-10-22 — End: 2018-04-29

## 2017-10-22 NOTE — Telephone Encounter (Signed)
Copied from [place] [id number]. Topic: Quick Communication - See Telephone Encounter >> Oct 22, 2017  4:37 PM Percell Belt A wrote: CRM for notification. See Telephone encounter for: 10/22/17.  Pt called and stated that [employer] told her that if the dr sent in Advair inhaler to a [facility] she could get it for $50.00   [place]  [phone]

## 2017-10-22 NOTE — Telephone Encounter (Signed)
Okay to make change as requested.

## 2017-10-22 NOTE — Telephone Encounter (Signed)
Copied from [place] [id number]. Topic: Quick Communication - See Telephone Encounter >> Oct 22, 2017  4:09 PM Rutherford Nail, NT wrote: CRM for notification. See Telephone encounter for: 10/22/17. Ieda with [facility] calling and is wanting to know if the patient's potassium chloride SA (K-DUR,KLOR-CON) 20 MEQ tablet could be changed to 10 MEQ and take 2 tabs once a day? States that the patient is having a hard time swallowing the pill due to the size. Please advise.  CB#: [phone] ext [phone]

## 2017-10-23 MED ORDER — POTASSIUM CHLORIDE ER 10 MEQ PO TBCR: 20.0000 meq | EXTENDED_RELEASE_TABLET | Freq: Every day | ORAL | 1 refills | Status: DC

## 2017-10-23 NOTE — Telephone Encounter (Signed)
Rx sent to [employer].  

## 2017-10-26 ENCOUNTER — Telehealth: Payer: Self-pay | Admitting: *Deleted

## 2017-10-26 NOTE — Telephone Encounter (Addendum)
Received fax from [facility] requesting PA for Advair 250/50.  PA completed on CoverMyMeds.  Awaiting decision.  Can take up to 72 hours to get a response.  Formulary inhalers: Symbicort, Breo & AirDuo.

## 2017-10-27 NOTE — Telephone Encounter (Addendum)
PA denied.  Non-formulary medication.  See list of preferred.  [facility] notified of denial via fax.

## 2017-11-04 ENCOUNTER — Ambulatory Visit: Payer: Medicare PPO | Admitting: General Practice

## 2017-11-04 DIAGNOSIS — Z7901 Long term (current) use of anticoagulants: Secondary | ICD-10-CM

## 2017-11-04 LAB — POCT INR: INR: 2.9 (ref 2.0–3.0)

## 2017-11-04 NOTE — Patient Instructions (Addendum)
Pre visit review using our clinic review tool, if applicable. No additional management support is needed unless otherwise documented below in the visit note.  Continue to take 1/2 tablet daily except 1 tablet Tues and Saturdays.  Re-check in 4 weeks.

## 2017-11-06 NOTE — Progress Notes (Deleted)
Cardiology Office Note  Date:  11/06/2017   ID:  Angela Ali, DOB Apr 06, 1929, MRN [id number]  PCP:  Jinny Sanders, MD   No chief complaint on file.   HPI:  [name] is a very pleasant 84 year old woman with a history of  chronic atrial fibrillation on warfarin,  hypertension,  chronic lower extremity edema,  shortness of breath,  who presents for routine followup of her atrial fibrillation  And fatigue  On her last clinic visit reported having fatigue Noted to have bradycardia, we decrease the metoprolol from 100 down to 50 mg daily On today's visit still with fatigue Reports sleeping adequate amount No regular exercise program Checks blood pressure at home, reports it is typically well controlled Weight is stable  Back pain, chronic issue Knee pain Takes Lasix as needed  Active, does not like to use her cane, no recent falls No regular exercise, goes to bed late Lab work reviewed in detail with her AIC 6.2 Total chol 184, LDL 112  EKG personally reviewed by myself on todays visit Atrial fibrillation with ventricular rate 75 bpm nonspecific ST abnormality, left axis deviation  Other past medical history reviewed Previous  fall with hematoma of the right lower extremity,  required care from the wound clinic for a large ulcerative wound on the right lateral aspect of her leg. This has healed well. History of medication confusion, some noncompliance. Prior trauma to her right lower extremity, surgery to her left ankle  Echo 08/2009 EF 45 to 50%   Calcium channel blocker/diltiazem was held in the past secondary to edema.    PMH:   has a past medical history of Allergic rhinitis, cause unspecified, Atrial fibrillation ([place]), Atrial flutter ([place]), Bacterial pneumonia, unspecified, Colonic polyp, Congestive heart failure, unspecified, Diabetes mellitus without complication ([place]), Diaphragmatic hernia without mention of obstruction or gangrene, Displacement of lumbar  intervertebral disc without myelopathy, Diverticulosis of colon (without mention of hemorrhage), Edema, Esophageal reflux, Long term (current) use of anticoagulants, Osteoarthrosis, unspecified whether generalized or localized, unspecified site, Other abnormal glucose, Other and unspecified hyperlipidemia, Other malaise and fatigue, Overweight(278.02), Prediabetes, Secondary cardiomyopathy, unspecified, Splenic mass (08/21/2011), Stricture and stenosis of esophagus, Thigh abscess, and Unspecified essential hypertension.  PSH:    Past Surgical History:  Procedure Laterality Date  . ABDOMINAL HYSTERECTOMY  1971   partial  . ANKLE SURGERY Left 2001  . CHOLECYSTECTOMY  2004  . COLONOSCOPY    . ESOPHAGEAL DILATION  01/2007  . POLYPECTOMY    . spleen removed     2005 or 2006 in [place]    Current Outpatient Medications  Medication Sig Dispense Refill  . ACCU-CHEK FASTCLIX LANCETS MISC Use to check blood sugar 2 times a day.  Dx: E11.8 204 each 3  . acetaminophen (TYLENOL ARTHRITIS PAIN) 650 MG CR tablet Take 650 mg by mouth daily.    . Ascorbic Acid (VITAMIN C PO) Take 1 tablet by mouth daily.    Mar[place] Kitchen atorvastatin (LIPITOR) 80 MG tablet Take 80 mg by mouth daily.     . Azelastine HCl 137 MCG/SPRAY SOLN Place 1-2 sprays into the nose 2 (two) times daily. 30 mL 11  . benzonatate (TESSALON) 100 MG capsule TAKE 2 CAPSULES BY MOUTH TWICE DAILY AS NEEDED FOR COUGH 120 capsule 0  . Blood Glucose Monitoring Suppl (ACCU-CHEK GUIDE) w/Device KIT 1 each by Does not apply route 2 (two) times daily. Dx: E11.8 1 kit 0  . Blood Pressure KIT Use daily as needed to  check blood pressure. 1 each 0  . Cholecalciferol (VITAMIN D-3 PO) Take 1 tablet by mouth daily.    . cloNIDine (CATAPRES) 0.1 MG tablet Take 0.5 tablets (0.05 mg total) by mouth 2 (two) times daily. 90 tablet 3  . digoxin (LANOXIN) 0.125 MG tablet Take 1 tablet (125 mcg total) by mouth daily. 90 tablet 3  . fexofenadine (ALLEGRA) 180 MG tablet Take 1  tablet (180 mg total) by mouth daily. 90 tablet 3  . fluticasone (FLONASE) 50 MCG/ACT nasal spray Place 2 sprays into both nostrils daily. 45 g 3  . Fluticasone-Salmeterol (ADVAIR) 250-50 MCG/DOSE AEPB Inhale 1 puff into the lungs 2 (two) times daily. 1 each 11  . furosemide (LASIX) 20 MG tablet TAKE 1 TABLET EVERY DAY 90 tablet 1  . glucose blood (ACCU-CHEK GUIDE) test strip Use to check blood sugar 2 times a day.  Dx: E11.8 100 each 12  . hydrALAZINE (APRESOLINE) 50 MG tablet Take 1 tablet (50 mg total) by mouth 3 (three) times daily. 270 tablet 3  . hydrochlorothiazide (HYDRODIURIL) 25 MG tablet Take 1 tablet (25 mg total) by mouth daily. 90 tablet 3  . latanoprost (XALATAN) 0.005 % ophthalmic solution     . losartan (COZAAR) 100 MG tablet Take 1 tablet (100 mg total) by mouth daily. 90 tablet 3  . metoprolol succinate (TOPROL-XL) 50 MG 24 hr tablet TAKE 1 TABLET (50 MG TOTAL) BY MOUTH DAILY. TAKE WITH OR IMMEDIATELY FOLLOWING A MEAL. 90 tablet 1  . montelukast (SINGULAIR) 10 MG tablet Take 1 tablet (10 mg total) by mouth at bedtime. 90 tablet 3  . omeprazole (PRILOSEC) 20 MG capsule TAKE 1 CAPSULE TWICE DAILY BEFORE A MEAL 180 capsule 1  . potassium chloride (K-DUR) 10 MEQ tablet Take 2 tablets (20 mEq total) by mouth daily. 180 tablet 1  . potassium chloride SA (K-DUR,KLOR-CON) 20 MEQ tablet Take 1 tablet (20 mEq total) by mouth daily. 90 tablet 3  . timolol (TIMOPTIC) 0.5 % ophthalmic solution     . tiZANidine (ZANAFLEX) 2 MG tablet Take 1 tablet (2 mg total) by mouth every 6 (six) hours as needed for muscle spasms. 30 tablet 3  . traMADol (ULTRAM) 50 MG tablet Take 0.5 tablets (25 mg total) by mouth every 8 (eight) hours as needed. 60 tablet 0  . warfarin (COUMADIN) 5 MG tablet TAKE AS DIRECTED  BY  ANTI  COAGULATION  CLINIC 105 tablet 0   No current facility-administered medications for this visit.      Allergies:   Morphine and related; Percocet [oxycodone-acetaminophen]; and Tramadol    Social History:  The patient  reports that she has never smoked. She has never used smokeless tobacco. She reports that she does not drink alcohol or use drugs.   Family History:   family history includes Bone cancer in her son; Coronary artery disease (age of onset: 67) in her mother; Diabetes in her daughter and daughter; Emphysema (age of onset: 21) in her father; Hyperlipidemia in her sister and sister; Hypertension in her daughter and daughter; Hypertension (age of onset: 1) in her mother; Ovarian cancer in her daughter.    Review of Systems: Review of Systems  Constitutional: Positive for malaise/fatigue.  Respiratory: Negative.   Cardiovascular: Negative.   Gastrointestinal: Negative.   Musculoskeletal: Positive for joint pain and myalgias.       Muscle cramping  Neurological: Negative.   Psychiatric/Behavioral: Negative.   All other systems reviewed and are negative.    PHYSICAL  EXAM: VS:  There were no vitals taken for this visit. , BMI There is no height or weight on file to calculate BMI. GEN: Well nourished, well developed, in no acute distress  HEENT: normal  Neck: no JVD, carotid bruits, or masses Cardiac: Irregularly irregular, no murmurs, rubs, or gallops, trace lower extremity nonpitting edema  Respiratory:  clear to auscultation bilaterally, normal work of breathing GI: soft, nontender, nondistended, + BS MS: no deformity or atrophy  Skin: warm and dry, no rash Neuro:  Strength and sensation are intact Psych: euthymic mood, full affect    Recent Labs: 03/03/2017: ALT 11; BUN 17; Creatinine, Ser 0.86; Potassium 4.3; Sodium 140    Lipid Panel Lab Results  Component Value Date   CHOL 201 (H) 03/03/2017   HDL 41.60 03/03/2017   LDLCALC 140 (H) 03/03/2017   TRIG 97.0 03/03/2017      Wt Readings from Last 3 Encounters:  09/08/17 187 lb 8 oz (85 kg)  06/23/17 183 lb 4 oz (83.1 kg)  06/08/17 183 lb 12 oz (83.3 kg)       ASSESSMENT AND  PLAN:  Atrial fibrillation, unspecified type ([place]) - Plan: EKG 12-Lead Chronic atrial fibrillation, rate well controlled, tolerating anticoagulation on warfarin Tolerating metoprolol 50, takes this at night  Chronic diastolic CHF (congestive heart failure) ([place]) - Plan: EKG 12-Lead Using Lasix only as needed, weight is stable  Chronic fatigue Etiology unclear, recommended she experiment and hold the clonidine If blood pressure runs high we could increase hydralazine up to 75 3 times a day  Hypertension Medication changes as above in effort to improve her fatigue and general malaise  Hyperlipidemia Cholesterol running high Stressed the importance of compliance of her statin, Lipitor  Morbid obesity Stable weight     Total encounter time more than 25 minutes  Greater than 50% was spent in counseling and coordination of care with the patient  Disposition:   F/U  6 months   No orders of the defined types were placed in this encounter.    Signed, Esmond Plants, M.D., Ph.D. 11/06/2017  [place], [place]

## 2017-11-09 ENCOUNTER — Ambulatory Visit: Payer: Medicare PPO | Admitting: Cardiovascular Disease

## 2017-12-02 ENCOUNTER — Ambulatory Visit (INDEPENDENT_AMBULATORY_CARE_PROVIDER_SITE_OTHER): Payer: Medicare PPO | Admitting: General Practice

## 2017-12-02 DIAGNOSIS — Z7901 Long term (current) use of anticoagulants: Secondary | ICD-10-CM | POA: Diagnosis not present

## 2017-12-02 LAB — POCT INR: INR: 2.6 (ref 2.0–3.0)

## 2017-12-02 NOTE — Patient Instructions (Addendum)
Pre visit review using our clinic review tool, if applicable. No additional management support is needed unless otherwise documented below in the visit note.  Continue to take 1/2 tablet daily except 1 tablet Tues and Saturdays.  Re-check in 4 weeks.

## 2017-12-05 NOTE — Progress Notes (Signed)
Cardiology Office Note  Date:  12/07/2017   ID:  FREDRIKA CANBY, DOB 03-18-1929, MRN [id number]  PCP:  Jinny Sanders, MD   Chief Complaint  Patient presents with  . OTHER    6 month f/u c/o edema ankles/sob due to hernia and discuss med for joint pain. Pt has not taken Lipitor in months. Meds reviewed verbally with pt.    HPI:  Ms. Disbrow is a very pleasant 84 year old woman with a history of  chronic atrial fibrillation on warfarin,  hypertension,  chronic lower extremity edema,  shortness of breath,  who presents for routine followup of her atrial fibrillation  And fatigue  In follow-up today she is having some atypical upper epigastric discomfort sometimes when she walks Trying various creams She attributes this to a GI problem, "hiatal hernia"  Continued chronic fatigue, no significant change No regular exercise program Limited by chronic back pain and hip pain and sciatica No regular exercise program Checks blood pressure at home, reports it is typically well controlled Weight is stable  Active, does not like to use her cane, no recent falls Only taking clonidine once a day  Lab work reviewed in detail with her AIC 6.1 Total chol 184, LDL 112  EKG personally reviewed by myself on todays visit Atrial fibrillation with ventricular rate 51 bpm nonspecific ST abnormality, left axis deviation  Other past medical history reviewed Previous  fall with hematoma of the right lower extremity,  required care from the wound clinic for a large ulcerative wound on the right lateral aspect of her leg. This has healed well. History of medication confusion, some noncompliance. Prior trauma to her right lower extremity, surgery to her left ankle  Echo 08/2009 EF 45 to 50%   Calcium channel blocker/diltiazem was held in the past secondary to edema.    PMH:   has a past medical history of Allergic rhinitis, cause unspecified, Atrial fibrillation ([place]), Atrial flutter ([place]), Bacterial  pneumonia, unspecified, Colonic polyp, Congestive heart failure, unspecified, Diabetes mellitus without complication ([place]), Diaphragmatic hernia without mention of obstruction or gangrene, Displacement of lumbar intervertebral disc without myelopathy, Diverticulosis of colon (without mention of hemorrhage), Edema, Esophageal reflux, Long term (current) use of anticoagulants, Osteoarthrosis, unspecified whether generalized or localized, unspecified site, Other abnormal glucose, Other and unspecified hyperlipidemia, Other malaise and fatigue, Overweight(278.02), Prediabetes, Secondary cardiomyopathy, unspecified, Splenic mass (08/21/2011), Stricture and stenosis of esophagus, Thigh abscess, and Unspecified essential hypertension.  PSH:    Past Surgical History:  Procedure Laterality Date  . ABDOMINAL HYSTERECTOMY  1971   partial  . ANKLE SURGERY Left 2001  . CHOLECYSTECTOMY  2004  . COLONOSCOPY    . ESOPHAGEAL DILATION  01/2007  . POLYPECTOMY    . spleen removed     2005 or 2006 in [place]    Current Outpatient Medications  Medication Sig Dispense Refill  . ACCU-CHEK FASTCLIX LANCETS MISC Use to check blood sugar 2 times a day.  Dx: E11.8 204 each 3  . acetaminophen (TYLENOL ARTHRITIS PAIN) 650 MG CR tablet Take 650 mg by mouth daily.    . Ascorbic Acid (VITAMIN C PO) Take 1 tablet by mouth daily.    . Azelastine HCl 137 MCG/SPRAY SOLN Place 1-2 sprays into the nose 2 (two) times daily. 30 mL 11  . benzonatate (TESSALON) 100 MG capsule TAKE 2 CAPSULES BY MOUTH TWICE DAILY AS NEEDED FOR COUGH 120 capsule 0  . Blood Glucose Monitoring Suppl (ACCU-CHEK GUIDE) w/Device KIT 1 each by  Does not apply route 2 (two) times daily. Dx: E11.8 1 kit 0  . Blood Pressure KIT Use daily as needed to check blood pressure. 1 each 0  . Cholecalciferol (VITAMIN D-3 PO) Take 1 tablet by mouth daily.    . cloNIDine (CATAPRES) 0.1 MG tablet Take 0.5 tablets (0.05 mg total) by mouth 2 (two) times daily. 90 tablet 3   . digoxin (LANOXIN) 0.125 MG tablet Take 1 tablet (125 mcg total) by mouth daily. 90 tablet 3  . fexofenadine (ALLEGRA) 180 MG tablet Take 1 tablet (180 mg total) by mouth daily. 90 tablet 3  . fluticasone (FLONASE) 50 MCG/ACT nasal spray Place 2 sprays into both nostrils daily. 45 g 3  . Fluticasone-Salmeterol (ADVAIR) 250-50 MCG/DOSE AEPB Inhale 1 puff into the lungs 2 (two) times daily. 1 each 11  . furosemide (LASIX) 20 MG tablet TAKE 1 TABLET EVERY DAY 90 tablet 1  . glucose blood (ACCU-CHEK GUIDE) test strip Use to check blood sugar 2 times a day.  Dx: E11.8 100 each 12  . hydrALAZINE (APRESOLINE) 50 MG tablet Take 1 tablet (50 mg total) by mouth 3 (three) times daily. 270 tablet 3  . hydrochlorothiazide (HYDRODIURIL) 25 MG tablet Take 1 tablet (25 mg total) by mouth daily. 90 tablet 3  . latanoprost (XALATAN) 0.005 % ophthalmic solution     . losartan (COZAAR) 100 MG tablet Take 1 tablet (100 mg total) by mouth daily. 90 tablet 3  . metoprolol succinate (TOPROL-XL) 50 MG 24 hr tablet TAKE 1 TABLET (50 MG TOTAL) BY MOUTH DAILY. TAKE WITH OR IMMEDIATELY FOLLOWING A MEAL. 90 tablet 1  . montelukast (SINGULAIR) 10 MG tablet Take 1 tablet (10 mg total) by mouth at bedtime. 90 tablet 3  . omeprazole (PRILOSEC) 20 MG capsule TAKE 1 CAPSULE TWICE DAILY BEFORE A MEAL 180 capsule 1  . timolol (TIMOPTIC) 0.5 % ophthalmic solution     . tiZANidine (ZANAFLEX) 2 MG tablet Take 1 tablet (2 mg total) by mouth every 6 (six) hours as needed for muscle spasms. 30 tablet 3  . traMADol (ULTRAM) 50 MG tablet Take 0.5 tablets (25 mg total) by mouth every 8 (eight) hours as needed. 60 tablet 0  . warfarin (COUMADIN) 5 MG tablet TAKE AS DIRECTED  BY  ANTI  COAGULATION  CLINIC 105 tablet 0  . atorvastatin (LIPITOR) 80 MG tablet Take 80 mg by mouth daily.      No current facility-administered medications for this visit.      Allergies:   Morphine and related; Percocet [oxycodone-acetaminophen]; and Tramadol    Social History:  The patient  reports that she has never smoked. She has never used smokeless tobacco. She reports that she does not drink alcohol or use drugs.   Family History:   family history includes Bone cancer in her son; Coronary artery disease (age of onset: 78) in her mother; Diabetes in her daughter and daughter; Emphysema (age of onset: 64) in her father; Hyperlipidemia in her sister and sister; Hypertension in her daughter and daughter; Hypertension (age of onset: 39) in her mother; Ovarian cancer in her daughter.    Review of Systems: Review of Systems  Constitutional: Positive for malaise/fatigue.  Respiratory: Negative.   Cardiovascular: Negative.   Gastrointestinal: Negative.   Musculoskeletal: Positive for back pain and joint pain.  Neurological: Negative.   Psychiatric/Behavioral: Negative.   All other systems reviewed and are negative.    PHYSICAL EXAM: VS:  BP (!) 158/80 (BP Location: Left  Arm, Patient Position: Sitting, Cuff Size: Normal)   Pulse (!) 51   Ht _0  (1.626 m)   Wt 177 lb 8 oz (80.5 kg)   BMI 30.47 kg/m  , BMI Body mass index is 30.47 kg/m. Constitutional:  oriented to person, place, and time. No distress.  HENT:  Head: Normocephalic and atraumatic.  Eyes:  no discharge. No scleral icterus.  Neck: Normal range of motion. Neck supple. No JVD present.  Cardiovascular: Normal rate, regular rhythm, normal heart sounds and intact distal pulses. Exam reveals no gallop and no friction rub. Trace pitting edema lower extremities No murmur heard. Pulmonary/Chest: Effort normal and breath sounds normal. No stridor. No respiratory distress.  no wheezes.  no rales.  no tenderness.  Abdominal: Soft.  no distension.  no tenderness.  Musculoskeletal: Normal range of motion.  no  tenderness or deformity.  Neurological:  normal muscle tone. Coordination normal. No atrophy Skin: Skin is warm and dry. No rash noted. not diaphoretic.  Psychiatric:  normal mood  and affect. behavior is normal. Thought content normal.   Recent Labs: 03/03/2017: ALT 11; BUN 17; Creatinine, Ser 0.86; Potassium 4.3; Sodium 140    Lipid Panel Lab Results  Component Value Date   CHOL 201 (H) 03/03/2017   HDL 41.60 03/03/2017   LDLCALC 140 (H) 03/03/2017   TRIG 97.0 03/03/2017      Wt Readings from Last 3 Encounters:  12/07/17 177 lb 8 oz (80.5 kg)  09/08/17 187 lb 8 oz (85 kg)  06/23/17 183 lb 4 oz (83.1 kg)       ASSESSMENT AND PLAN:  Atrial fibrillation, unspecified type ([place]) - Plan: EKG 12-Lead Chronic atrial fibrillation Ventricular rate running slow, recommended she decrease metoprolol down to 25 mg daily New prescription provided If rate continues to run low would stop the digoxin  Chronic diastolic CHF (congestive heart failure) ([place]) - Plan: EKG 12-Lead Using Lasix as needed,  weight is stable For leg swelling consistent with dependent edema Unable to tolerate compression hose  Chronic fatigue Likely secondary to deconditioning Recommended regular walking program  Hypertension Suggested she take clonidine twice a day not once a day  Hyperlipidemia Stressed the importance of compliance of her statin, Lipitor She has not been taking it as prescription ran out. New prescription provided  Morbid obesity Stable weight recommended exercise program as tolerated with her back pain    Total encounter time more than 25 minutes  Greater than 50% was spent in counseling and coordination of care with the patient  Disposition:   F/U  12 months   Orders Placed This Encounter  Procedures  . EKG 12-Lead     Signed, Esmond Plants, M.D., Ph.D. 12/07/2017  [place], [place]

## 2017-12-07 ENCOUNTER — Encounter: Payer: Self-pay | Admitting: Cardiovascular Disease

## 2017-12-07 ENCOUNTER — Ambulatory Visit (INDEPENDENT_AMBULATORY_CARE_PROVIDER_SITE_OTHER): Payer: Medicare PPO | Admitting: Cardiovascular Disease

## 2017-12-07 VITALS — BP 158/80 | HR 51 | Ht 64.0 in | Wt 177.5 lb

## 2017-12-07 DIAGNOSIS — E782 Mixed hyperlipidemia: Secondary | ICD-10-CM

## 2017-12-07 DIAGNOSIS — I482 Chronic atrial fibrillation: Secondary | ICD-10-CM | POA: Diagnosis not present

## 2017-12-07 DIAGNOSIS — I5032 Chronic diastolic (congestive) heart failure: Secondary | ICD-10-CM | POA: Diagnosis not present

## 2017-12-07 DIAGNOSIS — I4821 Permanent atrial fibrillation: Secondary | ICD-10-CM

## 2017-12-07 DIAGNOSIS — I10 Essential (primary) hypertension: Secondary | ICD-10-CM

## 2017-12-07 DIAGNOSIS — E118 Type 2 diabetes mellitus with unspecified complications: Secondary | ICD-10-CM

## 2017-12-07 MED ORDER — METOPROLOL SUCCINATE ER 25 MG PO TB24: 25.0000 mg | ORAL_TABLET | Freq: Every day | ORAL | 3 refills | Status: DC

## 2017-12-07 MED ORDER — ATORVASTATIN CALCIUM 80 MG PO TABS: 80.0000 mg | ORAL_TABLET | Freq: Every day | ORAL | 3 refills | Status: DC

## 2017-12-07 NOTE — Patient Instructions (Addendum)
Please monitor blood pressure at home Call the office if it runs high (>150 on the top)   Medication Instructions:   Please decrease the metoprolol down to 25 mg daily Add back the morning clonidine Restart the lipitor  Labwork:  Labs today: CMP, HBA1C today  Testing/Procedures:  No further testing at this time   Follow-Up: It was a pleasure seeing you in the office today. Please call us if you have new issues that need to be addressed before your next appt.  [phone]  Your physician wants you to follow-up in: 12 months.  You will receive a reminder letter in the mail two months in advance. If you don't receive a letter, please call our office to schedule the follow-up appointment.  If you need a refill on your cardiac medications before your next appointment, please call your pharmacy.  For educational health videos Log in to : [email or web address] Or : [email or web address], password : [email or web address]

## 2017-12-08 LAB — HEMOGLOBIN A1C
Est. average glucose Bld gHb Est-mCnc: 128 mg/dL
Hgb A1c MFr Bld: 6.1 % — ABNORMAL HIGH (ref 4.8–5.6)

## 2017-12-08 LAB — COMPREHENSIVE METABOLIC PANEL
ALBUMIN: 4.3 g/dL (ref 3.5–4.7)
ALT: 9 IU/L (ref 0–32)
AST: 11 IU/L (ref 0–40)
Albumin/Globulin Ratio: 1.7 (ref 1.2–2.2)
Alkaline Phosphatase: 91 IU/L (ref 39–117)
BUN / CREAT RATIO: 16 (ref 12–28)
BUN: 15 mg/dL (ref 8–27)
Bilirubin Total: 0.3 mg/dL (ref 0.0–1.2)
CALCIUM: 10.2 mg/dL (ref 8.7–10.3)
CO2: 26 mmol/L (ref 20–29)
CREATININE: 0.94 mg/dL (ref 0.57–1.00)
Chloride: 101 mmol/L (ref 96–106)
GFR calc non Af Amer: 55 mL/min/{1.73_m2} — ABNORMAL LOW (ref >59)
GFR, EST AFRICAN AMERICAN: 64 mL/min/{1.73_m2} (ref >59)
GLUCOSE: 105 mg/dL — AB (ref 65–99)
Globulin, Total: 2.5 g/dL (ref 1.5–4.5)
Potassium: 3.7 mmol/L (ref 3.5–5.2)
Sodium: 142 mmol/L (ref 134–144)
TOTAL PROTEIN: 6.8 g/dL (ref 6.0–8.5)

## 2017-12-23 ENCOUNTER — Encounter: Payer: Self-pay | Admitting: Family Medicine

## 2017-12-23 ENCOUNTER — Ambulatory Visit: Payer: Medicare PPO | Admitting: Family Medicine

## 2017-12-23 ENCOUNTER — Telehealth: Payer: Self-pay | Admitting: Cardiovascular Disease

## 2017-12-23 ENCOUNTER — Encounter: Payer: Self-pay | Admitting: *Deleted

## 2017-12-23 VITALS — BP 164/90 | HR 76 | Temp 98.4°F | Ht 62.5 in | Wt 173.5 lb

## 2017-12-23 DIAGNOSIS — G8929 Other chronic pain: Secondary | ICD-10-CM | POA: Diagnosis not present

## 2017-12-23 DIAGNOSIS — M545 Low back pain: Secondary | ICD-10-CM | POA: Diagnosis not present

## 2017-12-23 DIAGNOSIS — M5416 Radiculopathy, lumbar region: Secondary | ICD-10-CM

## 2017-12-23 MED ORDER — DEXAMETHASONE SODIUM PHOSPHATE 100 MG/10ML IJ SOLN
10.0000 mg | Freq: Once | INTRAMUSCULAR | Status: AC
  Administered 2017-12-23: 10 mg via INTRAMUSCULAR

## 2017-12-23 MED ORDER — PREDNISONE 20 MG PO TABS: ORAL_TABLET | ORAL | 0 refills | Status: DC

## 2017-12-23 NOTE — Patient Instructions (Addendum)
Wait until Saturday, then start the oral prednisone.   The shot of steroids will help some today - and give it a few days.

## 2017-12-23 NOTE — Telephone Encounter (Signed)
Atorvastatin 40mg  is similar in size to the 80mg  tablet. Rosuvastatin tablets may be slightly smaller however not greatly so. Equivalent dose would be rosuvastatin 40mg  daily if pt wanted to see if this is more palatable. Unfortunately no other statins would be equivalent in terms of lipid lowering therapy. Pt could ask to see if her pharmacy is able to order atorvastatin tablets from a different manufacturer (some are round vs oblong).

## 2017-12-23 NOTE — Telephone Encounter (Signed)
Pt c/o medication issue:  1. Name of Medication:  Lipitor   2. How are you currently taking this medication (dosage and times per day)? 80 mg po q day   3. Are you having a reaction (difficulty breathing--STAT)?  No   4. What is your medication issue? Pill is too large even when cut in half to swallow.  Patient has not been taking and would like to know if  A new rx can be sent in for 2 tablets of 40 mg po q day instead of 1 80 mg tablet.

## 2017-12-23 NOTE — Telephone Encounter (Signed)
Routing to Pharmacy. Is the 40 mg tablet smaller than the 80mg ? Is this usually covered by insurance?

## 2017-12-23 NOTE — Progress Notes (Signed)
Dr. Karleen Hampshire T. Josimar Corning, MD, CAQ Sports [facility] [address] [place], [address] Phone: [phone] Fax: [phone]  12/23/2017  Patient: Angela Ali, MRN: [id number], DOB: Mar 08, 1929, 84 y.o.  Primary Physician:  Excell Seltzer, MD   Chief Complaint  Patient presents with  . Follow-up    Back Pain-Radiates down to her feet   Subjective:   Angela Ali is a 84 y.o. very pleasant female patient who presents with the following:  Back pain and radiates all the way down the L side, some R knee pain.   Also has a GI bug and some nausea.   Patient is having some more acute left-sided lumbar radiculopathy, and she also has had chronic back pain off and on for some years with diffuse spondyloarthropathy as well as disc changes, and I have seen her as well as multiple other providers of seeing her in the past.  She is frustrated by some of these limitations, she is age 53.  She is taking some Tylenol and tramadol now, and she is also use multiple muscle relaxants in the past, and she is on Coumadin.  Past Medical History, Surgical History, Social History, Family History, Problem List, Medications, and Allergies have been reviewed and updated if relevant.  Patient Active Problem List   Diagnosis Date Noted  . Post-nasal drip 06/08/2017  . Long term (current) use of anticoagulants 04/08/2017  . First degree burn of right forearm 02/19/2017  . Bulging lumbar disc 11/20/2016  . Mass on back 02/18/2016  . Obesity with serious comorbidity 11/25/2015  . Sacroiliac joint pain 09/26/2015  . Lipoma of back 06/06/2015  . Osteoarthritis of both knees 01/18/2015  . Counseling regarding end of life decision making 09/07/2014  . Encounter for therapeutic drug monitoring 06/05/2013  . Chronic diastolic CHF (congestive heart failure) (HCC) 03/21/2013  . Chronic cough 02/03/2013  . Recurrent boils 06/22/2012  . Chronic constipation 12/01/2011  .  Splenic mass 08/21/2011  . Hx of Hematoma of leg, with chronic leg weakness and numbness 09/15/2010  . Moderate persistent asthma in adult without complication 07/03/2009  . FATIGUE, CHRONIC 03/19/2009  . Vitamin D deficiency 02/26/2009  . LOW BACK PAIN, CHRONIC 11/19/2008  . Secondary cardiomyopathy (HCC) 10/02/2008  . Allergic rhinitis due to pollen 03/23/2008  . BENIGN NEOPLASM OTH&UNSPEC SITE DIGESTIVE SYSTEM 02/01/2007  . STRICTURE, ESOPHAGEAL 02/01/2007  . Diabetes mellitus type 2 with complications (HCC) 12/24/2006  . Osteoporosis 12/21/2006  . Hyperlipemia 12/16/2006  . Essential hypertension, benign 12/16/2006  . Atrial fibrillation (HCC) 12/16/2006  . Congestive heart failure (HCC) 12/16/2006  . GERD 12/16/2006  . HIATAL HERNIA 12/16/2006  . OSTEOARTHRITIS 12/16/2006  . KNEE PAIN, RIGHT, CHRONIC 12/16/2006  . HERNIATED LUMBAR DISC 12/16/2006  . PERIPHERAL EDEMA 12/16/2006  . COLONIC POLYPS, HYPERPLASTIC 08/26/2000  . DIVERTICULOSIS, COLON 08/26/2000    Past Medical History:  Diagnosis Date  . Allergic rhinitis, cause unspecified   . Atrial fibrillation (HCC)   . Atrial flutter (HCC)   . Bacterial pneumonia, unspecified   . Colonic polyp   . Congestive heart failure, unspecified   . Diabetes mellitus without complication (HCC)    borderline-checks sugars but no meds  . Diaphragmatic hernia without mention of obstruction or gangrene   . Displacement of lumbar intervertebral disc without myelopathy   . Diverticulosis of colon (without mention of hemorrhage)   . Edema   . Esophageal reflux   . Long term (current) use  of anticoagulants   . Osteoarthrosis, unspecified whether generalized or localized, unspecified site   . Other abnormal glucose   . Other and unspecified hyperlipidemia   . Other malaise and fatigue   . Overweight(278.02)   . Prediabetes   . Secondary cardiomyopathy, unspecified   . Splenic mass 08/21/2011  . Stricture and stenosis of esophagus   .  Thigh abscess   . Unspecified essential hypertension     Past Surgical History:  Procedure Laterality Date  . ABDOMINAL HYSTERECTOMY  1971   partial  . ANKLE SURGERY Left 2001  . CHOLECYSTECTOMY  2004  . COLONOSCOPY    . ESOPHAGEAL DILATION  01/2007  . POLYPECTOMY    . spleen removed     2005 or 2006 in [place]    Social History   Socioeconomic History  . Marital status: Widowed    Spouse name: Not on file  . Number of children: 8  . Years of education: Not on file  . Highest education level: Not on file  Occupational History  . 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  Used to live in [place], [place], now in [place] with her family  Social Needs  . Financial resource strain: Not on file  . Food insecurity:    Worry: Not on file    Inability: Not on file  . Transportation needs:    Medical: Not on file    Non-medical: Not on file  Tobacco Use  . Smoking status: Never Smoker  . Smokeless tobacco: Never Used  Substance and Sexual Activity  . Alcohol use: No    Alcohol/week: 0.0 standard drinks  . Drug use: No  . Sexual activity: Never  Lifestyle  . Physical activity:    Days per week: Not on file    Minutes per session: Not on file  . Stress: Not on file  Relationships  . Social connections:    Talks on phone: Not on file    Gets together: Not on file    Attends religious service: Not on file    Active member of club or organization: Not on file    Attends meetings of clubs or organizations: Not on file    Relationship status: Not on file  . Intimate partner violence:    Fear of current or ex partner: Not on file    Emotionally abused: Not on file    Physically abused: Not on file    Forced sexual activity: Not on file  Other Topics Concern  . Not on file  Social History Narrative   Lost spouse to cancer in 07/2006   5 children   Does not get regular exercise    Family History  Problem Relation Age of  Onset  . Emphysema Father 61  . Coronary artery disease Mother 37  . Hypertension Mother 76  . Ovarian cancer Daughter   . Bone cancer Son        bone marrow ca  . Hyperlipidemia Sister        # 1  . Hyperlipidemia Sister        # 2  . Diabetes Daughter        # 1  . Hypertension Daughter   . Diabetes Daughter        # 2   . Hypertension Daughter   . Colon cancer Neg Hx     Allergies  Allergen Reactions  . Morphine And Related   .  Percocet [Oxycodone-Acetaminophen] Nausea Only       . Tramadol Nausea Only    Medication list reviewed and updated in full in [facility].  GEN: No fevers, chills. Nontoxic. Primarily MSK c/o today. MSK: Detailed in the HPI GI: tolerating PO intake without difficulty Neuro: No numbness, parasthesias, or tingling associated. Otherwise the pertinent positives of the ROS are noted above.   Objective:   BP (!) 164/90   Pulse 76   Temp 98.4 F (36.9 C) (Oral)   Ht 5' 2.5" (1.588 m)   Wt 173 lb 8 oz (78.7 kg)   BMI 31.23 kg/m    GEN: WDWN, NAD, Non-toxic, Alert & Oriented x 3 HEENT: Atraumatic, Normocephalic.  Ears and Nose: No external deformity. EXTR: No clubbing/cyanosis/edema NEURO: Normal gait.  PSYCH: Normally interactive. Conversant. Not depressed or anxious appearing.  Calm demeanor.    Lower extremities are neurovascularly intact throughout.  Sensation and strength appear grossly normal.  She does have some tenderness from L3-S1 bilaterally.  Straight leg raise on the left does cause some provokable radicular symptoms.  Radiology: No results found.  Assessment and Plan:   Left lumbar radiculopathy - Plan: dexamethasone (DECADRON) injection 10 mg  Chronic low back pain, unspecified back pain laterality, with sciatica presence unspecified  I am not sure how much I had to add with the this patient chronically with her chronic back pain, but with her acute changes get a give her a shot of Decadron.  This is to  bypass the GI tract given that she currently has some GI illness symptoms.  Start some prednisone in several days.  Follow-up: No follow-ups on file.  Meds ordered this encounter  Medications  . predniSONE (DELTASONE) 20 MG tablet    Sig: 2 tabs po for 4 days, then 1 tab po for 4 days    Dispense:  12 tablet    Refill:  0  . dexamethasone (DECADRON) injection 10 mg   Signed,  Ethie Curless T. Raygan Skarda, MD   Allergies as of 12/23/2017      Reactions   Morphine And Related    Percocet [oxycodone-acetaminophen] Nausea Only      Tramadol Nausea Only      Medication List        Accurate as of 12/23/17 11:59 PM. Always use your most recent med list.          ACCU-CHEK FASTCLIX LANCETS Misc Use to check blood sugar 2 times a day.  Dx: E11.8   ACCU-CHEK GUIDE w/Device Kit 1 each by Does not apply route 2 (two) times daily. Dx: E11.8   atorvastatin 80 MG tablet Commonly known as:  LIPITOR Take 1 tablet (80 mg total) by mouth daily.   Azelastine HCl 137 MCG/SPRAY Soln Place 1-2 sprays into the nose 2 (two) times daily.   benzonatate 100 MG capsule Commonly known as:  TESSALON TAKE 2 CAPSULES BY MOUTH TWICE DAILY AS NEEDED FOR COUGH   Blood Pressure Kit Use daily as needed to check blood pressure.   cloNIDine 0.1 MG tablet Commonly known as:  CATAPRES Take 0.5 tablets (0.05 mg total) by mouth 2 (two) times daily.   digoxin 0.125 MG tablet Commonly known as:  LANOXIN Take 1 tablet (125 mcg total) by mouth daily.   fexofenadine 180 MG tablet Commonly known as:  ALLEGRA Take 1 tablet (180 mg total) by mouth daily.   fluticasone 50 MCG/ACT nasal spray Commonly known as:  FLONASE Place 2 sprays into both nostrils  daily.   Fluticasone-Salmeterol 250-50 MCG/DOSE Aepb Commonly known as:  ADVAIR Inhale 1 puff into the lungs 2 (two) times daily.   furosemide 20 MG tablet Commonly known as:  LASIX TAKE 1 TABLET EVERY DAY   glucose blood test strip Use to check blood  sugar 2 times a day.  Dx: E11.8   hydrALAZINE 50 MG tablet Commonly known as:  APRESOLINE Take 1 tablet (50 mg total) by mouth 3 (three) times daily.   hydrochlorothiazide 25 MG tablet Commonly known as:  HYDRODIURIL Take 1 tablet (25 mg total) by mouth daily.   latanoprost 0.005 % ophthalmic solution Commonly known as:  XALATAN   losartan 100 MG tablet Commonly known as:  COZAAR Take 1 tablet (100 mg total) by mouth daily.   metoprolol succinate 25 MG 24 hr tablet Commonly known as:  TOPROL-XL Take 1 tablet (25 mg total) by mouth daily. Take with or immediately following a meal.   montelukast 10 MG tablet Commonly known as:  SINGULAIR Take 1 tablet (10 mg total) by mouth at bedtime.   omeprazole 20 MG capsule Commonly known as:  PRILOSEC TAKE 1 CAPSULE TWICE DAILY BEFORE A MEAL   predniSONE 20 MG tablet Commonly known as:  DELTASONE 2 tabs po for 4 days, then 1 tab po for 4 days   timolol 0.5 % ophthalmic solution Commonly known as:  TIMOPTIC   tiZANidine 2 MG tablet Commonly known as:  ZANAFLEX Take 1 tablet (2 mg total) by mouth every 6 (six) hours as needed for muscle spasms.   traMADol 50 MG tablet Commonly known as:  ULTRAM Take 0.5 tablets (25 mg total) by mouth every 8 (eight) hours as needed.   TYLENOL ARTHRITIS PAIN 650 MG CR tablet Generic drug:  acetaminophen Take 650 mg by mouth daily.   VITAMIN C PO Take 1 tablet by mouth daily.   VITAMIN D-3 PO Take 1 tablet by mouth daily.   warfarin 5 MG tablet Commonly known as:  COUMADIN Take as directed by the anticoagulation clinic. If you are unsure how to take this medication, talk to your nurse or doctor. Original instructions:  TAKE AS DIRECTED  BY  ANTI  COAGULATION  CLINIC

## 2017-12-24 NOTE — Telephone Encounter (Signed)
Patient not with person who answered the phone. Asked her to let her know we called.

## 2017-12-24 NOTE — Telephone Encounter (Signed)
No answer. Left message to call back.   

## 2017-12-28 MED ORDER — ATORVASTATIN CALCIUM 40 MG PO TABS: 80.0000 mg | ORAL_TABLET | Freq: Every day | ORAL | 3 refills | Status: DC

## 2017-12-28 NOTE — Telephone Encounter (Signed)
Patient calling back. She is willing to try the lipitor 40 mg tablets, take 2 tablets once a day. Rx sent to pharmacy.

## 2017-12-30 ENCOUNTER — Ambulatory Visit: Payer: Self-pay

## 2017-12-30 ENCOUNTER — Other Ambulatory Visit (INDEPENDENT_AMBULATORY_CARE_PROVIDER_SITE_OTHER): Payer: Medicare PPO

## 2017-12-30 ENCOUNTER — Telehealth: Payer: Self-pay

## 2017-12-30 ENCOUNTER — Ambulatory Visit: Payer: Medicare PPO

## 2017-12-30 DIAGNOSIS — Z7901 Long term (current) use of anticoagulants: Secondary | ICD-10-CM

## 2017-12-30 DIAGNOSIS — I4821 Permanent atrial fibrillation: Secondary | ICD-10-CM

## 2017-12-30 DIAGNOSIS — Z5181 Encounter for therapeutic drug level monitoring: Secondary | ICD-10-CM | POA: Diagnosis not present

## 2017-12-30 DIAGNOSIS — I482 Chronic atrial fibrillation: Secondary | ICD-10-CM | POA: Diagnosis not present

## 2017-12-30 LAB — POCT INR: INR: 3.6 — AB (ref 2.0–3.0)

## 2017-12-30 NOTE — Patient Instructions (Signed)
INR Today 3.6  Hold coumadin tomorrow (12/31/17) as has already taken today 9/5 and then take 1/2 pill (2.5mg ) on Saturday.  Patient finishes prednisone on Saturday (9/7) and will resume prior dosing schedule on 9/8 of 2.5mg  daily EXCEPT for 5mg  on Tuesday and Saturday and recheck in 2 weeks.    Patient is aware of risks associated with a  supratherapeutic level, she denies any unusual bruising or bleeding.  She will go to ER if any concerns develop.

## 2017-12-30 NOTE — Telephone Encounter (Signed)
Copied from [place] [id number]. Topic: General - Other >> Dec 30, 2017 12:03 PM Yvette Rack wrote: Reason for CRM: pt calling Diamond Nickel, RN back about her labs and medicine Estill Bamberg wanted pt to call back as soon as possible

## 2017-12-30 NOTE — Telephone Encounter (Signed)
Spoke with patient earlier this afternoon and dosing instructions given.  Refer to coag encounter.

## 2018-01-04 ENCOUNTER — Telehealth: Payer: Self-pay

## 2018-01-04 NOTE — Telephone Encounter (Signed)
Prior Auth Started for Atorvastatin 40MG  Key: [id number] - PA Case ID: [id number] - Rx #: [id number]  Awaiting Approval.

## 2018-01-05 NOTE — Telephone Encounter (Signed)
Spoke with patient and she states that insurance is requiring prior authorization. Advised that we would take care of this when it comes in and to call back if she should have any further questions or concerns. She verbalized understanding with no further questions at this time.

## 2018-01-05 NOTE — Telephone Encounter (Signed)
Patient would like clarification on the Lipitor authorization process Patient would also like to discuss taking tumeric Please call to discuss

## 2018-01-05 NOTE — Telephone Encounter (Signed)
Approved on September 10 PA Case: [id number],  Status: Approved,  Coverage Starts on: 01/04/2018, Coverage Ends on: 01/05/2019  Questions? Contact [phone].

## 2018-01-13 ENCOUNTER — Ambulatory Visit: Payer: Medicare PPO | Admitting: General Practice

## 2018-01-13 ENCOUNTER — Ambulatory Visit: Payer: Medicare PPO

## 2018-01-13 DIAGNOSIS — Z7901 Long term (current) use of anticoagulants: Secondary | ICD-10-CM

## 2018-01-13 LAB — POCT INR: INR: 2.3 (ref 2.0–3.0)

## 2018-01-13 NOTE — Patient Instructions (Addendum)
Pre visit review using our clinic review tool, if applicable. No additional management support is needed unless otherwise documented below in the visit note.  Continue to take 2.5mg daily EXCEPT for 5mg on Tuesday and Saturday and recheck in 4 weeks.   

## 2018-01-24 ENCOUNTER — Telehealth: Payer: Self-pay | Admitting: *Deleted

## 2018-01-24 NOTE — Telephone Encounter (Signed)
Left message for Ms. Loja that Dr. Lorelei Pont doesn't think that he  has much more to offer her (or any of our other sports docs)  I advised that Dr. Lorelei Pont would recommend her seeing one of the physical medicine and rehabilitation specialists who deal with mostly spine. If she is agreeable, I ask her to call us back and Dr. Lorelei Pont would be  happy to make this referral.    Ok for [facility] to find out if patient would like the referral if she calls back.

## 2018-01-24 NOTE — Telephone Encounter (Signed)
I don't think that I have much more to offer her (or any of our other sports docs)  I would recommend seeing one of the physical medicine and rehabilitation specialists who deal with mostly spine. If she is agreeable, I am happy to make this referral.

## 2018-01-24 NOTE — Telephone Encounter (Signed)
Ms. Rickles states that her sciatica/back is killing her.  She states the decadron injection given on 12/23/2017 did not help at all.  She is wanting to know what else Dr. Lorelei Pont can recommend.  Please advise.

## 2018-02-07 ENCOUNTER — Telehealth: Payer: Self-pay | Admitting: Cardiovascular Disease

## 2018-02-07 NOTE — Telephone Encounter (Signed)
Pt c/o medication issue:  1. Name of Medication: Naproxen  2. How are you currently taking this medication (dosage and times per day)?  500 mg po BID   3. Are you having a reaction (difficulty breathing--STAT)? No   4. What is your medication issue? Wants to know if this is safe to take with Coumadin .  Please call to advise .

## 2018-02-07 NOTE — Telephone Encounter (Signed)
Routing to her PCP, since they manage her coumadin.

## 2018-02-07 NOTE — Telephone Encounter (Signed)
Dr. Diona Browner  I spoke with patient this afternoon and she is wanting to take Naproxen 500 mg bid for 2 weeks for ankle and sciatia pain and inflammation. This was prescribed by another doctor.  I advised her that this would increase her INR and that she would need closer monitoring.  What are your thoughts on this?  Do you think she might benefit from a lower dosage?  Please advise.  Thanks, Villa Herb, RN

## 2018-02-08 ENCOUNTER — Telehealth: Payer: Self-pay | Admitting: *Deleted

## 2018-02-08 DIAGNOSIS — M5441 Lumbago with sciatica, right side: Secondary | ICD-10-CM

## 2018-02-08 DIAGNOSIS — M5126 Other intervertebral disc displacement, lumbar region: Secondary | ICD-10-CM

## 2018-02-08 NOTE — Telephone Encounter (Signed)
Copied from [place] [id number]. Topic: Referral - Request for Referral >> Feb 08, 2018 12:09 PM Scherrie Gerlach wrote: Has patient seen PCP for this complaint? yes If NO, is insurance requiring patient see PCP for this issue before PCP can refer them? Referral for which specialty: orthopedic Preferred provider/office: Doree Fudge Reason for referral: back pain Pt would like referral to [facility]. Pt states she has decided to see a specialist for her herniated disk. Pt was asking to speak with Dr Lorelei Pont or Dr Diona Browner, to advise she has decided to see someone for this issue. But pt states Raliegh Ip asked her to get the dr to send all her record information to them.

## 2018-02-08 NOTE — Telephone Encounter (Signed)
I recommend against NSAIDs entirely given increased risk of bleeding, stomach ulcers etc when on coumadin.

## 2018-02-08 NOTE — Telephone Encounter (Signed)
Patient wants to know is there anything else that can be given to her for the inflammation

## 2018-02-08 NOTE — Telephone Encounter (Signed)
Home PT, ice. I am referring her to back specialist as she requested.  thanks!

## 2018-02-09 NOTE — Telephone Encounter (Signed)
Ms. Windhorst notified as instructed by telephone.  She will await call from Molly Maduro about appointment with Percell Miller and Noemi Chapel.

## 2018-02-09 NOTE — Telephone Encounter (Signed)
Please place Referral into Epic for Orthopedic Referral

## 2018-02-15 ENCOUNTER — Other Ambulatory Visit: Payer: Self-pay | Admitting: Family Medicine

## 2018-02-16 NOTE — Telephone Encounter (Signed)
Patient is compliant with coumadin management, will refill x 86mths.

## 2018-02-17 ENCOUNTER — Ambulatory Visit: Payer: Medicare PPO

## 2018-02-22 ENCOUNTER — Ambulatory Visit: Payer: Medicare PPO

## 2018-02-22 DIAGNOSIS — Z23 Encounter for immunization: Secondary | ICD-10-CM | POA: Diagnosis not present

## 2018-02-22 DIAGNOSIS — Z7901 Long term (current) use of anticoagulants: Secondary | ICD-10-CM | POA: Diagnosis not present

## 2018-02-22 LAB — POCT INR: INR: 1.6 — AB (ref 2.0–3.0)

## 2018-02-22 NOTE — Patient Instructions (Addendum)
INR today 1.6  Take 1.5 pills (7.5mg ) today and 1 (5mg ) tomorrow 10/30 and then Continue to take 2.5mg  daily EXCEPT for 5mg  on Tuesday and Saturday and recheck in 2 weeks.  Patient educated on risks associated with a subtherapeutic level and will go to the ER if any concerns develop.      Patient also received her flu shot today to left deltoid.  She tolerated procedure well. She does present with symptoms of allergic rhinitis which has been going on for several weeks.  She is not on any abx and is afebrile today, temp 98.3.  Lungs clear to auscultation and no wheezing, sob or chest pain.  Discussed symptoms with Dr. Diona Browner who also agrees that patient should move forward with receiving flu vaccine today.

## 2018-02-25 ENCOUNTER — Telehealth: Payer: Self-pay | Admitting: *Deleted

## 2018-02-25 NOTE — Telephone Encounter (Signed)
Do not take aleve. Only tylenol.

## 2018-02-25 NOTE — Telephone Encounter (Signed)
Pt contacted office and is wanting to know if she is able to take aleve while on coumadin, for her sciatic pain. pls advise

## 2018-02-25 NOTE — Telephone Encounter (Signed)
Spoke with pt relaying message per Dr. Diona Browner.  Pt verbalizes understanding.

## 2018-03-01 ENCOUNTER — Ambulatory Visit: Payer: Self-pay | Admitting: Podiatry

## 2018-03-03 ENCOUNTER — Ambulatory Visit: Payer: Self-pay | Admitting: Podiatry

## 2018-03-10 ENCOUNTER — Ambulatory Visit: Payer: Medicare PPO | Admitting: General Practice

## 2018-03-10 ENCOUNTER — Ambulatory Visit: Payer: Medicare PPO

## 2018-03-10 DIAGNOSIS — Z7901 Long term (current) use of anticoagulants: Secondary | ICD-10-CM | POA: Diagnosis not present

## 2018-03-10 LAB — POCT INR: INR: 2.8 (ref 2.0–3.0)

## 2018-03-10 NOTE — Patient Instructions (Addendum)
Pre visit review using our clinic review tool, if applicable. No additional management support is needed unless otherwise documented below in the visit note.  Continue to take 2.5mg  daily EXCEPT for 5mg  on Tuesday and Saturday and recheck in 4 weeks.

## 2018-04-05 ENCOUNTER — Telehealth: Payer: Self-pay | Admitting: Family Medicine

## 2018-04-05 NOTE — Telephone Encounter (Signed)
error 

## 2018-04-07 ENCOUNTER — Telehealth: Payer: Self-pay | Admitting: Family Medicine

## 2018-04-07 ENCOUNTER — Ambulatory Visit: Payer: Medicare PPO | Admitting: General Practice

## 2018-04-07 DIAGNOSIS — Z7901 Long term (current) use of anticoagulants: Secondary | ICD-10-CM

## 2018-04-07 LAB — POCT INR: INR: 2.6 (ref 2.0–3.0)

## 2018-04-07 NOTE — Patient Instructions (Addendum)
Pre visit review using our clinic review tool, if applicable. No additional management support is needed unless otherwise documented below in the visit note.  Continue to take 2.5mg  daily EXCEPT for 5mg  on Tuesday and Saturday and recheck in 4 weeks.

## 2018-04-07 NOTE — Telephone Encounter (Signed)
Pt calling and want to speak to you about her back pain. Please call pt

## 2018-04-07 NOTE — Telephone Encounter (Signed)
Agreed -

## 2018-04-07 NOTE — Telephone Encounter (Signed)
Spoke with [name].  She states her sciatica pain started again about 5 days ago.  Pain is in the lower right side of her back and buttocks.  She states she still had a refill on the muscle relaxant that Dr. Lorelei Ali had given her so she refilled that and started taking those.  She states it has eased up some.  Per Dr. Lillie Ali last note he felt like there was nothing else he could office [name] and recommended a referral to PM&R.  [name] did call back in October and ask for a referral to [facility].  She states she did not go see them because when she called they spoke to her about doing a spinal epidural injection and she does not want to do that.  I recommended to [name] to try using a heating pad to her lower back, try doing some gentle stretching and advised she could take tylenol for pain as well as continue using the muscle relaxant as prescribed.  Patient states understanding.

## 2018-04-08 ENCOUNTER — Ambulatory Visit: Payer: Medicare PPO | Admitting: Family Medicine

## 2018-04-08 ENCOUNTER — Encounter

## 2018-04-14 ENCOUNTER — Telehealth: Payer: Self-pay | Admitting: Cardiovascular Disease

## 2018-04-14 NOTE — Telephone Encounter (Signed)
Patient returning call from Coumadin clinic [facility] office

## 2018-04-14 NOTE — Telephone Encounter (Signed)
Pt called and wanted to know if could get prednisone sent to [place] or can pt get a prednisone shot for sciatic pain she is having. The Tylenol and analgesic rubs are not helping pain. Pt does not want to schedule appt until send note to Dr Diona Browner. Pt understands it will be tomorrow for cb. Pt said she is on coumadin so cannot take ibuprofen. Pt next appt is scheduled for 04/29/18 for annual exam.Please advise.

## 2018-04-14 NOTE — Telephone Encounter (Signed)
Pt c/o medication issue:  1. Name of Medication: Celebrex 2. How are you currently taking this medication (dosage and times per day)? Not taking yet  3. Are you having a reaction (difficulty breathing--STAT)? No   4. What is your medication issue?  Patient has afib and coumadin and wants to know if it is safe to take please advise

## 2018-04-14 NOTE — Telephone Encounter (Signed)
Routing to our pharmacy and/or Dr Rockey Situ to review for interaction.

## 2018-04-14 NOTE — Telephone Encounter (Signed)
Returned patient phone call- no answer-left voicemail Would advise limiting celebrex use to as little as possible and for the shortest duration. Try APAP first. Does increase the risk of bleeding/ulcers but using sparingly should be ok

## 2018-04-14 NOTE — Telephone Encounter (Signed)
Patient called back- advised her of the same below. Pt does not have rx for celebrex-just is in a lot of pain, tylenol not working and wondering about her options

## 2018-04-15 ENCOUNTER — Ambulatory Visit: Payer: Medicare PPO | Admitting: Family Medicine

## 2018-04-15 NOTE — Telephone Encounter (Signed)
Needs appt for re-eval and referral.

## 2018-04-15 NOTE — Telephone Encounter (Signed)
Okay to make appt on Monday.

## 2018-04-15 NOTE — Telephone Encounter (Signed)
Spoke to pt. Made appt for 3:30 04-18-18

## 2018-04-15 NOTE — Telephone Encounter (Signed)
Spoke to pt. She is asking if she can be seen on Monday. There are only same day spots left.

## 2018-04-16 NOTE — Telephone Encounter (Signed)
She could check with PMD Perhaps referral to pain clinic for additional options

## 2018-04-18 ENCOUNTER — Ambulatory Visit: Payer: Medicare PPO | Admitting: Family Medicine

## 2018-04-18 ENCOUNTER — Encounter: Payer: Self-pay | Admitting: Family Medicine

## 2018-04-18 VITALS — BP 128/76 | HR 44 | Temp 97.8°F | Ht 62.5 in | Wt 176.0 lb

## 2018-04-18 DIAGNOSIS — M5441 Lumbago with sciatica, right side: Secondary | ICD-10-CM | POA: Diagnosis not present

## 2018-04-18 DIAGNOSIS — M170 Bilateral primary osteoarthritis of knee: Secondary | ICD-10-CM

## 2018-04-18 MED ORDER — TRAMADOL HCL 50 MG PO TABS: 25.0000 mg | ORAL_TABLET | Freq: Two times a day (BID) | ORAL | 0 refills | Status: DC | PRN

## 2018-04-18 NOTE — Progress Notes (Signed)
Subjective:    Patient ID: Angela Ali, female    DOB: 31-Dec-1928, 84 y.o.   MRN: [id number]  HPI 84 year old female with history of  Chronic low back pain, herniated disc, presents for unimproved low back pain with radiation.   Pain in bilateral to low back .. radiates to right leg...both hips 11/2017 given  Decadron and prednisone taper No relief.  Pt called 9/30 for more recommendations... Dr. Lorelei Pont state referral to PMR would be next step. Referral made on 02/07/18  She reports she saw them and they recommended a ESI, but    NSAIDs contraindicated , cannot tolerate lyrica, gabapentin, tylenol ineffective. Unable to afford lidoderm patches. Nausea SE to tramadol ( she states that she took this on  Empty stomach, tolerates better with eating., percocet and morphine. PT in effective in past per pt.  Tizanadine makes her drowsy.  MRI lumbar spine 2016: IMPRESSION:  1. At L3-4 there is a mild broad-based disc bulge with a left  foraminal disc protrusion with superior migration of disc material  with mass effect on the left L3 nerve root.  2. Lumbar spine spondylosis as described above.   Review of Systems  Constitutional: Negative for fatigue and fever.  HENT: Negative for congestion.   Eyes: Negative for pain.  Respiratory: Negative for cough and shortness of breath.   Cardiovascular: Negative for chest pain, palpitations and leg swelling.  Gastrointestinal: Negative for abdominal pain.  Genitourinary: Negative for dysuria and vaginal bleeding.  Musculoskeletal: Negative for back pain.  Neurological: Negative for syncope, light-headedness and headaches.  Psychiatric/Behavioral: Negative for dysphoric mood.       Objective:   Physical Exam Constitutional:      General: She is not in acute distress.    Appearance: Normal appearance. She is well-developed. She is not ill-appearing or toxic-appearing.  HENT:     Head: Normocephalic.     Right Ear: Hearing, tympanic  membrane, ear canal and external ear normal. Tympanic membrane is not erythematous, retracted or bulging.     Left Ear: Hearing, tympanic membrane, ear canal and external ear normal. Tympanic membrane is not erythematous, retracted or bulging.     Nose: No mucosal edema or rhinorrhea.     Right Sinus: No maxillary sinus tenderness or frontal sinus tenderness.     Left Sinus: No maxillary sinus tenderness or frontal sinus tenderness.     Mouth/Throat:     Pharynx: Uvula midline.  Eyes:     General: Lids are normal. Lids are everted, no foreign bodies appreciated.     Conjunctiva/sclera: Conjunctivae normal.     Pupils: Pupils are equal, round, and reactive to light.  Neck:     Musculoskeletal: Normal range of motion and neck supple.     Thyroid: No thyroid mass or thyromegaly.     Vascular: No carotid bruit.     Trachea: Trachea normal.  Cardiovascular:     Rate and Rhythm: Normal rate and regular rhythm.     Pulses: Normal pulses.     Heart sounds: Normal heart sounds, S1 normal and S2 normal. No murmur. No friction rub. No gallop.   Pulmonary:     Effort: Pulmonary effort is normal. No tachypnea or respiratory distress.     Breath sounds: Normal breath sounds. No decreased breath sounds, wheezing, rhonchi or rales.  Abdominal:     General: Bowel sounds are normal.     Palpations: Abdomen is soft.     Tenderness: There is  no abdominal tenderness.  Musculoskeletal:     Right hip: She exhibits normal range of motion and normal strength.     Left hip: She exhibits normal range of motion.     Right knee: She exhibits decreased range of motion, swelling and deformity. Tenderness found.     Left knee: She exhibits decreased range of motion, swelling and deformity. Tenderness found.     Lumbar back: She exhibits decreased range of motion and tenderness. She exhibits no bony tenderness.     Comments: Knock kneed gait  Skin:    General: Skin is warm and dry.     Findings: No rash.    Neurological:     Mental Status: She is alert.  Psychiatric:        Mood and Affect: Mood is not anxious or depressed.        Speech: Speech normal.        Behavior: Behavior normal. Behavior is cooperative.        Thought Content: Thought content normal.        Judgment: Judgment normal.           Assessment & Plan:

## 2018-04-18 NOTE — Telephone Encounter (Signed)
I spoke with the patient. She states that she saw Dr. Diona Browner this morning and she started her on tramadol for her pain.

## 2018-04-18 NOTE — Patient Instructions (Addendum)
Can use tramadol for pain twice a day. Continue low back stretching. Heat and massage on low back. If not improving , we can consider cymbalta for chronic pain.

## 2018-04-18 NOTE — Assessment & Plan Note (Signed)
Refer back to [facility] for steroid injection in knee. Tramadol for pain.

## 2018-04-18 NOTE — Assessment & Plan Note (Addendum)
Pt refuses ESI. Can use tramadol for pain twice a day. Continue low back stretching. Heat and massage on low back. If not improving , we can consider cymbalta for chronic pain.

## 2018-04-29 ENCOUNTER — Ambulatory Visit (INDEPENDENT_AMBULATORY_CARE_PROVIDER_SITE_OTHER): Payer: Medicare Other | Admitting: Family Medicine

## 2018-04-29 ENCOUNTER — Ambulatory Visit (INDEPENDENT_AMBULATORY_CARE_PROVIDER_SITE_OTHER): Payer: Medicare Other

## 2018-04-29 ENCOUNTER — Encounter: Payer: Self-pay | Admitting: Family Medicine

## 2018-04-29 VITALS — BP 122/76 | HR 48 | Temp 98.6°F | Ht 63.5 in | Wt 175.0 lb

## 2018-04-29 DIAGNOSIS — Z0000 Encounter for general adult medical examination without abnormal findings: Secondary | ICD-10-CM | POA: Diagnosis not present

## 2018-04-29 DIAGNOSIS — Z7901 Long term (current) use of anticoagulants: Secondary | ICD-10-CM | POA: Diagnosis not present

## 2018-04-29 DIAGNOSIS — E118 Type 2 diabetes mellitus with unspecified complications: Secondary | ICD-10-CM

## 2018-04-29 DIAGNOSIS — J4540 Moderate persistent asthma, uncomplicated: Secondary | ICD-10-CM | POA: Diagnosis not present

## 2018-04-29 DIAGNOSIS — E785 Hyperlipidemia, unspecified: Secondary | ICD-10-CM

## 2018-04-29 DIAGNOSIS — I4821 Permanent atrial fibrillation: Secondary | ICD-10-CM

## 2018-04-29 DIAGNOSIS — I10 Essential (primary) hypertension: Secondary | ICD-10-CM

## 2018-04-29 DIAGNOSIS — F419 Anxiety disorder, unspecified: Secondary | ICD-10-CM

## 2018-04-29 DIAGNOSIS — E559 Vitamin D deficiency, unspecified: Secondary | ICD-10-CM

## 2018-04-29 DIAGNOSIS — I429 Cardiomyopathy, unspecified: Secondary | ICD-10-CM

## 2018-04-29 DIAGNOSIS — M5441 Lumbago with sciatica, right side: Secondary | ICD-10-CM | POA: Diagnosis not present

## 2018-04-29 DIAGNOSIS — I509 Heart failure, unspecified: Secondary | ICD-10-CM

## 2018-04-29 LAB — LIPID PANEL
Cholesterol: 200 mg/dL (ref 0–200)
HDL: 40 mg/dL (ref >39.00)
LDL Cholesterol: 136 mg/dL — ABNORMAL HIGH (ref 0–99)
NONHDL: 160.26
TRIGLYCERIDES: 121 mg/dL (ref 0.0–149.0)
Total CHOL/HDL Ratio: 5
VLDL: 24.2 mg/dL (ref 0.0–40.0)

## 2018-04-29 LAB — POCT INR: INR: 1.6 — AB (ref 2.0–3.0)

## 2018-04-29 LAB — VITAMIN D 25 HYDROXY (VIT D DEFICIENCY, FRACTURES): VITD: 28.59 ng/mL — ABNORMAL LOW (ref 30.00–100.00)

## 2018-04-29 LAB — HEMOGLOBIN A1C: Hgb A1c MFr Bld: 5.9 % (ref 4.6–6.5)

## 2018-04-29 LAB — HM DIABETES FOOT EXAM

## 2018-04-29 MED ORDER — FLUTICASONE PROPIONATE 50 MCG/ACT NA SUSP: 2.0000 | Freq: Every day | NASAL | 3 refills | Status: DC

## 2018-04-29 MED ORDER — LOSARTAN POTASSIUM 100 MG PO TABS: 100.0000 mg | ORAL_TABLET | Freq: Every day | ORAL | 3 refills | Status: DC

## 2018-04-29 MED ORDER — OMEPRAZOLE 20 MG PO CPDR: 20.0000 mg | DELAYED_RELEASE_CAPSULE | Freq: Two times a day (BID) | ORAL | 3 refills | Status: DC

## 2018-04-29 MED ORDER — HYDRALAZINE HCL 50 MG PO TABS: 50.0000 mg | ORAL_TABLET | Freq: Three times a day (TID) | ORAL | 3 refills | Status: DC

## 2018-04-29 MED ORDER — HYDROCHLOROTHIAZIDE 25 MG PO TABS: 25.0000 mg | ORAL_TABLET | Freq: Every day | ORAL | 3 refills | Status: DC

## 2018-04-29 MED ORDER — ATORVASTATIN CALCIUM 40 MG PO TABS: 80.0000 mg | ORAL_TABLET | Freq: Every day | ORAL | 3 refills | Status: DC

## 2018-04-29 MED ORDER — DIGOXIN 125 MCG PO TABS: 125.0000 ug | ORAL_TABLET | Freq: Every day | ORAL | 3 refills | Status: DC

## 2018-04-29 MED ORDER — FUROSEMIDE 20 MG PO TABS: 20.0000 mg | ORAL_TABLET | Freq: Every day | ORAL | 3 refills | Status: DC

## 2018-04-29 MED ORDER — FLUTICASONE-SALMETEROL 250-50 MCG/DOSE IN AEPB: 1.0000 | INHALATION_SPRAY | Freq: Two times a day (BID) | RESPIRATORY_TRACT | 3 refills | Status: DC

## 2018-04-29 MED ORDER — WARFARIN SODIUM 5 MG PO TABS: ORAL_TABLET | ORAL | 3 refills | Status: DC

## 2018-04-29 MED ORDER — CLONIDINE HCL 0.1 MG PO TABS: 0.0500 mg | ORAL_TABLET | Freq: Two times a day (BID) | ORAL | 3 refills | Status: DC

## 2018-04-29 MED ORDER — METOPROLOL SUCCINATE ER 25 MG PO TB24: 25.0000 mg | ORAL_TABLET | Freq: Every day | ORAL | 3 refills | Status: DC

## 2018-04-29 MED ORDER — MONTELUKAST SODIUM 10 MG PO TABS: 10.0000 mg | ORAL_TABLET | Freq: Every day | ORAL | 3 refills | Status: DC

## 2018-04-29 MED ORDER — FEXOFENADINE HCL 180 MG PO TABS: 180.0000 mg | ORAL_TABLET | Freq: Every day | ORAL | 3 refills | Status: DC

## 2018-04-29 MED ORDER — ACCU-CHEK FASTCLIX LANCETS MISC: 3 refills | Status: AC

## 2018-04-29 NOTE — Assessment & Plan Note (Signed)
Eulvolemic on lasix.

## 2018-04-29 NOTE — Patient Instructions (Addendum)
Okay to take 1/2 tramadol 2 times daily. Call to set up follow up with Dr. Rockey Situ given in crease in episodes of heart racing on lower dose of metoprolol.  Call to schedule bone density with mammogram in 9.2019.

## 2018-04-29 NOTE — Progress Notes (Signed)
Subjective:   Angela Ali is a 85 y.o. female who presents for Medicare Annual (Subsequent) preventive examination.  Review of Systems:  N/A Cardiac Risk Factors include: advanced age (>58mn, >>82women);diabetes mellitus;dyslipidemia;obesity (BMI >30kg/m2)     Objective:     Vitals: BP 122/76 (BP Location: Right Arm, Patient Position: Sitting, Cuff Size: Normal)   Pulse (!) 48   Temp 98.6 F (37 C) (Oral)   Ht 5' 3.5" (1.613 m) Comment: shoes  Wt 175 lb (79.4 kg)   SpO2 99%   BMI 30.51 kg/m   Body mass index is 30.51 kg/m.  Advanced Directives 03/03/2017 02/22/2014 01/08/2012  Does Patient Have a Medical Advance Directive? No No Patient does not have advance directive  Would patient like information on creating a medical advance directive? Yes (MAU/Ambulatory/Procedural Areas - Information given) - -  Pre-existing out of facility DNR order (yellow form or pink MOST form) - - No    Tobacco Social History   Tobacco Use  Smoking Status Never Smoker  Smokeless Tobacco Never Used     Counseling given: No   Clinical Intake:  Pre-visit preparation completed: Yes  Pain Score: 5      Nutritional Status: BMI > 30  Obese Nutritional Risks: None Diabetes: Yes CBG done?: No Did pt. bring in CBG monitor from home?: No  How often do you need to have someone help you when you read instructions, pamphlets, or other written materials from your doctor or pharmacy?: 1 - Never What is the last grade level you completed in school?: 12th grade  Interpreter Needed?: No  Comments: pt and daughter live together Information entered by :: LPinson, LPN  Past Medical History:  Diagnosis Date  . Allergic rhinitis, cause unspecified   . Atrial fibrillation ([facility]   . Atrial flutter ([facility]   . Bacterial pneumonia, unspecified   . Colonic polyp   . Congestive heart failure, unspecified   . Diabetes mellitus without complication (HCC)    borderline-checks sugars but no meds  .  Diaphragmatic hernia without mention of obstruction or gangrene   . Displacement of lumbar intervertebral disc without myelopathy   . Diverticulosis of colon (without mention of hemorrhage)   . Edema   . Esophageal reflux   . Long term (current) use of anticoagulants   . Osteoarthrosis, unspecified whether generalized or localized, unspecified site   . Other abnormal glucose   . Other and unspecified hyperlipidemia   . Other malaise and fatigue   . Overweight(278.02)   . Prediabetes   . Secondary cardiomyopathy, unspecified   . Splenic mass 08/21/2011  . Stricture and stenosis of esophagus   . Thigh abscess   . Unspecified essential hypertension    Past Surgical History:  Procedure Laterality Date  . ABDOMINAL HYSTERECTOMY  1971   partial  . ANKLE SURGERY Left 2001  . CHOLECYSTECTOMY  2004  . COLONOSCOPY    . ESOPHAGEAL DILATION  01/2007  . POLYPECTOMY    . spleen removed     2005 or 2006 in [place]   Family History  Problem Relation Age of Onset  . Emphysema Father 846 . Coronary artery disease Mother 546 . Hypertension Mother 52 . Ovarian cancer Daughter   . Bone cancer Son        bone marrow ca  . Hyperlipidemia Sister        # 1  . Hyperlipidemia Sister        # 2  .  Diabetes Daughter        # 1  . Hypertension Daughter   . Diabetes Daughter        # 2   . Hypertension Daughter   . Colon cancer Neg Hx    Social History   Socioeconomic History  . Marital status: Widowed    Spouse name: Not on file  . Number of children: 8  . Years of education: Not on file  . Highest education level: Not on file  Occupational History  . Occupation: Retired Product manager: RETIRED    Comment: retired Pharmacist, hospital.  Used to live in [place], [place], now in [place] with her family  Social Needs  . Financial resource strain: Not on file  . Food insecurity:    Worry: Not on file    Inability: Not on file  . Transportation needs:    Medical: Not on file     Non-medical: Not on file  Tobacco Use  . Smoking status: Never Smoker  . Smokeless tobacco: Never Used  Substance and Sexual Activity  . Alcohol use: No    Alcohol/week: 0.0 standard drinks  . Drug use: No  . Sexual activity: Never  Lifestyle  . Physical activity:    Days per week: Not on file    Minutes per session: Not on file  . Stress: Not on file  Relationships  . Social connections:    Talks on phone: Not on file    Gets together: Not on file    Attends religious service: Not on file    Active member of club or organization: Not on file    Attends meetings of clubs or organizations: Not on file    Relationship status: Not on file  Other Topics Concern  . Not on file  Social History Narrative   Lost spouse to cancer in 07/2006   5 children   Does not get regular exercise    Outpatient Encounter Medications as of 04/29/2018  Medication Sig  . ACCU-CHEK FASTCLIX LANCETS MISC Use to check blood sugar 2 times a day.  Dx: E11.8  . acetaminophen (TYLENOL ARTHRITIS PAIN) 650 MG CR tablet Take 650 mg by mouth daily.  . Ascorbic Acid (VITAMIN C PO) Take 1 tablet by mouth daily.  . benzonatate (TESSALON) 100 MG capsule TAKE 2 CAPSULES BY MOUTH TWICE DAILY AS NEEDED FOR COUGH  . Blood Glucose Monitoring Suppl (ACCU-CHEK GUIDE) w/Device KIT 1 each by Does not apply route 2 (two) times daily. Dx: E11.8  . Blood Pressure KIT Use daily as needed to check blood pressure.  . Cholecalciferol (VITAMIN D-3 PO) Take 1 tablet by mouth daily.  . cloNIDine (CATAPRES) 0.1 MG tablet Take 0.5 tablets (0.05 mg total) by mouth 2 (two) times daily.  . digoxin (LANOXIN) 0.125 MG tablet Take 1 tablet (125 mcg total) by mouth daily.  . fexofenadine (ALLEGRA) 180 MG tablet Take 1 tablet (180 mg total) by mouth daily.  . fluticasone (FLONASE) 50 MCG/ACT nasal spray Place 2 sprays into both nostrils daily.  . Fluticasone-Salmeterol (ADVAIR) 250-50 MCG/DOSE AEPB Inhale 1 puff into the lungs 2 (two) times  daily.  . furosemide (LASIX) 20 MG tablet TAKE 1 TABLET EVERY DAY  . glucose blood (ACCU-CHEK GUIDE) test strip Use to check blood sugar 2 times a day.  Dx: E11.8  . hydrALAZINE (APRESOLINE) 50 MG tablet Take 1 tablet (50 mg total) by mouth 3 (three) times daily.  . hydrochlorothiazide (HYDRODIURIL) 25 MG tablet  Take 1 tablet (25 mg total) by mouth daily.  Marland Kitchen latanoprost (XALATAN) 0.005 % ophthalmic solution   . losartan (COZAAR) 100 MG tablet Take 1 tablet (100 mg total) by mouth daily.  . metoprolol succinate (TOPROL-XL) 25 MG 24 hr tablet Take 1 tablet (25 mg total) by mouth daily. Take with or immediately following a meal.  . montelukast (SINGULAIR) 10 MG tablet Take 1 tablet (10 mg total) by mouth at bedtime.  Marland Kitchen omeprazole (PRILOSEC) 20 MG capsule TAKE 1 CAPSULE TWICE DAILY BEFORE A MEAL  . traMADol (ULTRAM) 50 MG tablet Take 0.5-1 tablets (25-50 mg total) by mouth every 12 (twelve) hours as needed for moderate pain.  Marland Kitchen warfarin (COUMADIN) 5 MG tablet TAKE AS DIRECTED BY ANTICOAGULATION  CLINIC  . [DISCONTINUED] Azelastine HCl 137 MCG/SPRAY SOLN Place 1-2 sprays into the nose 2 (two) times daily.  Marland Kitchen atorvastatin (LIPITOR) 40 MG tablet Take 2 tablets (80 mg total) by mouth daily at 6 PM.  . [DISCONTINUED] esomeprazole (NEXIUM) 40 MG capsule Take 40 mg by mouth daily.    . [DISCONTINUED] predniSONE (DELTASONE) 20 MG tablet 2 tabs po for 4 days, then 1 tab po for 4 days  . [DISCONTINUED] timolol (TIMOPTIC) 0.5 % ophthalmic solution    No facility-administered encounter medications on file as of 04/29/2018.     Activities of Daily Living In your present state of health, do you have any difficulty performing the following activities: 04/29/2018  Hearing? N  Vision? N  Difficulty concentrating or making decisions? N  Walking or climbing stairs? Y  Dressing or bathing? N  Doing errands, shopping? N  Preparing Food and eating ? N  Using the Toilet? N  In the past six months, have you  accidently leaked urine? N  Do you have problems with loss of bowel control? N  Managing your Medications? N  Managing your Finances? N  Housekeeping or managing your Housekeeping? N  Some recent data might be hidden    Patient Care Team: Jinny Sanders, MD as PCP - General Dwain Sarna, [place] as Referring Physician (Optometry) Rockey Situ, Kathlene November, MD as Consulting Physician (Cardiology) Inocencio Homes, DPM as Consulting Physician (Podiatry)    Assessment:   This is a routine wellness examination for Angela Ali.   Hearing Screening   '125Hz'$  '250Hz'$  '500Hz'$  '1000Hz'$  '2000Hz'$  '3000Hz'$  '4000Hz'$  '6000Hz'$  '8000Hz'$   Right ear:   40 40 40  40    Left ear:   40 40 40  40    Vision Screening Comments: Vision exam in 2019 with Dr. Ellin Mayhew  Exercise Activities and Dietary recommendations Current Exercise Habits: The patient does not participate in regular exercise at present, Exercise limited by: orthopedic condition(s)  Goals    . Increase water intake     Starting 04/29/2018, I will continue to drink at least 5-6 glasses of water daily.        Fall Risk Fall Risk  04/29/2018 03/03/2017 11/20/2016 10/10/2015 09/07/2014  Falls in the past year? 0 Yes No No No  Comment - pt injured right side after falling in kitchen - - -  Number falls in past yr: - 1 - - -  Injury with Fall? - Yes - - -   Depression Screen PHQ 2/9 Scores 04/29/2018 03/03/2017 11/20/2016 10/10/2015  PHQ - 2 Score 0 0 1 0  PHQ- 9 Score 0 2 - -     Cognitive Function MMSE - Mini Mental State Exam 04/29/2018 03/03/2017  Orientation to time 5 5  Orientation to Place 5 5  Registration 3 3  Attention/ Calculation 0 0  Recall 3 3  Language- name 2 objects 0 0  Language- repeat 1 1  Language- follow 3 step command 3 3  Language- read & follow direction 0 0  Write a sentence 0 0  Copy design 0 0  Total score 20 20    Immunization History  Administered Date(s) Administered  . Influenza Split 02/27/2011, 02/18/2012  . Influenza Whole 01/12/2008,  01/31/2009, 03/05/2010  . Influenza,inj,Quad PF,6+ Mos 02/03/2013, 01/29/2014, 01/18/2015, 04/08/2017, 02/22/2018  . Pneumococcal Conjugate-13 08/31/2013  . Pneumococcal Polysaccharide-23 04/28/2007  . Td 02/26/2009    Screening Tests Health Maintenance  Topic Date Due  . FOOT EXAM  04/29/2018 (Originally 04/08/2018)  . HEMOGLOBIN A1C  06/09/2018  . OPHTHALMOLOGY EXAM  10/26/2018  . MAMMOGRAM  01/08/2019  . TETANUS/TDAP  02/27/2019  . INFLUENZA VACCINE  Completed  . DEXA SCAN  Completed  . PNA vac Low Risk Adult  Completed      Plan:     I have personally reviewed, addressed, and noted the following in the patient's chart:  A. Medical and social history B. Use of alcohol, tobacco or illicit drugs  C. Current medications and supplements D. Functional ability and status E.  Nutritional status F.  Physical activity G. Advance directives H. List of other physicians I.  Hospitalizations, surgeries, and ER visits in previous 12 months J.  [place] to include hearing, vision, cognitive, depression L. Referrals and appointments - none  In addition, I have reviewed and discussed with patient certain preventive protocols, quality metrics, and best practice recommendations. A written personalized care plan for preventive services as well as general preventive health recommendations were provided to patient.  See attached scanned questionnaire for additional information.   Signed,   Lindell Noe, MHA, BS, LPN Health Coach

## 2018-04-29 NOTE — Progress Notes (Signed)
PCP notes:   Health maintenance:  Foot exam - per HM, pt needs one. Needs podiatry referral  Abnormal screenings:   None  Patient concerns:   A. Increased anxiety with heart palpitations  B. Back and bilateral leg pain  Nurse concerns:  None

## 2018-04-29 NOTE — Assessment & Plan Note (Signed)
Stable

## 2018-04-29 NOTE — Assessment & Plan Note (Signed)
Improved control on tramadol... pt reminded she can use it 2 times daily.

## 2018-04-29 NOTE — Progress Notes (Signed)
I reviewed health advisor's note, was available for consultation, and agree with documentation and plan.  

## 2018-04-29 NOTE — Assessment & Plan Note (Signed)
Does not meet requirements for dx of GAD or panic attacks.  Hard to tell if palpitation from afib or anxiety, or if anxious feeling because heart rate up.  recommended stress reduction, relaxation techniques and consideration of moving to [place] stressful environment with daughter.

## 2018-04-29 NOTE — Patient Instructions (Signed)
INR today: 1.6  *Patient has had extra greens this week during the holidays.   Take 1 (5mg ) pill today 04/29/18 and 1.5 pills (7.5mg ) tomorrow 04/30/18 then  Continue to take 2.5mg  daily EXCEPT for 5mg  on Tuesday and Saturday and recheck in 2-3 weeks.  She is aware of risks associated with a subtherapeutic level and will go to the ER if any concerns develop.    Patient doing well without any changes to health or medications.  She has had increased afib and will be following up with cardiologist in the upcoming weeks.

## 2018-04-29 NOTE — Progress Notes (Signed)
PCP notes:   Health maintenance:  Foot exam - PCP follow-up needed A1C - completed  Abnormal screenings:   None  Patient concerns:   Increased anxiety with heart palpitations  Back and bilateral leg pain  Nurse concerns:  None  Next PCP appt:   04/29/2018 @ 1430

## 2018-04-29 NOTE — Assessment & Plan Note (Signed)
Due for re-val , labs pending.

## 2018-04-29 NOTE — Assessment & Plan Note (Signed)
Labs pending. On statin.

## 2018-04-29 NOTE — Progress Notes (Signed)
Subjective:    Patient ID: Angela Ali, female    DOB: 1928/12/18, 85 y.o.   MRN: [id number]  HPI  The patient presents for annual medicare wellness, complete physical and review of chronic health problems. He/She also has the following acute concerns today:  Patient concerns:   A. Increased anxiety with heart palpitations  B. Back and bilateral leg pain  The patient saw Candis Musa, LPN for medicare wellness. Note reviewed in detail and important notes copied below.  Health maintenance:  Foot exam - per HM, pt needs one. Needs podiatry referral  Abnormal screenings:   None  04/29/18  She has been having more palpitations since decreasing metoprolol in August per Dr. Rockey Situ to 25 mg daily.  Heart racing at night when laying down,  In last few months,  On christmas day BP sys 200 and you felt very anxious , palpitations and weak. EMS called, offered ER visit and she refused.  Since then palpitations at night but no further high  BPS. BP Readings from Last 3 Encounters:  04/29/18 122/76  04/29/18 122/76  04/18/18 128/76     She has been feeling depressed and anxious. Poor sleep at night.  She feels safe and happier with daughter in [place]... sheis thinking of moving closer. Does not have anything to do here. [place] Resides family support here. PHQ9: 5/27 GAD7: 4  Moderate persistent asthma: stable control on Advair   Afib and cardiomyopathy: followed by cardiology. On coumadin anticoagulation.  Chronic low back pain: now on tramadol for pain... recommended refer for ESI but pt refused.  HTN  Well controlled in office today on current regimen... losartan. HCTZ, lasix,   Elevated Cholesterol: Labs pending  on no atorvastatin 80 mg daily. Lab Results  Component Value Date   CHOL 201 (H) 03/03/2017   HDL 41.60 03/03/2017   LDLCALC 140 (H) 03/03/2017   TRIG 97.0 03/03/2017   CHOLHDL 5 03/03/2017  Using medications without problems: Muscle aches:  Diet  compliance: Exercise: Other complaints:  Social History /Family History/Past Medical History reviewed in detail and updated in EMR if needed. Blood pressure 122/76, pulse (!) 48, temperature 98.6 F (37 C), temperature source Oral, height 5' 3.5" (1.613 m), weight 175 lb (79.4 kg), SpO2 99 %.  Review of Systems  Constitutional: Positive for fatigue. Negative for fever.  HENT: Negative for congestion.   Eyes: Negative for pain.  Respiratory: Negative for cough and shortness of breath.   Cardiovascular: Positive for palpitations. Negative for chest pain and leg swelling.  Gastrointestinal: Negative for abdominal pain.  Genitourinary: Negative for dysuria and vaginal bleeding.  Musculoskeletal: Positive for back pain.  Neurological: Negative for syncope, light-headedness and headaches.  Psychiatric/Behavioral: Positive for dysphoric mood.       Objective:   Physical Exam Constitutional:      General: She is not in acute distress.    Appearance: Normal appearance. She is well-developed. She is obese. She is not ill-appearing or toxic-appearing.     Comments: Elderly female  HENT:     Head: Normocephalic.     Right Ear: Hearing, tympanic membrane, ear canal and external ear normal.     Left Ear: Hearing, tympanic membrane, ear canal and external ear normal.     Nose: Nose normal.  Eyes:     General: Lids are normal. Lids are everted, no foreign bodies appreciated.     Conjunctiva/sclera: Conjunctivae normal.     Pupils: Pupils are equal, round, and reactive to light.  Neck:     Musculoskeletal: Normal range of motion and neck supple.     Thyroid: No thyroid mass or thyromegaly.     Vascular: No carotid bruit.     Trachea: Trachea normal.  Cardiovascular:     Rate and Rhythm: Bradycardia present. Rhythm irregularly irregular.     Heart sounds: Normal heart sounds, S1 normal and S2 normal. No murmur. No gallop.   Pulmonary:     Effort: Pulmonary effort is normal. No respiratory  distress.     Breath sounds: Normal breath sounds. No wheezing, rhonchi or rales.  Abdominal:     General: Bowel sounds are normal. There is no distension or abdominal bruit.     Palpations: Abdomen is soft. There is no fluid wave or mass.     Tenderness: There is no abdominal tenderness. There is no guarding or rebound.     Hernia: No hernia is present.  Musculoskeletal:     Right lower leg: No edema.     Left lower leg: No edema.  Lymphadenopathy:     Cervical: No cervical adenopathy.  Skin:    General: Skin is warm and dry.     Findings: No rash.  Neurological:     Mental Status: She is alert.     Cranial Nerves: No cranial nerve deficit.     Sensory: No sensory deficit.  Psychiatric:        Mood and Affect: Mood is not anxious or depressed.        Speech: Speech normal.        Behavior: Behavior normal. Behavior is cooperative.        Judgment: Judgment normal.       Diabetic foot exam: Normal inspection No skin breakdown No calluses  Normal DP pulses Normal sensation to light touch and monofilament Nails normal     Assessment & Plan:  The patient's preventative maintenance and recommended screening tests for an annual wellness exam were reviewed in full today. Brought up to date unless services declined.  Counselled on the importance of diet, exercise, and its role in overall health and mortality. The patient's FH and SH was reviewed, including their home life, tobacco status, and drug and alcohol status.   mammog 12/2017  DEXA due in 12/2017  Vaccines uptodate

## 2018-04-29 NOTE — Patient Instructions (Signed)
Angela Ali , Thank you for taking time to come for your Medicare Wellness Visit. I appreciate your ongoing commitment to your health goals. Please review the following plan we discussed and let me know if I can assist you in the future.   These are the goals we discussed: Goals    . Increase water intake     Starting 04/29/2018, I will continue to drink at least 5-6 glasses of water daily.        This is a list of the screening recommended for you and due dates:  Health Maintenance  Topic Date Due  . Complete foot exam   04/29/2018*  . Hemoglobin A1C  06/09/2018  . Eye exam for diabetics  10/26/2018  . Mammogram  01/08/2019  . Tetanus Vaccine  02/27/2019  . Flu Shot  Completed  . DEXA scan (bone density measurement)  Completed  . Pneumonia vaccines  Completed  *Topic was postponed. The date shown is not the original due date.   Preventive Care for Adults  A healthy lifestyle and preventive care can promote health and wellness. Preventive health guidelines for adults include the following key practices.  . A routine yearly physical is a good way to check with your health care provider about your health and preventive screening. It is a chance to share any concerns and updates on your health and to receive a thorough exam.  . Visit your dentist for a routine exam and preventive care every 6 months. Brush your teeth twice a day and floss once a day. Good oral hygiene prevents tooth decay and gum disease.  . The frequency of eye exams is based on your age, health, family medical history, use  of contact lenses, and other factors. Follow your health care provider's recommendations for frequency of eye exams.  . Eat a healthy diet. Foods like vegetables, fruits, whole grains, low-fat dairy products, and lean protein foods contain the nutrients you need without too many calories. Decrease your intake of foods high in solid fats, added sugars, and salt. Eat the right amount of calories for  you. Get information about a proper diet from your health care provider, if necessary.  . Regular physical exercise is one of the most important things you can do for your health. Most adults should get at least 150 minutes of moderate-intensity exercise (any activity that increases your heart rate and causes you to sweat) each week. In addition, most adults need muscle-strengthening exercises on 2 or more days a week.  Silver Sneakers may be a benefit available to you. To determine eligibility, you may visit the website: www.silversneakers.com or contact program at 479-520-1533 Mon-Fri between 8AM-8PM.   . Maintain a healthy weight. The body mass index (BMI) is a screening tool to identify possible weight problems. It provides an estimate of body fat based on height and weight. Your health care provider can find your BMI and can help you achieve or maintain a healthy weight.   For adults 20 years and older: ? A BMI below 18.5 is considered underweight. ? A BMI of 18.5 to 24.9 is normal. ? A BMI of 25 to 29.9 is considered overweight. ? A BMI of 30 and above is considered obese.   . Maintain normal blood lipids and cholesterol levels by exercising and minimizing your intake of saturated fat. Eat a balanced diet with plenty of fruit and vegetables. Blood tests for lipids and cholesterol should begin at age 54 and be repeated every 5 years.  If your lipid or cholesterol levels are high, you are over 50, or you are at high risk for heart disease, you may need your cholesterol levels checked more frequently. Ongoing high lipid and cholesterol levels should be treated with medicines if diet and exercise are not working.  . If you smoke, find out from your health care provider how to quit. If you do not use tobacco, please do not start.  . If you choose to drink alcohol, please do not consume more than 2 drinks per day. One drink is considered to be 12 ounces (355 mL) of beer, 5 ounces (148 mL) of  wine, or 1.5 ounces (44 mL) of liquor.  . If you are 12-62 years old, ask your health care provider if you should take aspirin to prevent strokes.  . Use sunscreen. Apply sunscreen liberally and repeatedly throughout the day. You should seek shade when your shadow is shorter than you. Protect yourself by wearing long sleeves, pants, a wide-brimmed hat, and sunglasses year round, whenever you are outdoors.  . Once a month, do a whole body skin exam, using a mirror to look at the skin on your back. Tell your health care provider of new moles, moles that have irregular borders, moles that are larger than a pencil eraser, or moles that have changed in shape or color.

## 2018-04-29 NOTE — Assessment & Plan Note (Signed)
Per pt report.. has bene having spells at night of fast HR... rate controlled but bradycardiac in office today. WIll refer back to Dr. Rockey Situ for further recommendations.

## 2018-04-29 NOTE — Assessment & Plan Note (Signed)
Followed by cardiology 

## 2018-05-03 ENCOUNTER — Other Ambulatory Visit: Payer: Self-pay | Admitting: *Deleted

## 2018-05-05 ENCOUNTER — Ambulatory Visit: Payer: Medicare PPO

## 2018-05-12 ENCOUNTER — Ambulatory Visit: Payer: Medicare Other

## 2018-05-12 ENCOUNTER — Ambulatory Visit (INDEPENDENT_AMBULATORY_CARE_PROVIDER_SITE_OTHER): Payer: Medicare Other | Admitting: General Practice

## 2018-05-12 DIAGNOSIS — Z7901 Long term (current) use of anticoagulants: Secondary | ICD-10-CM | POA: Diagnosis not present

## 2018-05-12 LAB — POCT INR: INR: 2.5 (ref 2.0–3.0)

## 2018-05-12 NOTE — Patient Instructions (Addendum)
Pre visit review using our clinic review tool, if applicable. No additional management support is needed unless otherwise documented below in the visit note.  Continue to take 2.5mg  daily EXCEPT for 5mg  on Tuesday and Saturday and recheck in 2-3 weeks.

## 2018-05-16 ENCOUNTER — Ambulatory Visit: Payer: Medicare Other | Admitting: Podiatrist

## 2018-05-16 ENCOUNTER — Encounter: Payer: Self-pay | Admitting: Podiatrist

## 2018-05-16 VITALS — BP 148/91 | HR 64

## 2018-05-16 DIAGNOSIS — B351 Tinea unguium: Secondary | ICD-10-CM | POA: Diagnosis not present

## 2018-05-16 DIAGNOSIS — E118 Type 2 diabetes mellitus with unspecified complications: Secondary | ICD-10-CM | POA: Diagnosis not present

## 2018-05-16 DIAGNOSIS — M79609 Pain in unspecified limb: Secondary | ICD-10-CM

## 2018-05-16 DIAGNOSIS — R601 Generalized edema: Secondary | ICD-10-CM

## 2018-05-16 NOTE — Patient Instructions (Addendum)
Go to any drug store and purchase some compressive socks - they are usually 5-37mmHg and are more tolerable to start wearing.  When you are comfortable in these we can then move you up to the prescription strength stockings.    Edema  Edema is when you have too much fluid in your body or under your skin. Edema may make your legs, feet, and ankles swell up. Swelling is also common in looser tissues, like around your eyes. This is a common condition. It gets more common as you get older. There are many possible causes of edema. Eating too much salt (sodium) and being on your feet or sitting for a long time can cause edema in your legs, feet, and ankles. Hot weather may make edema worse. Edema is usually painless. Your skin may look swollen or shiny. Follow these instructions at home:  Keep the swollen body part raised (elevated) above the level of your heart when you are sitting or lying down.  Do not sit still or stand for a long time.  Do not wear tight clothes. Do not wear garters on your upper legs.  Exercise your legs. This can help the swelling go down.  Wear elastic bandages or support stockings as told by your doctor.  Eat a low-salt (low-sodium) diet to reduce fluid as told by your doctor.  Depending on the cause of your swelling, you may need to limit how much fluid you drink (fluid restriction).  Take over-the-counter and prescription medicines only as told by your doctor. Contact a doctor if:  Treatment is not working.  You have heart, liver, or kidney disease and have symptoms of edema.  You have sudden and unexplained weight gain. Get help right away if:  You have shortness of breath or chest pain.  You cannot breathe when you lie down.  You have pain, redness, or warmth in the swollen areas.  You have heart, liver, or kidney disease and get edema all of a sudden.  You have a fever and your symptoms get worse all of a sudden. Summary  Edema is when you have too  much fluid in your body or under your skin.  Edema may make your legs, feet, and ankles swell up. Swelling is also common in looser tissues, like around your eyes.  Raise (elevate) the swollen body part above the level of your heart when you are sitting or lying down.  Follow your doctor's instructions about diet and how much fluid you can drink (fluid restriction). This information is not intended to replace advice given to you by your health care provider. Make sure you discuss any questions you have with your health care provider. Document Released: 09/30/2007 Document Revised: 05/01/2016 Document Reviewed: 05/01/2016 Elsevier Interactive Patient Education  2019 [employer].

## 2018-05-19 ENCOUNTER — Ambulatory Visit: Payer: Medicare PPO | Admitting: Podiatry

## 2018-05-21 ENCOUNTER — Encounter: Payer: Self-pay | Admitting: Podiatrist

## 2018-05-21 NOTE — Progress Notes (Signed)
  Subjective:     Patient ID: Angela Ali, female   DOB: [birth year], 85 y.o.   MRN: [id number]  HPI: Patient presents today with her daughter stating she has bilateral foot and ankle swelling and is here for diabetic foot exam P.  Patient also states that her toenails are too long and thick for her to care for and due to her diabetes and Coumadin she was recommended not to trim them herself.  The patient also relates that they are painful when she wear shoes.  Review of Systems  Cardiovascular: Positive for leg swelling.  All other systems reviewed and are negative.      Objective:   Physical Exam Neurological sensation intact via Semmes Weinstein monofilament at 5/5 sites bilateral.  Light touch and vibratory sensation also intact bilateral. Vascular status intact with palpable DP pulses 2/4 bilateral.  PT pulse unable to be palpated due to swelling.  Diffuse generalized swelling noted bilateral feet and ankles. Musculoskeletal examination reveals rectus foot type.  Muscle strength and stability is normal. Dermatological exam reveals dystrophic, thickened, discolored, elongated and painful nails x10.  Multiple signs of mycotic nail deformity noted.    Assessment:     Diabetic female on Coumadin with mycotic toenails x10, edema bilateral    Plan:     Careful debridement of the toenails carried out today via mechanical and manual means without complication.  Discussed the importance of compression stockings and recommended she start wearing them daily.  She will return in 3 months for continued care and we will check her progress at that time.

## 2018-05-23 ENCOUNTER — Telehealth: Payer: Self-pay | Admitting: *Deleted

## 2018-05-23 MED ORDER — GLUCOSE BLOOD VI STRP: ORAL_STRIP | 1 refills | Status: DC

## 2018-05-23 NOTE — Telephone Encounter (Signed)
Patient called requesting that a refill on her Hydrochlorothiazide be sent to her mail order pharmacy. Advised patient that medication is not on her medication list and confirmed with patient that she is taking Furosamide. Patient stated that she has been taking both Furosemide and HCTZ..  Patient also requested a refill on her test strips. Refill for test strips sent to the pharmacy.

## 2018-05-24 ENCOUNTER — Telehealth: Payer: Self-pay | Admitting: *Deleted

## 2018-05-24 NOTE — Telephone Encounter (Signed)
She should not be taking HCTZ.Marland Kitchen Use furosemide instead.

## 2018-05-24 NOTE — Telephone Encounter (Signed)
[name] notified as instructed by telephone.  Patient states understanding.

## 2018-05-24 NOTE — Telephone Encounter (Signed)
Pt states she was seen by Dr. Valentina Lucks last week.

## 2018-05-24 NOTE — Telephone Encounter (Signed)
I called pt and she states she has a hammer toe and wanted to know if there was anything that would cover it to help stop the knot. I told pt we have a reusable silicone sleeve and I would inform Dr. Valentina Lucks I had given her one. Pt states she is bringing her dtr tomorrow for therapy and will pick it up then.

## 2018-06-09 ENCOUNTER — Ambulatory Visit: Payer: Medicare Other

## 2018-06-10 ENCOUNTER — Encounter: Payer: Self-pay | Admitting: Family Medicine

## 2018-06-10 ENCOUNTER — Ambulatory Visit (INDEPENDENT_AMBULATORY_CARE_PROVIDER_SITE_OTHER): Payer: Medicare Other | Admitting: Family Medicine

## 2018-06-10 VITALS — BP 162/84 | HR 82 | Temp 98.7°F | Ht 63.5 in | Wt 181.0 lb

## 2018-06-10 DIAGNOSIS — R140 Abdominal distension (gaseous): Secondary | ICD-10-CM | POA: Insufficient documentation

## 2018-06-10 DIAGNOSIS — I4821 Permanent atrial fibrillation: Secondary | ICD-10-CM | POA: Diagnosis not present

## 2018-06-10 DIAGNOSIS — Z7901 Long term (current) use of anticoagulants: Secondary | ICD-10-CM | POA: Diagnosis not present

## 2018-06-10 DIAGNOSIS — J4540 Moderate persistent asthma, uncomplicated: Secondary | ICD-10-CM

## 2018-06-10 DIAGNOSIS — I10 Essential (primary) hypertension: Secondary | ICD-10-CM

## 2018-06-10 DIAGNOSIS — J0100 Acute maxillary sinusitis, unspecified: Secondary | ICD-10-CM

## 2018-06-10 LAB — POCT INR: INR: 3.3 — AB (ref 2.0–3.0)

## 2018-06-10 MED ORDER — AMOXICILLIN 500 MG PO CAPS: 1000.0000 mg | ORAL_CAPSULE | Freq: Two times a day (BID) | ORAL | 0 refills | Status: DC

## 2018-06-10 NOTE — Assessment & Plan Note (Signed)
Encouraged pt to use Advair she has from her daughter. BID.  Currently no clear flare of asthma..lungs clear on exam.

## 2018-06-10 NOTE — Assessment & Plan Note (Signed)
She feels the extra gas is what is making her feel SOB. Trial of probiotic and gasx.  Avoid onions etc.

## 2018-06-10 NOTE — Patient Instructions (Addendum)
Can try gas x and probiotic (Align) for bloaiting symptoms.  Start amoxicillin for sinus infection.  At least now use Advair for asthma.  Can use mucinex ( plain or DM, no decongestants) twice daily for  Congestion. Follow blood pressure at home over next several day.. call if > 150/90 persistently.

## 2018-06-10 NOTE — Addendum Note (Signed)
Addended by: Eliezer Lofts E on: 06/10/2018 10:32 AM   Modules accepted: Orders

## 2018-06-10 NOTE — Progress Notes (Signed)
Subjective:    Patient ID: Angela Ali, female    DOB: 04-Sep-1928, 85 y.o.   MRN: [id number]  Cough  This is a new problem. The current episode started in the past 7 days. The cough is productive of sputum. Associated symptoms include nasal congestion, postnasal drip, a sore throat and shortness of breath. Pertinent negatives include no chills, fever, myalgias or wheezing. Associated symptoms comments: Sinus pain and pressure  no ear pain  no chest pain. Nothing aggravates the symptoms. Risk factors: nonsmoker. Treatments tried: floanse, singulair. afib, CHF, DM, moderate persistent asthma    Bloating  In upper abdomen that make it feel like she is short of breath.. had a lot of onion prior to this.  Has not taken decongestant. She took all her BP medication  Last night.   Cannot afford Advair.Marland Kitchenbut has some of daughters. BP Readings from Last 3 Encounters:  06/10/18 (!) 183/105  05/16/18 (!) 148/91  04/29/18 122/76   No known flu exposures.  Social History /Family History/Past Medical History reviewed in detail and updated in EMR if needed. Blood pressure (!) 183/105, pulse 82, temperature 98.7 F (37.1 C), temperature source Oral, height 5' 3.5" (1.613 m), weight 181 lb (82.1 kg), SpO2 94 %.   Review of Systems  Constitutional: Negative for chills and fever.  HENT: Positive for postnasal drip and sore throat.   Respiratory: Positive for cough and shortness of breath. Negative for wheezing.   Musculoskeletal: Negative for myalgias.       Objective:   Physical Exam Constitutional:      General: She is not in acute distress.    Appearance: She is well-developed. She is not ill-appearing or toxic-appearing.  HENT:     Head: Normocephalic.     Right Ear: Hearing, tympanic membrane, ear canal and external ear normal. Tympanic membrane is not erythematous, retracted or bulging.     Left Ear: Hearing, tympanic membrane, ear canal and external ear normal. Tympanic membrane is  not erythematous, retracted or bulging.     Nose: Mucosal edema and rhinorrhea present.     Right Sinus: No maxillary sinus tenderness or frontal sinus tenderness.     Left Sinus: No maxillary sinus tenderness or frontal sinus tenderness.     Mouth/Throat:     Pharynx: Uvula midline.  Eyes:     General: Lids are normal. Lids are everted, no foreign bodies appreciated.     Conjunctiva/sclera:     Right eye: Right conjunctiva is injected.     Left eye: Left conjunctiva is injected.     Pupils: Pupils are equal, round, and reactive to light.  Neck:     Musculoskeletal: Normal range of motion and neck supple.     Thyroid: No thyroid mass or thyromegaly.     Vascular: No carotid bruit.     Trachea: Trachea normal.  Cardiovascular:     Rate and Rhythm: Normal rate and regular rhythm.     Pulses: Normal pulses.     Heart sounds: Normal heart sounds, S1 normal and S2 normal. No murmur. No friction rub. No gallop.   Pulmonary:     Effort: Pulmonary effort is normal. No tachypnea or respiratory distress.     Breath sounds: Normal breath sounds. No decreased breath sounds, wheezing, rhonchi or rales.  Skin:    General: Skin is warm and dry.     Findings: No rash.  Neurological:     Mental Status: She is alert.  Psychiatric:  Mood and Affect: Mood is not anxious or depressed.        Speech: Speech normal.        Behavior: Behavior normal. Behavior is cooperative.        Judgment: Judgment normal.           Assessment & Plan:

## 2018-06-10 NOTE — Assessment & Plan Note (Addendum)
Difficult to determine viral vs bacterial origin, but given > 1 week of symptoms, DM , comorbidities  and pt feeling very weak.. will treat with amoxicillin x 10 days.

## 2018-06-10 NOTE — Assessment & Plan Note (Signed)
BP elevated in office today despite compliant with 4 BP meds.  Follow at home. May be due to illness.

## 2018-06-10 NOTE — Addendum Note (Signed)
Addended by: Ellamae Sia on: 06/10/2018 10:45 AM   Modules accepted: Orders

## 2018-06-16 ENCOUNTER — Other Ambulatory Visit: Payer: Self-pay | Admitting: Family Medicine

## 2018-06-16 ENCOUNTER — Ambulatory Visit: Payer: Medicare Other

## 2018-06-16 ENCOUNTER — Ambulatory Visit (INDEPENDENT_AMBULATORY_CARE_PROVIDER_SITE_OTHER): Payer: Medicare Other | Admitting: General Practice

## 2018-06-16 DIAGNOSIS — Z7901 Long term (current) use of anticoagulants: Secondary | ICD-10-CM

## 2018-06-16 LAB — POCT INR: INR: 1.9 — AB (ref 2.0–3.0)

## 2018-06-16 MED ORDER — BENZONATATE 100 MG PO CAPS: ORAL_CAPSULE | ORAL | 0 refills | Status: DC

## 2018-06-16 NOTE — Telephone Encounter (Signed)
Sent!

## 2018-06-16 NOTE — Telephone Encounter (Signed)
Pt is requesting refill for Benxonatate to be sent by [facility] mail service.

## 2018-06-16 NOTE — Patient Instructions (Addendum)
Pre visit review using our clinic review tool, if applicable. No additional management support is needed unless otherwise documented below in the visit note.  Take 1 tablet today (2/20) and then continue to take 2.5mg  daily EXCEPT for 5mg  on Tuesday and Saturday and recheck in 4 weeks.

## 2018-07-11 ENCOUNTER — Telehealth: Payer: Self-pay | Admitting: *Deleted

## 2018-07-11 NOTE — Telephone Encounter (Signed)
Received fax from [facility] requesting PA for Lipitor 40 mg.  PA completed on CoverMyMeds.  Sent for review.  Can take up to 72 hours for decision.

## 2018-07-11 NOTE — Telephone Encounter (Signed)
PA approved through 04/27/2019.  [facility] notified of approval via fax.

## 2018-07-14 ENCOUNTER — Other Ambulatory Visit: Payer: Self-pay

## 2018-07-14 ENCOUNTER — Ambulatory Visit: Payer: Medicare Other

## 2018-07-14 ENCOUNTER — Ambulatory Visit (INDEPENDENT_AMBULATORY_CARE_PROVIDER_SITE_OTHER): Payer: Medicare Other | Admitting: General Practice

## 2018-07-14 DIAGNOSIS — Z7901 Long term (current) use of anticoagulants: Secondary | ICD-10-CM | POA: Diagnosis not present

## 2018-07-14 LAB — POCT INR: INR: 3.6 — AB (ref 2.0–3.0)

## 2018-07-14 NOTE — Patient Instructions (Addendum)
Pre visit review using our clinic review tool, if applicable. No additional management support is needed unless otherwise documented below in the visit note.  Skip coumadin today and tomorrow and then continue to take 2.5mg  daily EXCEPT for 5mg  on Tuesday and Saturday and recheck in 2 weeks.

## 2018-07-18 MED ORDER — BENZONATATE 100 MG PO CAPS: ORAL_CAPSULE | ORAL | 0 refills | Status: DC

## 2018-07-18 NOTE — Telephone Encounter (Signed)
Pt request refill for benzonatate to [facility] [address]. Pt said her ins does not cover and the [facility] did not send out 06/16/18 benzonatate rx. Pt request cb when refilled. Pt said she also needs to talk with Butch Penny CMA about some other things as well.

## 2018-07-18 NOTE — Addendum Note (Signed)
Addended by: Helene Shoe on: 07/18/2018 01:33 PM   Modules accepted: Orders

## 2018-07-18 NOTE — Telephone Encounter (Signed)
Spoke with Angela Ali.  She states [facility] did not sent her the benzonatate that was sent in on 06/16/2018 due to it is not covered by her insurance.  She is requesting that we send it to [facility] so she can pay out of pocket for it.  Rx sent as requested.  She also states she is having issues with hemorrhoids.  She states they are really swollen and she can hardly sit down.  She has been using Tucks and witch hazel with no relief.  Asking if there is anything else Dr. Diona Browner could recommend or prescribe for her.  Please advise.

## 2018-07-18 NOTE — Addendum Note (Signed)
Addended by: Carter Kitten on: 07/18/2018 03:06 PM   Modules accepted: Orders

## 2018-07-19 MED ORDER — HYDROCORTISONE ACE-PRAMOXINE 2.5-1 % RE CREA: 1.0000 "application " | TOPICAL_CREAM | Freq: Three times a day (TID) | RECTAL | 0 refills | Status: AC

## 2018-07-19 NOTE — Telephone Encounter (Signed)
Ms. Smaltz notified as instructed by telephone. 

## 2018-07-19 NOTE — Telephone Encounter (Signed)
Let pt know I will send in some topical medication for her to apply to rectum.. to local pharmacy.

## 2018-07-19 NOTE — Addendum Note (Signed)
Addended byEliezer Lofts E on: 07/19/2018 12:04 PM   Modules accepted: Orders

## 2018-07-28 ENCOUNTER — Ambulatory Visit (INDEPENDENT_AMBULATORY_CARE_PROVIDER_SITE_OTHER): Payer: Medicare Other | Admitting: General Practice

## 2018-07-28 ENCOUNTER — Other Ambulatory Visit (INDEPENDENT_AMBULATORY_CARE_PROVIDER_SITE_OTHER): Payer: Medicare Other

## 2018-07-28 ENCOUNTER — Other Ambulatory Visit: Payer: Self-pay

## 2018-07-28 DIAGNOSIS — I482 Chronic atrial fibrillation, unspecified: Secondary | ICD-10-CM | POA: Diagnosis not present

## 2018-07-28 DIAGNOSIS — Z5181 Encounter for therapeutic drug level monitoring: Secondary | ICD-10-CM

## 2018-07-28 DIAGNOSIS — Z7901 Long term (current) use of anticoagulants: Secondary | ICD-10-CM | POA: Diagnosis not present

## 2018-07-28 LAB — POCT INR: INR: 2.8 (ref 2.0–3.0)

## 2018-07-28 NOTE — Patient Instructions (Signed)
Pre visit review using our clinic review tool, if applicable. No additional management support is needed unless otherwise documented below in the visit note.  Continue to take 2.5mg  daily EXCEPT for 5mg  on Tuesday and Saturday and recheck in 3 to 4 weeks.

## 2018-07-29 ENCOUNTER — Telehealth: Payer: Self-pay

## 2018-07-29 MED ORDER — MONTELUKAST SODIUM 10 MG PO TABS: 10.0000 mg | ORAL_TABLET | Freq: Every day | ORAL | 3 refills | Status: DC

## 2018-07-29 NOTE — Telephone Encounter (Signed)
Robin, Please schedule WebEx Appt.

## 2018-07-29 NOTE — Telephone Encounter (Signed)
Agree needs webex to discuss acute worsening or chronic low back pain. Okay to refill singulair

## 2018-07-29 NOTE — Telephone Encounter (Signed)
Patient called and states she has chronic back pain and had some Flexeril 5 mg(this is in her historical med list but not current) that she has had filled in the past on hand but they have expired. She states she takes them as needed. Her back has been bothering her more lately and she has had to walk with bending forward more. No recent injury or trauma that patient remembers. Patient takes Tramadol as needed (last filled 04/08/18 for 30 tablets with 0 refills) also and wanted to get that refilled. Advised patient that she will probably need to do a WebEx with  Dr. Diona Browner even though this is chronic problem but I would ask provider first.  She also would like her Singulair refilled too.  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 If someone could call her back today please.

## 2018-08-01 NOTE — Telephone Encounter (Signed)
Appointment 4/7 Pt aware

## 2018-08-02 ENCOUNTER — Ambulatory Visit: Payer: Medicare Other | Admitting: Family Medicine

## 2018-08-02 ENCOUNTER — Other Ambulatory Visit: Payer: Self-pay

## 2018-08-02 MED ORDER — TRAMADOL HCL 50 MG PO TABS: 25.0000 mg | ORAL_TABLET | Freq: Two times a day (BID) | ORAL | 0 refills | Status: DC | PRN

## 2018-08-02 NOTE — Telephone Encounter (Signed)
Refill or deny Tramadol refill since patient cancelled her WebEx appointment today?

## 2018-08-02 NOTE — Telephone Encounter (Signed)
Given this is a chronic problem and she had stated no  Injury... okay to refill, but CALL  let her know if pain is increasing over time we will need to do a WEBEX or in office visit.

## 2018-08-02 NOTE — Telephone Encounter (Signed)
Please sign refill since it is controlled.

## 2018-08-15 ENCOUNTER — Encounter: Payer: Self-pay | Admitting: Internal Medicine

## 2018-08-15 ENCOUNTER — Ambulatory Visit (INDEPENDENT_AMBULATORY_CARE_PROVIDER_SITE_OTHER): Payer: Medicare Other | Admitting: Internal Medicine

## 2018-08-15 VITALS — Temp 97.7°F

## 2018-08-15 DIAGNOSIS — J0101 Acute recurrent maxillary sinusitis: Secondary | ICD-10-CM | POA: Diagnosis not present

## 2018-08-15 MED ORDER — AMOXICILLIN 500 MG PO TABS: 1000.0000 mg | ORAL_TABLET | Freq: Two times a day (BID) | ORAL | 0 refills | Status: AC

## 2018-08-15 NOTE — Assessment & Plan Note (Signed)
It is hard to tell if this is just the allergies or some degree of sinus infection She is on a lot of allergy medication--but still has worsened symptoms Will try amoxicillin again empirically Advised that she contact Dr Diona Browner if she is not feeling better

## 2018-08-15 NOTE — Progress Notes (Signed)
Subjective:    Patient ID: Angela Ali, female    DOB: 06-28-1928, 85 y.o.   MRN: [id number]  HPI Virtual visit for ongoing allergy or sinus symptoms Identification done Discussed billing and she gave consent She is in her home---granddaughter is also there (helped her with the call) I am in my office  She has "that allergy" She has ongoing maxillary pressure Feels mucus is stuck up there--can't get it to drain Some cough--mostly in daytime Continues on all her allergy medications No headache No ear pain No sore throat No fever No SOB  Doesn't really feel sick  Current Outpatient Medications on File Prior to Visit  Medication Sig Dispense Refill  . ACCU-CHEK FASTCLIX LANCETS MISC Use to check blood sugar 2 times a day.  Dx: E11.8 204 each 3  . acetaminophen (TYLENOL ARTHRITIS PAIN) 650 MG CR tablet Take 650 mg by mouth daily.    . Ascorbic Acid (VITAMIN C PO) Take 1 tablet by mouth daily.    . benzonatate (TESSALON) 100 MG capsule TAKE 2 CAPSULES BY MOUTH TWICE DAILY AS NEEDED FOR COUGH 180 capsule 0  . Blood Glucose Monitoring Suppl (ACCU-CHEK GUIDE) w/Device KIT 1 each by Does not apply route 2 (two) times daily. Dx: E11.8 1 kit 0  . Blood Pressure KIT Use daily as needed to check blood pressure. 1 each 0  . Cholecalciferol (VITAMIN D-3 PO) Take 1 tablet by mouth daily.    . cloNIDine (CATAPRES) 0.1 MG tablet Take 0.5 tablets (0.05 mg total) by mouth 2 (two) times daily. 90 tablet 3  . digoxin (LANOXIN) 0.125 MG tablet Take 1 tablet (125 mcg total) by mouth daily. 90 tablet 3  . fexofenadine (ALLEGRA) 180 MG tablet Take 1 tablet (180 mg total) by mouth daily. 90 tablet 3  . fluticasone (FLONASE) 50 MCG/ACT nasal spray Place 2 sprays into both nostrils daily. 45 g 3  . Fluticasone-Salmeterol (ADVAIR) 250-50 MCG/DOSE AEPB Inhale 1 puff into the lungs 2 (two) times daily. 3 each 3  . furosemide (LASIX) 20 MG tablet Take 1 tablet (20 mg total) by mouth daily. 90 tablet 3  .  glucose blood (ACCU-CHEK GUIDE) test strip Use to check blood sugar 2 times a day.  Dx: E11.8 200 each 1  . hydrALAZINE (APRESOLINE) 50 MG tablet Take 1 tablet (50 mg total) by mouth 3 (three) times daily. 270 tablet 3  . hydrocortisone-pramoxine (ANALPRAM-HC) 2.5-1 % rectal cream Place 1 application rectally 3 (three) times daily. 30 g 0  . latanoprost (XALATAN) 0.005 % ophthalmic solution     . losartan (COZAAR) 100 MG tablet Take 1 tablet (100 mg total) by mouth daily. 90 tablet 3  . metoprolol succinate (TOPROL-XL) 25 MG 24 hr tablet Take 1 tablet (25 mg total) by mouth daily. Take with or immediately following a meal. 90 tablet 3  . montelukast (SINGULAIR) 10 MG tablet Take 1 tablet (10 mg total) by mouth at bedtime. 90 tablet 3  . omeprazole (PRILOSEC) 20 MG capsule Take 1 capsule (20 mg total) by mouth 2 (two) times daily before a meal. 180 capsule 3  . traMADol (ULTRAM) 50 MG tablet Take 0.5-1 tablets (25-50 mg total) by mouth every 12 (twelve) hours as needed for moderate pain. 30 tablet 0  . warfarin (COUMADIN) 5 MG tablet TAKE AS DIRECTED BY ANTICOAGULATION  CLINIC 90 tablet 3  . atorvastatin (LIPITOR) 40 MG tablet Take 2 tablets (80 mg total) by mouth daily at 6 PM. 180  tablet 3  . [DISCONTINUED] esomeprazole (NEXIUM) 40 MG capsule Take 40 mg by mouth daily.       No current facility-administered medications on file prior to visit.     Allergies  Allergen Reactions  . Morphine And Related   . Percocet [Oxycodone-Acetaminophen] Nausea Only       . Tramadol Nausea Only    Past Medical History:  Diagnosis Date  . Allergic rhinitis, cause unspecified   . Atrial fibrillation ([place])   . Atrial flutter ([place])   . Bacterial pneumonia, unspecified   . Colonic polyp   . Congestive heart failure, unspecified   . Diabetes mellitus without complication (HCC)    borderline-checks sugars but no meds  . Diaphragmatic hernia without mention of obstruction or gangrene   . Displacement of  lumbar intervertebral disc without myelopathy   . Diverticulosis of colon (without mention of hemorrhage)   . Edema   . Esophageal reflux   . Long term (current) use of anticoagulants   . Osteoarthrosis, unspecified whether generalized or localized, unspecified site   . Other abnormal glucose   . Other and unspecified hyperlipidemia   . Other malaise and fatigue   . Overweight(278.02)   . Prediabetes   . Secondary cardiomyopathy, unspecified   . Splenic mass 08/21/2011  . Stricture and stenosis of esophagus   . Thigh abscess   . Unspecified essential hypertension     Past Surgical History:  Procedure Laterality Date  . ABDOMINAL HYSTERECTOMY  1971   partial  . ANKLE SURGERY Left 2001  . CHOLECYSTECTOMY  2004  . COLONOSCOPY    . ESOPHAGEAL DILATION  01/2007  . POLYPECTOMY    . spleen removed     2005 or 2006 in [place]    Family History  Problem Relation Age of Onset  . Emphysema Father 69  . Coronary artery disease Mother 58  . Hypertension Mother 71  . Ovarian cancer Daughter   . Bone cancer Son        bone marrow ca  . Hyperlipidemia Sister        # 1  . Hyperlipidemia Sister        # 2  . Diabetes Daughter        # 1  . Hypertension Daughter   . Diabetes Daughter        # 2   . Hypertension Daughter   . Colon cancer Neg Hx     Social History   Socioeconomic History  . Marital status: Widowed    Spouse name: Not on file  . Number of children: 8  . Years of education: Not on file  . Highest education level: Not on file  Occupational History  . Occupation: Retired Product manager: RETIRED    Comment: retired Pharmacist, hospital.  Used to live in [place], [place], now in [place] with her family  Social Needs  . Financial resource strain: Not on file  . Food insecurity:    Worry: Not on file    Inability: Not on file  . Transportation needs:    Medical: Not on file    Non-medical: Not on file  Tobacco Use  . Smoking status: Never Smoker  .  Smokeless tobacco: Never Used  Substance and Sexual Activity  . Alcohol use: No    Alcohol/week: 0.0 standard drinks  . Drug use: No  . Sexual activity: Never  Lifestyle  . Physical activity:    Days per week: Not on file  Minutes per session: Not on file  . Stress: Not on file  Relationships  . Social connections:    Talks on phone: Not on file    Gets together: Not on file    Attends religious service: Not on file    Active member of club or organization: Not on file    Attends meetings of clubs or organizations: Not on file    Relationship status: Not on file  . Intimate partner violence:    Fear of current or ex partner: Not on file    Emotionally abused: Not on file    Physically abused: Not on file    Forced sexual activity: Not on file  Other Topics Concern  . Not on file  Social History Narrative   Lost spouse to cancer in 07/2006   5 children   Does not get regular exercise   Review of Systems No stomach trouble---no nausea Appetite is okay    Objective:   Physical Exam  Constitutional: She appears well-developed. No distress.  HENT:  No maxillary tenderness  Respiratory: Effort normal. No respiratory distress.  Psychiatric: She has a normal mood and affect. Her behavior is normal.           Assessment & Plan:

## 2018-08-25 ENCOUNTER — Ambulatory Visit (INDEPENDENT_AMBULATORY_CARE_PROVIDER_SITE_OTHER): Payer: Medicare Other | Admitting: General Practice

## 2018-08-25 ENCOUNTER — Other Ambulatory Visit: Payer: Self-pay

## 2018-08-25 ENCOUNTER — Other Ambulatory Visit (INDEPENDENT_AMBULATORY_CARE_PROVIDER_SITE_OTHER): Payer: Medicare Other

## 2018-08-25 DIAGNOSIS — I4821 Permanent atrial fibrillation: Secondary | ICD-10-CM | POA: Diagnosis not present

## 2018-08-25 DIAGNOSIS — Z7901 Long term (current) use of anticoagulants: Secondary | ICD-10-CM | POA: Diagnosis not present

## 2018-08-25 DIAGNOSIS — I509 Heart failure, unspecified: Secondary | ICD-10-CM

## 2018-08-25 LAB — POCT INR: INR: 3.3 — AB (ref 2.0–3.0)

## 2018-08-25 NOTE — Patient Instructions (Signed)
Pre visit review using our clinic review tool, if applicable. No additional management support is needed unless otherwise documented below in the visit note.  Continue to take 2.5mg  daily EXCEPT for 5mg  on Tuesday and Saturday and recheck in 3 to 4 weeks. 5/28 @ 10:30.  Patient has been given dosing instructions.

## 2018-09-22 ENCOUNTER — Other Ambulatory Visit: Payer: Medicare Other

## 2018-09-22 ENCOUNTER — Other Ambulatory Visit (INDEPENDENT_AMBULATORY_CARE_PROVIDER_SITE_OTHER): Payer: Medicare Other

## 2018-09-22 ENCOUNTER — Ambulatory Visit (INDEPENDENT_AMBULATORY_CARE_PROVIDER_SITE_OTHER): Payer: Medicare Other | Admitting: General Practice

## 2018-09-22 DIAGNOSIS — Z7901 Long term (current) use of anticoagulants: Secondary | ICD-10-CM | POA: Diagnosis not present

## 2018-09-22 DIAGNOSIS — I4821 Permanent atrial fibrillation: Secondary | ICD-10-CM

## 2018-09-22 LAB — POCT INR: INR: 2.9 (ref 2.0–3.0)

## 2018-09-22 NOTE — Patient Instructions (Signed)
Pre visit review using our clinic review tool, if applicable. No additional management support is needed unless otherwise documented below in the visit note.  Continue to take 2.5mg  daily EXCEPT for 5mg  on Tuesday and Saturday and recheck in 3 to 4 weeks. Patient has been given dosing instructions.

## 2018-09-26 ENCOUNTER — Other Ambulatory Visit: Payer: Self-pay | Admitting: Family Medicine

## 2018-09-26 ENCOUNTER — Telehealth: Payer: Self-pay | Admitting: *Deleted

## 2018-09-26 NOTE — Telephone Encounter (Signed)
Patient called stating that she is having sinus /head congestion, sinus drainage and no fever. Patient wants to know if it is okay for her to take Musinex Sinus Max? Patient stated that the nose spay that she is using is not helping at all. Patient wants to know if you will prescribe another nasal spray for her and if so, send it to [facility]. Patient requested a call back.

## 2018-09-27 MED ORDER — AZELASTINE HCL 0.1 % NA SOLN: 2.0000 | Freq: Two times a day (BID) | NASAL | 3 refills | Status: DC

## 2018-09-27 NOTE — Telephone Encounter (Signed)
Angela Ali notified as instructed by telephone.  I advised she could take plain Mucinex but needs to avoid any that has a decongestant in it.  Patient states understanding.

## 2018-09-27 NOTE — Telephone Encounter (Signed)
She should not take mucinex sinus max as it has a decongestant.  Have her stop flonase nasal spray.. I will send a different spray [facility].

## 2018-10-12 ENCOUNTER — Other Ambulatory Visit: Payer: Self-pay

## 2018-10-12 NOTE — Patient Outreach (Signed)
[facility]) Care Management  10/12/2018  Angela Ali [birth year] [id number]   Medication Adherence call to [name] HIPPA Compliant Voice message left with a call back number. [name] is showing past due on Atorvastatin 40 mg under [place].   [facility] Management Direct Dial [phone]  Fax [phone] [email or web address]

## 2018-10-19 ENCOUNTER — Telehealth: Payer: Self-pay

## 2018-10-19 ENCOUNTER — Other Ambulatory Visit: Payer: Medicare Other

## 2018-10-19 NOTE — Telephone Encounter (Signed)
[facility] Night - Client Nonclinical Telephone Record AccessNurse Client [facility] Night - Client Client Site [place] - Night Physician Eliezer Lofts - MD Contact Type Call Who Is Calling Patient / Member / Family / Caregiver Caller Name Kayly Kriegel Caller Phone Number [phone] Patient Name Angela Ali Patient DOB 03-14-1929 Call Type Message Only Information Provided Reason for Call Request to Reschedule Office Appointment Initial Comment Caller has an appointment today at 10:15am. Her car is broken and she can't drive it right now. Can she get an appointment for later this evening? Additional Comment Please call back for the reschedule. Thank you. Call Closed By: Carma Lair Transaction Date/Time: 10/19/2018 7:44:09 AM (ET)

## 2018-10-20 ENCOUNTER — Other Ambulatory Visit: Payer: Medicare Other

## 2018-10-20 ENCOUNTER — Ambulatory Visit (INDEPENDENT_AMBULATORY_CARE_PROVIDER_SITE_OTHER): Payer: Medicare Other | Admitting: General Practice

## 2018-10-20 ENCOUNTER — Other Ambulatory Visit (INDEPENDENT_AMBULATORY_CARE_PROVIDER_SITE_OTHER): Payer: Medicare Other

## 2018-10-20 DIAGNOSIS — Z7901 Long term (current) use of anticoagulants: Secondary | ICD-10-CM

## 2018-10-20 DIAGNOSIS — I482 Chronic atrial fibrillation, unspecified: Secondary | ICD-10-CM

## 2018-10-20 LAB — POCT INR: INR: 3.3 — AB (ref 2.0–3.0)

## 2018-10-20 NOTE — Patient Instructions (Addendum)
Pre visit review using our clinic review tool, if applicable. No additional management support is needed unless otherwise documented below in the visit note.  Hold coumadin today (6/25) and then change dosage and take  2.5mg  daily EXCEPT for 5mg  on Tuesdays only.  Recheck in 3 to 4 weeks. Dosing instructions left on VM.

## 2018-11-03 ENCOUNTER — Ambulatory Visit (INDEPENDENT_AMBULATORY_CARE_PROVIDER_SITE_OTHER): Payer: Medicare Other | Admitting: Family Medicine

## 2018-11-03 ENCOUNTER — Other Ambulatory Visit: Payer: Self-pay

## 2018-11-03 ENCOUNTER — Encounter: Payer: Self-pay | Admitting: Family Medicine

## 2018-11-03 DIAGNOSIS — R0982 Postnasal drip: Secondary | ICD-10-CM

## 2018-11-03 DIAGNOSIS — F419 Anxiety disorder, unspecified: Secondary | ICD-10-CM | POA: Diagnosis not present

## 2018-11-03 DIAGNOSIS — K219 Gastro-esophageal reflux disease without esophagitis: Secondary | ICD-10-CM

## 2018-11-03 MED ORDER — PANTOPRAZOLE SODIUM 40 MG PO TBEC: 40.0000 mg | DELAYED_RELEASE_TABLET | Freq: Every day | ORAL | 3 refills | Status: DC

## 2018-11-03 NOTE — Patient Instructions (Addendum)
Stop omeprazole and start protonix 40 mg daily. Also at bedtime you can use  Pepcid AC.

## 2018-11-03 NOTE — Assessment & Plan Note (Signed)
?   If GERD from hiatal hernia causing increased mucus in throat as well as abdominal pressure.  trial of pantoprazola and additiona of Pecid AC at bedtime.

## 2018-11-03 NOTE — Assessment & Plan Note (Signed)
No better from mulitple trials of allergy.

## 2018-11-03 NOTE — Assessment & Plan Note (Signed)
Does feel anxious associated with abdominal pressure at night. If not improving will start a med for anxiety.

## 2018-11-03 NOTE — Progress Notes (Signed)
VIRTUAL VISIT Due to national recommendations of social distancing due to [place] 19, a virtual visit is felt to be most appropriate for this patient at this time.   I connected with the patient on 11/03/18 at  9:00 AM EDT by phone and verified that I am speaking with the correct person using two identifiers.   Interactive audio and video telecommunications were attempted between this provider and patient, however failed, due to patient having technical difficulties OR patient did not have access to video capability.  We continued and completed visit with audio only.   I discussed the limitations, risks, security and privacy concerns of performing an evaluation and management service by  virtual telehealth platform and the availability of in person appointments. I also discussed with the patient that there may be a patient responsible charge related to this service. The patient expressed understanding and agreed to proceed.  Patient location: Home Provider Location: [facility] Participants: Angela Ali and Craig Guess   Chief Complaint  Patient presents with  . Sinusitis  . Hiatal Hernia    History of Present Illness: 85 year old female presents with 2 new issues  1. sinus changes, non acute ongoing for years:  Nasal congestion and post nasal drip... mucus get  no face pain, no fever, no SOB, no cough, no green mucus.  On allegra, singulair, using Astelin.. more helpful than flonase.   2. Hiatla hernia:No reflux but feels like cannot take deep breaths from hiatal hernia. Sitting up seems to help. Sius and post nasl drip as and sensation of pressure in abdominal from hiatal hernia worse at night.  Using omeprazole 20 mg BID.Marland Kitchen doesn't help Feels full early. Feels anxious, cannot sleep.    COVID 19 screen No recent travel or known exposure to COVID19 The patient denies respiratory symptoms of COVID 19 at this time.  The importance of social distancing was discussed today.    Review of Systems  Constitutional: Negative for chills and fever.  HENT: Positive for congestion. Negative for ear discharge, ear pain, sinus pain and sore throat.   Eyes: Negative for pain and redness.  Respiratory: Negative for cough and shortness of breath.   Cardiovascular: Negative for chest pain, palpitations and leg swelling.  Gastrointestinal: Negative for abdominal pain, blood in stool, constipation, diarrhea, nausea and vomiting.  Genitourinary: Negative for dysuria.  Musculoskeletal: Negative for falls and myalgias.  Skin: Negative for rash.  Neurological: Negative for dizziness.  Psychiatric/Behavioral: Negative for depression. The patient is not nervous/anxious.       Past Medical History:  Diagnosis Date  . Allergic rhinitis, cause unspecified   . Atrial fibrillation ([place])   . Atrial flutter ([place])   . Bacterial pneumonia, unspecified   . Colonic polyp   . Congestive heart failure, unspecified   . Diabetes mellitus without complication (HCC)    borderline-checks sugars but no meds  . Diaphragmatic hernia without mention of obstruction or gangrene   . Displacement of lumbar intervertebral disc without myelopathy   . Diverticulosis of colon (without mention of hemorrhage)   . Edema   . Esophageal reflux   . Long term (current) use of anticoagulants   . Osteoarthrosis, unspecified whether generalized or localized, unspecified site   . Other abnormal glucose   . Other and unspecified hyperlipidemia   . Other malaise and fatigue   . Overweight(278.02)   . Prediabetes   . Secondary cardiomyopathy, unspecified   . Splenic mass 08/21/2011  . Stricture and stenosis  of esophagus   . Thigh abscess   . Unspecified essential hypertension     reports that she has never smoked. She has never used smokeless tobacco. She reports that she does not drink alcohol or use drugs.   Current Outpatient Medications:  .  ACCU-CHEK FASTCLIX LANCETS MISC, Use to check blood sugar 2  times a day.  Dx: E11.8, Disp: 204 each, Rfl: 3 .  ACCU-CHEK GUIDE test strip, USE TO CHECK BLOOD SUGAR  TWO TIMES DAILY, Disp: 100 each, Rfl: 5 .  acetaminophen (TYLENOL ARTHRITIS PAIN) 650 MG CR tablet, Take 650 mg by mouth daily., Disp: , Rfl:  .  Ascorbic Acid (VITAMIN C PO), Take 1 tablet by mouth daily., Disp: , Rfl:  .  atorvastatin (LIPITOR) 40 MG tablet, Take 2 tablets (80 mg total) by mouth daily at 6 PM., Disp: 180 tablet, Rfl: 3 .  azelastine (ASTELIN) 0.1 % nasal spray, Place 2 sprays into both nostrils 2 (two) times daily. Use in each nostril as directed, Disp: 30 mL, Rfl: 3 .  benzonatate (TESSALON) 100 MG capsule, TAKE 2 CAPSULES BY MOUTH TWICE DAILY AS NEEDED FOR COUGH, Disp: 180 capsule, Rfl: 0 .  Blood Glucose Monitoring Suppl (ACCU-CHEK GUIDE) w/Device KIT, 1 each by Does not apply route 2 (two) times daily. Dx: E11.8, Disp: 1 kit, Rfl: 0 .  Blood Pressure KIT, Use daily as needed to check blood pressure., Disp: 1 each, Rfl: 0 .  Cholecalciferol (VITAMIN D-3 PO), Take 1 tablet by mouth daily., Disp: , Rfl:  .  cloNIDine (CATAPRES) 0.1 MG tablet, Take 0.5 tablets (0.05 mg total) by mouth 2 (two) times daily., Disp: 90 tablet, Rfl: 3 .  digoxin (LANOXIN) 0.125 MG tablet, Take 1 tablet (125 mcg total) by mouth daily., Disp: 90 tablet, Rfl: 3 .  fexofenadine (ALLEGRA) 180 MG tablet, Take 1 tablet (180 mg total) by mouth daily., Disp: 90 tablet, Rfl: 3 .  Fluticasone-Salmeterol (ADVAIR) 250-50 MCG/DOSE AEPB, Inhale 1 puff into the lungs 2 (two) times daily., Disp: 3 each, Rfl: 3 .  furosemide (LASIX) 20 MG tablet, Take 1 tablet (20 mg total) by mouth daily., Disp: 90 tablet, Rfl: 3 .  hydrALAZINE (APRESOLINE) 50 MG tablet, Take 1 tablet (50 mg total) by mouth 3 (three) times daily., Disp: 270 tablet, Rfl: 3 .  hydrocortisone-pramoxine (ANALPRAM-HC) 2.5-1 % rectal cream, Place 1 application rectally 3 (three) times daily., Disp: 30 g, Rfl: 0 .  latanoprost (XALATAN) 0.005 % ophthalmic  solution, , Disp: , Rfl:  .  losartan (COZAAR) 100 MG tablet, Take 1 tablet (100 mg total) by mouth daily., Disp: 90 tablet, Rfl: 3 .  metoprolol succinate (TOPROL-XL) 25 MG 24 hr tablet, Take 1 tablet (25 mg total) by mouth daily. Take with or immediately following a meal., Disp: 90 tablet, Rfl: 3 .  montelukast (SINGULAIR) 10 MG tablet, Take 1 tablet (10 mg total) by mouth at bedtime., Disp: 90 tablet, Rfl: 3 .  omeprazole (PRILOSEC) 20 MG capsule, Take 1 capsule (20 mg total) by mouth 2 (two) times daily before a meal., Disp: 180 capsule, Rfl: 3 .  traMADol (ULTRAM) 50 MG tablet, Take 0.5-1 tablets (25-50 mg total) by mouth every 12 (twelve) hours as needed for moderate pain., Disp: 30 tablet, Rfl: 0 .  warfarin (COUMADIN) 5 MG tablet, TAKE AS DIRECTED BY ANTICOAGULATION  CLINIC, Disp: 90 tablet, Rfl: 3   Observations/Objective: Temperature 97.6 F (36.4 C), temperature source Oral, height 5' 3.5" (1.613 m).  Physical  Exam Physical Exam Constitutional:      General: The patient is not in acute distress. Pulmonary:     Effort: Pulmonary effort is normal. No respiratory distress.  Neurological:     Mental Status: The patient is alert and oriented to person, place, and time.  Psychiatric:        Mood and Affect: Mood normal.        Behavior: Behavior normal.    Assessment and Plan   GERD ? If GERD from hiatal hernia causing increased mucus in throat as well as abdominal pressure.  trial of pantoprazola and additiona of Pecid AC at bedtime.     Post-nasal drip No better from mulitple trials of allergy.  Anxiety Does feel anxious associated with abdominal pressure at night. If not improving will start a med for anxiety.  Total visit time 20 minutes, > 50% spent counseling and cordinating patients care.   I discussed the assessment and treatment plan with the patient. The patient was provided an opportunity to ask questions and all were answered. The patient agreed with the plan  and demonstrated an understanding of the instructions.   The patient was advised to call back or seek an in-person evaluation if the symptoms worsen or if the condition fails to improve as anticipated.     Angela Lofts, MD

## 2018-11-03 NOTE — Progress Notes (Signed)
Left message asking pt to call office 7/9/rbh

## 2018-11-04 ENCOUNTER — Telehealth: Payer: Self-pay | Admitting: Family Medicine

## 2018-11-04 DIAGNOSIS — E118 Type 2 diabetes mellitus with unspecified complications: Secondary | ICD-10-CM

## 2018-11-04 NOTE — Telephone Encounter (Signed)
-----   Message from Cloyd Stagers, RT sent at 11/04/2018 10:12 AM EDT ----- Regarding: Lab Orders for Tuesday 7.14.2020 Please place lab orders for Tuesday 7.14.2020, office visit for physical on Thursday 7.23.2020 Thank you, Dyke Maes RT(R)

## 2018-11-04 NOTE — Progress Notes (Signed)
7/14 7/23  Pt aware

## 2018-11-07 ENCOUNTER — Other Ambulatory Visit: Payer: Self-pay

## 2018-11-07 NOTE — Patient Outreach (Signed)
[facility]) Care Management  11/07/2018  NEETA STOREY 12-18-1928 [id number]   Medication Adherence call to Mrs. Estle Sabella HIPPA Compliant Voice message left with a call back number. Mrs. Seaborn is showing past due on Atorvastatin 40 mg under [place].   [facility] Management Direct Dial [phone]  Fax [phone] [email or web address]

## 2018-11-08 ENCOUNTER — Telehealth: Payer: Self-pay | Admitting: Family Medicine

## 2018-11-08 ENCOUNTER — Ambulatory Visit: Payer: Medicare Other

## 2018-11-08 DIAGNOSIS — E559 Vitamin D deficiency, unspecified: Secondary | ICD-10-CM

## 2018-11-08 DIAGNOSIS — E118 Type 2 diabetes mellitus with unspecified complications: Secondary | ICD-10-CM

## 2018-11-08 NOTE — Telephone Encounter (Signed)
-----   Message from Ellamae Sia sent at 11/04/2018 10:12 AM EDT ----- Regarding: Lab orders for Tuesday, 7.14.20 Lab orders for f/u

## 2018-11-11 ENCOUNTER — Ambulatory Visit (INDEPENDENT_AMBULATORY_CARE_PROVIDER_SITE_OTHER): Payer: Medicare Other | Admitting: Family Medicine

## 2018-11-11 ENCOUNTER — Ambulatory Visit (INDEPENDENT_AMBULATORY_CARE_PROVIDER_SITE_OTHER): Payer: Medicare Other

## 2018-11-11 ENCOUNTER — Telehealth: Payer: Self-pay

## 2018-11-11 DIAGNOSIS — E118 Type 2 diabetes mellitus with unspecified complications: Secondary | ICD-10-CM | POA: Diagnosis not present

## 2018-11-11 DIAGNOSIS — Z7901 Long term (current) use of anticoagulants: Secondary | ICD-10-CM | POA: Diagnosis not present

## 2018-11-11 LAB — COMPREHENSIVE METABOLIC PANEL
ALT: 13 U/L (ref 0–35)
AST: 12 U/L (ref 0–37)
Albumin: 4.3 g/dL (ref 3.5–5.2)
Alkaline Phosphatase: 89 U/L (ref 39–117)
BUN: 14 mg/dL (ref 6–23)
CO2: 32 mEq/L (ref 19–32)
Calcium: 9.8 mg/dL (ref 8.4–10.5)
Chloride: 105 mEq/L (ref 96–112)
Creatinine, Ser: 0.88 mg/dL (ref 0.40–1.20)
GFR: 60.68 mL/min (ref >60.00)
Glucose, Bld: 96 mg/dL (ref 70–99)
Potassium: 4 mEq/L (ref 3.5–5.1)
Sodium: 142 mEq/L (ref 135–145)
Total Bilirubin: 0.5 mg/dL (ref 0.2–1.2)
Total Protein: 6.4 g/dL (ref 6.0–8.3)

## 2018-11-11 LAB — LIPID PANEL
Cholesterol: 120 mg/dL (ref 0–200)
HDL: 39.5 mg/dL (ref >39.00)
LDL Cholesterol: 64 mg/dL (ref 0–99)
NonHDL: 80.78
Total CHOL/HDL Ratio: 3
Triglycerides: 85 mg/dL (ref 0.0–149.0)
VLDL: 17 mg/dL (ref 0.0–40.0)

## 2018-11-11 LAB — POCT INR: INR: 2.9 (ref 2.0–3.0)

## 2018-11-11 LAB — HEMOGLOBIN A1C: Hgb A1c MFr Bld: 5.9 % (ref 4.6–6.5)

## 2018-11-11 NOTE — Telephone Encounter (Signed)
Patient was unsteady on her feet today and needed standby 1:1 assistance.  We discussed her need for a walker for assistive device support to prevent falls. Daughter is present and in complete support of patient getting one. Daughter is driving her right now to all appointments and providing standby support for safety.   She denies wanting any home health PT support and is going to reach out to [facility] in [place] and see if they need anything from [place] to get a walker.  She will call me back on Monday and let me know what she finds out.  We can assist her with a script if she needs one and qualifies for insurance coverage.   FYI to Dr. Diona Browner.

## 2018-11-11 NOTE — Telephone Encounter (Signed)
Noted  

## 2018-11-11 NOTE — Patient Instructions (Signed)
INR today 2.9  Continue taking 1/2 pill (2.5mg ) daily EXCEPT 5mg  on Tuesdays. Recheck in 4 weeks.   Patient is doing well without changes to diet, health or medications.

## 2018-11-14 ENCOUNTER — Telehealth: Payer: Self-pay

## 2018-11-14 NOTE — Telephone Encounter (Signed)
1)Patient states Azelastine spray is going to be $168 and it is not on preferred list. Per [facility] Atrovent spray is on her list and would like to get this sent in if it is ok per Dr. Diona Browner. Pulled this medication in to be sent if approved.  2) patient wanted to know if she can post pone her visit that was scheduled for 11/17/2018? Her car is in a shop all week or more. She wanted to let you know she is doing better with her pain and on Pantoprazole vs Omeprazole. I advised patient we could change it to virtual visit but she was not sure what she should do and wanted to get Dr. Rometta Emery opinion. Please review.

## 2018-11-15 MED ORDER — IPRATROPIUM BROMIDE 0.03 % NA SOLN: 2.0000 | Freq: Two times a day (BID) | NASAL | 1 refills | Status: DC

## 2018-11-15 NOTE — Telephone Encounter (Signed)
1. Atrovent sent  2. If doing better can cencel appt.

## 2018-11-15 NOTE — Telephone Encounter (Signed)
Spoke with pt notifying her Dr. Diona Browner sent in Atrovent.  Pt verbalizes understanding.  Also, pt states she is feeling better and desires to c/x 11/17/18 appt.   Appt c/x.

## 2018-11-17 ENCOUNTER — Ambulatory Visit: Payer: Medicare Other

## 2018-11-17 ENCOUNTER — Ambulatory Visit: Payer: Medicare Other | Admitting: Family Medicine

## 2018-11-21 ENCOUNTER — Encounter: Payer: Self-pay | Admitting: Podiatry

## 2018-11-21 ENCOUNTER — Ambulatory Visit (INDEPENDENT_AMBULATORY_CARE_PROVIDER_SITE_OTHER): Payer: Medicare Other | Admitting: Podiatry

## 2018-11-21 ENCOUNTER — Telehealth: Payer: Self-pay

## 2018-11-21 ENCOUNTER — Other Ambulatory Visit: Payer: Self-pay

## 2018-11-21 DIAGNOSIS — L84 Corns and callosities: Secondary | ICD-10-CM

## 2018-11-21 DIAGNOSIS — E118 Type 2 diabetes mellitus with unspecified complications: Secondary | ICD-10-CM | POA: Diagnosis not present

## 2018-11-21 DIAGNOSIS — M79676 Pain in unspecified toe(s): Secondary | ICD-10-CM | POA: Diagnosis not present

## 2018-11-21 DIAGNOSIS — Z9229 Personal history of other drug therapy: Secondary | ICD-10-CM | POA: Diagnosis not present

## 2018-11-21 DIAGNOSIS — M79609 Pain in unspecified limb: Secondary | ICD-10-CM

## 2018-11-21 DIAGNOSIS — B351 Tinea unguium: Secondary | ICD-10-CM

## 2018-11-21 NOTE — Telephone Encounter (Signed)
Ms. Ciani notified as instructed by telephone.

## 2018-11-21 NOTE — Patient Instructions (Signed)
Diabetes Mellitus and Foot Care Foot care is an important part of your health, especially when you have diabetes. Diabetes may cause you to have problems because of poor blood flow (circulation) to your feet and legs, which can cause your skin to:  Become thinner and drier.  Break more easily.  Heal more slowly.  Peel and crack. You may also have nerve damage (neuropathy) in your legs and feet, causing decreased feeling in them. This means that you may not notice minor injuries to your feet that could lead to more serious problems. Noticing and addressing any potential problems early is the best way to prevent future foot problems. How to care for your feet Foot hygiene  Wash your feet daily with warm water and mild soap. Do not use hot water. Then, pat your feet and the areas between your toes until they are completely dry. Do not soak your feet as this can dry your skin.  Trim your toenails straight across. Do not dig under them or around the cuticle. File the edges of your nails with an emery board or nail file.  Apply a moisturizing lotion or petroleum jelly to the skin on your feet and to dry, brittle toenails. Use lotion that does not contain alcohol and is unscented. Do not apply lotion between your toes. Shoes and socks  Wear clean socks or stockings every day. Make sure they are not too tight. Do not wear knee-high stockings since they may decrease blood flow to your legs.  Wear shoes that fit properly and have enough cushioning. Always look in your shoes before you put them on to be sure there are no objects inside.  To break in new shoes, wear them for just a few hours a day. This prevents injuries on your feet. Wounds, scrapes, corns, and calluses  Check your feet daily for blisters, cuts, bruises, sores, and redness. If you cannot see the bottom of your feet, use a mirror or ask someone for help.  Do not cut corns or calluses or try to remove them with medicine.  If you  find a minor scrape, cut, or break in the skin on your feet, keep it and the skin around it clean and dry. You may clean these areas with mild soap and water. Do not clean the area with peroxide, alcohol, or iodine.  If you have a wound, scrape, corn, or callus on your foot, look at it several times a day to make sure it is healing and not infected. Check for: ? Redness, swelling, or pain. ? Fluid or blood. ? Warmth. ? Pus or a bad smell. General instructions  Do not cross your legs. This may decrease blood flow to your feet.  Do not use heating pads or hot water bottles on your feet. They may burn your skin. If you have lost feeling in your feet or legs, you may not know this is happening until it is too late.  Protect your feet from hot and cold by wearing shoes, such as at the beach or on hot pavement.  Schedule a complete foot exam at least once a year (annually) or more often if you have foot problems. If you have foot problems, report any cuts, sores, or bruises to your health care provider immediately. Contact a health care provider if:  You have a medical condition that increases your risk of infection and you have any cuts, sores, or bruises on your feet.  You have an injury that is not   healing.  You have redness on your legs or feet.  You feel burning or tingling in your legs or feet.  You have pain or cramps in your legs and feet.  Your legs or feet are numb.  Your feet always feel cold.  You have pain around a toenail. Get help right away if:  You have a wound, scrape, corn, or callus on your foot and: ? You have pain, swelling, or redness that gets worse. ? You have fluid or blood coming from the wound, scrape, corn, or callus. ? Your wound, scrape, corn, or callus feels warm to the touch. ? You have pus or a bad smell coming from the wound, scrape, corn, or callus. ? You have a fever. ? You have a red line going up your leg. Summary  Check your feet every day  for cuts, sores, red spots, swelling, and blisters.  Moisturize feet and legs daily.  Wear shoes that fit properly and have enough cushioning.  If you have foot problems, report any cuts, sores, or bruises to your health care provider immediately.  Schedule a complete foot exam at least once a year (annually) or more often if you have foot problems. This information is not intended to replace advice given to you by your health care provider. Make sure you discuss any questions you have with your health care provider. Document Released: 04/10/2000 Document Revised: 05/26/2017 Document Reviewed: 05/15/2016 Elsevier Patient Education  2020 [employer]   Onychomycosis/Fungal Toenails  WHAT IS IT? An infection that lies within the keratin of your nail plate that is caused by a fungus.  WHY ME? Fungal infections affect all ages, sexes, races, and creeds.  There may be many factors that predispose you to a fungal infection such as age, coexisting medical conditions such as diabetes, or an autoimmune disease; stress, medications, fatigue, genetics, etc.  Bottom line: fungus thrives in a warm, moist environment and your shoes offer such a location.  IS IT CONTAGIOUS? Theoretically, yes.  You do not want to share shoes, nail clippers or files with someone who has fungal toenails.  Walking around barefoot in the same room or sleeping in the same bed is unlikely to transfer the organism.  It is important to realize, however, that fungus can spread easily from one nail to the next on the same foot.  HOW DO WE TREAT THIS?  There are several ways to treat this condition.  Treatment may depend on many factors such as age, medications, pregnancy, liver and kidney conditions, etc.  It is best to ask your doctor which options are available to you.  1. No treatment.   Unlike many other medical concerns, you can live with this condition.  However for many people this can be a painful condition and may lead to  ingrown toenails or a bacterial infection.  It is recommended that you keep the nails cut short to help reduce the amount of fungal nail. 2. Topical treatment.  These range from herbal remedies to prescription strength nail lacquers.  About 40-50% effective, topicals require twice daily application for approximately 9 to 12 months or until an entirely new nail has grown out.  The most effective topicals are medical grade medications available through physicians offices. 3. Oral antifungal medications.  With an 80-90% cure rate, the most common oral medication requires 3 to 4 months of therapy and stays in your system for a year as the new nail grows out.  Oral antifungal medications do require   blood work to make sure it is a safe drug for you.  A liver function panel will be performed prior to starting the medication and after the first month of treatment.  It is important to have the blood work performed to avoid any harmful side effects.  In general, this medication safe but blood work is required. 4. Laser Therapy.  This treatment is performed by applying a specialized laser to the affected nail plate.  This therapy is noninvasive, fast, and non-painful.  It is not covered by insurance and is therefore, out of pocket.  The results have been very good with a 80-95% cure rate.  [facility] is the only practice in the area to offer this therapy. 5. Permanent Nail Avulsion.  Removing the entire nail so that a new nail will not grow back. 

## 2018-11-21 NOTE — Telephone Encounter (Signed)
Pt called requesting cb about recent labs; pt does not have upcoming appt until 04/2019.Please advise.

## 2018-11-21 NOTE — Telephone Encounter (Signed)
Everything looks great... stbale DM, chol at goal and nml metabolic panel.

## 2018-11-24 NOTE — Progress Notes (Signed)
Subjective: Angela Ali is a 85 y.o. y.o. female who is on long term blood thinner Coumadin presents today with painful, discolored, thick toenails  which interfere with daily activities. Pain is aggravated when wearing enclosed shoe gear. Pain is relieved with periodic professional debridement.  Patient's PCP is Jinny Sanders, MD and last date seen was 11/03/2018.  She is accompanied by her disabled daughter who she cares for.   Current Outpatient Medications:  .  ACCU-CHEK FASTCLIX LANCETS MISC, Use to check blood sugar 2 times a day.  Dx: E11.8, Disp: 204 each, Rfl: 3 .  ACCU-CHEK GUIDE test strip, USE TO CHECK BLOOD SUGAR  TWO TIMES DAILY, Disp: 100 each, Rfl: 5 .  acetaminophen (TYLENOL ARTHRITIS PAIN) 650 MG CR tablet, Take 650 mg by mouth daily., Disp: , Rfl:  .  Ascorbic Acid (VITAMIN C PO), Take 1 tablet by mouth daily., Disp: , Rfl:  .  atorvastatin (LIPITOR) 40 MG tablet, Take 2 tablets (80 mg total) by mouth daily at 6 PM., Disp: 180 tablet, Rfl: 3 .  benzonatate (TESSALON) 100 MG capsule, TAKE 2 CAPSULES BY MOUTH TWICE DAILY AS NEEDED FOR COUGH, Disp: 180 capsule, Rfl: 0 .  Blood Glucose Monitoring Suppl (ACCU-CHEK GUIDE) w/Device KIT, 1 each by Does not apply route 2 (two) times daily. Dx: E11.8, Disp: 1 kit, Rfl: 0 .  Blood Pressure KIT, Use daily as needed to check blood pressure., Disp: 1 each, Rfl: 0 .  Cholecalciferol (VITAMIN D-3 PO), Take 1 tablet by mouth daily., Disp: , Rfl:  .  cloNIDine (CATAPRES) 0.1 MG tablet, Take 0.5 tablets (0.05 mg total) by mouth 2 (two) times daily., Disp: 90 tablet, Rfl: 3 .  digoxin (LANOXIN) 0.125 MG tablet, Take 1 tablet (125 mcg total) by mouth daily., Disp: 90 tablet, Rfl: 3 .  fexofenadine (ALLEGRA) 180 MG tablet, Take 1 tablet (180 mg total) by mouth daily., Disp: 90 tablet, Rfl: 3 .  Fluticasone-Salmeterol (ADVAIR) 250-50 MCG/DOSE AEPB, Inhale 1 puff into the lungs 2 (two) times daily., Disp: 3 each, Rfl: 3 .  furosemide (LASIX) 20  MG tablet, Take 1 tablet (20 mg total) by mouth daily., Disp: 90 tablet, Rfl: 3 .  hydrALAZINE (APRESOLINE) 50 MG tablet, Take 1 tablet (50 mg total) by mouth 3 (three) times daily., Disp: 270 tablet, Rfl: 3 .  hydrocortisone-pramoxine (ANALPRAM-HC) 2.5-1 % rectal cream, Place 1 application rectally 3 (three) times daily., Disp: 30 g, Rfl: 0 .  ipratropium (ATROVENT) 0.03 % nasal spray, Place 2 sprays into both nostrils every 12 (twelve) hours., Disp: 30 mL, Rfl: 1 .  latanoprost (XALATAN) 0.005 % ophthalmic solution, , Disp: , Rfl:  .  losartan (COZAAR) 100 MG tablet, Take 1 tablet (100 mg total) by mouth daily., Disp: 90 tablet, Rfl: 3 .  metoprolol succinate (TOPROL-XL) 25 MG 24 hr tablet, Take 1 tablet (25 mg total) by mouth daily. Take with or immediately following a meal., Disp: 90 tablet, Rfl: 3 .  montelukast (SINGULAIR) 10 MG tablet, Take 1 tablet (10 mg total) by mouth at bedtime., Disp: 90 tablet, Rfl: 3 .  pantoprazole (PROTONIX) 40 MG tablet, Take 1 tablet (40 mg total) by mouth daily., Disp: 30 tablet, Rfl: 3 .  traMADol (ULTRAM) 50 MG tablet, Take 0.5-1 tablets (25-50 mg total) by mouth every 12 (twelve) hours as needed for moderate pain., Disp: 30 tablet, Rfl: 0 .  warfarin (COUMADIN) 5 MG tablet, TAKE AS DIRECTED BY ANTICOAGULATION  CLINIC, Disp: 90 tablet, Rfl:  3   Allergies  Allergen Reactions  . Morphine And Related   . Percocet [Oxycodone-Acetaminophen] Nausea Only       . Tramadol Nausea Only     Objective: Vascular Examination: Capillary refill time immediate x 10 digits.  Dorsalis pedis pulses 2/4 b/l.  Posterior tibial pulses 2/4 b/l.  Sparse digital hair x 10 digits.  Skin temperature gradient WNL b/l.  Trace edema BLE. No open wounds. No pain with calf compression. No increased warmth or erythema.  Dermatological Examination: Skin with normal turgor, texture and tone b/l.  Toenails 1-5 b/l discolored, thick, dystrophic with subungual debris and pain  with palpation to nailbeds due to thickness of nails.  Hyperkeratotic lesion distal tip left 2nd digit. No erythema, no edema, no drainage, no flocculence noted.  Musculoskeletal: Muscle strength 5/5 to all LE muscle groups.  Mild clawtoe deformity lesser digits.  Neurological: Sensation intact with 10 gram monofilament.  Vibratory sensation intact.  Assessment: Painful onychomycosis toenails 1-5 b/l in patient on blood thinner.   Plan: 1. Toenails 1-5 b/l were debrided in length and girth without iatrogenic bleeding. 2. Corn(s) pared left 2nd digit utilizing sterile scalpel blade without incident. Patient to continue soft, supportive shoe gear daily. Patient to report any pedal injuries to medical professional immediately. Avoid self trimming due to use of blood thinner. Follow up 3 months.  Patient/POA to call should there be a concern in the interim.

## 2018-11-29 ENCOUNTER — Other Ambulatory Visit: Payer: Self-pay | Admitting: Family Medicine

## 2018-11-29 NOTE — Telephone Encounter (Signed)
Last office visit 11/03/2018 for GERD, Post Nasal Drip and Anxiety.  Tizanidine not on current medication list.  Refill?

## 2018-12-08 ENCOUNTER — Other Ambulatory Visit: Payer: Self-pay

## 2018-12-08 ENCOUNTER — Ambulatory Visit (INDEPENDENT_AMBULATORY_CARE_PROVIDER_SITE_OTHER): Payer: Medicare Other | Admitting: General Practice

## 2018-12-08 ENCOUNTER — Ambulatory Visit: Payer: Medicare Other

## 2018-12-08 DIAGNOSIS — Z7901 Long term (current) use of anticoagulants: Secondary | ICD-10-CM | POA: Diagnosis not present

## 2018-12-08 LAB — POCT INR: INR: 2.4 (ref 2.0–3.0)

## 2018-12-08 NOTE — Patient Instructions (Addendum)
Pre visit review using our clinic review tool, if applicable. No additional management support is needed unless otherwise documented below in the visit note.  Continue taking 1/2 pill (2.5mg ) daily EXCEPT 5mg  on Tuesdays. Recheck Tuesday 9/8 with Mandy.

## 2018-12-09 ENCOUNTER — Telehealth: Payer: Self-pay | Admitting: Family Medicine

## 2018-12-09 MED ORDER — DICLOFENAC SODIUM 1 % TD GEL: 4.0000 g | Freq: Four times a day (QID) | TRANSDERMAL | 11 refills | Status: DC

## 2018-12-09 NOTE — Telephone Encounter (Signed)
Only should used voltaren if it is the gel

## 2018-12-09 NOTE — Telephone Encounter (Signed)
Yep.. gel ordered.

## 2018-12-09 NOTE — Addendum Note (Signed)
Addended by: Eliezer Lofts E on: 12/09/2018 05:28 PM   Modules accepted: Orders

## 2018-12-09 NOTE — Telephone Encounter (Signed)
Patient called today and stated that her daughter is using Voltaren. She would like to know if this is something that she can use.  Patient stated she would just like to use some of her daughter's but wanted to make sure that this is okay for her.   Call Back Number- [phone]

## 2018-12-09 NOTE — Telephone Encounter (Signed)
error 

## 2018-12-09 NOTE — Telephone Encounter (Signed)
Yes, the gel is what she is referring to.  She wants to use Angela Ali's.  Do you want to send her in a prescription as well??

## 2018-12-12 NOTE — Progress Notes (Signed)
error 

## 2018-12-14 ENCOUNTER — Telehealth: Payer: Self-pay | Admitting: *Deleted

## 2018-12-14 NOTE — Telephone Encounter (Signed)
Angela Ali notified by telephone that her blood type is A+ per type and screen done on 01/06/2012.

## 2018-12-14 NOTE — Telephone Encounter (Signed)
Patient left a voicemail wanting to know what blood type she has?

## 2018-12-29 ENCOUNTER — Other Ambulatory Visit: Payer: Self-pay | Admitting: Family Medicine

## 2018-12-29 NOTE — Telephone Encounter (Signed)
Last office visit 11/11/2018 for DM.  Last refilled 07/18/2018 for #180 with no refills.  CPE scheduled for 05/05/2019.

## 2018-12-30 ENCOUNTER — Ambulatory Visit: Payer: Self-pay

## 2018-12-30 ENCOUNTER — Other Ambulatory Visit: Payer: Self-pay

## 2018-12-30 ENCOUNTER — Ambulatory Visit: Payer: Medicare Other

## 2018-12-30 NOTE — Telephone Encounter (Signed)
Ms. Linenberger notified as instructed by telephone. 

## 2018-12-30 NOTE — Telephone Encounter (Signed)
Most likely allergies but given daughter also with symtpoms... recommend having covid19 testing. Please let her know the sites and times for today. She can try Xyzal for allergies.

## 2018-12-30 NOTE — Telephone Encounter (Signed)
Pt. Reports she started with runny nose last week as well as her daughter. No fever. Has taken allergy medicine. Wants to know if Dr. Diona Browner thinks she needs COVID 19 test, and wants to know what else to take for "allergy symptoms." Please advise.

## 2019-01-03 ENCOUNTER — Ambulatory Visit: Payer: Medicare Other

## 2019-01-03 ENCOUNTER — Ambulatory Visit: Payer: Medicare Other | Admitting: Family Medicine

## 2019-01-05 ENCOUNTER — Ambulatory Visit (INDEPENDENT_AMBULATORY_CARE_PROVIDER_SITE_OTHER): Payer: Medicare Other | Admitting: General Practice

## 2019-01-05 DIAGNOSIS — Z7901 Long term (current) use of anticoagulants: Secondary | ICD-10-CM

## 2019-01-05 LAB — POCT INR: INR: 1.9 — AB (ref 2.0–3.0)

## 2019-01-05 NOTE — Patient Instructions (Addendum)
Pre visit review using our clinic review tool, if applicable. No additional management support is needed unless otherwise documented below in the visit note.  Take 1 tablet today (9/10) and then continue taking 1/2 pill (2.5mg ) daily EXCEPT 5mg  on Tuesdays. Recheck in 4 weeks.

## 2019-01-17 ENCOUNTER — Other Ambulatory Visit: Payer: Self-pay | Admitting: Family Medicine

## 2019-01-30 ENCOUNTER — Telehealth: Payer: Self-pay | Admitting: Family Medicine

## 2019-01-30 NOTE — Telephone Encounter (Signed)
Best number [phone]  Pt called wanting to schedule appointment with dr Diona Browner on Thursday same day as PT/INR.  She stated she is still having ongoing sinus issues.  Her symptoms: cannot blow anything out of her nose and has lots of drainage. Temp 97.7   Do you want pt to come in office or do virtual

## 2019-01-30 NOTE — Telephone Encounter (Signed)
Dr. Diona Browner is not in the office on Thursday.  But I will forward to see if she can be seen in office vs. virtual.

## 2019-01-30 NOTE — Telephone Encounter (Signed)
Virtual

## 2019-01-31 ENCOUNTER — Encounter: Payer: Self-pay | Admitting: Family Medicine

## 2019-01-31 ENCOUNTER — Ambulatory Visit (INDEPENDENT_AMBULATORY_CARE_PROVIDER_SITE_OTHER): Payer: Medicare Other | Admitting: Family Medicine

## 2019-01-31 DIAGNOSIS — J3489 Other specified disorders of nose and nasal sinuses: Secondary | ICD-10-CM | POA: Diagnosis not present

## 2019-01-31 MED ORDER — PREDNISONE 10 MG PO TABS: ORAL_TABLET | ORAL | 0 refills | Status: DC

## 2019-01-31 MED ORDER — LEVOCETIRIZINE DIHYDROCHLORIDE 5 MG PO TABS: 5.0000 mg | ORAL_TABLET | Freq: Every evening | ORAL | 11 refills | Status: AC

## 2019-01-31 NOTE — Assessment & Plan Note (Signed)
Chronic.. no clear sign of infeciton.  Stop old antibiotics.  Continue Xyzal and atrovent as both seems to help more that flonase and allegra.  Continue sigulair.  Complete course of steroid and add nasal saline irrigation.

## 2019-01-31 NOTE — Telephone Encounter (Signed)
Appointment 10/6 Pt aware

## 2019-01-31 NOTE — Progress Notes (Signed)
VIRTUAL VISIT Due to national recommendations of social distancing due to [place] 19, a virtual visit is felt to be most appropriate for this patient at this time.   I connected with the patient on 01/31/19 at  2:00 PM EDT by virtual telehealth platform and verified that I am speaking with the correct person using two identifiers.   I discussed the limitations, risks, security and privacy concerns of performing an evaluation and management service by  virtual telehealth platform and the availability of in person appointments. I also discussed with the patient that there may be a patient responsible charge related to this service. The patient expressed understanding and agreed to proceed.  Patient location: Home Provider Location: [facility] Participants: Eliezer Lofts and Craig Guess   Chief Complaint  Patient presents with  . Nasal Congestion  . Facial Pressure    History of Present Illness:  85 year old female with history of allergies presents with nasal congestion, facial pain/pressure for several months.. never goes away.  Pain in between eye, post nasal drip. Using nasal spray ( atroventr may have helped more) and  xyzal... helps some but not much.. Clear nasal congestion.  She denies fever, no SOB above baseline. Not ST.  Asthma well controlled.   She started old cephalexin.  COVID 19 screen No recent travel or known exposure to COVID19 The patient denies respiratory symptoms of COVID 19 at this time.  The importance of social distancing was discussed today.   Review of Systems  Constitutional: Negative for chills and fever.  HENT: Positive for congestion and sinus pain. Negative for ear pain.   Eyes: Negative for pain and redness.  Respiratory: Negative for cough and shortness of breath.   Cardiovascular: Negative for chest pain, palpitations and leg swelling.  Gastrointestinal: Negative for abdominal pain, blood in stool, constipation, diarrhea, nausea and  vomiting.  Genitourinary: Negative for dysuria.  Musculoskeletal: Negative for falls and myalgias.  Skin: Negative for rash.  Neurological: Negative for dizziness.  Psychiatric/Behavioral: Negative for depression. The patient is not nervous/anxious.       Past Medical History:  Diagnosis Date  . Allergic rhinitis, cause unspecified   . Atrial fibrillation ([place])   . Atrial flutter ([place])   . Bacterial pneumonia, unspecified   . Colonic polyp   . Congestive heart failure, unspecified   . Diabetes mellitus without complication (H[facility])    borderline-checks sugars but no meds  . Diaphragmatic hernia without mention of obstruction or gangrene   . Displacement of lumbar intervertebral disc without myelopathy   . Diverticulosis of colon (without mention of hemorrhage)   . Edema   . Esophageal reflux   . Long term (current) use of anticoagulants   . Osteoarthrosis, unspecified whether generalized or localized, unspecified site   . Other abnormal glucose   . Other and unspecified hyperlipidemia   . Other malaise and fatigue   . Overweight(278.02)   . Prediabetes   . Secondary cardiomyopathy, unspecified   . Splenic mass 08/21/2011  . Stricture and stenosis of esophagus   . Thigh abscess   . Unspecified essential hypertension     reports that she has never smoked. She has never used smokeless tobacco. She reports that she does not drink alcohol or use drugs.   Current Outpatient Medications:  .  ACCU-CHEK FASTCLIX LANCETS MISC, Use to check blood sugar 2 times a day.  Dx: E11.8, Disp: 204 each, Rfl: 3 .  ACCU-CHEK GUIDE test strip, USE TO CHECK  BLOOD SUGAR  TWO TIMES DAILY, Disp: 100 each, Rfl: 5 .  acetaminophen (TYLENOL ARTHRITIS PAIN) 650 MG CR tablet, Take 650 mg by mouth daily., Disp: , Rfl:  .  Ascorbic Acid (VITAMIN C PO), Take 1 tablet by mouth daily., Disp: , Rfl:  .  atorvastatin (LIPITOR) 40 MG tablet, Take 2 tablets (80 mg total) by mouth daily at 6 PM., Disp: 180 tablet, Rfl:  3 .  benzonatate (TESSALON) 100 MG capsule, TAKE 2 CAPSULES BY MOUTH TWICE DAILY AS NEEDED FOR COUGH, Disp: 120 capsule, Rfl: 1 .  Blood Glucose Monitoring Suppl (ACCU-CHEK GUIDE) w/Device KIT, 1 each by Does not apply route 2 (two) times daily. Dx: E11.8, Disp: 1 kit, Rfl: 0 .  Blood Pressure KIT, Use daily as needed to check blood pressure., Disp: 1 each, Rfl: 0 .  Cholecalciferol (VITAMIN D-3 PO), Take 1 tablet by mouth daily., Disp: , Rfl:  .  cloNIDine (CATAPRES) 0.1 MG tablet, Take 0.5 tablets (0.05 mg total) by mouth 2 (two) times daily., Disp: 90 tablet, Rfl: 3 .  diclofenac sodium (VOLTAREN) 1 % GEL, Apply 4 g topically 4 (four) times daily. 2 g to small joints and 4 grams to large joints, Disp: 100 g, Rfl: 11 .  digoxin (LANOXIN) 0.125 MG tablet, Take 1 tablet (125 mcg total) by mouth daily., Disp: 90 tablet, Rfl: 3 .  fexofenadine (ALLEGRA) 180 MG tablet, Take 1 tablet (180 mg total) by mouth daily., Disp: 90 tablet, Rfl: 3 .  Fluticasone-Salmeterol (ADVAIR) 250-50 MCG/DOSE AEPB, Inhale 1 puff into the lungs 2 (two) times daily., Disp: 3 each, Rfl: 3 .  furosemide (LASIX) 20 MG tablet, Take 1 tablet (20 mg total) by mouth daily., Disp: 90 tablet, Rfl: 3 .  hydrALAZINE (APRESOLINE) 50 MG tablet, Take 1 tablet (50 mg total) by mouth 3 (three) times daily., Disp: 270 tablet, Rfl: 3 .  hydrocortisone-pramoxine (ANALPRAM-HC) 2.5-1 % rectal cream, Place 1 application rectally 3 (three) times daily., Disp: 30 g, Rfl: 0 .  ipratropium (ATROVENT) 0.03 % nasal spray, Place 2 sprays into both nostrils every 12 (twelve) hours., Disp: 30 mL, Rfl: 1 .  latanoprost (XALATAN) 0.005 % ophthalmic solution, , Disp: , Rfl:  .  losartan (COZAAR) 100 MG tablet, Take 1 tablet (100 mg total) by mouth daily., Disp: 90 tablet, Rfl: 3 .  metoprolol succinate (TOPROL-XL) 25 MG 24 hr tablet, Take 1 tablet (25 mg total) by mouth daily. Take with or immediately following a meal., Disp: 90 tablet, Rfl: 3 .  montelukast  (SINGULAIR) 10 MG tablet, Take 1 tablet (10 mg total) by mouth at bedtime., Disp: 90 tablet, Rfl: 3 .  omeprazole (PRILOSEC) 20 MG capsule, , Disp: , Rfl:  .  tiZANidine (ZANAFLEX) 2 MG tablet, TAKE 1 TABLET (2 MG TOTAL) BY MOUTH EVERY 6 (SIX) HOURS AS NEEDED FOR MUSCLE SPASMS., Disp: 30 tablet, Rfl: 3 .  traMADol (ULTRAM) 50 MG tablet, Take 0.5-1 tablets (25-50 mg total) by mouth every 12 (twelve) hours as needed for moderate pain., Disp: 30 tablet, Rfl: 0 .  warfarin (COUMADIN) 5 MG tablet, TAKE AS DIRECTED BY ANTICOAGULATION  CLINIC, Disp: 90 tablet, Rfl: 3   Observations/Objective: Blood pressure (!) 160/85, pulse 72, temperature 97.8 F (36.6 C), temperature source Oral, height 5' 3.5" (1.613 m), weight 162 lb (73.5 kg).  Physical Exam  Physical Exam Constitutional:      General: The patient is not in acute distress. Pulmonary:     Effort: Pulmonary effort is  normal. No respiratory distress.  Neurological:     Mental Status: The patient is alert and oriented to person, place, and time.  Psychiatric:        Mood and Affect: Mood normal.        Behavior: Behavior normal.   Assessment and Plan Sinus pressure Chronic.. no clear sign of infeciton.  Stop old antibiotics.  Continue Xyzal and atrovent as both seems to help more that flonase and allegra.  Continue sigulair.  Complete course of steroid and add nasal saline irrigation.     I discussed the assessment and treatment plan with the patient. The patient was provided an opportunity to ask questions and all were answered. The patient agreed with the plan and demonstrated an understanding of the instructions.   The patient was advised to call back or seek an in-person evaluation if the symptoms worsen or if the condition fails to improve as anticipated.     Eliezer Lofts, MD

## 2019-02-02 ENCOUNTER — Ambulatory Visit (INDEPENDENT_AMBULATORY_CARE_PROVIDER_SITE_OTHER): Payer: Medicare Other | Admitting: General Practice

## 2019-02-02 ENCOUNTER — Ambulatory Visit: Payer: Medicare Other | Admitting: Family Medicine

## 2019-02-02 DIAGNOSIS — Z7901 Long term (current) use of anticoagulants: Secondary | ICD-10-CM | POA: Diagnosis not present

## 2019-02-02 LAB — POCT INR: INR: 2.4 (ref 2.0–3.0)

## 2019-02-02 NOTE — Patient Instructions (Incomplete)
Pre visit review using our clinic review tool, if applicable. No additional management support is needed unless otherwise documented below in the visit note.  Continue taking 1/2 pill (2.5mg ) daily EXCEPT 5mg  on Tuesdays. Recheck in 4 weeks.

## 2019-02-09 ENCOUNTER — Telehealth: Payer: Self-pay | Admitting: Family Medicine

## 2019-02-09 NOTE — Telephone Encounter (Signed)
Patient stated that she has finished the prednisone that she was prescribed, she finished this on Tuesday. She wanted to check to see if you would like her to schedule a follow up visit with you since she has completed it

## 2019-02-09 NOTE — Telephone Encounter (Signed)
No need for follow up if symptoms improved.

## 2019-02-09 NOTE — Telephone Encounter (Signed)
Angela Ali notified as instructed by telephone.  She states the prednisone helped a lot and she is feeling much better.  No follow up needed at this time.

## 2019-02-15 ENCOUNTER — Telehealth: Payer: Self-pay

## 2019-02-15 ENCOUNTER — Ambulatory Visit: Payer: Medicare Other | Admitting: Family Medicine

## 2019-02-15 NOTE — Telephone Encounter (Signed)
[place] Night - Client Nonclinical Telephone Record AccessNurse Client [place] Night - Client Client Site [place] - Night Physician Eliezer Lofts - MD Contact Type Call Who Is Calling Patient / Member / Family / Caregiver Caller Name Candas Esser Caller Phone Number [phone] Patient Name Angela Ali Patient DOB Feb 02, 1929 Call Type Message Only Information Provided Reason for Call Request to [facility] Appointment Initial Comment Caller states she has an appt to see her doctor tomorrow. She said she used heat and has taken Tylenol and her leg is feeling and she does not really need that appt tomorrow. She would like to cancel that appt. Additional Comment Call Closed By: Vivien Presto Transaction Date/Time: 02/14/2019 5:25:11 PM (ET)

## 2019-02-15 NOTE — Progress Notes (Deleted)
Angela Nebel T. Kalani [name], MD Primary Care and Sports Medicine [facility] at [facility] [address] [place], [address] Phone: [phone]  FAX: [phone]  Angela Ali - 85 y.o. female  MRN [id number]  Date of Birth: May 19, 1928  Visit Date: 02/15/2019  PCP: Excell Seltzer, MD  Referred by: Excell Seltzer, MD  No chief complaint on file.  Subjective:   Angela Ali is a 85 y.o. very pleasant female patient with There is no height or weight on file to calculate BMI. who presents with the following:  She presents with leg pain.    Past Medical History, Surgical History, Social History, Family History, Problem List, Medications, and Allergies have been reviewed and updated if relevant.  Patient Active Problem List   Diagnosis Date Noted  . Sinus pressure 01/31/2019  . Bloating 06/10/2018  . Anxiety 04/29/2018  . Acute sinus infection 06/23/2017  . Post-nasal drip 06/08/2017  . Long term (current) use of anticoagulants 04/08/2017  . First degree burn of right forearm 02/19/2017  . Bulging lumbar disc 11/20/2016  . Obesity with serious comorbidity 11/25/2015  . Sacroiliac joint pain 09/26/2015  . Lipoma of back 06/06/2015  . Osteoarthritis of both knees 01/18/2015  . Counseling regarding end of life decision making 09/07/2014  . Encounter for therapeutic drug monitoring 06/05/2013  . Chronic diastolic CHF (congestive heart failure) (HCC) 03/21/2013  . Chronic cough 02/03/2013  . Recurrent boils 06/22/2012  . Chronic constipation 12/01/2011  . Splenic mass 08/21/2011  . Hx of Hematoma of leg, with chronic leg weakness and numbness 09/15/2010  . Moderate persistent asthma in adult without complication 07/03/2009  . FATIGUE, CHRONIC 03/19/2009  . Vitamin D deficiency 02/26/2009  . LOW BACK PAIN, CHRONIC 11/19/2008  . Secondary cardiomyopathy (HCC) 10/02/2008  . Allergic rhinitis due to pollen 03/23/2008  . BENIGN NEOPLASM OTH&UNSPEC SITE  DIGESTIVE SYSTEM 02/01/2007  . STRICTURE, ESOPHAGEAL 02/01/2007  . Diabetes mellitus type 2 with complications (HCC) 12/24/2006  . Osteoporosis 12/21/2006  . Hyperlipemia 12/16/2006  . Essential hypertension, benign 12/16/2006  . Atrial fibrillation (HCC) 12/16/2006  . Congestive heart failure (HCC) 12/16/2006  . GERD 12/16/2006  . HIATAL HERNIA 12/16/2006  . OSTEOARTHRITIS 12/16/2006  . KNEE PAIN, RIGHT, CHRONIC 12/16/2006  . HERNIATED LUMBAR DISC 12/16/2006  . PERIPHERAL EDEMA 12/16/2006  . COLONIC POLYPS, HYPERPLASTIC 08/26/2000  . DIVERTICULOSIS, COLON 08/26/2000    Past Medical History:  Diagnosis Date  . Allergic rhinitis, cause unspecified   . Atrial fibrillation (HCC)   . Atrial flutter (HCC)   . Bacterial pneumonia, unspecified   . Colonic polyp   . Congestive heart failure, unspecified   . Diabetes mellitus without complication (HCC)    borderline-checks sugars but no meds  . Diaphragmatic hernia without mention of obstruction or gangrene   . Displacement of lumbar intervertebral disc without myelopathy   . Diverticulosis of colon (without mention of hemorrhage)   . Edema   . Esophageal reflux   . Long term (current) use of anticoagulants   . Osteoarthrosis, unspecified whether generalized or localized, unspecified site   . Other abnormal glucose   . Other and unspecified hyperlipidemia   . Other malaise and fatigue   . Overweight(278.02)   . Prediabetes   . Secondary cardiomyopathy, unspecified   . Splenic mass 08/21/2011  . Stricture and stenosis of esophagus   . Thigh abscess   . Unspecified essential hypertension     Past Surgical History:  Procedure  Laterality Date  . ABDOMINAL HYSTERECTOMY  1971   partial  . ANKLE SURGERY Left 2001  . CHOLECYSTECTOMY  2004  . COLONOSCOPY    . ESOPHAGEAL DILATION  01/2007  . POLYPECTOMY    . spleen removed     2005 or 2006 in [place]    Social History   Socioeconomic History  . Marital status: Widowed     Spouse name: Not on file  . Number of children: 8  . Years of education: Not on file  . Highest education level: Not on file  Occupational History  . 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  Used to live in [place], [place], now in [place] with her family  Social Needs  . Financial resource strain: Not on file  . Food insecurity    Worry: Not on file    Inability: Not on file  . Transportation needs    Medical: Not on file    Non-medical: Not on file  Tobacco Use  . Smoking status: Never Smoker  . Smokeless tobacco: Never Used  Substance and Sexual Activity  . Alcohol use: No    Alcohol/week: 0.0 standard drinks  . Drug use: No  . Sexual activity: Never  Lifestyle  . Physical activity    Days per week: Not on file    Minutes per session: Not on file  . Stress: Not on file  Relationships  . Social Musician on phone: Not on file    Gets together: Not on file    Attends religious service: Not on file    Active member of club or organization: Not on file    Attends meetings of clubs or organizations: Not on file    Relationship status: Not on file  . Intimate partner violence    Fear of current or ex partner: Not on file    Emotionally abused: Not on file    Physically abused: Not on file    Forced sexual activity: Not on file  Other Topics Concern  . Not on file  Social History Narrative   Lost spouse to cancer in 07/2006   5 children   Does not get regular exercise    Family History  Problem Relation Age of Onset  . Emphysema Father 28  . Coronary artery disease Mother 41  . Hypertension Mother 5  . Ovarian cancer Daughter   . Bone cancer Son        bone marrow ca  . Hyperlipidemia Sister        # 1  . Hyperlipidemia Sister        # 2  . Diabetes Daughter        # 1  . Hypertension Daughter   . Diabetes Daughter        # 2   . Hypertension Daughter   . Colon cancer Neg Hx     Allergies   Allergen Reactions  . Morphine And Related   . Percocet [Oxycodone-Acetaminophen] Nausea Only       . Tramadol Nausea Only    Medication list reviewed and updated in full in [place] Link.  GEN: No fevers, chills. Nontoxic. Primarily MSK c/o today. MSK: Detailed in the HPI GI: tolerating PO intake without difficulty Neuro: No numbness, parasthesias, or tingling associated. Otherwise the pertinent positives of the ROS are noted above.   Objective:   There were no vitals taken for this visit.  ***  Radiology: No results found.  Assessment and Plan:   ***

## 2019-02-15 NOTE — Telephone Encounter (Signed)
Appointment canceled.

## 2019-02-17 DIAGNOSIS — Z803 Family history of malignant neoplasm of breast: Secondary | ICD-10-CM | POA: Diagnosis not present

## 2019-02-17 DIAGNOSIS — Z1231 Encounter for screening mammogram for malignant neoplasm of breast: Secondary | ICD-10-CM | POA: Diagnosis not present

## 2019-02-17 LAB — HM MAMMOGRAPHY

## 2019-02-22 ENCOUNTER — Ambulatory Visit: Payer: Medicare Other | Admitting: Podiatry

## 2019-02-24 ENCOUNTER — Encounter: Payer: Self-pay | Admitting: Family Medicine

## 2019-03-07 ENCOUNTER — Other Ambulatory Visit: Payer: Self-pay | Admitting: Family Medicine

## 2019-03-09 ENCOUNTER — Other Ambulatory Visit: Payer: Self-pay

## 2019-03-09 ENCOUNTER — Ambulatory Visit (INDEPENDENT_AMBULATORY_CARE_PROVIDER_SITE_OTHER): Payer: Medicare Other | Admitting: General Practice

## 2019-03-09 ENCOUNTER — Telehealth: Payer: Self-pay | Admitting: Family Medicine

## 2019-03-09 ENCOUNTER — Other Ambulatory Visit: Payer: Self-pay | Admitting: Family Medicine

## 2019-03-09 DIAGNOSIS — Z7901 Long term (current) use of anticoagulants: Secondary | ICD-10-CM

## 2019-03-09 LAB — POCT INR: INR: 2.6 (ref 2.0–3.0)

## 2019-03-09 NOTE — Telephone Encounter (Signed)
Patient has an appointment today @ 12 for her COUMADIN CHECK.  She is requesting a call back before her appointment, Patient is not sure if she can make it due to the weather and concerned about coming out.    She was going to reschedule until next Thursday but wanted to speak to you first before she cancels her apptointment and moves it.   Can you call this patient?

## 2019-03-09 NOTE — Patient Instructions (Signed)
Pre visit review using our clinic review tool, if applicable. No additional management support is needed unless otherwise documented below in the visit note.  Continue taking 1/2 pill (2.5mg ) daily EXCEPT 5mg  on Tuesdays. Recheck in 4 weeks.

## 2019-03-12 NOTE — Progress Notes (Deleted)
Angela Dicenzo T. Angela Srey, MD Primary Care and Sports Medicine [facility] at [facility] [address] [place], [address] Phone: [phone]  FAX: [phone]  Angela Ali - 85 y.o. female  MRN [id number]  Date of Birth: 1929/03/28  Visit Date: 03/13/2019  PCP: Excell Seltzer, MD  Referred by: Excell Seltzer, MD  No chief complaint on file.  Subjective:   Angela Ali is a 85 y.o. very pleasant female patient with There is no height or weight on file to calculate BMI. who presents with the following:  [name] is a very nice 85 year old lady who I have seen multiple times in the past.  Also know some of her family members.  She presents today with some ongoing knee pain.  I have seen her before, she has some known significant osteoarthritis.    Past Medical History, Surgical History, Social History, Family History, Problem List, Medications, and Allergies have been reviewed and updated if relevant.  Patient Active Problem List   Diagnosis Date Noted  . Sinus pressure 01/31/2019  . Bloating 06/10/2018  . Anxiety 04/29/2018  . Acute sinus infection 06/23/2017  . Post-nasal drip 06/08/2017  . Long term (current) use of anticoagulants 04/08/2017  . First degree burn of right forearm 02/19/2017  . Bulging lumbar disc 11/20/2016  . Obesity with serious comorbidity 11/25/2015  . Sacroiliac joint pain 09/26/2015  . Lipoma of back 06/06/2015  . Osteoarthritis of both knees 01/18/2015  . Counseling regarding end of life decision making 09/07/2014  . Encounter for therapeutic drug monitoring 06/05/2013  . Chronic diastolic CHF (congestive heart failure) (HCC) 03/21/2013  . Chronic cough 02/03/2013  . Recurrent boils 06/22/2012  . Chronic constipation 12/01/2011  . Splenic mass 08/21/2011  . Hx of Hematoma of leg, with chronic leg weakness and numbness 09/15/2010  . Moderate persistent asthma in adult without complication 07/03/2009  . FATIGUE,  CHRONIC 03/19/2009  . Vitamin D deficiency 02/26/2009  . LOW BACK PAIN, CHRONIC 11/19/2008  . Secondary cardiomyopathy (HCC) 10/02/2008  . Allergic rhinitis due to pollen 03/23/2008  . BENIGN NEOPLASM OTH&UNSPEC SITE DIGESTIVE SYSTEM 02/01/2007  . STRICTURE, ESOPHAGEAL 02/01/2007  . Diabetes mellitus type 2 with complications (HCC) 12/24/2006  . Osteoporosis 12/21/2006  . Hyperlipemia 12/16/2006  . Essential hypertension, benign 12/16/2006  . Atrial fibrillation (HCC) 12/16/2006  . Congestive heart failure (HCC) 12/16/2006  . GERD 12/16/2006  . HIATAL HERNIA 12/16/2006  . OSTEOARTHRITIS 12/16/2006  . KNEE PAIN, RIGHT, CHRONIC 12/16/2006  . HERNIATED LUMBAR DISC 12/16/2006  . PERIPHERAL EDEMA 12/16/2006  . COLONIC POLYPS, HYPERPLASTIC 08/26/2000  . DIVERTICULOSIS, COLON 08/26/2000    Past Medical History:  Diagnosis Date  . Allergic rhinitis, cause unspecified   . Atrial fibrillation (HCC)   . Atrial flutter (HCC)   . Bacterial pneumonia, unspecified   . Colonic polyp   . Congestive heart failure, unspecified   . Diabetes mellitus without complication (HCC)    borderline-checks sugars but no meds  . Diaphragmatic hernia without mention of obstruction or gangrene   . Displacement of lumbar intervertebral disc without myelopathy   . Diverticulosis of colon (without mention of hemorrhage)   . Edema   . Esophageal reflux   . Long term (current) use of anticoagulants   . Osteoarthrosis, unspecified whether generalized or localized, unspecified site   . Other abnormal glucose   . Other and unspecified hyperlipidemia   . Other malaise and fatigue   . Overweight(278.02)   .  Prediabetes   . Secondary cardiomyopathy, unspecified   . Splenic mass 08/21/2011  . Stricture and stenosis of esophagus   . Thigh abscess   . Unspecified essential hypertension     Past Surgical History:  Procedure Laterality Date  . ABDOMINAL HYSTERECTOMY  1971   partial  . ANKLE SURGERY Left 2001   . CHOLECYSTECTOMY  2004  . COLONOSCOPY    . ESOPHAGEAL DILATION  01/2007  . POLYPECTOMY    . spleen removed     2005 or 2006 in [place]    Social History   Socioeconomic History  . Marital status: Widowed    Spouse name: Not on file  . Number of children: 8  . Years of education: Not on file  . Highest education level: Not on file  Occupational History  . 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  Used to live in [place], [place], now in [place] with her family  Social Needs  . Financial resource strain: Not on file  . Food insecurity    Worry: Not on file    Inability: Not on file  . Transportation needs    Medical: Not on file    Non-medical: Not on file  Tobacco Use  . Smoking status: Never Smoker  . Smokeless tobacco: Never Used  Substance and Sexual Activity  . Alcohol use: No    Alcohol/week: 0.0 standard drinks  . Drug use: No  . Sexual activity: Never  Lifestyle  . Physical activity    Days per week: Not on file    Minutes per session: Not on file  . Stress: Not on file  Relationships  . Social Musician on phone: Not on file    Gets together: Not on file    Attends religious service: Not on file    Active member of club or organization: Not on file    Attends meetings of clubs or organizations: Not on file    Relationship status: Not on file  . Intimate partner violence    Fear of current or ex partner: Not on file    Emotionally abused: Not on file    Physically abused: Not on file    Forced sexual activity: Not on file  Other Topics Concern  . Not on file  Social History Narrative   Lost spouse to cancer in 07/2006   5 children   Does not get regular exercise    Family History  Problem Relation Age of Onset  . Emphysema Father 17  . Coronary artery disease Mother 40  . Hypertension Mother 90  . Ovarian cancer Daughter   . Bone cancer Son        bone marrow ca  . Hyperlipidemia Sister         # 1  . Hyperlipidemia Sister        # 2  . Diabetes Daughter        # 1  . Hypertension Daughter   . Diabetes Daughter        # 2   . Hypertension Daughter   . Colon cancer Neg Hx     Allergies  Allergen Reactions  . Morphine And Related   . Percocet [Oxycodone-Acetaminophen] Nausea Only       . Tramadol Nausea Only    Medication list reviewed and updated in full in [place] Link.  GEN: No fevers, chills. Nontoxic. Primarily MSK c/o today. MSK: Detailed  in the HPI GI: tolerating PO intake without difficulty Neuro: No numbness, parasthesias, or tingling associated. Otherwise the pertinent positives of the ROS are noted above.   Objective:   There were no vitals taken for this visit.  ***  Radiology: No results found.  Assessment and Plan:   ***

## 2019-03-13 ENCOUNTER — Ambulatory Visit: Payer: Medicare Other | Admitting: Family Medicine

## 2019-03-13 ENCOUNTER — Telehealth: Payer: Self-pay

## 2019-03-13 NOTE — Telephone Encounter (Signed)
[place] Night - Client Nonclinical Telephone Record AccessNurse Client [place] [facility] Night - Client Client Site [place] Physician Owens Loffler - MD Contact Type Call Who Is Calling Patient / Member / Family / Caregiver Caller Name Mushtaq Shramek Caller Phone Number [phone] Patient Name Angela Ali Patient DOB Nov 29, 1928 Call Type Message Only Information Provided Reason for Call Request to [place] Appointment Initial Comment Caller states she is needing to cancel her 11am appt for today due to no transportation. Additional Comment Daughter Angela Ali 11/18/1950 is also needing to cancel her appt as well. Call Closed By: Kelby Aline Transaction Date/Time: 03/13/2019 8:05:24 AM (ET)

## 2019-03-21 ENCOUNTER — Other Ambulatory Visit: Payer: Self-pay | Admitting: Family Medicine

## 2019-03-21 NOTE — Telephone Encounter (Signed)
Left message for Angela Ali to return my call in regards to the cyclobenzaprine prescription.

## 2019-03-21 NOTE — Telephone Encounter (Signed)
Flexeril is not on her current medication list.  Tizanidine is on medication list.  Please advise.

## 2019-03-21 NOTE — Telephone Encounter (Signed)
Pt called stating her sciatic is acting up on her and she has a old rx of  cyclobenzapr dated 01/04/2017 the bottle says disregard 01/04/2018. She wanted to know if she could take this med or can you call her in another rx  [address] [employer]

## 2019-03-21 NOTE — Telephone Encounter (Signed)
Okay to refill flexeril but she should no longer be taking tizanidine with it.

## 2019-03-22 MED ORDER — CYCLOBENZAPRINE HCL 5 MG PO TABS: 5.0000 mg | ORAL_TABLET | Freq: Every day | ORAL | 0 refills | Status: AC

## 2019-03-22 NOTE — Telephone Encounter (Signed)
Pt returned called. Came to see if you could talk but you were in a room. She said she will be home and will answer when you call her back.  CB [phone]

## 2019-03-22 NOTE — Telephone Encounter (Addendum)
Ms. Riesberg notified as instructed by telephone.  She will discard the Tizanidine  and change to Cyclobenzaprine.  Rx sent to [facility] as instructed by Dr. Diona Browner.  She did ask what else she could do to help her back.  I recommended low heat and she could also try to OTC 4% Lidocaine Patch.  She also complained about her sinuses.  I recommended that she continue with the Xyzal and Singulair.  I also recommended she get some nasal saline spray to [place] to irrigate her sinuses.  She wanted to know if Dr. Diona Browner would give her some more Prednisone.  I told her she would need an appointment before prednisone could be prescribed.  Patient states understanding.

## 2019-03-28 NOTE — Telephone Encounter (Signed)
noted 

## 2019-04-03 ENCOUNTER — Telehealth: Payer: Self-pay

## 2019-04-03 DIAGNOSIS — M170 Bilateral primary osteoarthritis of knee: Secondary | ICD-10-CM

## 2019-04-03 NOTE — Telephone Encounter (Signed)
Patient contacted the office Patient states that when she goes out, it is hard for her to walk on her own, and it's difficult for her to walk or stand for long periods of time. She is asking if Dr. Diona Browner can write an rx for a walker that has a seat. Please advise.

## 2019-04-04 NOTE — Addendum Note (Signed)
Addended byEliezer Lofts E on: 04/04/2019 12:04 PM   Modules accepted: Orders

## 2019-04-04 NOTE — Telephone Encounter (Signed)
[employer] for [facility] letting them know that Dr. Diona Browner has placed a DME order for a walker for Ms. Careaga.

## 2019-04-04 NOTE — Telephone Encounter (Signed)
DME order placed.

## 2019-04-05 ENCOUNTER — Other Ambulatory Visit: Payer: Self-pay

## 2019-04-06 ENCOUNTER — Telehealth: Payer: Self-pay | Admitting: Family Medicine

## 2019-04-06 ENCOUNTER — Ambulatory Visit (INDEPENDENT_AMBULATORY_CARE_PROVIDER_SITE_OTHER): Payer: Medicare Other | Admitting: General Practice

## 2019-04-06 DIAGNOSIS — Z7901 Long term (current) use of anticoagulants: Secondary | ICD-10-CM

## 2019-04-06 LAB — POCT INR: INR: 2.4 (ref 2.0–3.0)

## 2019-04-06 NOTE — Telephone Encounter (Signed)
Order for Walker with Seat was sent to [facility] on 04/04/2019.  They will be reaching out to patient once everything has been processed.

## 2019-04-06 NOTE — Telephone Encounter (Signed)
Angela Ali notified by telephone that order has been sent to [facility] and once they process the order, they will be in touch with her.  Patient states understanding.

## 2019-04-06 NOTE — Patient Instructions (Signed)
Pre visit review using our clinic review tool, if applicable. No additional management support is needed unless otherwise documented below in the visit note.  Continue taking 1/2 pill (2.5mg ) daily EXCEPT 5mg  on Tuesdays. Recheck in 6 weeks.

## 2019-04-06 NOTE — Telephone Encounter (Signed)
Patient's having back pain. Patient said it's been flaring up a lot and would like a rx for a walker w/ a seat.

## 2019-04-12 DIAGNOSIS — M810 Age-related osteoporosis without current pathological fracture: Secondary | ICD-10-CM | POA: Diagnosis not present

## 2019-04-12 DIAGNOSIS — M170 Bilateral primary osteoarthritis of knee: Secondary | ICD-10-CM | POA: Diagnosis not present

## 2019-04-12 DIAGNOSIS — R5383 Other fatigue: Secondary | ICD-10-CM | POA: Diagnosis not present

## 2019-04-12 DIAGNOSIS — R5381 Other malaise: Secondary | ICD-10-CM | POA: Diagnosis not present

## 2019-04-12 DIAGNOSIS — J4540 Moderate persistent asthma, uncomplicated: Secondary | ICD-10-CM | POA: Diagnosis not present

## 2019-04-27 DIAGNOSIS — H532 Diplopia: Secondary | ICD-10-CM | POA: Diagnosis not present

## 2019-04-27 LAB — HM DIABETES EYE EXAM

## 2019-05-02 ENCOUNTER — Ambulatory Visit (INDEPENDENT_AMBULATORY_CARE_PROVIDER_SITE_OTHER): Payer: Medicare Other

## 2019-05-02 ENCOUNTER — Telehealth: Payer: Self-pay | Admitting: Family Medicine

## 2019-05-02 ENCOUNTER — Other Ambulatory Visit (INDEPENDENT_AMBULATORY_CARE_PROVIDER_SITE_OTHER): Payer: Medicare Other

## 2019-05-02 ENCOUNTER — Ambulatory Visit: Payer: Medicare Other

## 2019-05-02 ENCOUNTER — Other Ambulatory Visit: Payer: Self-pay

## 2019-05-02 ENCOUNTER — Telehealth: Payer: Self-pay

## 2019-05-02 DIAGNOSIS — Z0000 Encounter for general adult medical examination without abnormal findings: Secondary | ICD-10-CM

## 2019-05-02 DIAGNOSIS — E118 Type 2 diabetes mellitus with unspecified complications: Secondary | ICD-10-CM | POA: Diagnosis not present

## 2019-05-02 DIAGNOSIS — E559 Vitamin D deficiency, unspecified: Secondary | ICD-10-CM

## 2019-05-02 LAB — LIPID PANEL
Cholesterol: 125 mg/dL (ref 0–200)
HDL: 42.5 mg/dL (ref >39.00)
LDL Cholesterol: 68 mg/dL (ref 0–99)
NonHDL: 82.7
Total CHOL/HDL Ratio: 3
Triglycerides: 76 mg/dL (ref 0.0–149.0)
VLDL: 15.2 mg/dL (ref 0.0–40.0)

## 2019-05-02 LAB — COMPREHENSIVE METABOLIC PANEL
ALT: 16 U/L (ref 0–35)
AST: 15 U/L (ref 0–37)
Albumin: 4.2 g/dL (ref 3.5–5.2)
Alkaline Phosphatase: 87 U/L (ref 39–117)
BUN: 13 mg/dL (ref 6–23)
CO2: 31 mEq/L (ref 19–32)
Calcium: 10 mg/dL (ref 8.4–10.5)
Chloride: 104 mEq/L (ref 96–112)
Creatinine, Ser: 0.88 mg/dL (ref 0.40–1.20)
GFR: 60.62 mL/min (ref >60.00)
Glucose, Bld: 110 mg/dL — ABNORMAL HIGH (ref 70–99)
Potassium: 3.8 mEq/L (ref 3.5–5.1)
Sodium: 142 mEq/L (ref 135–145)
Total Bilirubin: 0.6 mg/dL (ref 0.2–1.2)
Total Protein: 6.4 g/dL (ref 6.0–8.3)

## 2019-05-02 LAB — HEMOGLOBIN A1C: Hgb A1c MFr Bld: 5.9 % (ref 4.6–6.5)

## 2019-05-02 LAB — VITAMIN D 25 HYDROXY (VIT D DEFICIENCY, FRACTURES): VITD: 38.2 ng/mL (ref 30.00–100.00)

## 2019-05-02 NOTE — Telephone Encounter (Signed)
-----   Message from Cloyd Stagers, RT sent at 04/27/2019  9:26 AM EST ----- Regarding: Lab Orders for Tuesday 1.5.2021 Please place lab orders for Tuesday 1.5.2021, office visit for physical on Friday 1.8.2021 Thank you, Dyke Maes RT(R)

## 2019-05-02 NOTE — Patient Instructions (Signed)
Angela Ali , Thank you for taking time to come for your Medicare Wellness Visit. I appreciate your ongoing commitment to your health goals. Please review the following plan we discussed and let me know if I can assist you in the future.   Screening recommendations/referrals: Colonoscopy: no longer required Mammogram: Up to date, completed 02/17/2019 Bone Density: Up to date, completed 10/13/2016 Recommended yearly ophthalmology/optometry visit for glaucoma screening and checkup Recommended yearly dental visit for hygiene and checkup  Vaccinations: Influenza vaccine: decline Pneumococcal vaccine: Completed series Tdap vaccine: dcline Shingles vaccine: decline    Advanced directives: Advance directive discussed with you today. I have provided a copy for you to complete at home and have notarized. Once this is complete please bring a copy in to our office so we can scan it into your chart.  Conditions/risks identified: diabetes, hypertension, hyperlipidemia  Next appointment: 05/05/2019 @ 11:20 am    Preventive Care 65 Years and Older, Female Preventive care refers to lifestyle choices and visits with your health care provider that can promote health and wellness. What does preventive care include?  A yearly physical exam. This is also called an annual well check.  Dental exams once or twice a year.  Routine eye exams. Ask your health care provider how often you should have your eyes checked.  Personal lifestyle choices, including:  Daily care of your teeth and gums.  Regular physical activity.  Eating a healthy diet.  Avoiding tobacco and drug use.  Limiting alcohol use.  Practicing safe sex.  Taking low-dose aspirin every day.  Taking vitamin and mineral supplements as recommended by your health care provider. What happens during an annual well check? The services and screenings done by your health care provider during your annual well check will depend on your age,  overall health, lifestyle risk factors, and family history of disease. Counseling  Your health care provider may ask you questions about your:  Alcohol use.  Tobacco use.  Drug use.  Emotional well-being.  Home and relationship well-being.  Sexual activity.  Eating habits.  History of falls.  Memory and ability to understand (cognition).  Work and work Statistician.  Reproductive health. Screening  You may have the following tests or measurements:  Height, weight, and BMI.  Blood pressure.  Lipid and cholesterol levels. These may be checked every 5 years, or more frequently if you are over 1 years old.  Skin check.  Lung cancer screening. You may have this screening every year starting at age 25 if you have a 30-pack-year history of smoking and currently smoke or have quit within the past 15 years.  Fecal occult blood test (FOBT) of the stool. You may have this test every year starting at age 26.  Flexible sigmoidoscopy or colonoscopy. You may have a sigmoidoscopy every 5 years or a colonoscopy every 10 years starting at age 17.  Hepatitis C blood test.  Hepatitis B blood test.  Sexually transmitted disease (STD) testing.  Diabetes screening. This is done by checking your blood sugar (glucose) after you have not eaten for a while (fasting). You may have this done every 1-3 years.  Bone density scan. This is done to screen for osteoporosis. You may have this done starting at age 26.  Mammogram. This may be done every 1-2 years. Talk to your health care provider about how often you should have regular mammograms. Talk with your health care provider about your test results, treatment options, and if necessary, the need for more  tests. Vaccines  Your health care provider may recommend certain vaccines, such as:  Influenza vaccine. This is recommended every year.  Tetanus, diphtheria, and acellular pertussis (Tdap, Td) vaccine. You may need a Td booster every 10  years.  Zoster vaccine. You may need this after age 66.  Pneumococcal 13-valent conjugate (PCV13) vaccine. One dose is recommended after age 62.  Pneumococcal polysaccharide (PPSV23) vaccine. One dose is recommended after age 22. Talk to your health care provider about which screenings and vaccines you need and how often you need them. This information is not intended to replace advice given to you by your health care provider. Make sure you discuss any questions you have with your health care provider. Document Released: 05/10/2015 Document Revised: 01/01/2016 Document Reviewed: 02/12/2015 Elsevier Interactive Patient Education  2017 [place] Prevention in the Home Falls can cause injuries. They can happen to people of all ages. There are many things you can do to make your home safe and to help prevent falls. What can I do on the outside of my home?  Regularly fix the edges of walkways and driveways and fix any cracks.  Remove anything that might make you trip as you walk through a door, such as a raised step or threshold.  Trim any bushes or trees on the path to your home.  Use bright outdoor lighting.  Clear any walking paths of anything that might make someone trip, such as rocks or tools.  Regularly check to see if handrails are loose or broken. Make sure that both sides of any steps have handrails.  Any raised decks and porches should have guardrails on the edges.  Have any leaves, snow, or ice cleared regularly.  Use sand or salt on walking paths during winter.  Clean up any spills in your garage right away. This includes oil or grease spills. What can I do in the bathroom?  Use night lights.  Install grab bars by the toilet and in the tub and shower. Do not use towel bars as grab bars.  Use non-skid mats or decals in the tub or shower.  If you need to sit down in the shower, use a plastic, non-slip stool.  Keep the floor dry. Clean up any water that  spills on the floor as soon as it happens.  Remove soap buildup in the tub or shower regularly.  Attach bath mats securely with double-sided non-slip rug tape.  Do not have throw rugs and other things on the floor that can make you trip. What can I do in the bedroom?  Use night lights.  Make sure that you have a light by your bed that is easy to reach.  Do not use any sheets or blankets that are too big for your bed. They should not hang down onto the floor.  Have a firm chair that has side arms. You can use this for support while you get dressed.  Do not have throw rugs and other things on the floor that can make you trip. What can I do in the kitchen?  Clean up any spills right away.  Avoid walking on wet floors.  Keep items that you use a lot in easy-to-reach places.  If you need to reach something above you, use a strong step stool that has a grab bar.  Keep electrical cords out of the way.  Do not use floor polish or wax that makes floors slippery. If you must use wax, use non-skid floor  wax.  Do not have throw rugs and other things on the floor that can make you trip. What can I do with my stairs?  Do not leave any items on the stairs.  Make sure that there are handrails on both sides of the stairs and use them. Fix handrails that are broken or loose. Make sure that handrails are as long as the stairways.  Check any carpeting to make sure that it is firmly attached to the stairs. Fix any carpet that is loose or worn.  Avoid having throw rugs at the top or bottom of the stairs. If you do have throw rugs, attach them to the floor with carpet tape.  Make sure that you have a light switch at the top of the stairs and the bottom of the stairs. If you do not have them, ask someone to add them for you. What else can I do to help prevent falls?  Wear shoes that:  Do not have high heels.  Have rubber bottoms.  Are comfortable and fit you well.  Are closed at the  toe. Do not wear sandals.  If you use a stepladder:  Make sure that it is fully opened. Do not climb a closed stepladder.  Make sure that both sides of the stepladder are locked into place.  Ask someone to hold it for you, if possible.  Clearly mark and make sure that you can see:  Any grab bars or handrails.  First and last steps.  Where the edge of each step is.  Use tools that help you move around (mobility aids) if they are needed. These include:  Canes.  Walkers.  Scooters.  Crutches.  Turn on the lights when you go into a dark area. Replace any light bulbs as soon as they burn out.  Set up your furniture so you have a clear path. Avoid moving your furniture around.  If any of your floors are uneven, fix them.  If there are any pets around you, be aware of where they are.  Review your medicines with your doctor. Some medicines can make you feel dizzy. This can increase your chance of falling. Ask your doctor what other things that you can do to help prevent falls. This information is not intended to replace advice given to you by your health care provider. Make sure you discuss any questions you have with your health care provider. Document Released: 02/07/2009 Document Revised: 09/19/2015 Document Reviewed: 05/18/2014 Elsevier Interactive Patient Education  2017 [employer].

## 2019-05-02 NOTE — Progress Notes (Signed)
PCP notes:  Health Maintenance: Declined flu vaccine   Abnormal Screenings: MMSE- 16   Patient concerns: none   Nurse concerns: none   Next PCP appt.: 05/05/2019 @ 11:20 am

## 2019-05-02 NOTE — Telephone Encounter (Signed)
Called patient 3 times trying to complete Medicare Wellness visit. Patient never answered the phone. Left message on voicemail notifying patient that appointment has been cancelled and will be completed at upcoming physical.

## 2019-05-02 NOTE — Progress Notes (Signed)
Subjective:   Angela Ali is a 86 y.o. female who presents for Medicare Annual (Subsequent) preventive examination.  Review of Systems: N/A   This visit is being conducted through telemedicine via telephone at the nurse health advisor's home address due to the COVID-19 pandemic. This patient has given me verbal consent via doximity to conduct this visit, patient states they are participating from their home address. Patient and myself are on the telephone call. There is no referral for this visit. Some vital signs may be absent or patient reported.    Patient identification: identified by name, DOB, and current address   Cardiac Risk Factors include: advanced age (>41mn, >>33women);diabetes mellitus;dyslipidemia;hypertension     Objective:     Vitals: There were no vitals taken for this visit.  There is no height or weight on file to calculate BMI.  Advanced Directives 05/02/2019 04/29/2018 03/03/2017 02/22/2014 01/08/2012  Does Patient Have a Medical Advance Directive? No No No No Patient does not have advance directive  Would patient like information on creating a medical advance directive? Yes (MAU/Ambulatory/Procedural Areas - Information given) No - Patient declined Yes (MAU/Ambulatory/Procedural Areas - Information given) - -  Pre-existing out of facility DNR order (yellow form or pink MOST form) - - - - No    Tobacco Social History   Tobacco Use  Smoking Status Never Smoker  Smokeless Tobacco Never Used     Counseling given: Not Answered   Clinical Intake:  Pre-visit preparation completed: Yes  Pain : 0-10 Pain Score: 5  Pain Type: Chronic pain Pain Location: Leg Pain Orientation: Right, Left Pain Descriptors / Indicators: Aching Pain Onset: More than a month ago Pain Frequency: Intermittent     Nutritional Risks: None Diabetes: Yes CBG done?: No Did pt. bring in CBG monitor from home?: No  How often do you need to have someone help you when you read  instructions, pamphlets, or other written materials from your doctor or pharmacy?: 1 - Never What is the last grade level you completed in school?: 12th  Interpreter Needed?: No  Information entered by :: CJohnson, LPN  Past Medical History:  Diagnosis Date  . Allergic rhinitis, cause unspecified   . Atrial fibrillation ([place]   . Atrial flutter ([facility]   . Bacterial pneumonia, unspecified   . Colonic polyp   . Congestive heart failure, unspecified   . Diabetes mellitus without complication (HCC)    borderline-checks sugars but no meds  . Diaphragmatic hernia without mention of obstruction or gangrene   . Displacement of lumbar intervertebral disc without myelopathy   . Diverticulosis of colon (without mention of hemorrhage)   . Edema   . Esophageal reflux   . Long term (current) use of anticoagulants   . Osteoarthrosis, unspecified whether generalized or localized, unspecified site   . Other abnormal glucose   . Other and unspecified hyperlipidemia   . Other malaise and fatigue   . Overweight(278.02)   . Prediabetes   . Secondary cardiomyopathy, unspecified   . Splenic mass 08/21/2011  . Stricture and stenosis of esophagus   . Thigh abscess   . Unspecified essential hypertension    Past Surgical History:  Procedure Laterality Date  . ABDOMINAL HYSTERECTOMY  1971   partial  . ANKLE SURGERY Left 2001  . CHOLECYSTECTOMY  2004  . COLONOSCOPY    . ESOPHAGEAL DILATION  01/2007  . POLYPECTOMY    . spleen [place]  Family History  Problem Relation Age of Onset  . Emphysema Father 32  . Coronary artery disease Mother 42  . Hypertension Mother 85  . Ovarian cancer Daughter   . Bone cancer Son        bone marrow ca  . Hyperlipidemia Sister        # 1  . Hyperlipidemia Sister        # 2  . Diabetes Daughter        # 1  . Hypertension Daughter   . Diabetes Daughter        # 2   . Hypertension Daughter   . Colon cancer Neg Hx    Social  History   Socioeconomic History  . Marital status: Widowed    Spouse name: Not on file  . Number of children: 8  . Years of education: Not on file  . Highest education level: Not on file  Occupational History  . Occupation: Retired Product manager: RETIRED    Comment: retired Pharmacist, hospital.  Used to live in [place], [place], now in [place] with her family  Tobacco Use  . Smoking status: Never Smoker  . Smokeless tobacco: Never Used  Substance and Sexual Activity  . Alcohol use: No    Alcohol/week: 0.0 standard drinks  . Drug use: No  . Sexual activity: Never  Other Topics Concern  . Not on file  Social History Narrative   Lost spouse to cancer in 07/2006   5 children   Does not get regular exercise   Social Determinants of Health   Financial Resource Strain: Low Risk   . Difficulty of Paying Living Expenses: Not hard at all  Food Insecurity: No Food Insecurity  . Worried About Charity fundraiser in the Last Year: Never true  . Ran Out of Food in the Last Year: Never true  Transportation Needs: No Transportation Needs  . Lack of Transportation (Medical): No  . Lack of Transportation (Non-Medical): No  Physical Activity: Inactive  . Days of Exercise per Week: 0 days  . Minutes of Exercise per Session: 0 min  Stress: No Stress Concern Present  . Feeling of Stress : Only a little  Social Connections:   . Frequency of Communication with Friends and Family: Not on file  . Frequency of Social Gatherings with Friends and Family: Not on file  . Attends Religious Services: Not on file  . Active Member of Clubs or Organizations: Not on file  . Attends Archivist Meetings: Not on file  . Marital Status: Not on file    Outpatient Encounter Medications as of 05/02/2019  Medication Sig  . ACCU-CHEK FASTCLIX LANCETS MISC Use to check blood sugar 2 times a day.  Dx: E11.8  . ACCU-CHEK GUIDE test strip USE TO CHECK BLOOD SUGAR  TWO TIMES DAILY  . acetaminophen (TYLENOL  ARTHRITIS PAIN) 650 MG CR tablet Take 650 mg by mouth daily.  . Ascorbic Acid (VITAMIN C PO) Take 1 tablet by mouth daily.  Marland Kitchen atorvastatin (LIPITOR) 40 MG tablet Take 2 tablets (80 mg total) by mouth daily at 6 PM.  . benzonatate (TESSALON) 100 MG capsule TAKE 2 CAPSULES BY MOUTH TWICE DAILY AS NEEDED FOR COUGH  . Blood Glucose Monitoring Suppl (ACCU-CHEK GUIDE) w/Device KIT 1 each by Does not apply route 2 (two) times daily. Dx: E11.8  . Blood Pressure KIT Use daily as needed to check blood pressure.  . Cholecalciferol (VITAMIN D-3 PO) Take  1 tablet by mouth daily.  . cloNIDine (CATAPRES) 0.1 MG tablet TAKE ONE-HALF TABLET BY  MOUTH TWO TIMES DAILY  . cyclobenzaprine (FLEXERIL) 5 MG tablet Take 1 tablet (5 mg total) by mouth at bedtime.  . diclofenac sodium (VOLTAREN) 1 % GEL Apply 4 g topically 4 (four) times daily. 2 g to small joints and 4 grams to large joints  . digoxin (LANOXIN) 0.125 MG tablet TAKE 1 TABLET BY MOUTH  DAILY  . fexofenadine (ALLEGRA) 180 MG tablet Take 1 tablet (180 mg total) by mouth daily.  . Fluticasone-Salmeterol (ADVAIR) 250-50 MCG/DOSE AEPB Inhale 1 puff into the lungs 2 (two) times daily.  . furosemide (LASIX) 20 MG tablet Take 1 tablet (20 mg total) by mouth daily.  . hydrALAZINE (APRESOLINE) 50 MG tablet TAKE 1 TABLET BY MOUTH 3  TIMES DAILY  . hydrocortisone-pramoxine (ANALPRAM-HC) 2.5-1 % rectal cream Place 1 application rectally 3 (three) times daily.  Marland Kitchen ipratropium (ATROVENT) 0.03 % nasal spray Place 2 sprays into both nostrils every 12 (twelve) hours.  Marland Kitchen latanoprost (XALATAN) 0.005 % ophthalmic solution   . levocetirizine (XYZAL) 5 MG tablet Take 1 tablet (5 mg total) by mouth every evening.  Marland Kitchen losartan (COZAAR) 100 MG tablet TAKE 1 TABLET BY MOUTH  DAILY  . metoprolol succinate (TOPROL-XL) 25 MG 24 hr tablet TAKE 1 TABLET BY MOUTH  DAILY WITH OR IMMEDIATLEY  FOLLOWING A MEAL  . montelukast (SINGULAIR) 10 MG tablet Take 1 tablet (10 mg total) by mouth at  bedtime.  Marland Kitchen omeprazole (PRILOSEC) 20 MG capsule TAKE 1 CAPSULE BY MOUTH 2  TIMES DAILY BEFORE A MEAL.  Marland Kitchen traMADol (ULTRAM) 50 MG tablet Take 0.5-1 tablets (25-50 mg total) by mouth every 12 (twelve) hours as needed for moderate pain.  Marland Kitchen warfarin (COUMADIN) 5 MG tablet TAKE AS DIRECTED BY ANTICOAGULATION  CLINIC  . predniSONE (DELTASONE) 10 MG tablet 3 tabs by mouth daily x 3 days, then 2 tabs by mouth daily x 2 days then 1 tab by mouth daily x 2 days (Patient not taking: Reported on 05/02/2019)  . [DISCONTINUED] esomeprazole (NEXIUM) 40 MG capsule Take 40 mg by mouth daily.     No facility-administered encounter medications on file as of 05/02/2019.    Activities of Daily Living In your present state of health, do you have any difficulty performing the following activities: 05/02/2019  Hearing? N  Vision? N  Difficulty concentrating or making decisions? N  Walking or climbing stairs? N  Dressing or bathing? N  Doing errands, shopping? N  Preparing Food and eating ? N  Using the Toilet? N  In the past six months, have you accidently leaked urine? N  Do you have problems with loss of bowel control? N  Managing your Medications? N  Managing your Finances? N  Housekeeping or managing your Housekeeping? N  Some recent data might be hidden    Patient Care Team: Jinny Sanders, MD as PCP - General Dwain Sarna, [place] as Referring Physician (Optometry) Rockey Situ, Kathlene November, MD as Consulting Physician (Cardiology) Inocencio Homes, DPM as Consulting Physician (Podiatry)    Assessment:   This is a routine wellness examination for Breona.  Exercise Activities and Dietary recommendations Current Exercise Habits: The patient does not participate in regular exercise at present, Exercise limited by: None identified  Goals    . Increase water intake     Starting 04/29/2018, I will continue to drink at least 5-6 glasses of water daily.     Marland Kitchen  Patient Stated     05/02/2019, I will maintain and continue  medications as prescribed.        Fall Risk Fall Risk  05/02/2019 04/29/2018 03/03/2017 11/20/2016 10/10/2015  Falls in the past year? 0 0 Yes No No  Comment - - pt injured right side after falling in kitchen - -  Number falls in past yr: 0 - 1 - -  Injury with Fall? 0 - Yes - -  Risk for fall due to : Medication side effect - - - -  Follow up Falls evaluation completed;Falls prevention discussed - - - -   Is the patient's home free of loose throw rugs in walkways, pet beds, electrical cords, etc?   yes      Grab bars in the bathroom? no      Handrails on the stairs?   no      Adequate lighting?   yes  Timed Get Up and Go performed: N/A  Depression Screen PHQ 2/9 Scores 05/02/2019 04/29/2018 04/29/2018 03/03/2017  PHQ - 2 Score 0 2 0 0  PHQ- 9 Score 0 5 0 2     Cognitive Function MMSE - Mini Mental State Exam 05/02/2019 04/29/2018 03/03/2017  Orientation to time '5 5 5  '$ Orientation to Place '5 5 5  '$ Registration '3 3 3  '$ Attention/ Calculation 0 0 0  Recall '2 3 3  '$ Language- name 2 objects - 0 0  Language- repeat '1 1 1  '$ Language- follow 3 step command - 3 3  Language- read & follow direction - 0 0  Write a sentence - 0 0  Copy design - 0 0  Total score - 20 20  Mini Cog  Mini-Cog screen was completed. Maximum score is 22. A value of 0 denotes this part of the MMSE was not completed or the patient failed this part of the Mini-Cog screening.       Immunization History  Administered Date(s) Administered  . Influenza Split 02/27/2011, 02/18/2012  . Influenza Whole 01/12/2008, 01/31/2009, 03/05/2010  . Influenza,inj,Quad PF,6+ Mos 02/03/2013, 01/29/2014, 01/18/2015, 04/08/2017, 02/22/2018  . Pneumococcal Conjugate-13 08/31/2013  . Pneumococcal Polysaccharide-23 04/28/2007  . Td 02/26/2009    Qualifies for Shingles Vaccine? Yes  Screening Tests Health Maintenance  Topic Date Due  . OPHTHALMOLOGY EXAM  10/26/2018  . INFLUENZA VACCINE  11/26/2018  . TETANUS/TDAP  02/27/2019  . FOOT  EXAM  04/30/2019  . HEMOGLOBIN A1C  05/14/2019  . MAMMOGRAM  02/17/2020  . DEXA SCAN  Completed  . PNA vac Low Risk Adult  Completed    Cancer Screenings: Lung: Low Dose CT Chest recommended if Age 33-80 years, 30 pack-year currently smoking OR have quit w/in 15years. Patient does not qualify. Breast:  Up to date on Mammogram? Yes, completed 02/17/2019  Up to date of Bone Density/Dexa? Yes, completed 10/13/2016 Colorectal: no longer required  Additional Screenings:  Hepatitis C Screening: N/A     Plan:    Patient will maintain and continue medications as prescribed.    I have personally reviewed and noted the following in the patient's chart:   . Medical and social history . Use of alcohol, tobacco or illicit drugs  . Current medications and supplements . Functional ability and status . Nutritional status . Physical activity . Advanced directives . List of other physicians . Hospitalizations, surgeries, and ER visits in previous 12 months . Vitals . Screenings to include cognitive, depression, and falls . Referrals and appointments  In addition, I have  reviewed and discussed with patient certain preventive protocols, quality metrics, and best practice recommendations. A written personalized care plan for preventive services as well as general preventive health recommendations were provided to patient.     Andrez Grime, LPN  08/29/7320

## 2019-05-02 NOTE — Progress Notes (Signed)
No critical labs need to be addressed urgently. We will discuss labs in detail at upcoming office visit.   

## 2019-05-04 ENCOUNTER — Ambulatory Visit: Payer: Medicare Other

## 2019-05-05 ENCOUNTER — Ambulatory Visit: Payer: Medicare Other

## 2019-05-05 ENCOUNTER — Telehealth: Payer: Self-pay | Admitting: Family Medicine

## 2019-05-05 ENCOUNTER — Encounter: Payer: Self-pay | Admitting: Family Medicine

## 2019-05-05 ENCOUNTER — Encounter: Payer: Medicare Other | Admitting: Family Medicine

## 2019-05-05 NOTE — Telephone Encounter (Signed)
Patient has to cancel appt today due to the weather She would like to know if she could get a call with her lab results     She would also like to know if she can start taking ZINC

## 2019-05-05 NOTE — Telephone Encounter (Signed)
When addressing lab results.  Please let me know about Milira Helman (daughter) as well because she will ask when I call her on Monday.

## 2019-05-08 NOTE — Telephone Encounter (Signed)
Robin,  Can you please reschedule Ms. Stigler and [employer]'s CPE with Dr. Diona Browner.  Let Ms. Kain know she can take zinc and that Dr. Diona Browner prefers that they reschedule their appointments to discuss lab results.

## 2019-05-08 NOTE — Telephone Encounter (Signed)
Angela Ali notified that it is okay for her to take Zinc per Dr. Diona Browner.  I advised her that her labs looked good and that Dr. Diona Browner would go over them with her in detail at her appointment on 05/17/2018.  Patient states understanding.

## 2019-05-08 NOTE — Telephone Encounter (Signed)
She r/s this morning  1/21

## 2019-05-08 NOTE — Telephone Encounter (Signed)
Can take zinc.   I think they just need to reschedule.

## 2019-05-09 ENCOUNTER — Other Ambulatory Visit: Payer: Self-pay | Admitting: Family Medicine

## 2019-05-09 NOTE — Telephone Encounter (Signed)
Last coumadin check 04/06/2019 INR at 2.4.  Ok to refill?

## 2019-05-18 ENCOUNTER — Ambulatory Visit: Payer: Medicare Other

## 2019-05-18 ENCOUNTER — Telehealth: Payer: Self-pay

## 2019-05-18 ENCOUNTER — Other Ambulatory Visit: Payer: Self-pay

## 2019-05-18 ENCOUNTER — Ambulatory Visit (INDEPENDENT_AMBULATORY_CARE_PROVIDER_SITE_OTHER): Payer: Medicare Other | Admitting: General Practice

## 2019-05-18 ENCOUNTER — Ambulatory Visit (INDEPENDENT_AMBULATORY_CARE_PROVIDER_SITE_OTHER): Payer: Medicare Other | Admitting: Family Medicine

## 2019-05-18 ENCOUNTER — Encounter: Payer: Self-pay | Admitting: Family Medicine

## 2019-05-18 ENCOUNTER — Telehealth: Payer: Self-pay | Admitting: Family Medicine

## 2019-05-18 DIAGNOSIS — K5909 Other constipation: Secondary | ICD-10-CM

## 2019-05-18 DIAGNOSIS — E118 Type 2 diabetes mellitus with unspecified complications: Secondary | ICD-10-CM

## 2019-05-18 DIAGNOSIS — E785 Hyperlipidemia, unspecified: Secondary | ICD-10-CM

## 2019-05-18 DIAGNOSIS — I4821 Permanent atrial fibrillation: Secondary | ICD-10-CM | POA: Diagnosis not present

## 2019-05-18 DIAGNOSIS — Z7901 Long term (current) use of anticoagulants: Secondary | ICD-10-CM | POA: Diagnosis not present

## 2019-05-18 DIAGNOSIS — E1169 Type 2 diabetes mellitus with other specified complication: Secondary | ICD-10-CM

## 2019-05-18 DIAGNOSIS — I5032 Chronic diastolic (congestive) heart failure: Secondary | ICD-10-CM

## 2019-05-18 DIAGNOSIS — M5441 Lumbago with sciatica, right side: Secondary | ICD-10-CM

## 2019-05-18 DIAGNOSIS — I10 Essential (primary) hypertension: Secondary | ICD-10-CM

## 2019-05-18 DIAGNOSIS — Z0000 Encounter for general adult medical examination without abnormal findings: Secondary | ICD-10-CM

## 2019-05-18 DIAGNOSIS — R109 Unspecified abdominal pain: Secondary | ICD-10-CM

## 2019-05-18 DIAGNOSIS — E1159 Type 2 diabetes mellitus with other circulatory complications: Secondary | ICD-10-CM | POA: Diagnosis not present

## 2019-05-18 DIAGNOSIS — J4540 Moderate persistent asthma, uncomplicated: Secondary | ICD-10-CM

## 2019-05-18 DIAGNOSIS — I152 Hypertension secondary to endocrine disorders: Secondary | ICD-10-CM

## 2019-05-18 DIAGNOSIS — J3489 Other specified disorders of nose and nasal sinuses: Secondary | ICD-10-CM

## 2019-05-18 LAB — POCT INR: INR: 2.2 (ref 2.0–3.0)

## 2019-05-18 MED ORDER — FEXOFENADINE HCL 180 MG PO TABS: 180.0000 mg | ORAL_TABLET | Freq: Every day | ORAL | 3 refills | Status: DC

## 2019-05-18 MED ORDER — FEXOFENADINE HCL 180 MG PO TABS: 180.0000 mg | ORAL_TABLET | Freq: Every day | ORAL | 0 refills | Status: DC

## 2019-05-18 MED ORDER — MONTELUKAST SODIUM 10 MG PO TABS: 10.0000 mg | ORAL_TABLET | Freq: Every day | ORAL | 0 refills | Status: DC

## 2019-05-18 MED ORDER — MONTELUKAST SODIUM 10 MG PO TABS: 10.0000 mg | ORAL_TABLET | Freq: Every day | ORAL | 3 refills | Status: DC

## 2019-05-18 NOTE — Assessment & Plan Note (Signed)
Use tylenol 1000 mg 2 times daily.

## 2019-05-18 NOTE — Telephone Encounter (Signed)
We already discussed this... call to see if something has changed.  She needs to be seen in person if not improving with treatments we discussed.

## 2019-05-18 NOTE — Assessment & Plan Note (Addendum)
Poor control  At home on wrist cuff and eye MDs OV per pt on mulitdrug regimen.  We will have coumadin clinic verify BP when she goes today. She is taking a full tablet of clonidine twice daily per report, initial SE resolved.  Given complexity of regimen and in setting of AFib... we recommend elevation verified at a cardiology OV.. pt due for yearly eval. She will call to set this appt up.  If any diffficulty in arranging she will contact my office.

## 2019-05-18 NOTE — Assessment & Plan Note (Signed)
Stable on advair.  

## 2019-05-18 NOTE — Progress Notes (Signed)
Pt requested when in for coumadin clinic today for refills of singulair and allegra. She requested 30 day sent to [facility], [place] rest sent to [facility]. Advised these would be sent in. Pt appreciative

## 2019-05-18 NOTE — Assessment & Plan Note (Signed)
Diet controlled. Encouraged exercise and  healthy eating habits as tolerated.

## 2019-05-18 NOTE — Patient Instructions (Signed)
We are committed to keeping you informed about the COVID-19 vaccine.  As the vaccine continues to become available for each phase, we will ensure that patients who meet the criteria receive the information they need to access vaccination opportunities. Continue to check your MyChart account and [email or web address] for updates. Please review the Phase 1b information below.  Following [place]'s guidelines for the distribution of COVID-19 vaccines we are pleased to share our plans to begin offering vaccines to those 65 and older (Phase 1b). Here are details of those plans:  [facility] [place] On Tuesday, Jan. 19, the [place] [facility] ([facility]) and [place] begin large-scale COVID-19 vaccinations at the [facility]. The vaccinations are appointment only and for those 65 and older.  Walk-ins will not be accepted.  All appointments are currently filled. Please join our waiting list for the next available appointments. We will contact you when appointments become available. Please do not sign up more than once.  Join Our Waiting List   Other Vaccination Opportunities in Our Area We are also working in partnership with county health agencies in our service counties to ensure continuing vaccination availability in the weeks and months ahead. Learn more about each county's vaccination efforts in the website links below:   [place]  [place]  [place]  [place]  [place]   [place]'s phase 1b vaccination guidelines, prioritizing those 65 and over as the next eligible group to receive the COVID-19 vaccine, are detailed at [place].gov.   Vaccine Safety and Effectiveness Clinical trials for the Pfizer COVID-19 vaccine involved 42,000 people and showed that the vaccine is more than 95% effective in preventing COVID-19 with no serious safety concerns. Similar results have  been reported for the Moderna COVID-19 vaccine. Side effects reported in the Pfizer clinical trials include a sore arm at the injection site, fatigue, headache, chills and fever. While side effects from the Pfizer COVID-19 vaccine are higher than for a typical flu vaccine, they are lower in many ways than side effects from the leading vaccine to prevent shingles. Side effects are signs that a vaccine is working and are related to your immune system being stimulated to produce antibodies against infection. Side effects from vaccination are far less significant than health impacts from COVID-19.  Staying Informed Pharmacists, infectious disease doctors, critical care nurses and other experts at [facility] continue to speak publicly through media interviews and direct communication with our patients and communities about the safety, effectiveness and importance of vaccines to eliminate COVID-19. In addition, reliable information on vaccine safety, effectiveness, side effects and more is available on the following websites:  [place] Department of Health and Human Services COVID-19 Vaccine Information Website.  U.S. Centers for Disease Control and Prevention COVID-19 Vaccine Public Information Website.  Staying Safe We agree with the CDC on what we can do to help our communities get back to normal: Getting "back to normal" is going to take all of our tools. If we use all the tools we have, we stand the best chance of getting our families, communities, schools and workplaces "back to normal" sooner:  Get vaccinated as soon as vaccines become available within the phase of the state's vaccination rollout plan for which you meet the eligibility criteria.  Wear a mask.  Stay 6 feet from others and avoid crowds.  Wash hands often.  For our most current information, please visit [email or web address].  

## 2019-05-18 NOTE — Assessment & Plan Note (Signed)
Stable volume status per pt. Follow up with cardiology.

## 2019-05-18 NOTE — Patient Instructions (Addendum)
Pre visit review using our clinic review tool, if applicable. No additional management support is needed unless otherwise documented below in the visit note.  Continue taking 1/2 pill (2.5mg ) daily EXCEPT 5mg  on Tuesdays. Recheck in 6 weeks.

## 2019-05-18 NOTE — Assessment & Plan Note (Signed)
Start daily miralax for constipation.

## 2019-05-18 NOTE — Telephone Encounter (Signed)
Appears pt already had virtual appt today. FYI to [facility] CMA and Shirlean Mylar.

## 2019-05-18 NOTE — Assessment & Plan Note (Signed)
No urinary issues.  Maybe associated with constipation.. can try miralax.  If not improving .. consider in person exam.

## 2019-05-18 NOTE — Assessment & Plan Note (Signed)
LDL at goal < 70 on high dose statin.

## 2019-05-18 NOTE — Assessment & Plan Note (Addendum)
Per pt rate controlled on current regimen.  Compliant with coumadin .. due for INR/PT today. No concerning falls.  Follow up with cardiology.

## 2019-05-18 NOTE — Telephone Encounter (Signed)
Patient spoke with Dr Diona Browner this a/m virtually She stated that they discussed her side pain that she has been having.  Patient said its not a sharpe pain. And only occurs when she is bending over or leaning towards her left side.   She asked to speak with you but advised you were on lunch.

## 2019-05-18 NOTE — Telephone Encounter (Signed)
[facility] - Client Nonclinical Telephone Record AccessNurse Client [place] Night - Client Client Site [place] Physician Ria Bush - MD Contact Type Call Who Is Calling Patient / Member / Family / Caregiver Caller Name [place] Phone Number [phone] Patient Name Angela Ali Patient DOB 1928-11-22 Call Type Message Only Information Provided Reason for Call Request for General Office Information Initial Comment Caller states she is returning message to office may have been Robin. Additional Comment Declines triage,. Disp. Time Disposition Final User 05/18/2019 7:48:30 AM General Information Provided Yes Scharlene Corn Call Closed By: Scharlene Corn Transaction Date/Time: 05/18/2019 7:46:42 AM (ET)

## 2019-05-18 NOTE — Progress Notes (Addendum)
VIRTUAL VISIT Due to national recommendations of social distancing due to [place] 19, a virtual visit is felt to be most appropriate for this patient at this time.   I connected with the patient on 05/18/19 at  9:40 AM EST by virtual telehealth platform and verified that I am speaking with the correct person using two identifiers.   I discussed the limitations, risks, security and privacy concerns of performing an evaluation and management service by  virtual telehealth platform and the availability of in person appointments. I also discussed with the patient that there may be a patient responsible charge related to this service. The patient expressed understanding and agreed to proceed.  Patient location: Home Provider Location: [facility] Participants: Eliezer Lofts and SAMIKA VETSCH    CC: follow up  History of Present Illness: The patient presents for  review of chronic health problems. He/She also has the following acute concerns today: Left flank pain. Mild pulling.. off and on   Only notes when lying on that side or sitting to long.  occ constipation.Marland Kitchen off and on.  No dysuria. No urinary change No mass noted.  No numbness, no weakness.  No rash  The patient saw a LPN or RN for medicare wellness visit.  Prevention and wellness was reviewed in detail. Note reviewed and important notes copied below. Health Maintenance: Declined flu vaccine Abnormal Screenings: MMSE- 16  05/18/19  Diabetes:   Good control with diet Lab Results  Component Value Date   HGBA1C 5.9 05/02/2019  Using medications without difficulties: Hypoglycemic episodes: Hyperglycemic episodes: Feet problems:no ulcers Blood Sugars averaging: eye exam within last year: 03/2019  Hypertension:    Inadequate control at last check. High at eye doctor as well. On clonidine, losartan, hydralazine, metoprolol, digoxin and lasix prn. BP Readings from Last 3 Encounters:  01/31/19 (!) 160/85  06/10/18 (!)  162/84  05/16/18 (!) 148/91  Using medication without problems or lightheadedness: none Chest pain with exertion:none Edema: stable Short of breath: some  Average home BPs: Other issues:  Elevated Cholesterol:  Good control on statin. Lab Results  Component Value Date   CHOL 125 05/02/2019   HDL 42.50 05/02/2019   LDLCALC 68 05/02/2019   TRIG 76.0 05/02/2019   CHOLHDL 3 05/02/2019  Using medications without problems: Muscle aches:  Diet compliance: Exercise: Other complaints:   Afib permanent Followed by Cardiology.Marland Kitchen on coumadin, digoxin, metoprolol. Using lasix as needed.  Asthma, moderate persistent:  Stable control on advair.  Chronic low back pain: on tramadol prn pain but makes her groggy for 2 days after.    Memory loss: stable per pt.  COVID 19 screen No recent travel or known exposure to COVID19 The patient denies respiratory symptoms of COVID 19 at this time.  The importance of social distancing was discussed today.   Review of Systems  Constitutional: Positive for malaise/fatigue.  HENT: Positive for sinus pain.   Respiratory: Negative for cough, shortness of breath and wheezing.   Cardiovascular: Negative for chest pain and leg swelling.  Gastrointestinal: Positive for abdominal pain and constipation. Negative for diarrhea, nausea and vomiting.  Genitourinary: Positive for flank pain. Negative for dysuria, frequency, hematuria and urgency.  Musculoskeletal: Negative for myalgias.      Past Medical History:  Diagnosis Date  . Allergic rhinitis, cause unspecified   . Atrial fibrillation ([place])   . Atrial flutter ([place])   . Bacterial pneumonia, unspecified   . Colonic polyp   . Congestive heart failure, unspecified   .  Diabetes mellitus without complication (HCC)    borderline-checks sugars but no meds  . Diaphragmatic hernia without mention of obstruction or gangrene   . Displacement of lumbar intervertebral disc without myelopathy   . Diverticulosis  of colon (without mention of hemorrhage)   . Edema   . Esophageal reflux   . Long term (current) use of anticoagulants   . Osteoarthrosis, unspecified whether generalized or localized, unspecified site   . Other abnormal glucose   . Other and unspecified hyperlipidemia   . Other malaise and fatigue   . Overweight(278.02)   . Prediabetes   . Secondary cardiomyopathy, unspecified   . Splenic mass 08/21/2011  . Stricture and stenosis of esophagus   . Thigh abscess   . Unspecified essential hypertension     reports that she has never smoked. She has never used smokeless tobacco. She reports that she does not drink alcohol or use drugs.   Current Outpatient Medications:  .  ACCU-CHEK FASTCLIX LANCETS MISC, Use to check blood sugar 2 times a day.  Dx: E11.8, Disp: 204 each, Rfl: 3 .  ACCU-CHEK GUIDE test strip, USE TO CHECK BLOOD SUGAR  TWO TIMES DAILY, Disp: 100 each, Rfl: 5 .  acetaminophen (TYLENOL ARTHRITIS PAIN) 650 MG CR tablet, Take 650 mg by mouth daily., Disp: , Rfl:  .  Ascorbic Acid (VITAMIN C PO), Take 1 tablet by mouth daily., Disp: , Rfl:  .  atorvastatin (LIPITOR) 40 MG tablet, Take 2 tablets (80 mg total) by mouth daily at 6 PM., Disp: 180 tablet, Rfl: 3 .  benzonatate (TESSALON) 100 MG capsule, TAKE 2 CAPSULES BY MOUTH TWICE DAILY AS NEEDED FOR COUGH, Disp: 120 capsule, Rfl: 1 .  Blood Glucose Monitoring Suppl (ACCU-CHEK GUIDE) w/Device KIT, 1 each by Does not apply route 2 (two) times daily. Dx: E11.8, Disp: 1 kit, Rfl: 0 .  Cholecalciferol (VITAMIN D-3 PO), Take 1 tablet by mouth daily., Disp: , Rfl:  .  cloNIDine (CATAPRES) 0.1 MG tablet, TAKE ONE-HALF TABLET BY  MOUTH TWO TIMES DAILY, Disp: 90 tablet, Rfl: 0 .  cyclobenzaprine (FLEXERIL) 5 MG tablet, Take 1 tablet (5 mg total) by mouth at bedtime., Disp: 30 tablet, Rfl: 0 .  digoxin (LANOXIN) 0.125 MG tablet, TAKE 1 TABLET BY MOUTH  DAILY, Disp: 90 tablet, Rfl: 0 .  fexofenadine (ALLEGRA) 180 MG tablet, Take 1 tablet (180  mg total) by mouth daily., Disp: 90 tablet, Rfl: 3 .  fluticasone (FLONASE) 50 MCG/ACT nasal spray, , Disp: , Rfl:  .  Fluticasone-Salmeterol (ADVAIR) 250-50 MCG/DOSE AEPB, Inhale 1 puff into the lungs 2 (two) times daily., Disp: 3 each, Rfl: 3 .  furosemide (LASIX) 20 MG tablet, Take 1 tablet (20 mg total) by mouth daily., Disp: 90 tablet, Rfl: 3 .  hydrALAZINE (APRESOLINE) 50 MG tablet, TAKE 1 TABLET BY MOUTH 3  TIMES DAILY, Disp: 270 tablet, Rfl: 0 .  hydrocortisone-pramoxine (ANALPRAM-HC) 2.5-1 % rectal cream, Place 1 application rectally 3 (three) times daily., Disp: 30 g, Rfl: 0 .  latanoprost (XALATAN) 0.005 % ophthalmic solution, , Disp: , Rfl:  .  levocetirizine (XYZAL) 5 MG tablet, Take 1 tablet (5 mg total) by mouth every evening., Disp: 30 tablet, Rfl: 11 .  losartan (COZAAR) 100 MG tablet, TAKE 1 TABLET BY MOUTH  DAILY, Disp: 90 tablet, Rfl: 0 .  metoprolol succinate (TOPROL-XL) 25 MG 24 hr tablet, TAKE 1 TABLET BY MOUTH  DAILY WITH OR IMMEDIATLEY  FOLLOWING A MEAL, Disp: 90 tablet, Rfl: 0 .  montelukast (SINGULAIR) 10 MG tablet, Take 1 tablet (10 mg total) by mouth at bedtime., Disp: 90 tablet, Rfl: 3 .  omeprazole (PRILOSEC) 20 MG capsule, TAKE 1 CAPSULE BY MOUTH 2  TIMES DAILY BEFORE A MEAL., Disp: 180 capsule, Rfl: 0 .  warfarin (COUMADIN) 5 MG tablet, TAKE 1 TABLET(5MG) BY MOUTH TUESDAY AND SATURDAY,AND  1/2 TABLET( 2.5MG) ALL  OTHER DAYS AS DIRECTED BY  ANTICOAGULATION CLINIC, Disp: 58 tablet, Rfl: 3   Observations/Objective: There were no vitals taken for this visit.   Patient Care Team: Jinny Sanders, MD as PCP - General Dwain Sarna, [place] as Referring Physician (Optometry) Rockey Situ, Kathlene November, MD as Consulting Physician (Cardiology) Inocencio Homes, DPM as Consulting Physician (Podiatry)  Physical Exam  Physical Exam Constitutional:      General: The patient is not in acute distress. Pulmonary:     Effort: Pulmonary effort is normal. No respiratory distress.   Neurological:     Mental Status: The patient is alert and oriented to person, place, and time.  Psychiatric:        Mood and Affect: Mood normal.        Behavior: Behavior normal.   Assessment and Plan Left flank pain  No urinary issues.  Maybe associated with constipation.. can try miralax.  If not improving .. consider in person exam.  LOW BACK PAIN, CHRONIC  Use tylenol 1000 mg 2 times daily.  Chronic constipation  Start daily miralax for constipation.  Hypertension associated with diabetes ([place]) Poor control  At home on wrist cuff and eye MDs OV per pt on mulitdrug regimen.  We will have coumadin clinic verify BP when she goes today. She is taking a full tablet of clonidine twice daily per report, initial SE resolved.  Given complexity of regimen and in setting of AFib... we recommend elevation verified at a cardiology OV.. pt due for yearly eval. She will call to set this appt up.  If any diffficulty in arranging she will contact my office.   Hyperlipidemia associated with type 2 diabetes mellitus (HCC) LDL at goal < 70 on high dose statin.  Atrial fibrillation  Per pt rate controlled on current regimen.  Compliant with coumadin .. due for INR/PT today. No concerning falls.  Follow up with cardiology.  (HFpEF) heart failure with preserved ejection fraction (HCC)  Stable volume status per pt. Follow up with cardiology.  Moderate persistent asthma in adult without complication Stable on advair.  Diabetes mellitus type 2 with complications (HCC) Diet controlled. Encouraged exercise and  healthy eating habits as tolerated.      I discussed the assessment and treatment plan with the patient. The patient was provided an opportunity to ask questions and all were answered. The patient agreed with the plan and demonstrated an understanding of the instructions.   The patient was advised to call back or seek an in-person evaluation if the symptoms worsen or if the  condition fails to improve as anticipated.  Mammogram: not indicated given age  But pt prefers to continue.. 01/2019 colonoscopy not indicated given age  Vaccines: refused flu and Td otherwise uptodate.  DEXA: 09/2016 stable osteopenia  Eliezer Lofts, MD

## 2019-05-19 NOTE — Telephone Encounter (Signed)
Ms. [place] wanted to let us know that she started the Miralax and had a BM and the pain does seem to be a little better so she is thinking the pain was coming from constipation.  I advised her to continue using the Miralax until she starts having regular BMs.  She also stated she rubbed some Icy Hot on that side and that seem to help some as well.  FYI to Dr. Diona Browner.

## 2019-05-23 ENCOUNTER — Other Ambulatory Visit: Payer: Self-pay

## 2019-05-23 ENCOUNTER — Ambulatory Visit (INDEPENDENT_AMBULATORY_CARE_PROVIDER_SITE_OTHER): Payer: Medicare Other | Admitting: Family

## 2019-05-23 ENCOUNTER — Encounter: Payer: Self-pay | Admitting: Family

## 2019-05-23 VITALS — BP 140/80 | HR 77 | Ht 64.0 in | Wt 164.8 lb

## 2019-05-23 DIAGNOSIS — I4821 Permanent atrial fibrillation: Secondary | ICD-10-CM | POA: Diagnosis not present

## 2019-05-23 DIAGNOSIS — I5032 Chronic diastolic (congestive) heart failure: Secondary | ICD-10-CM | POA: Diagnosis not present

## 2019-05-23 DIAGNOSIS — I10 Essential (primary) hypertension: Secondary | ICD-10-CM

## 2019-05-23 DIAGNOSIS — Z7901 Long term (current) use of anticoagulants: Secondary | ICD-10-CM | POA: Diagnosis not present

## 2019-05-23 DIAGNOSIS — Z79899 Other long term (current) drug therapy: Secondary | ICD-10-CM

## 2019-05-23 MED ORDER — METOPROLOL SUCCINATE ER 25 MG PO TB24: ORAL_TABLET | ORAL | 3 refills | Status: AC

## 2019-05-23 NOTE — Patient Instructions (Addendum)
Medication Instructions:  Your physician has recommended you make the following change in your medication:   CHANGE your Metoprolol to 37.5mg  (1.5 tablet) daily  This beta blocker helps relax your heart and an increased dose may help you to have fewer episodes of those palptiations.  *If you need a refill on your cardiac medications before your next appointment, please call your pharmacy*  Lab Work: Your physician recommends that you have lab work today: digoxin level  If you have labs (blood work) drawn today and your tests are completely normal, you will receive your results only by: Marland Kitchen MyChart Message (if you have MyChart) OR . A paper copy in the mail If you have any lab test that is abnormal or we need to change your treatment, we will call you to review the results.  Testing/Procedures: You had an EKG today. It showed rate controlled with atrial fibrillation. This is a stable finding. It also showed PVCs which are early beats in the bottom chambers of your heart. These are not dangerous, but sometimes feel like your heart 'skips a beat'. The Metoprolol that you take will help with this.  Follow-Up: At [facility], you and your health needs are our priority.  As part of our continuing mission to provide you with exceptional heart care, we have created designated Provider Care Teams.  These Care Teams include your primary Cardiologist (physician) and Advanced Practice Providers (APPs -  Physician Assistants and Nurse Practitioners) who all work together to provide you with the care you need, when you need it.  Your next appointment:   6 month(s)  The format for your next appointment:   In Person  Provider:    You may see Ida Rogue, MD or one of the following Advanced Practice Providers on your designated Care Team:    Murray Hodgkins, NP  Christell Faith, PA-C  Marrianne Mood, PA-C   Other Instructions  We will be thinking of you and your search for a new  home!!  Would recommend you take your blood pressure cuff to your next office visit. That way we can make sure it is accurate. You can bring it to our office or to Dr. Rometta Emery office.

## 2019-05-23 NOTE — Progress Notes (Signed)
Office Visit    Patient Name: Angela Ali Date of Encounter: 05/23/2019  Primary Care Provider:  Jinny Sanders, MD Primary Cardiologist:  Ida Rogue, MD Electrophysiologist:  None   Chief Complaint    Angela Ali is a 86 y.o. female with a hx of chronic atrial fibrillation on warfarin, HTN, chronic lower extremity edema, shortness of breath, HLD, chronic diastolic heart failure presents today for follow-up of atrial fibrillation  Past Medical History    Past Medical History:  Diagnosis Date  . Allergic rhinitis, cause unspecified   . Atrial fibrillation ([place])   . Atrial flutter ([place])   . Bacterial pneumonia, unspecified   . Colonic polyp   . Congestive heart failure, unspecified   . Diabetes mellitus without complication (HCC)    borderline-checks sugars but no meds  . Diaphragmatic hernia without mention of obstruction or gangrene   . Displacement of lumbar intervertebral disc without myelopathy   . Diverticulosis of colon (without mention of hemorrhage)   . Edema   . Esophageal reflux   . Long term (current) use of anticoagulants   . Osteoarthrosis, unspecified whether generalized or localized, unspecified site   . Other abnormal glucose   . Other and unspecified hyperlipidemia   . Other malaise and fatigue   . Overweight(278.02)   . Prediabetes   . Secondary cardiomyopathy, unspecified   . Splenic mass 08/21/2011  . Stricture and stenosis of esophagus   . Thigh abscess   . Unspecified essential hypertension    Past Surgical History:  Procedure Laterality Date  . ABDOMINAL HYSTERECTOMY  1971   partial  . ANKLE SURGERY Left 2001  . CHOLECYSTECTOMY  2004  . COLONOSCOPY    . ESOPHAGEAL DILATION  01/2007  . POLYPECTOMY    . spleen removed     2005 or 2006 in [place]    Allergies  Allergies  Allergen Reactions  . Morphine And Related   . Percocet [Oxycodone-Acetaminophen] Nausea Only       . Tramadol Nausea Only    History of Present  Illness    Angela Ali is a 86 y.o. female with a hx of  chronic atrial fibrillation on warfarin, HTN, HLD, chronic diastolic heart failure, chronic lower extremity edema, shortness of breath last seen 12/25/2017 by Dr. Rockey Situ.  Her activity is limited by chronic back pain, hip pain, sciatica.  She has a remote history of previous ulcer of the RLE requiring care from wound clinic.  She is been intolerant diltiazem in the past secondary to edema.  Very pleasant lady. She reports intermittent palpitations when she is excited or worried. No pain, shortness of breath. Does endorse lots of recent stress. Has been looking for a new place to live. Is trying to find a condo with her daughter.   No chest pain, pressure, tightness. No SOB nor DOE. Does not routinely check her BP.   EKGs/Labs/Other Studies Reviewed:   The following studies were reviewed today:   EKG:  EKG is ordered today.  The ekg ordered today demonstrates rate controlled atrial fibrillation 77 bpm with left axis deviation and poor R wave progression and PVC.  Recent Labs: 05/02/2019: ALT 16; BUN 13; Creatinine, Ser 0.88; Potassium 3.8; Sodium 142  Recent Lipid Panel    Component Value Date/Time   CHOL 125 05/02/2019 0958   TRIG 76.0 05/02/2019 0958   HDL 42.50 05/02/2019 0958   CHOLHDL 3 05/02/2019 0958   VLDL 15.2 05/02/2019 0958  [place] 68 05/02/2019 0958    Home Medications   No outpatient medications have been marked as taking for the 05/23/19 encounter (Appointment) with Loel Dubonnet, NP.      Review of Systems       Review of Systems  Constitution: Negative for chills, fever and malaise/fatigue.  Cardiovascular: Negative for chest pain, dyspnea on exertion, leg swelling, near-syncope, orthopnea, palpitations and syncope.  Respiratory: Negative for cough, shortness of breath and wheezing.   Gastrointestinal: Negative for nausea and vomiting.  Neurological: Negative for dizziness, light-headedness and  weakness.  Psychiatric/Behavioral: The patient is nervous/anxious.    All other systems reviewed and are otherwise negative except as noted above.  Physical Exam    VS:  There were no vitals taken for this visit. , BMI There is no height or weight on file to calculate BMI. GEN: Well nourished, well developed, in no acute distress. HEENT: normal. Neck: Supple, no JVD, carotid bruits, or masses. Cardiac: irregularly irregular, no murmurs, rubs, or gallops. No clubbing, cyanosis, edema.  Radials/PT 2+ and equal bilaterally.  Respiratory:  Respirations regular and unlabored, clear to auscultation bilaterally. GI: Soft, nontender, nondistended, BS + x 4. MS: No deformity or atrophy. Skin: Warm and dry, no rash. Neuro:  Strength and sensation are intact. Psych: Normal affect.  Accessory Clinical Findings    ECG personally reviewed by me today - rate controlled atrial fibrillation with PVC, left axis deviation, and poor R wave progression.  - no acute changes.  Assessment & Plan    1. Chronic atrial fibrillation - Rate controlled today. Continue Digoxin 0.125 daily. She has PVC on EKG today and reports episodes of palpitations with stress, likely symptomatic PVC. Increase Metoprolol Succinate to 37.5 mg daily. Anticoagulated on warfarin.  2. PVC - Noted on EKG today. Describes intermittent palpitations consistent with PVC. Increase Metoprolol, as above. Discussed that her recent stress is likely contributory.  3. High risk medication use - Digoxin secondary to chronic atrial fibrillation. Check digoxin level today. No signs of toxicity. 4. Chronic anticoagulation - On Warfarin secondary to chronic anticoagulation. Follows with her PCP. Recent INRs therapeutic. Denies bleeding complications.  5. Chronic diastolic heart failure - Euvolemic and well compensated on exam. Continue Lasix 20mg  daily -  05/02/19 normal renal function.  6. HTN - BP elevated today. Increase Metoprolol, as above. If her BP  remains uncontrolled could consider transition from Metoprolol to Carvedilol. Continue Losartan 100mg  daily, Hydralazing 50mg  TID, Clonidine 0.05 BID.  7. HLD - Labs 05/02/19 with LDL 68, normal liver function. Continue Atorvastatin 80mg  daily.   Disposition: Follow up in 6 month(s) with Dr. Rockey Situ or APP.   Loel Dubonnet, NP 05/23/2019, 8:20 AM

## 2019-05-24 ENCOUNTER — Telehealth: Payer: Self-pay | Admitting: Family Medicine

## 2019-05-24 LAB — DIGOXIN LEVEL: Digoxin, Serum: 0.5 ng/mL (ref 0.5–0.9)

## 2019-05-24 NOTE — Telephone Encounter (Signed)
Pt states she went to cardiologist yesterday and he advised she ask her PCP about what she can take for heartburn other than omeprazole. She can be reached at [phone]

## 2019-05-25 NOTE — Telephone Encounter (Signed)
Left message for Ms. Goslin that she can take Pepcid AC one tablet twice daily as needed, in addition to the omeprazole 40 mg daily per Dr. Diona Browner.  I did advised that she can get the Pepcid AC OTC.

## 2019-05-25 NOTE — Telephone Encounter (Signed)
Can use pepcid AC  twice daily as needed in addition to the omeprazole 40 mg daily.

## 2019-05-31 ENCOUNTER — Other Ambulatory Visit: Payer: Self-pay

## 2019-05-31 ENCOUNTER — Ambulatory Visit: Payer: Medicare Other | Admitting: Podiatry

## 2019-05-31 DIAGNOSIS — B351 Tinea unguium: Secondary | ICD-10-CM | POA: Diagnosis not present

## 2019-05-31 DIAGNOSIS — M205X2 Other deformities of toe(s) (acquired), left foot: Secondary | ICD-10-CM

## 2019-05-31 DIAGNOSIS — M205X1 Other deformities of toe(s) (acquired), right foot: Secondary | ICD-10-CM

## 2019-05-31 DIAGNOSIS — M79609 Pain in unspecified limb: Secondary | ICD-10-CM

## 2019-05-31 DIAGNOSIS — E118 Type 2 diabetes mellitus with unspecified complications: Secondary | ICD-10-CM

## 2019-05-31 NOTE — Patient Instructions (Signed)
Diabetes Mellitus and Foot Care Foot care is an important part of your health, especially when you have diabetes. Diabetes may cause you to have problems because of poor blood flow (circulation) to your feet and legs, which can cause your skin to:  Become thinner and drier.  Break more easily.  Heal more slowly.  Peel and crack. You may also have nerve damage (neuropathy) in your legs and feet, causing decreased feeling in them. This means that you may not notice minor injuries to your feet that could lead to more serious problems. Noticing and addressing any potential problems early is the best way to prevent future foot problems. How to care for your feet Foot hygiene  Wash your feet daily with warm water and mild soap. Do not use hot water. Then, pat your feet and the areas between your toes until they are completely dry. Do not soak your feet as this can dry your skin.  Trim your toenails straight across. Do not dig under them or around the cuticle. File the edges of your nails with an emery board or nail file.  Apply a moisturizing lotion or petroleum jelly to the skin on your feet and to dry, brittle toenails. Use lotion that does not contain alcohol and is unscented. Do not apply lotion between your toes. Shoes and socks  Wear clean socks or stockings every day. Make sure they are not too tight. Do not wear knee-high stockings since they may decrease blood flow to your legs.  Wear shoes that fit properly and have enough cushioning. Always look in your shoes before you put them on to be sure there are no objects inside.  To break in new shoes, wear them for just a few hours a day. This prevents injuries on your feet. Wounds, scrapes, corns, and calluses  Check your feet daily for blisters, cuts, bruises, sores, and redness. If you cannot see the bottom of your feet, use a mirror or ask someone for help.  Do not cut corns or calluses or try to remove them with medicine.  If you  find a minor scrape, cut, or break in the skin on your feet, keep it and the skin around it clean and dry. You may clean these areas with mild soap and water. Do not clean the area with peroxide, alcohol, or iodine.  If you have a wound, scrape, corn, or callus on your foot, look at it several times a day to make sure it is healing and not infected. Check for: ? Redness, swelling, or pain. ? Fluid or blood. ? Warmth. ? Pus or a bad smell. General instructions  Do not cross your legs. This may decrease blood flow to your feet.  Do not use heating pads or hot water bottles on your feet. They may burn your skin. If you have lost feeling in your feet or legs, you may not know this is happening until it is too late.  Protect your feet from hot and cold by wearing shoes, such as at the beach or on hot pavement.  Schedule a complete foot exam at least once a year (annually) or more often if you have foot problems. If you have foot problems, report any cuts, sores, or bruises to your health care provider immediately. Contact a health care provider if:  You have a medical condition that increases your risk of infection and you have any cuts, sores, or bruises on your feet.  You have an injury that is not   healing.  You have redness on your legs or feet.  You feel burning or tingling in your legs or feet.  You have pain or cramps in your legs and feet.  Your legs or feet are numb.  Your feet always feel cold.  You have pain around a toenail. Get help right away if:  You have a wound, scrape, corn, or callus on your foot and: ? You have pain, swelling, or redness that gets worse. ? You have fluid or blood coming from the wound, scrape, corn, or callus. ? Your wound, scrape, corn, or callus feels warm to the touch. ? You have pus or a bad smell coming from the wound, scrape, corn, or callus. ? You have a fever. ? You have a red line going up your leg. Summary  Check your feet every day  for cuts, sores, red spots, swelling, and blisters.  Moisturize feet and legs daily.  Wear shoes that fit properly and have enough cushioning.  If you have foot problems, report any cuts, sores, or bruises to your health care provider immediately.  Schedule a complete foot exam at least once a year (annually) or more often if you have foot problems. This information is not intended to replace advice given to you by your health care provider. Make sure you discuss any questions you have with your health care provider. Document Revised: 01/04/2019 Document Reviewed: 05/15/2016 Elsevier Patient Education  2020 [employer]  

## 2019-06-04 ENCOUNTER — Encounter: Payer: Self-pay | Admitting: Podiatry

## 2019-06-04 NOTE — Progress Notes (Signed)
Subjective: Angela Ali presents today for follow up of preventative diabetic foot care and painful mycotic nails b/l that are difficult to trim. Pain interferes with ambulation. Aggravating factors include wearing enclosed shoe gear. Pain is relieved with periodic professional debridement.   Allergies  Allergen Reactions  . Morphine And Related   . Percocet [Oxycodone-Acetaminophen] Nausea Only       . Tramadol Nausea Only     Objective: There were no vitals filed for this visit.  Vascular Examination:  Capillary refill time to digits immediate b/l, palpable DP pulses b/l, palpable PT pulses b/l, pedal hair sparse b/l, skin temperature gradient within normal limits b/l, trace edema noted b/l feet and no pain with calf compression b/l  Dermatological Examination: Pedal skin with normal turgor, texture and tone bilaterally, no open wounds bilaterally, no interdigital macerations bilaterally and toenails 1-5 b/l elongated, dystrophic, thickened, crumbly with subungual debris  Musculoskeletal: Normal muscle strength 5/5 to all lower extremity muscle groups bilaterally, no pain crepitus or joint limitation noted with ROM b/l and mild claw toe deformity lesser digits b/l  Neurological: Protective sensation intact 5/5 intact bilaterally with 10g monofilament b/l and vibratory sensation decreased b/l  Assessment: Pain due to onychomycosis of nail  Acquired claw toe of both feet  Diabetes mellitus type 2 with complications (HCC)  Plan: Diabetic foot examination performed on today's visit. -Continue diabetic foot care principles. Literature dispensed on today.  -Toenails 1-5 b/l were debrided in length and girth without iatrogenic bleeding. -Patient to continue soft, supportive shoe gear daily. -Patient to report any pedal injuries to medical professional immediately. -Patient/POA to call should there be question/concern in the interim.  Return in about 3 months (around 08/28/2019)  for diabetic nail and callus trim/ Coumadin.

## 2019-06-05 ENCOUNTER — Telehealth: Payer: Self-pay | Admitting: Family Medicine

## 2019-06-05 NOTE — Telephone Encounter (Signed)
Pt called to request a prescription for prednisone to be sent to [facility] on [address]. She states she is having sinus problems and that Dr. Diona Browner is aware of her problems. She doesn't want to make an appointment unless she has to.

## 2019-06-06 ENCOUNTER — Other Ambulatory Visit: Payer: Self-pay | Admitting: Family Medicine

## 2019-06-06 MED ORDER — PREDNISONE 20 MG PO TABS: ORAL_TABLET | ORAL | 0 refills | Status: DC

## 2019-06-06 NOTE — Telephone Encounter (Signed)
Sent in rx for prednisone, but if not improving she needs to make a virtual visit for re-eval and possible COVID testing as appropriate.

## 2019-06-06 NOTE — Telephone Encounter (Signed)
Ms. Chock notified as instructed by telephone.  Patient states understanding.

## 2019-06-09 ENCOUNTER — Other Ambulatory Visit: Payer: Self-pay | Admitting: Family Medicine

## 2019-06-09 DIAGNOSIS — J3489 Other specified disorders of nose and nasal sinuses: Secondary | ICD-10-CM

## 2019-06-12 ENCOUNTER — Other Ambulatory Visit: Payer: Self-pay | Admitting: Cardiovascular Disease

## 2019-06-12 NOTE — Telephone Encounter (Signed)
Patient calling Would like to know if we can check the status of refill  Metoprolol succinate was sent to [facility] on 05/23/19 Patient has still not received and would like to know if she will need to have prescription filled another way or if she will receive soon  Please call to discuss

## 2019-06-13 NOTE — Telephone Encounter (Signed)
Contacted [facility]. The patients Rx for Metoprolol shipped on 06/12/19. The dosage was increased by Laurann Montana, NP on 05/23/19. [facility] was not ship it sooner because the patient received a shipment in Jan 2021 and per insurance it would have been to soon to ship.  DPR on file. lmom with the above update.

## 2019-06-27 ENCOUNTER — Telehealth: Payer: Self-pay | Admitting: *Deleted

## 2019-06-27 NOTE — Telephone Encounter (Signed)
Angela Ali notified as instructed by telephone.

## 2019-06-27 NOTE — Telephone Encounter (Signed)
Yes I think COVID vaccine indicated.

## 2019-06-27 NOTE — Telephone Encounter (Signed)
Called patient to confirm appointment for coumadin check Thursday. Patient requested that a note go back to Dr. Diona Browner to find out if she should get the covid vaccine? Patient stated that she has concerns because of her being on coumadin and her health history.

## 2019-06-29 ENCOUNTER — Ambulatory Visit (INDEPENDENT_AMBULATORY_CARE_PROVIDER_SITE_OTHER): Payer: Medicare Other

## 2019-06-29 ENCOUNTER — Other Ambulatory Visit: Payer: Self-pay

## 2019-06-29 DIAGNOSIS — Z7901 Long term (current) use of anticoagulants: Secondary | ICD-10-CM

## 2019-06-29 LAB — POCT INR: INR: 3.7 — AB (ref 2.0–3.0)

## 2019-06-29 NOTE — Patient Instructions (Addendum)
Pre visit review using our clinic review tool, if applicable. No additional management support is needed unless otherwise documented below in the visit note.  Hold dose today and change weekly dosing to 2.5mg  daily.  Recheck in 3 weeks.

## 2019-06-29 NOTE — Progress Notes (Signed)
Pt also asked that this nurse take her BP compared to her wrist BP machine. Her wrist cuff is not as accurate and had a higher reading than manual cuff.Advised pt to get an arm cuff. Pt said she would pick one up.

## 2019-06-30 ENCOUNTER — Telehealth: Payer: Self-pay

## 2019-06-30 ENCOUNTER — Other Ambulatory Visit: Payer: Self-pay | Admitting: Family Medicine

## 2019-06-30 NOTE — Telephone Encounter (Signed)
Pt left message on triage line asking if she can take tylenol Sinus for her sinus infection symptoms since she takes coumadin. Please advise at [phone].

## 2019-06-30 NOTE — Telephone Encounter (Addendum)
Angela Ali notified by telephone to not take tylenol sinus due to it has a decongestant in it and that can raise her BP, per Dr. Diona Browner.  I advised that she continue using her Flonase, Allegra, Xyzal and saline rinse.  Patient states understanding.

## 2019-07-10 ENCOUNTER — Telehealth: Payer: Self-pay | Admitting: Family Medicine

## 2019-07-10 ENCOUNTER — Telehealth: Payer: Self-pay

## 2019-07-10 NOTE — Telephone Encounter (Signed)
error 

## 2019-07-10 NOTE — Telephone Encounter (Signed)
She is scheduled for a hiatal hernia.  She will need to be seen in the office.

## 2019-07-10 NOTE — Telephone Encounter (Signed)
Pt had scheduled an appointment for tomorrow, but she is wondering if she can have something called in or if she could receive a call to discuss instead of her coming into the office. I advised her that most likely she would need an appointment, but she states she has discussed the issue before. Please advise.

## 2019-07-11 ENCOUNTER — Ambulatory Visit (INDEPENDENT_AMBULATORY_CARE_PROVIDER_SITE_OTHER): Payer: Medicare Other | Admitting: Family Medicine

## 2019-07-11 ENCOUNTER — Other Ambulatory Visit: Payer: Self-pay

## 2019-07-11 ENCOUNTER — Encounter: Payer: Self-pay | Admitting: Family Medicine

## 2019-07-11 VITALS — BP 171/88 | HR 73 | Temp 97.6°F | Ht 63.5 in | Wt 167.0 lb

## 2019-07-11 DIAGNOSIS — F419 Anxiety disorder, unspecified: Secondary | ICD-10-CM

## 2019-07-11 DIAGNOSIS — R140 Abdominal distension (gaseous): Secondary | ICD-10-CM

## 2019-07-11 MED ORDER — HYDROXYZINE HCL 10 MG PO TABS: 5.0000 mg | ORAL_TABLET | Freq: Two times a day (BID) | ORAL | 0 refills | Status: DC | PRN

## 2019-07-11 NOTE — Patient Instructions (Addendum)
Cut out dairy for now.  Stop omeprazole.  Can pepcid AC twice daily. Can use atarax as needed for anxiety. Call Dr. Kelby Fam office for follow up on upper abdominal bloating and fullness. [facility] [place],  [place]  [address] [address]: [phone]

## 2019-07-14 ENCOUNTER — Encounter: Payer: Self-pay | Admitting: Physician Assistant

## 2019-07-18 ENCOUNTER — Other Ambulatory Visit: Payer: Self-pay | Admitting: Family Medicine

## 2019-07-18 NOTE — Telephone Encounter (Signed)
Pharmacy is requesting 90 day supply.  Ok to change to #90?

## 2019-07-19 NOTE — Progress Notes (Signed)
Chief Complaint  Patient presents with  . Hiatal Hernia   History of Present Illness:  HPI  86 year old female presents for hiatal hernia.  Has noted and increase in gas and heaviness in upper stomach.  Pressure in upper abdomen.  Worse at night.  Feels over full in upper in abdomen.  Ongoing for years, but worse lately.  BMs usually normal.  Seems to be some worse after dairy.. has tried to decrease this.  Pantoprazole, omeprazole did not help, pepcid AC helps some.  GAS X , Tums doesn't help.  On omeprazole 40 mg daily.Marland Kitchen does not help much. No heartburn.  Using align.  Getting anxious at night.. has to use fans to get her more air.  She feels like upper abdomen feeling may be worse with anxiety.  This visit occurred during the SARS-CoV-2 public health emergency. Safety protocols were in place, including screening questions prior to the visit, additional usage of staff PPE, and extensive cleaning of exam room while observing appropriate contact time as indicated for disinfecting solutions.  COVID 19 screen: No recent travel or known exposure to COVID19  The patient denies respiratory symptoms of COVID 19 at this time.  The importance of social distancing was discussed today.  ROS      Past Medical History:  Diagnosis Date  . Allergic rhinitis, cause unspecified   . Atrial fibrillation ([place])   . Atrial flutter ([place])   . Bacterial pneumonia, unspecified   . Colonic polyp   . Congestive heart failure, unspecified   . Diabetes mellitus without complication (HCC)    borderline-checks sugars but no meds  . Diaphragmatic hernia without mention of obstruction or gangrene   . Displacement of lumbar intervertebral disc without myelopathy   . Diverticulosis of colon (without mention of hemorrhage)   . Edema   . Esophageal reflux   . Long term (current) use of anticoagulants   . Osteoarthrosis, unspecified whether generalized or localized, unspecified site   . Other abnormal glucose    . Other and unspecified hyperlipidemia   . Other malaise and fatigue   . Overweight(278.02)   . Prediabetes   . Secondary cardiomyopathy, unspecified   . Splenic mass 08/21/2011  . Stricture and stenosis of esophagus   . Thigh abscess   . Unspecified essential hypertension    reports that she has never smoked. She has never used smokeless tobacco. She reports that she does not drink alcohol or use drugs.   Current Outpatient Medications:  . ACCU-CHEK FASTCLIX LANCETS MISC, Use to check blood sugar 2 times a day. Dx: E11.8, Disp: 204 each, Rfl: 3  . ACCU-CHEK GUIDE test strip, USE TO CHECK BLOOD SUGAR TWO TIMES DAILY, Disp: 100 each, Rfl: 5  . acetaminophen (TYLENOL ARTHRITIS PAIN) 650 MG CR tablet, Take 650 mg by mouth daily., Disp: , Rfl:  . Ascorbic Acid (VITAMIN C PO), Take 1 tablet by mouth daily., Disp: , Rfl:  . atorvastatin (LIPITOR) 40 MG tablet, TAKE 2 TABLETS BY MOUTH DAILY AT 6 PM., Disp: 180 tablet, Rfl: 3  . benzonatate (TESSALON) 100 MG capsule, TAKE 2 CAPSULES BY MOUTH TWICE DAILY AS NEEDED FOR COUGH, Disp: 120 capsule, Rfl: 1  . Blood Glucose Monitoring Suppl (ACCU-CHEK GUIDE) w/Device KIT, 1 each by Does not apply route 2 (two) times daily. Dx: E11.8, Disp: 1 kit, Rfl: 0  . Cholecalciferol (VITAMIN D-3 PO), Take 1 tablet by mouth daily., Disp: , Rfl:  . cloNIDine (CATAPRES) 0.1 MG tablet, TAKE ONE-HALF  TABLET BY MOUTH TWICE DAILY, Disp: 90 tablet, Rfl: 3  . cyclobenzaprine (FLEXERIL) 5 MG tablet, Take 1 tablet (5 mg total) by mouth at bedtime., Disp: 30 tablet, Rfl: 0  . digoxin (LANOXIN) 0.125 MG tablet, TAKE 1 TABLET BY MOUTH DAILY, Disp: 90 tablet, Rfl: 3  . fexofenadine (ALLEGRA) 180 MG tablet, Take 1 tablet (180 mg total) by mouth daily., Disp: 90 tablet, Rfl: 3  . fluticasone (FLONASE) 50 MCG/ACT nasal spray, daily. , Disp: , Rfl:  . Fluticasone-Salmeterol (ADVAIR) 250-50 MCG/DOSE AEPB, Inhale 1 puff into the lungs 2 (two) times daily., Disp: 3 each, Rfl: 3  .  furosemide (LASIX) 20 MG tablet, Take 1 tablet (20 mg total) by mouth daily., Disp: 90 tablet, Rfl: 3  . hydrALAZINE (APRESOLINE) 50 MG tablet, TAKE 1 TABLET BY MOUTH 3 TIMES DAILY, Disp: 270 tablet, Rfl: 3  . hydrocortisone-pramoxine (ANALPRAM-HC) 2.5-1 % rectal cream, Place 1 application rectally 3 (three) times daily., Disp: 30 g, Rfl: 0  . latanoprost (XALATAN) 0.005 % ophthalmic solution, , Disp: , Rfl:  . levocetirizine (XYZAL) 5 MG tablet, Take 1 tablet (5 mg total) by mouth every evening., Disp: 30 tablet, Rfl: 11  . losartan (COZAAR) 100 MG tablet, TAKE 1 TABLET BY MOUTH DAILY, Disp: 90 tablet, Rfl: 3  . metoprolol succinate (TOPROL-XL) 25 MG 24 hr tablet, Take 1.5 tablets (37.5 mg) by mouth once daily, Disp: 135 tablet, Rfl: 3  . montelukast (SINGULAIR) 10 MG tablet, TAKE 1 TABLET BY MOUTH EVERYDAY AT BEDTIME, Disp: 30 tablet, Rfl: 11  . omeprazole (PRILOSEC) 20 MG capsule, TAKE 1 CAPSULE BY MOUTH 2 TIMES DAILY BEFORE A MEAL., Disp: 180 capsule, Rfl: 3  . predniSONE (DELTASONE) 20 MG tablet, 3 tabs by mouth daily x 3 days, then 2 tabs by mouth daily x 2 days then 1 tab by mouth daily x 2 days, Disp: 15 tablet, Rfl: 0  . warfarin (COUMADIN) 5 MG tablet, TAKE 1 TABLET(5MG) BY MOUTH TUESDAY AND SATURDAY,AND 1/2 TABLET( 2.5MG) ALL OTHER DAYS AS DIRECTED BY ANTICOAGULATION CLINIC, Disp: 58 tablet, Rfl: 3  Observations/Objective:  Blood pressure (!) 171/88, pulse 73, temperature 97.6 F (36.4 C), temperature source Temporal, height 5' 3.5" (1.613 m), weight 167 lb (75.8 kg), SpO2 97 %.  Physical Exam  BP (!) 171/88   Pulse 73   Temp 97.6 F (36.4 C) (Temporal)   Ht 5' 3.5" (1.613 m)   Wt 167 lb (75.8 kg)   SpO2 97%   BMI 29.12 kg/m   General Appearance:     Obese, Alert, cooperative, no distress, appears stated age     Eyes:    PERRL, conjunctiva/corneas clear, EOM's intact, fundi    benign, both eyes  Ears:    Normal TM's and external ear canals, both ears  Nose:   Nares normal,  septum midline, mucosa normal, no drainage    or sinus tenderness  Throat:   Lips, mucosa, and tongue normal;  Neck:   Supple, symmetrical, trachea midline, no adenopathy;    thyroid:  no enlargement/tenderness/nodules; no carotid   bruit or JVD  Back:     Symmetric, no curvature, ROM normal, no CVA tenderness  Lungs:     Clear to auscultation bilaterally, respirations unlabored  Chest Wall:    No tenderness or deformity   Heart:     irr irr rate and rhythm, S1 and S2 normal, no murmur, rub   or gallop  Breast Exam:    No tenderness, masses, or nipple  abnormality  Abdomen:     Soft, non-tender, bowel sounds active all four quadrants,    no masses, no organomegaly  Genitalia:    Normal female without lesion, discharge or tenderness  Rectal:    Normal tone, normal prostate, no masses or tenderness;   guaiac negative stool  Extremities:   Extremities with deformity at kneesno cyanosis or edema  Pulses:   2+ and symmetric all extremities  Skin:   Skin color, texture, turgor normal, no rashes or lesions  Lymph nodes:   Cervical, supraclavicular, and axillary nodes normal  Neurologic:   CNII-XII intact, normal strength, sensation and reflexes    throughout   Assessment and Plan   Bloating Her symptoms do not clearly seem to be from hiatal hernia, that is just how she refers to it.  Omeprazole does not seem to help.Marland Kitchen stop and try pepcid [facility] for some element of GERD.  May be lactose intolerance... cut out dairy.  Continue probiotic and constipation regimen.  If not improving follow up with GI for further eval.   Anxiety Abdominal symptoms seem closely related to stress and anxiety. Can try atarax prn panic feeling as long as no sedation or constipation symptoms.   Eliezer Lofts, MD

## 2019-07-19 NOTE — Assessment & Plan Note (Signed)
Abdominal symptoms seem closely related to stress and anxiety. Can try atarax prn panic feeling as long as no sedation or constipation symptoms.

## 2019-07-19 NOTE — Assessment & Plan Note (Addendum)
Her symptoms do not clearly seem to be from hiatal hernia, that is just how she refers to it. No current constipation or diarrhea.  Omeprazole does not seem to help.Marland Kitchen stop and try pepcid [facility] for some element of GERD.  May be lactose intolerance... cut out dairy.  Continue probiotic and constipation regimen.  If not improving follow up with GI for further eval.

## 2019-07-20 ENCOUNTER — Encounter: Payer: Self-pay | Admitting: Physician Assistant

## 2019-07-20 ENCOUNTER — Other Ambulatory Visit: Payer: Self-pay

## 2019-07-20 ENCOUNTER — Ambulatory Visit: Payer: Medicare Other

## 2019-07-20 ENCOUNTER — Ambulatory Visit (INDEPENDENT_AMBULATORY_CARE_PROVIDER_SITE_OTHER)
Admission: RE | Admit: 2019-07-20 | Discharge: 2019-07-20 | Disposition: A | Discharge status: Home / Self Care | Payer: Medicare Other | Source: Ambulatory Visit | Attending: Physician Assistant | Admitting: Physician Assistant

## 2019-07-20 ENCOUNTER — Ambulatory Visit: Payer: Medicare Other | Admitting: Physician Assistant

## 2019-07-20 ENCOUNTER — Other Ambulatory Visit (INDEPENDENT_AMBULATORY_CARE_PROVIDER_SITE_OTHER): Payer: Medicare Other

## 2019-07-20 VITALS — BP 178/94 | HR 90 | Temp 98.3°F | Ht 63.0 in | Wt 162.0 lb

## 2019-07-20 DIAGNOSIS — R0602 Shortness of breath: Secondary | ICD-10-CM | POA: Diagnosis not present

## 2019-07-20 DIAGNOSIS — R6881 Early satiety: Secondary | ICD-10-CM

## 2019-07-20 DIAGNOSIS — R1013 Epigastric pain: Secondary | ICD-10-CM

## 2019-07-20 DIAGNOSIS — R630 Anorexia: Secondary | ICD-10-CM | POA: Diagnosis not present

## 2019-07-20 LAB — CBC WITH DIFFERENTIAL/PLATELET
Basophils Absolute: 0.1 10*3/uL (ref 0.0–0.1)
Basophils Relative: 1.2 % (ref 0.0–3.0)
Eosinophils Absolute: 0.4 10*3/uL (ref 0.0–0.7)
Eosinophils Relative: 3.5 % (ref 0.0–5.0)
HCT: 37.6 % (ref 36.0–46.0)
Hemoglobin: 12.7 g/dL (ref 12.0–15.0)
Lymphocytes Relative: 18.7 % (ref 12.0–46.0)
Lymphs Abs: 2.3 10*3/uL (ref 0.7–4.0)
MCHC: 33.7 g/dL (ref 30.0–36.0)
MCV: 85.5 fl (ref 78.0–100.0)
Monocytes Absolute: 0.9 10*3/uL (ref 0.1–1.0)
Monocytes Relative: 7.1 % (ref 3.0–12.0)
Neutro Abs: 8.4 10*3/uL — ABNORMAL HIGH (ref 1.4–7.7)
Neutrophils Relative %: 69.5 % (ref 43.0–77.0)
Platelets: 174 10*3/uL (ref 150.0–400.0)
RBC: 4.4 Mil/uL (ref 3.87–5.11)
RDW: 17.1 % — ABNORMAL HIGH (ref 11.5–15.5)
WBC: 12.1 10*3/uL — ABNORMAL HIGH (ref 4.0–10.5)

## 2019-07-20 LAB — BASIC METABOLIC PANEL
BUN: 14 mg/dL (ref 6–23)
CO2: 31 mEq/L (ref 19–32)
Calcium: 10.5 mg/dL (ref 8.4–10.5)
Chloride: 104 mEq/L (ref 96–112)
Creatinine, Ser: 0.92 mg/dL (ref 0.40–1.20)
GFR: 57.56 mL/min — ABNORMAL LOW (ref >60.00)
Glucose, Bld: 108 mg/dL — ABNORMAL HIGH (ref 70–99)
Potassium: 3.9 mEq/L (ref 3.5–5.1)
Sodium: 141 mEq/L (ref 135–145)

## 2019-07-20 LAB — LIPASE: Lipase: 31 U/L (ref 11.0–59.0)

## 2019-07-20 LAB — BRAIN NATRIURETIC PEPTIDE: Pro B Natriuretic peptide (BNP): 412 pg/mL — ABNORMAL HIGH (ref 0.0–100.0)

## 2019-07-20 NOTE — Patient Instructions (Signed)
Your provider has requested that you go to the basement level for lab work before leaving today. Press "B" on the elevator. The lab is located at the first door on the left as you exit the elevator.  Continue Omeprazole '20mg'$  daily.  Increase Hydroxyzine to 1 tablet at bedtime.  Your provider has requested that you go to the basement level for an x ray before leaving today. Press "B" on the elevator.  X Ray is located at the first door on the right as you exit the elevator.  You have been scheduled for a CT scan of the abdomen and pelvis at [facility], 1st floor Radiology.  You are scheduled on 08/01/2019 at 2:00pm. You should arrive 15 minutes prior to your appointment time for registration. Please follow the written instructions below on the day of your exam:  WARNING: IF YOU ARE ALLERGIC TO IODINE/X-RAY DYE, PLEASE NOTIFY RADIOLOGY IMMEDIATELY AT [phone]! YOU WILL BE GIVEN A 13 HOUR PREMEDICATION PREP.  1) Do not eat anything after 10:00am (4 hours prior to your test) 2) You have been given 2 bottles of oral contrast to drink. The solution may taste better if refrigerated, but do NOT add ice or any other liquid to this solution. Shake well before drinking.    Drink 1 bottle of contrast @ 12:00pm (2 hours prior to your exam)  Drink 1 bottle of contrast @ 1:00pm (1 hour prior to your exam)  You may take any medications as prescribed with a small amount of water, if necessary. If you take any of the following medications: METFORMIN, GLUCOPHAGE, GLUCOVANCE, AVANDAMET, RIOMET, FORTAMET, [place] MET, JANUMET, GLUMETZA or METAGLIP, you MAY be asked to HOLD this medication 48 hours AFTER the exam.  The purpose of you drinking the oral contrast is to aid in the visualization of your intestinal tract. The contrast solution may cause some diarrhea. Depending on your individual set of symptoms, you may also receive an intravenous injection of x-ray contrast/dye. Plan on being at [facility]  for 30 minutes or longer, depending on the type of exam you are having performed.  This test typically takes 30-45 minutes to complete.  If you have any questions regarding your exam or if you need to reschedule, you may call [phone] between the hours of 8:00 am and 5:00 pm, Monday-Friday.  You have been scheduled for an appointment with Dr. Rockey Situ at [facility] in [place] on 07/24/2019 at 10:20am.  Please arrive at 10:00am  [id number], [facility] [address] [place], [place], [place] [address] Phone: [phone]

## 2019-07-20 NOTE — Progress Notes (Signed)
Subjective:    Patient ID: Angela Ali, female    DOB: [birth year], 86 y.o.   MRN: [id number]  HPI Angela Ali is a pleasant 86 year old African-American female, new to GI today, known remotely to Dr. Arlyce Dice and last seen in 2015.  She is referred by Dr. Kerby Nora for evaluation of upper abdominal bloating and discomfort which has been associated with shortness of breath.  It has been suggested this may be related to hiatal hernia.  She was recently started on omeprazole which has not offered any benefit. Patient has history of hypertension, cardiomyopathy with EF of 45 to 50% last echo 2017, atrial fibrillation, congestive heart failure, diverticulosis, and adenomatous colon polyps. She does not have any chronic GERD symptoms and currently denies any heartburn or indigestion or reflux symptoms.  She did have a EGD in 2009 with dilation of a distal esophageal stricture, hiatal hernia was not mentioned. Last colonoscopy 2009 with a 20 mm cecal polyp removed which was a tubovillous adenoma and also noted internal hemorrhoids and diverticulosis. Patient's current symptoms have been going on for the past several weeks with what she describes as epigastric pain and discomfort which is present most of the day associated with a full bloated feeling in the upper abdomen.  She says she is eating less because she seems to feel up more quickly.  She believes her weight has been stable over the past couple of months.  She denies any dysphagia or odynophagia and again no heartburn or indigestion. She is having worse problems at nighttime after she goes to bed and lies down she develops pressure in upper abdominal bloating that is associated with shortness of breath.  She says she has been unable to sleep for the past couple of weeks because of shortness of breath and a winded sensation.  She denies any chest pain.  She does have bilateral lower extremity edema which she says has been stable. She is not had any recent  imaging or labs.  She has been maintained on low-dose diuretic/Lasix 20 mg daily, and is on Coumadin for A. fib.  Review of Systems Pertinent positive and negative review of systems were noted in the above HPI section.  All other review of systems was otherwise negative.  Outpatient Encounter Medications as of 07/20/2019  Medication Sig  . ACCU-CHEK FASTCLIX LANCETS MISC Use to check blood sugar 2 times a day.  Dx: E11.8  . ACCU-CHEK GUIDE test strip USE TO CHECK BLOOD SUGAR  TWO TIMES DAILY  . acetaminophen (TYLENOL ARTHRITIS PAIN) 650 MG CR tablet Take 650 mg by mouth daily.  . Ascorbic Acid (VITAMIN C PO) Take 1 tablet by mouth daily.  Marland Kitchen atorvastatin (LIPITOR) 40 MG tablet TAKE 2 TABLETS BY MOUTH  DAILY AT 6 PM.  . benzonatate (TESSALON) 100 MG capsule TAKE 2 CAPSULES BY MOUTH TWICE DAILY AS NEEDED FOR COUGH  . Blood Glucose Monitoring Suppl (ACCU-CHEK GUIDE) w/Device KIT 1 each by Does not apply route 2 (two) times daily. Dx: E11.8  . Cholecalciferol (VITAMIN D-3 PO) Take 1 tablet by mouth daily.  . cloNIDine (CATAPRES) 0.1 MG tablet TAKE ONE-HALF TABLET BY  MOUTH TWICE DAILY  . cyclobenzaprine (FLEXERIL) 5 MG tablet Take 1 tablet (5 mg total) by mouth at bedtime.  . digoxin (LANOXIN) 0.125 MG tablet TAKE 1 TABLET BY MOUTH  DAILY  . fexofenadine (ALLEGRA) 180 MG tablet Take 1 tablet (180 mg total) by mouth daily.  . fluticasone (FLONASE) 50 MCG/ACT nasal spray daily.   Marland Kitchen  Fluticasone-Salmeterol (ADVAIR) 250-50 MCG/DOSE AEPB Inhale 1 puff into the lungs 2 (two) times daily.  . furosemide (LASIX) 20 MG tablet Take 1 tablet (20 mg total) by mouth daily.  . hydrALAZINE (APRESOLINE) 50 MG tablet TAKE 1 TABLET BY MOUTH 3  TIMES DAILY  . hydrocortisone-pramoxine (ANALPRAM-HC) 2.5-1 % rectal cream Place 1 application rectally 3 (three) times daily.  . hydrOXYzine (ATARAX/VISTARIL) 10 MG tablet TAKE 0.5-1 TABLETS (5-10 MG TOTAL) BY MOUTH 2 (TWO) TIMES DAILY AS NEEDED.  Marland Kitchen latanoprost (XALATAN) 0.005  % ophthalmic solution   . levocetirizine (XYZAL) 5 MG tablet Take 1 tablet (5 mg total) by mouth every evening.  Marland Kitchen losartan (COZAAR) 100 MG tablet TAKE 1 TABLET BY MOUTH  DAILY  . metoprolol succinate (TOPROL-XL) 25 MG 24 hr tablet Take 1.5 tablets (37.5 mg) by mouth once daily  . montelukast (SINGULAIR) 10 MG tablet TAKE 1 TABLET BY MOUTH EVERYDAY AT BEDTIME  . warfarin (COUMADIN) 5 MG tablet TAKE 1 TABLET(5MG ) BY MOUTH TUESDAY AND SATURDAY,AND  1/2 TABLET( 2.5MG ) ALL  OTHER DAYS AS DIRECTED BY  ANTICOAGULATION CLINIC  . [DISCONTINUED] predniSONE (DELTASONE) 20 MG tablet 3 tabs by mouth daily x 3 days, then 2 tabs by mouth daily x 2 days then 1 tab by mouth daily x 2 days  . [DISCONTINUED] esomeprazole (NEXIUM) 40 MG capsule Take 40 mg by mouth daily.     No facility-administered encounter medications on file as of 07/20/2019.   Allergies  Allergen Reactions  . Morphine And Related   . Percocet [Oxycodone-Acetaminophen] Nausea Only       . Tramadol Nausea Only   Patient Active Problem List   Diagnosis Date Noted  . Left flank pain 05/18/2019  . Bloating 06/10/2018  . Anxiety 04/29/2018  . Post-nasal drip 06/08/2017  . Long term (current) use of anticoagulants 04/08/2017  . First degree burn of right forearm 02/19/2017  . Bulging lumbar disc 11/20/2016  . Obesity with serious comorbidity 11/25/2015  . Sacroiliac joint pain 09/26/2015  . Lipoma of back 06/06/2015  . Osteoarthritis of both knees 01/18/2015  . Counseling regarding end of life decision making 09/07/2014  . Encounter for therapeutic drug monitoring 06/05/2013  . Chronic diastolic CHF (congestive heart failure) (HCC) 03/21/2013  . Chronic cough 02/03/2013  . Recurrent boils 06/22/2012  . Chronic constipation 12/01/2011  . Splenic mass 08/21/2011  . Hx of Hematoma of leg, with chronic leg weakness and numbness 09/15/2010  . Moderate persistent asthma in adult without complication 07/03/2009  . FATIGUE, CHRONIC  03/19/2009  . Vitamin D deficiency 02/26/2009  . LOW BACK PAIN, CHRONIC 11/19/2008  . Secondary cardiomyopathy (HCC) 10/02/2008  . Allergic rhinitis due to pollen 03/23/2008  . BENIGN NEOPLASM OTH&UNSPEC SITE DIGESTIVE SYSTEM 02/01/2007  . STRICTURE, ESOPHAGEAL 02/01/2007  . Diabetes mellitus type 2 with complications (HCC) 12/24/2006  . Osteoporosis 12/21/2006  . Hyperlipidemia associated with type 2 diabetes mellitus (HCC) 12/16/2006  . Hypertension associated with diabetes (HCC) 12/16/2006  . Atrial fibrillation (HCC) 12/16/2006  . (HFpEF) heart failure with preserved ejection fraction (HCC) 12/16/2006  . GERD 12/16/2006  . HIATAL HERNIA 12/16/2006  . OSTEOARTHRITIS 12/16/2006  . KNEE PAIN, RIGHT, CHRONIC 12/16/2006  . HERNIATED LUMBAR DISC 12/16/2006  . PERIPHERAL EDEMA 12/16/2006  . COLONIC POLYPS, HYPERPLASTIC 08/26/2000  . DIVERTICULOSIS, COLON 08/26/2000   Social History   Socioeconomic History  . Marital status: Widowed    Spouse name: Not on file  . Number of children: 8  . Years of education:  Not on file  . Highest education level: Not on file  Occupational History  . 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  Used to live in [place], [place], now in [place] with her family  Tobacco Use  . Smoking status: Never Smoker  . Smokeless tobacco: Never Used  Substance and Sexual Activity  . Alcohol use: No    Alcohol/week: 0.0 standard drinks  . Drug use: No  . Sexual activity: Never  Other Topics Concern  . Not on file  Social History Narrative   Lost spouse to cancer in 07/2006   5 children   Does not get regular exercise   Social Determinants of Health   Financial Resource Strain: Low Risk   . Difficulty of Paying Living Expenses: Not hard at all  Food Insecurity: No Food Insecurity  . Worried About Programme researcher, broadcasting/film/video in the Last Year: Never true  . Ran Out of Food in the Last Year: Never true  Transportation  Needs: No Transportation Needs  . Lack of Transportation (Medical): No  . Lack of Transportation (Non-Medical): No  Physical Activity: Inactive  . Days of Exercise per Week: 0 days  . Minutes of Exercise per Session: 0 min  Stress: No Stress Concern Present  . Feeling of Stress : Only a little  Social Connections:   . Frequency of Communication with Friends and Family:   . Frequency of Social Gatherings with Friends and Family:   . Attends Religious Services:   . Active Member of Clubs or Organizations:   . Attends Banker Meetings:   Marland Kitchen Marital Status:   Intimate Partner Violence: Not At Risk  . Fear of Current or Ex-Partner: No  . Emotionally Abused: No  . Physically Abused: No  . Sexually Abused: No    Ms. Medeiros's family history includes Bone cancer in her son; Coronary artery disease (age of onset: 49) in her mother; Diabetes in her daughter and daughter; Emphysema (age of onset: 42) in her father; Hyperlipidemia in her sister and sister; Hypertension in her daughter and daughter; Hypertension (age of onset: 79) in her mother; Ovarian cancer in her daughter.      Objective:    Vitals:   07/20/19 1439  BP: (!) 178/94  Pulse: 90  Temp: 98.3 F (36.8 C)    Physical Exam Well-developed well-nourished elderly African-American female in no acute distress.  Pleasant, accompanied by family member height, [id number], BMI 28.7  HEENT; nontraumatic normocephalic, EOMI, PE RR LA, sclera anicteric. Oropharynx; not examined Neck; supple, no JVD Cardiovascular; irregular rate and rhythm with S1-S2, systolic murmur Pulmonary; decreased breath sounds bilateral bases with few crackles Abdomen; soft, is tender in the epigastrium and hypogastrium, left lobe of the liver is palpable down about 3 finger breaths and somewhat irregular, nondistended, no palpable mass or hepatosplenomegaly, bowel sounds are active Rectal; not done today Skin; benign exam, no jaundice rash or  appreciable lesions Extremities; 2+ edema bilateral lower extremities to the upper shin Neuro/Psych; alert and oriented x4, grossly nonfocal mood and affect appropriate       Assessment & Plan:   #32 86 year old African-American female with history of congestive heart failure, cardiomyopathy and atrial fibrillation with new complaints of several week history of upper abdominal discomfort, fullness bloating some early satiety symptoms with consistent worsening of symptoms at nighttime after lying down when she is also been experiencing shortness of breath to the point that she has been  unable to rest  Am not certain she has an underlying GI issue, and concerned she may have volume overload/congestive heart failure precipitating current symptoms. Also need to consider underlying malignancy. Certainly all of the symptoms or not secondary to hiatal hernia.  #2 chronic anticoagulation/Coumadin #3.  History of adenomatous colon polyps last colonoscopy 2009 #4.  Diverticulosis  Plan; chest x-ray PA and lateral today  labs to include CBC with differential, c-Met, BNP. Schedule for CT scan of the abdomen and pelvis with contrast Continue omeprazole 20 mg p.o. daily for now Patient had been given an anxiolytic, hydroxyzine 0.5 to 1 tablet p.o. as needed for anxiety.  She has been taking a half a tab at nighttime, advised can increase to 1 at bedtime until we get current situation further sorted out. Advised to sleep in a upright position at least with head of bed elevated 60 degrees Have scheduled her a follow-up appointment with Dr. Gollan/cardiology for early next week.  May need further cardiac work-up/repeat echo, and diuresis.   Camree Wigington S Ladelle Teodoro PA-C 07/20/2019   Cc: Excell Seltzer, MD

## 2019-07-20 NOTE — Progress Notes (Signed)
Reviewed and agree with management plans. Agree with plans for contrasted cross-sectional imaging.   Shiree Altemus L. Tarri Glenn, MD, MPH

## 2019-07-21 ENCOUNTER — Ambulatory Visit: Payer: Medicare Other | Admitting: Physician Assistant

## 2019-07-23 NOTE — Progress Notes (Addendum)
Cardiology Office Note  Date:  07/24/2019   ID:  JEREE DELCID, DOB [birth year], MRN [id number]  PCP:  Jinny Sanders, MD   Chief Complaint  Patient presents with  . other    Michiel Cowboy, PA-C would like patient evaluated for shortness of breath and decreased appetite.     HPI:  Angela Ali is a very pleasant 86 year old woman with a history of  chronic atrial fibrillation on warfarin,  hypertension,  chronic lower extremity edema,  shortness of breath,  Echocardiogram 2011 ejection fraction 45 to 50% with mild aortic valve stenosis who presents for routine followup of her atrial fibrillation  And fatigue  In follow-up today she presents with family She reports having worsening lower extremity edema, abdominal bloating She does not walk very far, limited by orthopedic issues as detailed below On lasix 20 mg daily, rarely misses a dose Denies excess fluid intake Losing weight , "can't eat" Feels full in upper epigastric area, Leg swelling, pitting  has GI problem, feels that she has a "hiatal hernia" Seen by GI , CT ABD/pelvis ordered 08/01/19  Missing doses of hydralazine Likes to take all her medications once in the afternoon Previously taking clonidine only once a day  Prior office visits reported having chronic fatigue No regular exercise program Limited by chronic back pain and hip pain and sciatica  Lab work reviewed in detail with her AIC 6.1 Total chol 184, LDL 112  EKG personally reviewed by myself on todays visit Atrial fibrillation with ventricular rate 65 bpm nonspecific ST abnormality Left bundle branch block, new finding  Other past medical history reviewed Previous  fall with hematoma of the right lower extremity,  required care from the wound clinic for a large ulcerative wound on the right lateral aspect of her leg. This has healed well. History of medication confusion, some noncompliance. Prior trauma to her right lower extremity, surgery to her left  ankle  Echo 08/2009 EF 45 to 50%   Calcium channel blocker/diltiazem was held in the past secondary to edema.    PMH:   has a past medical history of Allergic rhinitis, cause unspecified, Atrial fibrillation ([place]), Atrial flutter ([place]), Bacterial pneumonia, unspecified, Colonic polyp, Congestive heart failure, unspecified, Diabetes mellitus without complication ([place]), Diaphragmatic hernia without mention of obstruction or gangrene, Displacement of lumbar intervertebral disc without myelopathy, Diverticulosis of colon (without mention of hemorrhage), Edema, Esophageal reflux, Long term (current) use of anticoagulants, Osteoarthrosis, unspecified whether generalized or localized, unspecified site, Other abnormal glucose, Other and unspecified hyperlipidemia, Other malaise and fatigue, Overweight(278.02), Prediabetes, Secondary cardiomyopathy, unspecified, Splenic mass (08/21/2011), Stricture and stenosis of esophagus, Thigh abscess, and Unspecified essential hypertension.  PSH:    Past Surgical History:  Procedure Laterality Date  . ABDOMINAL HYSTERECTOMY  1971   partial  . ANKLE SURGERY Left 2001  . CHOLECYSTECTOMY  2004  . COLONOSCOPY    . ESOPHAGEAL DILATION  01/2007  . POLYPECTOMY    . spleen removed     2005 or 2006 in [place]    Current Outpatient Medications  Medication Sig Dispense Refill  . ACCU-CHEK FASTCLIX LANCETS MISC Use to check blood sugar 2 times a day.  Dx: E11.8 204 each 3  . ACCU-CHEK GUIDE test strip USE TO CHECK BLOOD SUGAR  TWO TIMES DAILY 100 each 5  . acetaminophen (TYLENOL ARTHRITIS PAIN) 650 MG CR tablet Take 650 mg by mouth daily.    . Ascorbic Acid (VITAMIN C PO) Take 1 tablet by mouth daily.    Marland Kitchen  atorvastatin (LIPITOR) 40 MG tablet TAKE 2 TABLETS BY MOUTH  DAILY AT 6 PM. 180 tablet 3  . benzonatate (TESSALON) 100 MG capsule TAKE 2 CAPSULES BY MOUTH TWICE DAILY AS NEEDED FOR COUGH 120 capsule 1  . Blood Glucose Monitoring Suppl (ACCU-CHEK GUIDE) w/Device KIT  1 each by Does not apply route 2 (two) times daily. Dx: E11.8 1 kit 0  . Cholecalciferol (VITAMIN D-3 PO) Take 1 tablet by mouth daily.    . cloNIDine (CATAPRES) 0.1 MG tablet TAKE ONE-HALF TABLET BY  MOUTH TWICE DAILY 90 tablet 3  . cyclobenzaprine (FLEXERIL) 5 MG tablet Take 1 tablet (5 mg total) by mouth at bedtime. 30 tablet 0  . digoxin (LANOXIN) 0.125 MG tablet TAKE 1 TABLET BY MOUTH  DAILY 90 tablet 3  . fexofenadine (ALLEGRA) 180 MG tablet Take 1 tablet (180 mg total) by mouth daily. 90 tablet 3  . fluticasone (FLONASE) 50 MCG/ACT nasal spray daily.     . Fluticasone-Salmeterol (ADVAIR) 250-50 MCG/DOSE AEPB Inhale 1 puff into the lungs 2 (two) times daily. 3 each 3  . furosemide (LASIX) 20 MG tablet Take 1 tablet (20 mg total) by mouth daily. 90 tablet 3  . hydrALAZINE (APRESOLINE) 50 MG tablet TAKE 1 TABLET BY MOUTH 3  TIMES DAILY 270 tablet 3  . hydrocortisone-pramoxine (ANALPRAM-HC) 2.5-1 % rectal cream Place 1 application rectally 3 (three) times daily. 30 g 0  . hydrOXYzine (ATARAX/VISTARIL) 10 MG tablet TAKE 0.5-1 TABLETS (5-10 MG TOTAL) BY MOUTH 2 (TWO) TIMES DAILY AS NEEDED. 180 tablet 0  . latanoprost (XALATAN) 0.005 % ophthalmic solution     . levocetirizine (XYZAL) 5 MG tablet Take 1 tablet (5 mg total) by mouth every evening. 30 tablet 11  . losartan (COZAAR) 100 MG tablet TAKE 1 TABLET BY MOUTH  DAILY 90 tablet 3  . metoprolol succinate (TOPROL-XL) 25 MG 24 hr tablet Take 1.5 tablets (37.5 mg) by mouth once daily 135 tablet 3  . montelukast (SINGULAIR) 10 MG tablet TAKE 1 TABLET BY MOUTH EVERYDAY AT BEDTIME 30 tablet 11  . warfarin (COUMADIN) 5 MG tablet TAKE 1 TABLET('5MG'$ ) BY MOUTH TUESDAY AND SATURDAY,AND  1/2 TABLET( 2.'5MG'$ ) ALL  OTHER DAYS AS DIRECTED BY  ANTICOAGULATION CLINIC 58 tablet 3   No current facility-administered medications for this visit.     Allergies:   Morphine and related, Percocet [oxycodone-acetaminophen], and Tramadol   Social History:  The patient   reports that she has never smoked. She has never used smokeless tobacco. She reports that she does not drink alcohol or use drugs.   Family History:   family history includes Bone cancer in her son; Coronary artery disease (age of onset: 52) in her mother; Diabetes in her daughter and daughter; Emphysema (age of onset: 67) in her father; Hyperlipidemia in her sister and sister; Hypertension in her daughter and daughter; Hypertension (age of onset: 42) in her mother; Ovarian cancer in her daughter.    Review of Systems: Review of Systems  Constitutional: Positive for malaise/fatigue.  Respiratory: Positive for shortness of breath.   Cardiovascular: Positive for leg swelling.  Gastrointestinal: Negative.        Abdominal bloating  Musculoskeletal: Positive for back pain and joint pain.  Neurological: Negative.   Psychiatric/Behavioral: Negative.   All other systems reviewed and are negative.    PHYSICAL EXAM: VS:  BP (!) 148/80 (BP Location: Left Arm, Patient Position: Sitting, Cuff Size: Normal)   Pulse 65   Ht 5' 2.5" (1.588  m)   Wt 161 lb (73 kg)   SpO2 97%   BMI 28.98 kg/m  , BMI Body mass index is 28.98 kg/m. Constitutional:  oriented to person, place, and time. No distress.  Presenting in a wheelchair HENT:  Head: Grossly normal Eyes:  no discharge. No scleral icterus.  Neck: No JVD, no carotid bruits  Cardiovascular: Irregularly irregular no murmurs appreciated 1+ pitting lower extremity edema Pulmonary/Chest: Clear to auscultation bilaterally, no wheezes or rails Abdominal: Soft.  no distension.  no tenderness.  Musculoskeletal: Normal range of motion Neurological:  normal muscle tone. Coordination normal. No atrophy Skin: Skin warm and dry Psychiatric: normal affect, pleasant   Recent Labs: 05/02/2019: ALT 16 07/20/2019: BUN 14; Creatinine, Ser 0.92; Hemoglobin 12.7; Platelets 174.0; Potassium 3.9; Pro B Natriuretic peptide (BNP) 412.0; Sodium 141    Lipid  Panel Lab Results  Component Value Date   CHOL 125 05/02/2019   HDL 42.50 05/02/2019   LDLCALC 68 05/02/2019   TRIG 76.0 05/02/2019      Wt Readings from Last 3 Encounters:  07/24/19 161 lb (73 kg)  07/20/19 162 lb (73.5 kg)  07/11/19 167 lb (75.8 kg)       ASSESSMENT AND PLAN:  Atrial fibrillation, unspecified type ([place]) - Plan: EKG 12-Lead Chronic atrial fibrillation On metoprolol 37.5 mg daily,  higher dose she had bradycardia Tolerating anticoagulation, no falls  Chronic diastolic CHF (congestive heart failure) ([place]) - Plan: EKG 12-Lead Unable to tolerate compression hose for leg edema Likely component of dependent edema Echocardiogram has been ordered Recommend she stay on her Lasix daily  New left bundle branch block Discussed with patient in detail, possible etiology Stress testing ordered  Chronic fatigue Likely secondary to deconditioning Unable to walk,  deconditioned  Hypertension Blood pressure mildly elevated today, inconsistent with the medications, typically takes them once a day in the afternoon Previously reported taking clonidine once a day, hydralazine once a day  Hyperlipidemia Stressed importance of staying on her statin  Morbid obesity Stable weight recommended exercise program as tolerated with her back pain   Total encounter time more than 45 minutes  Greater than 50% was spent in counseling and coordination of care with the patient  Disposition:   F/U  3 months   Orders Placed This Encounter  Procedures  . EKG 12-Lead     Signed, Esmond Plants, M.D., Ph.D. 07/24/2019  [place], [place]

## 2019-07-24 ENCOUNTER — Encounter: Payer: Self-pay | Admitting: Cardiovascular Disease

## 2019-07-24 ENCOUNTER — Ambulatory Visit (INDEPENDENT_AMBULATORY_CARE_PROVIDER_SITE_OTHER): Payer: Medicare Other | Admitting: Cardiovascular Disease

## 2019-07-24 ENCOUNTER — Other Ambulatory Visit: Payer: Self-pay

## 2019-07-24 VITALS — BP 148/80 | HR 65 | Ht 62.5 in | Wt 161.0 lb

## 2019-07-24 DIAGNOSIS — I10 Essential (primary) hypertension: Secondary | ICD-10-CM

## 2019-07-24 DIAGNOSIS — E118 Type 2 diabetes mellitus with unspecified complications: Secondary | ICD-10-CM | POA: Diagnosis not present

## 2019-07-24 DIAGNOSIS — I4821 Permanent atrial fibrillation: Secondary | ICD-10-CM

## 2019-07-24 DIAGNOSIS — R0602 Shortness of breath: Secondary | ICD-10-CM

## 2019-07-24 DIAGNOSIS — E782 Mixed hyperlipidemia: Secondary | ICD-10-CM

## 2019-07-24 DIAGNOSIS — I5032 Chronic diastolic (congestive) heart failure: Secondary | ICD-10-CM | POA: Diagnosis not present

## 2019-07-24 DIAGNOSIS — I359 Nonrheumatic aortic valve disorder, unspecified: Secondary | ICD-10-CM

## 2019-07-24 DIAGNOSIS — Z7901 Long term (current) use of anticoagulants: Secondary | ICD-10-CM

## 2019-07-24 MED ORDER — FUROSEMIDE 40 MG PO TABS: 40.0000 mg | ORAL_TABLET | Freq: Every day | ORAL | 3 refills | Status: DC

## 2019-07-24 NOTE — Addendum Note (Signed)
Addended by: Minna Merritts on: 07/24/2019 01:03 PM   Modules accepted: Level of Service

## 2019-07-24 NOTE — Patient Instructions (Addendum)
Medication Instructions:  Lasix 40 mg daily  If you need a refill on your cardiac medications before your next appointment, please call your pharmacy.    Lab work: No new labs needed   If you have labs (blood work) drawn today and your tests are completely normal, you will receive your results only by: Marland Kitchen MyChart Message (if you have MyChart) OR . A paper copy in the mail If you have any lab test that is abnormal or we need to change your treatment, we will call you to review the results.   Testing/Procedures: Echo for shortness of breath, aortic valve disease Your physician has requested that you have an echocardiogram. Echocardiography is a painless test that uses sound waves to create images of your heart. It provides your doctor with information about the size and shape of your heart and how well your heart's chambers and valves are working. This procedure takes approximately one hour. There are no restrictions for this procedure.  lexiscan myoview for shortness of breath, new [facility] MYOVIEW  Your caregiver has ordered a Stress Test with nuclear imaging. The purpose of this test is to evaluate the blood supply to your heart muscle. This procedure is referred to as a "Non-Invasive Stress Test." This is because other than having an IV started in your vein, nothing is inserted or "invades" your body. Cardiac stress tests are done to find areas of poor blood flow to the heart by determining the extent of coronary artery disease (CAD). Some patients exercise on a treadmill, which naturally increases the blood flow to your heart, while others who are  unable to walk on a treadmill due to physical limitations have a pharmacologic/chemical stress agent called Lexiscan . This medicine will mimic walking on a treadmill by temporarily increasing your coronary blood flow.   Please note: these test may take anywhere between 2-4 hours to complete  PLEASE REPORT TO [facility] AT THE FIRST DESK WILL DIRECT YOU WHERE TO GO  Date of Procedure:_____________________________________  Arrival Time for Procedure:______________________________  Instructions regarding medication:   _XX___ : Hold diabetes medication morning of procedure  _XX___:  Hold Metoprolol the morning of procedure  _XX___:  Hold Furosemide the morning of your test  PLEASE NOTIFY THE OFFICE AT LEAST 24 HOURS IN ADVANCE IF YOU ARE UNABLE TO KEEP YOUR APPOINTMENT.  [phone] AND  PLEASE NOTIFY NUCLEAR MEDICINE AT [facility] AT LEAST 24 HOURS IN ADVANCE IF YOU ARE UNABLE TO KEEP YOUR APPOINTMENT. [phone]  How to prepare for your Myoview test:  1. Do not eat or drink after midnight 2. No caffeine for 24 hours prior to test 3. No smoking 24 hours prior to test. 4. Your medication may be taken with water.  If your doctor stopped a medication because of this test, do not take that medication. 5. Ladies, please do not wear dresses.  Skirts or pants are appropriate. Please wear a short sleeve shirt. 6. No perfume, cologne or lotion. 7. Wear comfortable walking shoes. No heels!   Follow-Up: At [facility], you and your health needs are our priority.  As part of our continuing mission to provide you with exceptional heart care, we have created designated Provider Care Teams.  These Care Teams include your primary Cardiologist (physician) and Advanced Practice Providers (APPs -  Physician Assistants and Nurse Practitioners) who all work together to provide you with the care you need, when you need it.  . You will need  a follow up appointment in 3 months   . Providers on your designated Care Team:   . Murray Hodgkins, NP . Christell Faith, PA-C . Marrianne Mood, PA-C  Any Other Special Instructions Will Be Listed Below (If Applicable).  For educational health videos Log in to : [email or web address] Or : [email or web address], password : [email or web address]

## 2019-07-25 ENCOUNTER — Ambulatory Visit: Payer: Medicare Other

## 2019-07-27 ENCOUNTER — Ambulatory Visit (INDEPENDENT_AMBULATORY_CARE_PROVIDER_SITE_OTHER): Payer: Medicare Other

## 2019-07-27 ENCOUNTER — Other Ambulatory Visit: Payer: Self-pay

## 2019-07-27 ENCOUNTER — Telehealth: Payer: Self-pay

## 2019-07-27 DIAGNOSIS — I359 Nonrheumatic aortic valve disorder, unspecified: Secondary | ICD-10-CM

## 2019-07-27 DIAGNOSIS — Z7901 Long term (current) use of anticoagulants: Secondary | ICD-10-CM | POA: Diagnosis not present

## 2019-07-27 DIAGNOSIS — R0602 Shortness of breath: Secondary | ICD-10-CM

## 2019-07-27 LAB — POCT INR: INR: 2.5 (ref 2.0–3.0)

## 2019-07-27 NOTE — Telephone Encounter (Signed)
Pt already had anti coag visit.

## 2019-07-27 NOTE — Telephone Encounter (Signed)
[facility] Night - Client Nonclinical Telephone Record AccessNurse Client [place] Night - Client Client Site [place] - Night Physician Eliezer Lofts - MD Contact Type Call Who Is Calling Patient / Member / Family / Caregiver Caller Name Marzetta Loft Caller Phone Number [phone] Patient Name Perel Alphin Patient DOB 11-13-1928 Call Type Message Only Information Provided Reason for Call Request for General Office Information Initial Comment Mackinsey Danzy thinks she missed her Coumadin. Should she come in for bloodwork? Additional Comment [facility] Can she come in after that? Disp. Time Disposition Final User 07/26/2019 7:02:08 PM General Information Provided Yes Tyler Deis Call Closed By: Tyler Deis Transaction Date/Time: 07/26/2019 6:57:56 PM (ET)

## 2019-07-27 NOTE — Telephone Encounter (Signed)
Pt already scheduled for coumadin clinic today at 11:30.

## 2019-07-27 NOTE — Patient Instructions (Addendum)
Pre visit review using our clinic review tool, if applicable. No additional management support is needed unless otherwise documented below in the visit note.  Continue taking 2.5mg  daily.  Recheck in 4 weeks.

## 2019-07-28 ENCOUNTER — Telehealth: Payer: Self-pay | Admitting: Cardiovascular Disease

## 2019-07-28 NOTE — Telephone Encounter (Signed)
Please call to discuss Hydralazine doseage.

## 2019-07-28 NOTE — Telephone Encounter (Signed)
I spoke with the patient regarding her hydralazine. She states she was notified by [facility] that she had a hydralazine 10 mg tablet at the pharmacy for pick up. She reports she told the pharmacy she had a 50 mg tablet of hydralazine at home. I advised her that Dr. Diona Browner sent in hydralazine 50 mg TID to [facility] on 06/30/19 and that Dr. Rockey Situ did not change her dose at her last visit with him on 07/24/19.  I have advised the patient she should remain on hydralazine 50 mg TID. The patient voices understanding and thinks [facility] had an old RX on file for her.

## 2019-07-31 ENCOUNTER — Telehealth: Payer: Self-pay | Admitting: *Deleted

## 2019-07-31 MED ORDER — POTASSIUM CHLORIDE CRYS ER 20 MEQ PO TBCR: 20.0000 meq | EXTENDED_RELEASE_TABLET | Freq: Two times a day (BID) | ORAL | 3 refills | Status: DC

## 2019-07-31 MED ORDER — FUROSEMIDE 40 MG PO TABS: 40.0000 mg | ORAL_TABLET | Freq: Two times a day (BID) | ORAL | 3 refills | Status: DC

## 2019-07-31 NOTE — Telephone Encounter (Signed)
Spoke with patient and reviewed provider recommendations to increase medications. She verbalized understanding read back information, also went to get pen and paper to write down. Also reviewed provider recommendations for follow up appointment in a couple of weeks. She requested that I call her daughter to schedule so she can provide transportation. Confirmed her name and number. Spoke with daughter [name] and reviewed provider recommendations and scheduled her for follow up on 08/15/19 at 09:00AM to see Dr. Rockey Situ. Advised that they should arrive early at the [facility] entrance and that they will show her how to get to our office. She confirmed appointment information and verbalized understanding of changes made. She had no further questions at this time.

## 2019-07-31 NOTE — Telephone Encounter (Signed)
-----   Message from Minna Merritts, MD sent at 07/30/2019  9:52 PM EDT ----- Echo with very high right side pressures, indicating fluids retention Would suggest we increase lasix to 40 BID, Cut back on fluids and salt intake Potassium 20 BID We should see her in clinic in 10 days

## 2019-08-01 ENCOUNTER — Ambulatory Visit (HOSPITAL_COMMUNITY)
Admission: RE | Admit: 2019-08-01 | Discharge: 2019-08-01 | Disposition: A | Discharge status: Home / Self Care | Payer: Medicare Other | Source: Ambulatory Visit | Attending: Physician Assistant | Admitting: Physician Assistant

## 2019-08-01 ENCOUNTER — Encounter (HOSPITAL_COMMUNITY): Payer: Self-pay

## 2019-08-01 ENCOUNTER — Other Ambulatory Visit: Payer: Self-pay

## 2019-08-01 DIAGNOSIS — R1013 Epigastric pain: Secondary | ICD-10-CM | POA: Insufficient documentation

## 2019-08-01 DIAGNOSIS — K7689 Other specified diseases of liver: Secondary | ICD-10-CM | POA: Diagnosis not present

## 2019-08-01 MED ORDER — IOHEXOL 300 MG/ML  SOLN
100.0000 mL | Freq: Once | INTRAMUSCULAR | Status: AC | PRN
  Administered 2019-08-01: 100 mL via INTRAVENOUS

## 2019-08-01 MED ORDER — SODIUM CHLORIDE (PF) 0.9 % IJ SOLN
INTRAMUSCULAR | Status: AC
  Filled 2019-08-01: qty 50

## 2019-08-03 ENCOUNTER — Telehealth: Payer: Self-pay | Admitting: Family Medicine

## 2019-08-03 ENCOUNTER — Telehealth: Payer: Self-pay | Admitting: Cardiovascular Disease

## 2019-08-03 NOTE — Telephone Encounter (Signed)
Patient called in today requesting a call back from Butch Penny She stated she is having a stress test done on Monday and she is very nervous on doing this. She stated she would really like to speak to her nurse or even the doctor before going in.  Patient was advised that they do the stress test differently now and it is causing her to be nervous and very concerned.

## 2019-08-03 NOTE — Telephone Encounter (Signed)
Appreciate Dr. Donivan Scull help.

## 2019-08-03 NOTE — Telephone Encounter (Signed)
Patient calling in wanting to talk through upcoming stress test  Please advise

## 2019-08-03 NOTE — Telephone Encounter (Addendum)
Dr. Lorelei Pont, Can you advise.  I do not know the first thing about a stress test.  It also looks like Dr. Donivan Scull nurse spoke with her at 3:15 pm today and gave reassurance.

## 2019-08-03 NOTE — Telephone Encounter (Signed)
Spoke with patient and she was nervous about having the stress test. Testing is scheduled for Monday and she is just unsure due to risks and side effects. Reviewed that this is a common test and that someone is there monitoring throughout testing. She seemed very hesitant but tried to reassure her that these are done on a daily basis. She said she would talk to her children and then if she wants to cancel she will call back tomorrow. Again provided emotional support and reassurance that this test is usually tolerated with little to no problems but that risks have to be reviewed regardless of each test. She reassured me that after talking with her children if she changes her mind she will let us know.

## 2019-08-04 ENCOUNTER — Telehealth: Payer: Self-pay | Admitting: Family Medicine

## 2019-08-04 ENCOUNTER — Other Ambulatory Visit: Payer: Self-pay

## 2019-08-04 MED ORDER — OMEPRAZOLE 20 MG PO CPDR: 20.0000 mg | DELAYED_RELEASE_CAPSULE | Freq: Every day | ORAL | 8 refills | Status: DC

## 2019-08-04 NOTE — Telephone Encounter (Signed)
Patient still having some hesitation and would like to speak with a nurse to go over some more questions. Patient is worrisome because this is new to her and she is nervous of how it may make her feel during and afterwards

## 2019-08-04 NOTE — Telephone Encounter (Signed)
Spoke with the patient. Patients questions regarding her upcoming lexiscan stress test answered to the best of my ability. Patient sts that she will proceed with testing as planned. Patient voiced appreciation for the call back. No further action required.

## 2019-08-04 NOTE — Telephone Encounter (Signed)
Spoke with Angela Ali and reassured her that the medication they give during a nuclear stress test is used all the time and if fine for her to get per Dr. Lorelei Pont.  I advised patient that Dr. Lorelei Pont said if it was his mother , he would want her to get the stress test done and would be mad if she didn't.  Patient was very reassured with that answer and will go do the stress test as scheduled.

## 2019-08-04 NOTE — Telephone Encounter (Signed)
Pt Wanted to make sure that Dr Diona Browner is aware of all the testing she is having coming up.   Pt is having or has already had the following: Stress test, Echo and CT scan at [facility] - all set up by Cardiology.   She is requesting a call back from Butch Penny to discuss the stress test coming up on Monday 4/12. She is concerned of the medication they are going to give her since she cannot do the treadmill portion.

## 2019-08-07 ENCOUNTER — Other Ambulatory Visit: Payer: Self-pay

## 2019-08-07 ENCOUNTER — Encounter
Admission: RE | Admit: 2019-08-07 | Discharge: 2019-08-07 | Disposition: A | Discharge status: Home / Self Care | Payer: Medicare Other | Source: Ambulatory Visit | Attending: Cardiovascular Disease | Admitting: Cardiovascular Disease

## 2019-08-07 DIAGNOSIS — R0602 Shortness of breath: Secondary | ICD-10-CM

## 2019-08-07 LAB — NM MYOCAR MULTI W/SPECT W/WALL MOTION / EF
LV dias vol: 73 mL (ref 46–106)
LV sys vol: 30 mL
Peak HR: 102 {beats}/min
Percent HR: 77 %
Rest HR: 69 {beats}/min
SDS: 0
SRS: 3
SSS: 0
TID: 0.9

## 2019-08-07 MED ORDER — TECHNETIUM TC 99M TETROFOSMIN IV KIT
10.0000 | PACK | Freq: Once | INTRAVENOUS | Status: AC | PRN
  Administered 2019-08-07: 09:00:00 10 via INTRAVENOUS

## 2019-08-07 MED ORDER — REGADENOSON 0.4 MG/5ML IV SOLN
0.4000 mg | Freq: Once | INTRAVENOUS | Status: AC
  Administered 2019-08-07: 0.4 mg via INTRAVENOUS

## 2019-08-07 MED ORDER — TECHNETIUM TC 99M TETROFOSMIN IV KIT
30.0000 | PACK | Freq: Once | INTRAVENOUS | Status: AC | PRN
  Administered 2019-08-07: 31.08 via INTRAVENOUS

## 2019-08-09 ENCOUNTER — Telehealth: Payer: Self-pay | Admitting: Physician Assistant

## 2019-08-09 NOTE — Telephone Encounter (Signed)
Patient with a negative CT scan. We started her on Omeprazole 20 mg daily. She calls back with an update. Her main complaint is unchanged. She reports she continues to feel full and bloated or "a tightness." Reports daily bowel movements

## 2019-08-10 ENCOUNTER — Telehealth: Payer: Self-pay | Admitting: Cardiovascular Disease

## 2019-08-10 NOTE — Telephone Encounter (Signed)
Lets schedule for a gastric emptying scan ,  and increase omeprazole 20 mg to BID,if  GE scan  is negative , then might need EGD

## 2019-08-10 NOTE — Telephone Encounter (Signed)
Patient calling to check in on results of stress test from 4/12

## 2019-08-11 ENCOUNTER — Other Ambulatory Visit: Payer: Self-pay

## 2019-08-11 DIAGNOSIS — R1013 Epigastric pain: Secondary | ICD-10-CM

## 2019-08-11 MED ORDER — OMEPRAZOLE 20 MG PO CPDR: 20.0000 mg | DELAYED_RELEASE_CAPSULE | Freq: Two times a day (BID) | ORAL | 8 refills | Status: DC

## 2019-08-11 NOTE — Telephone Encounter (Signed)
Spoke with patient and reviewed that results are pending provider review but preliminary findings showed normal. Let her know once provider reviews then we will give her a call with final results and if he should have any further recommendations. She verbalized understanding of our conversation with no further questions at this time.

## 2019-08-11 NOTE — Telephone Encounter (Signed)
Spoke with the patient. She is still in bed and unable to write down the date and time. I discussed the imaging test with her, explained what is done and what the test is for. Agreed to call her back later today after she has had time to be out of bed.

## 2019-08-14 ENCOUNTER — Telehealth: Payer: Self-pay | Admitting: *Deleted

## 2019-08-14 NOTE — Telephone Encounter (Signed)
Patient returning call to discuss results.

## 2019-08-14 NOTE — Telephone Encounter (Signed)
Patient is advised.  

## 2019-08-14 NOTE — Telephone Encounter (Signed)
Reviewed results with patient and she verbalized understanding. She does report continued issues with her hernia. Advised to be in touch with her PCP or GI specialist for further recommendations.

## 2019-08-14 NOTE — Telephone Encounter (Signed)
Left voicemail message to call back  

## 2019-08-14 NOTE — Telephone Encounter (Signed)
Gastric emptying scan 08/23/19 at 9:30 am. NPO after midnight. No stomach medications 8 hours prior. Go to [facility] Radiology.

## 2019-08-14 NOTE — Telephone Encounter (Signed)
-----   Message from Minna Merritts, MD sent at 08/13/2019 11:34 AM EDT ----- Stress test No ischemia Normal ejection fraction

## 2019-08-15 ENCOUNTER — Encounter: Payer: Self-pay | Admitting: Cardiovascular Disease

## 2019-08-15 ENCOUNTER — Ambulatory Visit: Payer: Medicare Other | Admitting: Cardiovascular Disease

## 2019-08-15 NOTE — Telephone Encounter (Signed)
Error

## 2019-08-23 ENCOUNTER — Encounter (HOSPITAL_COMMUNITY)
Admission: RE | Admit: 2019-08-23 | Discharge: 2019-08-23 | Disposition: A | Discharge status: Home / Self Care | Payer: Medicare Other | Source: Ambulatory Visit | Attending: Physician Assistant | Admitting: Physician Assistant

## 2019-08-23 ENCOUNTER — Other Ambulatory Visit: Payer: Self-pay

## 2019-08-23 DIAGNOSIS — R1013 Epigastric pain: Secondary | ICD-10-CM | POA: Diagnosis not present

## 2019-08-23 DIAGNOSIS — R140 Abdominal distension (gaseous): Secondary | ICD-10-CM | POA: Diagnosis not present

## 2019-08-23 DIAGNOSIS — K449 Diaphragmatic hernia without obstruction or gangrene: Secondary | ICD-10-CM | POA: Diagnosis not present

## 2019-08-23 MED ORDER — TECHNETIUM TC 99M SULFUR COLLOID
2.0000 | Freq: Once | INTRAVENOUS | Status: AC | PRN
  Administered 2019-08-23: 2 via INTRAVENOUS

## 2019-08-24 ENCOUNTER — Telehealth: Payer: Self-pay | Admitting: Physician Assistant

## 2019-08-24 ENCOUNTER — Telehealth: Payer: Self-pay

## 2019-08-24 ENCOUNTER — Ambulatory Visit (INDEPENDENT_AMBULATORY_CARE_PROVIDER_SITE_OTHER): Payer: Medicare Other

## 2019-08-24 DIAGNOSIS — Z7901 Long term (current) use of anticoagulants: Secondary | ICD-10-CM | POA: Diagnosis not present

## 2019-08-24 LAB — POCT INR: INR: 2.6 (ref 2.0–3.0)

## 2019-08-24 NOTE — Telephone Encounter (Signed)
Left message of normal study. See [facility] imaging report

## 2019-08-24 NOTE — Patient Instructions (Addendum)
Pre visit review using our clinic review tool, if applicable. No additional management support is needed unless otherwise documented below in the visit note.  Continue taking 2.5mg  daily.  Recheck in 5 weeks.

## 2019-08-24 NOTE — Telephone Encounter (Signed)
-----   Message from Alfredia Ferguson, PA-C sent at 08/24/2019 10:23 AM EDT ----- Please let the patient know that her gastric emptying scan was normal.

## 2019-08-24 NOTE — Telephone Encounter (Signed)
Patient continues to have her symptoms that she came in for in March. She has tried Omeprazole and presently is back on Pepcid. She says that neither medication change her symptoms.  She describes it as a fullness and she sometimes coughs. She feels full with and without eating. Please advise.

## 2019-08-28 NOTE — Telephone Encounter (Signed)
EGD would be next step . I believe she is established  with Dr Tarri Glenn. If pt wants to proceed with EGD , can schedule - if not then needs office visit - and would like her to see Dr Tarri Glenn as she has not met pt as yet

## 2019-08-28 NOTE — Telephone Encounter (Signed)
[name] I have her scheduled with Dr Tarri Glenn on 09/27/19. She has resumed the Pepcid. She asks if she could try Nexium in the meanwhile.

## 2019-08-29 ENCOUNTER — Other Ambulatory Visit: Payer: Self-pay

## 2019-08-29 MED ORDER — ESOMEPRAZOLE MAGNESIUM 40 MG PO CPDR: 40.0000 mg | DELAYED_RELEASE_CAPSULE | Freq: Every day | ORAL | 1 refills | Status: DC

## 2019-08-29 NOTE — Telephone Encounter (Signed)
It is fine for her to try Nexium 40 mg po QAm  - please send RX  #30 /2 - I am not sure that acid is her problem , that is why needs office visit - lets  also have her try FD gard OTC  take as directed which may help her dyspepsia/ Fullness feeling- can give her samples

## 2019-08-29 NOTE — Telephone Encounter (Signed)
Spoke with the patient. Confirmed her pharmacy. She wants to try the Nexium. Understands that this may not be an acid/dyspepsia problem.

## 2019-08-30 ENCOUNTER — Telehealth: Payer: Self-pay | Admitting: Family Medicine

## 2019-08-30 NOTE — Telephone Encounter (Signed)
Okay to take nexium as instructed by GI.

## 2019-08-30 NOTE — Telephone Encounter (Signed)
Patient called.  Patient wants to know if she can take Nexium?

## 2019-08-30 NOTE — Telephone Encounter (Signed)
Left message for Angela Ali that it is okay for her to take the Nexium as prescribed by GI.

## 2019-08-30 NOTE — Telephone Encounter (Signed)
Looks like GI changed her to Omeprazole to Nexium. Please advise.

## 2019-09-04 ENCOUNTER — Telehealth: Payer: Self-pay

## 2019-09-04 NOTE — Telephone Encounter (Signed)
Patient contacted the office and states that since Saturday, she has been having lots of nasal congestion and feels lots of pressure in her head. She states she feels like she has lots of congestion in her nose, but she cannot blow it out. Patient states when she is able to blow her nose, it is yellow/green in color. Patient denies fever, shortness of breath, chest pain.  Patient states she would like an antibiotic. I advised she would most likely need an OV before an antibiotic can be sent in, but I would send a message to Dr. Diona Browner to see what she advises.

## 2019-09-05 NOTE — Telephone Encounter (Signed)
Set up for virtual OV.

## 2019-09-05 NOTE — Telephone Encounter (Signed)
Patient called. Schedule Virtual visit for Thursday

## 2019-09-06 ENCOUNTER — Telehealth: Payer: Medicare Other | Admitting: Cardiovascular Disease

## 2019-09-07 ENCOUNTER — Telehealth (INDEPENDENT_AMBULATORY_CARE_PROVIDER_SITE_OTHER): Payer: Medicare Other | Admitting: Family Medicine

## 2019-09-07 VITALS — BP 161/90 | Temp 96.7°F

## 2019-09-07 DIAGNOSIS — F419 Anxiety disorder, unspecified: Secondary | ICD-10-CM | POA: Diagnosis not present

## 2019-09-07 DIAGNOSIS — R0602 Shortness of breath: Secondary | ICD-10-CM | POA: Insufficient documentation

## 2019-09-07 DIAGNOSIS — I5032 Chronic diastolic (congestive) heart failure: Secondary | ICD-10-CM

## 2019-09-07 DIAGNOSIS — K449 Diaphragmatic hernia without obstruction or gangrene: Secondary | ICD-10-CM

## 2019-09-07 DIAGNOSIS — R140 Abdominal distension (gaseous): Secondary | ICD-10-CM | POA: Diagnosis not present

## 2019-09-07 MED ORDER — AZELASTINE HCL 0.1 % NA SOLN: 2.0000 | Freq: Two times a day (BID) | NASAL | 12 refills | Status: AC

## 2019-09-07 NOTE — Assessment & Plan Note (Signed)
Only very small hiatal hernia.Marland Kitchen doubt full causing all symptoms as she thinks. Neg GI work up and nml emptying.   May be lactose intolerance.Marland Kitchen avoid cheese and milk.

## 2019-09-07 NOTE — Assessment & Plan Note (Signed)
Recent ECHo nml.

## 2019-09-07 NOTE — Progress Notes (Addendum)
VIRTUAL VISIT Due to national recommendations of social distancing due to [place] 19, a virtual visit is felt to be most appropriate for this patient at this time.   Interactive audio and video telecommunications were attempted between this provider and patient, however failed, due to patient having technical difficulties OR patient did not have access to video capability.  We continued and completed visit with audio only.   I connected with the patient on 09/07/19 at  8:40 AM EDT by TELEPHONE and verified that I am speaking with the correct person using two identifiers.   I discussed the limitations, risks, security and privacy concerns of performing an evaluation and management service by  virtual telehealth platform and the availability of in person appointments. I also discussed with the patient that there may be a patient responsible charge related to this service. The patient expressed understanding and agreed to proceed.  Patient location: Home Provider Location: [facility] Participants: Angela Ali and Craig Guess   Chief Complaint  Patient presents with  . URI    Postnasal drip in the back of her throat, sinus pressure and congestion. Comes and goes.    History of Present Illness:  86 year old female with history of  CHF, afib, allergies presents with worsening post nasal drip.  It has been more severe  with increase in pollen.  Continues feeling bloated and this makes her feel short of breath. SOB at rest and sitting... not when up an walking. Feels like it all started after milkshake... drinks almond milk usually.. still eats cheese.   She feels anxious when she cannot move it out of  her throat.  Hydroxyzine helped a little with anxiety... did not make her sleepy. She is worried about feeling and cannot stop thinking about things. Worried about moving/needing to move. Edison Simon helps some.   Saw GI.. neg work up CXR in 06/2019 clear, mild cardiomegaly. 08/01/2019: Abd  CT: No evidence of bowel obstruction. Normal appendix. Left colonic diverticulosis, without evidence of diverticulitis. Hepatic lesions are similar, favoring a combination of hemangiomas and cysts, benign. Numerous splenic lesions, unchanged, favoring benign hemangiomas or Lymphangiomas.  Given BNP 412.. referred to Cardiology for SOB  EKG unchanged  ECHO: 50-55% EF  Stress myoview: no ischemia.  08/23/2019: gastric emptying:  Normal.  Told to change to nexium.    On allegra, Singulair, flonase and advair  COVID 19 screen No recent travel or known exposure to COVID19 The patient denies respiratory symptoms of COVID 19 at this time.  The importance of social distancing was discussed today.   Review of Systems  Constitutional: Negative for chills and fever.  HENT: Positive for congestion. Negative for ear discharge, ear pain, nosebleeds, sinus pain and sore throat.   Eyes: Negative for pain and redness.  Respiratory: Negative for cough, shortness of breath and stridor.   Cardiovascular: Negative for chest pain, palpitations and leg swelling.  Gastrointestinal: Negative for abdominal pain, blood in stool, constipation, diarrhea, nausea and vomiting.  Genitourinary: Negative for dysuria.  Musculoskeletal: Negative for falls and myalgias.  Skin: Negative for rash.  Neurological: Negative for dizziness.  Psychiatric/Behavioral: Negative for depression. The patient is not nervous/anxious.       Past Medical History:  Diagnosis Date  . Allergic rhinitis, cause unspecified   . Atrial fibrillation ([place])   . Atrial flutter ([place])   . Bacterial pneumonia, unspecified   . Colonic polyp   . Congestive heart failure, unspecified   . Diabetes mellitus without  complication (HCC)    borderline-checks sugars but no meds  . Diaphragmatic hernia without mention of obstruction or gangrene   . Displacement of lumbar intervertebral disc without myelopathy   . Diverticulosis of colon (without  mention of hemorrhage)   . Edema   . Esophageal reflux   . Long term (current) use of anticoagulants   . Osteoarthrosis, unspecified whether generalized or localized, unspecified site   . Other abnormal glucose   . Other and unspecified hyperlipidemia   . Other malaise and fatigue   . Overweight(278.02)   . Prediabetes   . Secondary cardiomyopathy, unspecified   . Splenic mass 08/21/2011  . Stricture and stenosis of esophagus   . Thigh abscess   . Unspecified essential hypertension     reports that she has never smoked. She has never used smokeless tobacco. She reports that she does not drink alcohol or use drugs.   Current Outpatient Medications:  .  ACCU-CHEK FASTCLIX LANCETS MISC, Use to check blood sugar 2 times a day.  Dx: E11.8, Disp: 204 each, Rfl: 3 .  ACCU-CHEK GUIDE test strip, USE TO CHECK BLOOD SUGAR  TWO TIMES DAILY, Disp: 100 each, Rfl: 5 .  acetaminophen (TYLENOL ARTHRITIS PAIN) 650 MG CR tablet, Take 650 mg by mouth daily., Disp: , Rfl:  .  Ascorbic Acid (VITAMIN C PO), Take 1 tablet by mouth daily., Disp: , Rfl:  .  atorvastatin (LIPITOR) 40 MG tablet, TAKE 2 TABLETS BY MOUTH  DAILY AT 6 PM., Disp: 180 tablet, Rfl: 3 .  Blood Glucose Monitoring Suppl (ACCU-CHEK GUIDE) w/Device KIT, 1 each by Does not apply route 2 (two) times daily. Dx: E11.8, Disp: 1 kit, Rfl: 0 .  Cholecalciferol (VITAMIN D-3 PO), Take 1 tablet by mouth daily., Disp: , Rfl:  .  cloNIDine (CATAPRES) 0.1 MG tablet, TAKE ONE-HALF TABLET BY  MOUTH TWICE DAILY, Disp: 90 tablet, Rfl: 3 .  cyclobenzaprine (FLEXERIL) 5 MG tablet, Take 1 tablet (5 mg total) by mouth at bedtime., Disp: 30 tablet, Rfl: 0 .  digoxin (LANOXIN) 0.125 MG tablet, TAKE 1 TABLET BY MOUTH  DAILY, Disp: 90 tablet, Rfl: 3 .  esomeprazole (NEXIUM) 40 MG capsule, Take 1 capsule (40 mg total) by mouth daily before breakfast., Disp: 30 capsule, Rfl: 1 .  fexofenadine (ALLEGRA) 180 MG tablet, Take 1 tablet (180 mg total) by mouth daily., Disp:  90 tablet, Rfl: 3 .  fluticasone (FLONASE) 50 MCG/ACT nasal spray, daily. , Disp: , Rfl:  .  Fluticasone-Salmeterol (ADVAIR) 250-50 MCG/DOSE AEPB, Inhale 1 puff into the lungs 2 (two) times daily., Disp: 3 each, Rfl: 3 .  furosemide (LASIX) 40 MG tablet, Take 1 tablet (40 mg total) by mouth 2 (two) times daily., Disp: 180 tablet, Rfl: 3 .  hydrALAZINE (APRESOLINE) 50 MG tablet, TAKE 1 TABLET BY MOUTH 3  TIMES DAILY, Disp: 270 tablet, Rfl: 3 .  hydrocortisone-pramoxine (ANALPRAM-HC) 2.5-1 % rectal cream, Place 1 application rectally 3 (three) times daily., Disp: 30 g, Rfl: 0 .  hydrOXYzine (ATARAX/VISTARIL) 10 MG tablet, TAKE 0.5-1 TABLETS (5-10 MG TOTAL) BY MOUTH 2 (TWO) TIMES DAILY AS NEEDED., Disp: 180 tablet, Rfl: 0 .  latanoprost (XALATAN) 0.005 % ophthalmic solution, , Disp: , Rfl:  .  losartan (COZAAR) 100 MG tablet, TAKE 1 TABLET BY MOUTH  DAILY, Disp: 90 tablet, Rfl: 3 .  metoprolol succinate (TOPROL-XL) 25 MG 24 hr tablet, Take 1.5 tablets (37.5 mg) by mouth once daily, Disp: 135 tablet, Rfl: 3 .  montelukast (  SINGULAIR) 10 MG tablet, TAKE 1 TABLET BY MOUTH EVERYDAY AT BEDTIME, Disp: 30 tablet, Rfl: 11 .  potassium chloride SA (KLOR-CON) 20 MEQ tablet, Take 1 tablet (20 mEq total) by mouth 2 (two) times daily., Disp: 180 tablet, Rfl: 3 .  warfarin (COUMADIN) 5 MG tablet, TAKE 1 TABLET('5MG'$ ) BY MOUTH TUESDAY AND SATURDAY,AND  1/2 TABLET( 2.'5MG'$ ) ALL  OTHER DAYS AS DIRECTED BY  ANTICOAGULATION CLINIC, Disp: 58 tablet, Rfl: 3 .  benzonatate (TESSALON) 100 MG capsule, TAKE 2 CAPSULES BY MOUTH TWICE DAILY AS NEEDED FOR COUGH (Patient not taking: Reported on 09/07/2019), Disp: 120 capsule, Rfl: 1 .  levocetirizine (XYZAL) 5 MG tablet, Take 1 tablet (5 mg total) by mouth every evening. (Patient not taking: Reported on 09/07/2019), Disp: 30 tablet, Rfl: 11   Observations/Objective: Blood pressure (!) 161/90, temperature (!) 96.7 F (35.9 C).  Physical Exam  Physical Exam Constitutional:       General: The patient is not in acute distress. Pulmonary:     Effort: Pulmonary effort is normal. No respiratory distress.  Neurological:     Mental Status: The patient is alert and oriented to person, place, and time.  Psychiatric:        Mood and Affect: Mood normal.        Behavior: Behavior normal.   Assessment and Plan Anxiety Very likely cause of SOB at rest ( has none with exertion per pt) Neg cardiac eval.  Abdominal bloating  Only very small hiatal hernia.Marland Kitchen doubt full causing all symptoms as she thinks. Neg GI work up and nml emptying.   May be lactose intolerance.Marland Kitchen avoid cheese and milk.   (HFpEF) heart failure with preserved ejection fraction (HCC) Recent ECHo nml.     I discussed the assessment and treatment plan with the patient. The patient was provided an opportunity to ask questions and all were answered. The patient agreed with the plan and demonstrated an understanding of the instructions.   The patient was advised to call back or seek an in-person evaluation if the symptoms worsen or if the condition fails to improve as anticipated.     Angela Lofts, MD

## 2019-09-07 NOTE — Patient Instructions (Addendum)
Increase hydroxyzine  1 tablet to 2-3 times daily as needed for anxiety. Stop if feeling too sleepy. Stop flonase and start trial of Astelin.

## 2019-09-07 NOTE — Assessment & Plan Note (Signed)
Very likely cause of SOB at rest ( has none with exertion per pt) Neg cardiac eval.

## 2019-09-08 ENCOUNTER — Other Ambulatory Visit: Payer: Self-pay | Admitting: Family Medicine

## 2019-09-08 NOTE — Telephone Encounter (Signed)
Last office visit 09/08/2019 for Postnasal drip & anxiety.  Last refilled 07/18/2019 for #180 with no refills.  No future appointments with PCP.

## 2019-09-10 NOTE — Progress Notes (Signed)
Cardiology Office Note  Date:  09/11/2019   ID:  Angela Ali, DOB 1928-11-22, MRN [id number]  PCP:  Jinny Sanders, MD   Chief Complaint  Patient presents with  . office visit    Pt has some swelling in legs, ankles and feet. Meds verbally reviewed w/ pt.     HPI:  [name] is a very pleasant 86 year old woman with a history of  chronic atrial fibrillation on warfarin,  hypertension,  chronic lower extremity edema,  shortness of breath,  Echocardiogram 2011 ejection fraction 45 to 50% with mild aortic valve stenosis who presents for routine followup of her atrial fibrillation  And fatigue  On her last clinic visit had significant leg swelling, weight was high 162 pounds Echocardiogram confirming elevated right heart pressures, Lasix increased up to 40 twice daily Weight now 156 Leg swelling improved, still mild edema around her ankles  Still with  upper epigastric area fullness  has GI problem, Seen by GI , CT ABD/pelvis ordered 08/01/19  BP elevated today Is taking her clonidine and hydralazine inconsistently On her last clinic visit reported missing doses of hydralazine, was taking clonidine once a day, like to take all of her medications in the afternoon   chronic fatigue, sleeping in the daytime  no regular exercise program Limited by chronic back pain and hip pain and sciatica  Lab work reviewed in detail with her AIC 6.1 Total chol 184, LDL 112  EKG personally reviewed by myself on todays visit Atrial fibrillation with ventricular rate 72 bpm nonspecific ST abnormality Previously seen left bundle branch block has resolved, there is PVCs  Other past medical history reviewed Previous  fall with hematoma of the right lower extremity,  required care from the wound clinic for a large ulcerative wound on the right lateral aspect of her leg. This has healed well. History of medication confusion, some noncompliance. Prior trauma to her right lower extremity, surgery  to her left ankle  Echo 08/2009 EF 45 to 50%   Calcium channel blocker/diltiazem was held in the past secondary to edema.    PMH:   has a past medical history of Allergic rhinitis, cause unspecified, Atrial fibrillation ([place]), Atrial flutter ([place]), Bacterial pneumonia, unspecified, Colonic polyp, Congestive heart failure, unspecified, Diabetes mellitus without complication ([place]), Diaphragmatic hernia without mention of obstruction or gangrene, Displacement of lumbar intervertebral disc without myelopathy, Diverticulosis of colon (without mention of hemorrhage), Edema, Esophageal reflux, Long term (current) use of anticoagulants, Osteoarthrosis, unspecified whether generalized or localized, unspecified site, Other abnormal glucose, Other and unspecified hyperlipidemia, Other malaise and fatigue, Overweight(278.02), Prediabetes, Secondary cardiomyopathy, unspecified, Splenic mass (08/21/2011), Stricture and stenosis of esophagus, Thigh abscess, and Unspecified essential hypertension.  PSH:    Past Surgical History:  Procedure Laterality Date  . ABDOMINAL HYSTERECTOMY  1971   partial  . ANKLE SURGERY Left 2001  . CHOLECYSTECTOMY  2004  . COLONOSCOPY    . ESOPHAGEAL DILATION  01/2007  . POLYPECTOMY    . spleen removed     2005 or 2006 in [place]    Current Outpatient Medications  Medication Sig Dispense Refill  . ACCU-CHEK FASTCLIX LANCETS MISC Use to check blood sugar 2 times a day.  Dx: E11.8 204 each 3  . ACCU-CHEK GUIDE test strip USE TO CHECK BLOOD SUGAR  TWO TIMES DAILY 100 each 5  . acetaminophen (TYLENOL ARTHRITIS PAIN) 650 MG CR tablet Take 650 mg by mouth daily.    . Ascorbic Acid (VITAMIN C PO)  Take 1 tablet by mouth daily.    Marland Kitchen atorvastatin (LIPITOR) 40 MG tablet TAKE 2 TABLETS BY MOUTH  DAILY AT 6 PM. 180 tablet 3  . azelastine (ASTELIN) 0.1 % nasal spray Place 2 sprays into both nostrils 2 (two) times daily. Use in each nostril as directed 30 mL 12  . benzonatate (TESSALON)  100 MG capsule TAKE 2 CAPSULES BY MOUTH TWICE DAILY AS NEEDED FOR COUGH 120 capsule 1  . Blood Glucose Monitoring Suppl (ACCU-CHEK GUIDE) w/Device KIT 1 each by Does not apply route 2 (two) times daily. Dx: E11.8 1 kit 0  . Cholecalciferol (VITAMIN D-3 PO) Take 1 tablet by mouth daily.    . cloNIDine (CATAPRES) 0.1 MG tablet TAKE ONE-HALF TABLET BY  MOUTH TWICE DAILY 90 tablet 3  . cyclobenzaprine (FLEXERIL) 5 MG tablet Take 1 tablet (5 mg total) by mouth at bedtime. 30 tablet 0  . digoxin (LANOXIN) 0.125 MG tablet TAKE 1 TABLET BY MOUTH  DAILY 90 tablet 3  . esomeprazole (NEXIUM) 40 MG capsule Take 1 capsule (40 mg total) by mouth daily before breakfast. 30 capsule 1  . fexofenadine (ALLEGRA) 180 MG tablet Take 1 tablet (180 mg total) by mouth daily. 90 tablet 3  . fluticasone (FLONASE) 50 MCG/ACT nasal spray daily.     . Fluticasone-Salmeterol (ADVAIR) 250-50 MCG/DOSE AEPB Inhale 1 puff into the lungs 2 (two) times daily. 3 each 3  . furosemide (LASIX) 40 MG tablet Take 1 tablet (40 mg total) by mouth 2 (two) times daily. 180 tablet 3  . hydrALAZINE (APRESOLINE) 50 MG tablet TAKE 1 TABLET BY MOUTH 3  TIMES DAILY 270 tablet 3  . hydrocortisone-pramoxine (ANALPRAM-HC) 2.5-1 % rectal cream Place 1 application rectally 3 (three) times daily. 30 g 0  . hydrOXYzine (ATARAX/VISTARIL) 10 MG tablet TAKE 0.5-1 TABLET (5-10 MG TOTAL) BY MOUTH 2 (TWO) TIMES DAILY AS NEEDED. 180 tablet 1  . latanoprost (XALATAN) 0.005 % ophthalmic solution     . levocetirizine (XYZAL) 5 MG tablet Take 1 tablet (5 mg total) by mouth every evening. 30 tablet 11  . losartan (COZAAR) 100 MG tablet TAKE 1 TABLET BY MOUTH  DAILY 90 tablet 3  . metoprolol succinate (TOPROL-XL) 25 MG 24 hr tablet Take 1.5 tablets (37.5 mg) by mouth once daily 135 tablet 3  . montelukast (SINGULAIR) 10 MG tablet TAKE 1 TABLET BY MOUTH EVERYDAY AT BEDTIME 30 tablet 11  . potassium chloride SA (KLOR-CON) 20 MEQ tablet Take 1 tablet (20 mEq total) by  mouth 2 (two) times daily. 180 tablet 3  . warfarin (COUMADIN) 5 MG tablet TAKE 1 TABLET('5MG'$ ) BY MOUTH TUESDAY AND SATURDAY,AND  1/2 TABLET( 2.'5MG'$ ) ALL  OTHER DAYS AS DIRECTED BY  ANTICOAGULATION CLINIC 58 tablet 3   No current facility-administered medications for this visit.     Allergies:   Morphine and related, Percocet [oxycodone-acetaminophen], and Tramadol   Social History:  The patient  reports that she has never smoked. She has never used smokeless tobacco. She reports that she does not drink alcohol or use drugs.   Family History:   family history includes Bone cancer in her son; Coronary artery disease (age of onset: 66) in her mother; Diabetes in her daughter and daughter; Emphysema (age of onset: 58) in her father; Hyperlipidemia in her sister and sister; Hypertension in her daughter and daughter; Hypertension (age of onset: 67) in her mother; Ovarian cancer in her daughter.    Review of Systems: Review of Systems  Constitutional: Negative.   HENT: Negative.   Respiratory: Positive for shortness of breath.   Cardiovascular: Positive for leg swelling.  Gastrointestinal: Negative.        Abdominal bloating  Musculoskeletal: Positive for back pain and joint pain.  Neurological: Negative.   Psychiatric/Behavioral: Negative.   All other systems reviewed and are negative.    PHYSICAL EXAM: VS:  BP (!) 168/88 (BP Location: Left Arm, Patient Position: Sitting, Cuff Size: Normal)   Pulse 72   Ht '5\' 4"'$  (1.626 m)   Wt 156 lb (70.8 kg)   SpO2 98%   BMI 26.78 kg/m  , BMI Body mass index is 26.78 kg/m. Constitutional:  oriented to person, place, and time. No distress.  HENT:  Head: Grossly normal Eyes:  no discharge. No scleral icterus.  Neck: No JVD, no carotid bruits  Cardiovascular: Regular rate and rhythm, no murmurs appreciated Pulmonary/Chest: Clear to auscultation bilaterally, no wheezes or rails Abdominal: Soft.  no distension.  no tenderness.  Musculoskeletal:  Normal range of motion Neurological:  normal muscle tone. Coordination normal. No atrophy Skin: Skin warm and dry Psychiatric: normal affect, pleasant   Recent Labs: 05/02/2019: ALT 16 07/20/2019: BUN 14; Creatinine, Ser 0.92; Hemoglobin 12.7; Platelets 174.0; Potassium 3.9; Pro B Natriuretic peptide (BNP) 412.0; Sodium 141    Lipid Panel Lab Results  Component Value Date   CHOL 125 05/02/2019   HDL 42.50 05/02/2019   LDLCALC 68 05/02/2019   TRIG 76.0 05/02/2019      Wt Readings from Last 3 Encounters:  09/11/19 156 lb (70.8 kg)  07/24/19 161 lb (73 kg)  07/20/19 162 lb (73.5 kg)     ASSESSMENT AND PLAN:  Atrial fibrillation, unspecified type ([place]) - Plan: EKG 12-Lead Chronic atrial fibrillation On metoprolol 37.5 mg daily,  higher dose she had bradycardia No recent falls, tolerating anticoagulation  Chronic diastolic CHF (congestive heart failure) ([place]) - Plan: EKG 12-Lead Unable to tolerate compression hose for leg edema Weight down 6 pounds from prior clinic visit Stay on Lasix 40 twice daily, repeat BMP today  left bundle branch block Seen on prior EKG, resolved on today's EKG  Chronic fatigue Recommend she is try to increase her walking program  Hypertension Blood pressure elevated today, some inconsistency with taking her clonidine and hydralazine Recommend she take both of these medications twice a day  Hyperlipidemia Stressed importance of staying on her statin  Morbid obesity Weight is down, eating less   Total encounter time more than 25 minutes  Greater than 50% was spent in counseling and coordination of care with the patient  Disposition:   F/U  3 months   Orders Placed This Encounter  Procedures  . EKG 12-Lead     Signed, Esmond Plants, M.D., Ph.D. 09/11/2019  [place], [place]

## 2019-09-11 ENCOUNTER — Ambulatory Visit (INDEPENDENT_AMBULATORY_CARE_PROVIDER_SITE_OTHER): Payer: Medicare Other | Admitting: Cardiovascular Disease

## 2019-09-11 ENCOUNTER — Other Ambulatory Visit: Payer: Self-pay

## 2019-09-11 VITALS — BP 168/88 | HR 72 | Ht 64.0 in | Wt 156.0 lb

## 2019-09-11 DIAGNOSIS — I359 Nonrheumatic aortic valve disorder, unspecified: Secondary | ICD-10-CM

## 2019-09-11 DIAGNOSIS — E782 Mixed hyperlipidemia: Secondary | ICD-10-CM

## 2019-09-11 DIAGNOSIS — I10 Essential (primary) hypertension: Secondary | ICD-10-CM

## 2019-09-11 DIAGNOSIS — E118 Type 2 diabetes mellitus with unspecified complications: Secondary | ICD-10-CM | POA: Diagnosis not present

## 2019-09-11 DIAGNOSIS — I5032 Chronic diastolic (congestive) heart failure: Secondary | ICD-10-CM | POA: Diagnosis not present

## 2019-09-11 DIAGNOSIS — I4821 Permanent atrial fibrillation: Secondary | ICD-10-CM | POA: Diagnosis not present

## 2019-09-11 DIAGNOSIS — R0602 Shortness of breath: Secondary | ICD-10-CM

## 2019-09-11 NOTE — Patient Instructions (Addendum)
BMP today  Medication Instructions:  Please take clonidine twice a day, hydralazine twice a day  If you need a refill on your cardiac medications before your next appointment, please call your pharmacy.    Lab work: BMP   If you have labs (blood work) drawn today and your tests are completely normal, you will receive your results only by: Marland Kitchen MyChart Message (if you have MyChart) OR . A paper copy in the mail If you have any lab test that is abnormal or we need to change your treatment, we will call you to review the results.   Testing/Procedures: No new testing needed   Follow-Up: At [facility], you and your health needs are our priority.  As part of our continuing mission to provide you with exceptional heart care, we have created designated Provider Care Teams.  These Care Teams include your primary Cardiologist (physician) and Advanced Practice Providers (APPs -  Physician Assistants and Nurse Practitioners) who all work together to provide you with the care you need, when you need it.  . You will need a follow up appointment in 3 months   . Providers on your designated Care Team:   . Murray Hodgkins, NP . Christell Faith, PA-C . Marrianne Mood, PA-C  Any Other Special Instructions Will Be Listed Below (If Applicable).  COVID-19 Vaccine Information can be found at: [email or web address].uk For questions related to vaccine distribution or appointments, please email [email or web address] or call [phone].

## 2019-09-12 ENCOUNTER — Telehealth: Payer: Self-pay | Admitting: Cardiovascular Disease

## 2019-09-12 LAB — BASIC METABOLIC PANEL
BUN/Creatinine Ratio: 16 (ref 12–28)
BUN: 13 mg/dL (ref 8–27)
CO2: 28 mmol/L (ref 20–29)
Calcium: 10 mg/dL (ref 8.7–10.3)
Chloride: 103 mmol/L (ref 96–106)
Creatinine, Ser: 0.82 mg/dL (ref 0.57–1.00)
GFR calc Af Amer: 74 mL/min/{1.73_m2} (ref >59)
GFR calc non Af Amer: 64 mL/min/{1.73_m2} (ref >59)
Glucose: 99 mg/dL (ref 65–99)
Potassium: 4.6 mmol/L (ref 3.5–5.2)
Sodium: 142 mmol/L (ref 134–144)

## 2019-09-12 NOTE — Telephone Encounter (Signed)
Pt c/o medication issue:  1. Name of Medication: furosemide   2. How are you currently taking this medication (dosage and times per day)? 40 mg po BID   3. Are you having a reaction (difficulty breathing--STAT)? no  4. What is your medication issue?  Patient not sure what to do about dose and recent bmp results.   Please call to confirm hold or take

## 2019-09-12 NOTE — Telephone Encounter (Signed)
Spoke with patient and reviewed that labs preliminary looked great. Instructed her to continue medication as ordered and that I would call her back with those results and if he wants to make any changes. She verbalized understanding of instructions with no further questions at this time.

## 2019-09-13 ENCOUNTER — Telehealth: Payer: Self-pay | Admitting: Cardiovascular Disease

## 2019-09-13 ENCOUNTER — Telehealth: Payer: Self-pay

## 2019-09-13 DIAGNOSIS — I491 Atrial premature depolarization: Secondary | ICD-10-CM | POA: Diagnosis not present

## 2019-09-13 DIAGNOSIS — R000 Tachycardia, unspecified: Secondary | ICD-10-CM | POA: Diagnosis not present

## 2019-09-13 DIAGNOSIS — I10 Essential (primary) hypertension: Secondary | ICD-10-CM | POA: Diagnosis not present

## 2019-09-13 MED ORDER — DIAZEPAM 2 MG PO TABS: 2.0000 mg | ORAL_TABLET | Freq: Three times a day (TID) | ORAL | 0 refills | Status: AC | PRN

## 2019-09-13 NOTE — Telephone Encounter (Signed)
Patient's daughter contacted the office and stated that the patient's son was killed yesterday, and she states that the patient is so upset and will not calm down. She states that she would like something called in ASAP to help with her nerves, and calm her down.

## 2019-09-13 NOTE — Telephone Encounter (Signed)
New message   Patient's daughter states that the patient son was killed and wants to know if she should tell the patient or call er before the patient is told. Please call to discuss.

## 2019-09-13 NOTE — Telephone Encounter (Signed)
Spoke with patients daughter and she wanted to know if they should call 911 to be there when they tell her about the son who was killed. Advised that it would be more appropriate if she develops any chest pain or trouble breathing to then call 911. She verbalized understanding of our conversation with no further questions at this time.

## 2019-09-13 NOTE — Telephone Encounter (Signed)
[facility] Night - Client Nonclinical Telephone Record AccessNurse Client [facility] Night - Client Client Site [place] - Night Physician Eliezer Lofts - MD Contact Type Call Who Is Calling Patient / Member / Family / Caregiver Caller Name [place] Phone Number [phone] Patient Name Angela Ali Patient DOB [birth year] Call Type Message Only Information Provided Reason for Call Request for General Office Information Initial Comment Caller states her mother is a patient of Dr. Diona Browner and they lost their brother and they need to inform their mother but are concerned due to her heart problems. They would like the doctor to call them back asap to advise as to the best way to tell their mom. Additional Comment Caller rushed off the phone to get calls from family. Caller would like a call back. Disp. Time Disposition Final User 09/13/2019 7:29:38 AM General Information Provided Yes Luan Pulling, Dawn Call Closed By: Epifania Gore Transaction Date/Time: 09/13/2019 7:24:47 AM (ET)

## 2019-09-13 NOTE — Telephone Encounter (Signed)
See other phone encounter from 09/13/2019.

## 2019-09-13 NOTE — Telephone Encounter (Signed)
Daughter notified by telephone that Dr. Lorelei Pont has sent in a prescription for Valium to [facility] on [address].

## 2019-09-13 NOTE — Telephone Encounter (Signed)
ok 

## 2019-09-14 ENCOUNTER — Other Ambulatory Visit: Payer: Self-pay | Admitting: Physician Assistant

## 2019-09-14 NOTE — Telephone Encounter (Signed)
Agreed -

## 2019-09-26 ENCOUNTER — Telehealth: Payer: Self-pay | Admitting: Physician Assistant

## 2019-09-26 NOTE — Telephone Encounter (Signed)
The pt had an appt for 6/2 but is unable to keep this appt due to other family obligations.  She wanted to reschedule to a later date. appt moved to next available.  Pt wil lcall back with any further concerns

## 2019-09-26 NOTE — Telephone Encounter (Signed)
Patient requesting to speak with nurse about her symptoms (Abd pressure)

## 2019-09-27 ENCOUNTER — Ambulatory Visit: Payer: Medicare Other | Admitting: Gastroenterology

## 2019-09-27 ENCOUNTER — Ambulatory Visit (INDEPENDENT_AMBULATORY_CARE_PROVIDER_SITE_OTHER): Payer: Medicare Other

## 2019-09-27 DIAGNOSIS — Z7901 Long term (current) use of anticoagulants: Secondary | ICD-10-CM

## 2019-09-27 LAB — POCT INR: INR: 1.9 — AB (ref 2.0–3.0)

## 2019-09-27 NOTE — Patient Instructions (Addendum)
Pre visit review using our clinic review tool, if applicable. No additional management support is needed unless otherwise documented below in the visit note.  Increase dose today to 5mg  then continue taking 2.5mg  daily.  Recheck in 4 weeks.

## 2019-09-28 ENCOUNTER — Ambulatory Visit: Payer: Medicare Other

## 2019-10-01 ENCOUNTER — Other Ambulatory Visit: Payer: Self-pay | Admitting: Family Medicine

## 2019-10-03 ENCOUNTER — Other Ambulatory Visit: Payer: Self-pay | Admitting: Physician Assistant

## 2019-10-19 ENCOUNTER — Other Ambulatory Visit: Payer: Self-pay

## 2019-10-19 ENCOUNTER — Ambulatory Visit (INDEPENDENT_AMBULATORY_CARE_PROVIDER_SITE_OTHER): Payer: Medicare Other

## 2019-10-19 DIAGNOSIS — Z7901 Long term (current) use of anticoagulants: Secondary | ICD-10-CM

## 2019-10-19 LAB — POCT INR: INR: 1.8 — AB (ref 2.0–3.0)

## 2019-10-19 MED ORDER — WARFARIN SODIUM 1 MG PO TABS: ORAL_TABLET | ORAL | 1 refills | Status: DC

## 2019-10-19 NOTE — Patient Instructions (Addendum)
Pre visit review using our clinic review tool, if applicable. No additional management support is needed unless otherwise documented below in the visit note.  Increase dose today to 5mg  then continue to take 2.5mg  daily except take 4mg  on Wednesdays  Recheck in 2 weeks.

## 2019-10-24 ENCOUNTER — Ambulatory Visit: Payer: Medicare Other | Admitting: Cardiovascular Disease

## 2019-10-24 ENCOUNTER — Ambulatory Visit: Payer: Medicare Other

## 2019-10-26 ENCOUNTER — Ambulatory Visit: Payer: Medicare Other

## 2019-11-02 ENCOUNTER — Ambulatory Visit (INDEPENDENT_AMBULATORY_CARE_PROVIDER_SITE_OTHER): Payer: Medicare Other

## 2019-11-02 DIAGNOSIS — Z7901 Long term (current) use of anticoagulants: Secondary | ICD-10-CM | POA: Diagnosis not present

## 2019-11-02 LAB — POCT INR: INR: 1.7 — AB (ref 2.0–3.0)

## 2019-11-02 NOTE — Patient Instructions (Addendum)
Pre visit review using our clinic review tool, if applicable. No additional management support is needed unless otherwise documented below in the visit note.  Increase dose today to 5mg  then change weekly dose to take 2.5mg  daily except take 4mg  on Tues and Thursday.  Recheck in 3 weeks.

## 2019-11-09 ENCOUNTER — Ambulatory Visit: Payer: Medicare Other | Admitting: Gastroenterology

## 2019-11-10 ENCOUNTER — Other Ambulatory Visit: Payer: Self-pay

## 2019-11-10 ENCOUNTER — Ambulatory Visit: Payer: Medicare Other | Admitting: Podiatry

## 2019-11-10 ENCOUNTER — Other Ambulatory Visit: Payer: Self-pay | Admitting: Family Medicine

## 2019-11-10 DIAGNOSIS — M79676 Pain in unspecified toe(s): Secondary | ICD-10-CM

## 2019-11-10 DIAGNOSIS — L84 Corns and callosities: Secondary | ICD-10-CM | POA: Diagnosis not present

## 2019-11-10 DIAGNOSIS — B351 Tinea unguium: Secondary | ICD-10-CM

## 2019-11-10 DIAGNOSIS — M205X2 Other deformities of toe(s) (acquired), left foot: Secondary | ICD-10-CM

## 2019-11-10 DIAGNOSIS — Z7901 Long term (current) use of anticoagulants: Secondary | ICD-10-CM

## 2019-11-10 DIAGNOSIS — M79609 Pain in unspecified limb: Secondary | ICD-10-CM

## 2019-11-10 DIAGNOSIS — E118 Type 2 diabetes mellitus with unspecified complications: Secondary | ICD-10-CM | POA: Diagnosis not present

## 2019-11-10 DIAGNOSIS — M205X1 Other deformities of toe(s) (acquired), right foot: Secondary | ICD-10-CM

## 2019-11-10 NOTE — Telephone Encounter (Signed)
Pt compliant with coumadin management. Sent in script 

## 2019-11-12 ENCOUNTER — Encounter: Payer: Self-pay | Admitting: Podiatry

## 2019-11-12 NOTE — Progress Notes (Signed)
Subjective: Angela Ali presents today preventative diabetic foot care and painful thick toenails that are difficult to trim. Pain interferes with ambulation. Aggravating factors include wearing enclosed shoe gear. Pain is relieved with periodic professional debridement.  Jinny Sanders, MD is patient's PCP. Last visit was: 09/07/2019.  She relates they will be moving to [facility] to be closer to her other daughter and her husband. She also lost her son recently.   Past Medical History:  Diagnosis Date  . Allergic rhinitis, cause unspecified   . Atrial fibrillation ([place])   . Atrial flutter ([place])   . Bacterial pneumonia, unspecified   . Colonic polyp   . Congestive heart failure, unspecified   . Diabetes mellitus without complication (HCC)    borderline-checks sugars but no meds  . Diaphragmatic hernia without mention of obstruction or gangrene   . Displacement of lumbar intervertebral disc without myelopathy   . Diverticulosis of colon (without mention of hemorrhage)   . Edema   . Esophageal reflux   . Long term (current) use of anticoagulants   . Osteoarthrosis, unspecified whether generalized or localized, unspecified site   . Other abnormal glucose   . Other and unspecified hyperlipidemia   . Other malaise and fatigue   . Overweight(278.02)   . Prediabetes   . Secondary cardiomyopathy, unspecified   . Splenic mass 08/21/2011  . Stricture and stenosis of esophagus   . Thigh abscess   . Unspecified essential hypertension      Patient Active Problem List   Diagnosis Date Noted  . SOB (shortness of breath) 09/07/2019  . Left flank pain 05/18/2019  . Abdominal bloating 06/10/2018  . Anxiety 04/29/2018  . Post-nasal drip 06/08/2017  . Long term (current) use of anticoagulants 04/08/2017  . First degree burn of right forearm 02/19/2017  . Bulging lumbar disc 11/20/2016  . Obesity with serious comorbidity 11/25/2015  . Sacroiliac joint pain 09/26/2015  . Lipoma of back  06/06/2015  . Osteoarthritis of both knees 01/18/2015  . Counseling regarding end of life decision making 09/07/2014  . Encounter for therapeutic drug monitoring 06/05/2013  . Chronic diastolic CHF (congestive heart failure) ([place]) 03/21/2013  . Chronic cough 02/03/2013  . Recurrent boils 06/22/2012  . Chronic constipation 12/01/2011  . Splenic mass 08/21/2011  . Hx of Hematoma of leg, with chronic leg weakness and numbness 09/15/2010  . Moderate persistent asthma in adult without complication 98/92/1194  . FATIGUE, CHRONIC 03/19/2009  . Vitamin D deficiency 02/26/2009  . LOW BACK PAIN, CHRONIC 11/19/2008  . Secondary cardiomyopathy ([place]) 10/02/2008  . Allergic rhinitis due to pollen 03/23/2008  . BENIGN NEOPLASM [place] DIGESTIVE SYSTEM 02/01/2007  . STRICTURE, ESOPHAGEAL 02/01/2007  . Diabetes mellitus type 2 with complications ([place]) 17/40/8144  . Osteoporosis 12/21/2006  . Hyperlipidemia associated with type 2 diabetes mellitus ([place]) 12/16/2006  . Hypertension associated with diabetes ([place]) 12/16/2006  . Atrial fibrillation ([place]) 12/16/2006  . (HFpEF) heart failure with preserved ejection fraction ([place]) 12/16/2006  . GERD 12/16/2006  . Diaphragmatic hernia 12/16/2006  . OSTEOARTHRITIS 12/16/2006  . KNEE PAIN, RIGHT, CHRONIC 12/16/2006  . HERNIATED LUMBAR DISC 12/16/2006  . PERIPHERAL EDEMA 12/16/2006  . COLONIC POLYPS, HYPERPLASTIC 08/26/2000  . DIVERTICULOSIS, COLON 08/26/2000    Current Outpatient Medications on File Prior to Visit  Medication Sig Dispense Refill  . ACCU-CHEK FASTCLIX LANCETS MISC Use to check blood sugar 2 times a day.  Dx: E11.8 204 each 3  . ACCU-CHEK GUIDE test strip USE TO CHECK BLOOD  SUGAR  TWO TIMES DAILY 200 strip 3  . acetaminophen (TYLENOL ARTHRITIS PAIN) 650 MG CR tablet Take 650 mg by mouth daily.    . Ascorbic Acid (VITAMIN C PO) Take 1 tablet by mouth daily.    Mar[place] atorvastatin (LIPITOR) 40 MG tablet TAKE 2 TABLETS BY MOUTH  DAILY AT 6  PM. 180 tablet 3  . azelastine (ASTELIN) 0.1 % nasal spray Place 2 sprays into both nostrils 2 (two) times daily. Use in each nostril as directed 30 mL 12  . benzonatate (TESSALON) 100 MG capsule TAKE 2 CAPSULES BY MOUTH TWICE DAILY AS NEEDED FOR COUGH 120 capsule 1  . Blood Glucose Monitoring Suppl (ACCU-CHEK GUIDE) w/Device KIT 1 each by Does not apply route 2 (two) times daily. Dx: E11.8 1 kit 0  . Cholecalciferol (VITAMIN D-3 PO) Take 1 tablet by mouth daily.    . cloNIDine (CATAPRES) 0.1 MG tablet TAKE ONE-HALF TABLET BY  MOUTH TWICE DAILY 90 tablet 3  . cyclobenzaprine (FLEXERIL) 5 MG tablet Take 1 tablet (5 mg total) by mouth at bedtime. 30 tablet 0  . diazepam (VALIUM) 2 MG tablet Take 1 tablet (2 mg total) by mouth every 8 (eight) hours as needed for anxiety. 10 tablet 0  . digoxin (LANOXIN) 0.125 MG tablet TAKE 1 TABLET BY MOUTH  DAILY 90 tablet 3  . esomeprazole (NEXIUM) 40 MG capsule TAKE 1 CAPSULE BY MOUTH EVERY DAY BEFORE BREAKFAST 90 capsule 1  . fexofenadine (ALLEGRA) 180 MG tablet Take 1 tablet (180 mg total) by mouth daily. 90 tablet 3  . fluticasone (FLONASE) 50 MCG/ACT nasal spray daily.     . Fluticasone-Salmeterol (ADVAIR) 250-50 MCG/DOSE AEPB Inhale 1 puff into the lungs 2 (two) times daily. 3 each 3  . hydrALAZINE (APRESOLINE) 50 MG tablet TAKE 1 TABLET BY MOUTH 3  TIMES DAILY 270 tablet 3  . hydrocortisone-pramoxine (ANALPRAM-HC) 2.5-1 % rectal cream Place 1 application rectally 3 (three) times daily. 30 g 0  . hydrOXYzine (ATARAX/VISTARIL) 10 MG tablet TAKE 0.5-1 TABLET (5-10 MG TOTAL) BY MOUTH 2 (TWO) TIMES DAILY AS NEEDED. 180 tablet 1  . latanoprost (XALATAN) 0.005 % ophthalmic solution     . levocetirizine (XYZAL) 5 MG tablet Take 1 tablet (5 mg total) by mouth every evening. 30 tablet 11  . losartan (COZAAR) 100 MG tablet TAKE 1 TABLET BY MOUTH  DAILY 90 tablet 3  . metoprolol succinate (TOPROL-XL) 25 MG 24 hr tablet Take 1.5 tablets (37.5 mg) by mouth once daily 135  tablet 3  . montelukast (SINGULAIR) 10 MG tablet TAKE 1 TABLET BY MOUTH EVERYDAY AT BEDTIME 30 tablet 11  . potassium chloride SA (KLOR-CON) 20 MEQ tablet Take 1 tablet (20 mEq total) by mouth 2 (two) times daily. 180 tablet 3  . warfarin (COUMADIN) 5 MG tablet TAKE 1 TABLET(5MG) BY MOUTH TUESDAY AND SATURDAY,AND  1/2 TABLET( 2.5MG) ALL  OTHER DAYS AS DIRECTED BY  ANTICOAGULATION CLINIC 58 tablet 3  . furosemide (LASIX) 40 MG tablet Take 1 tablet (40 mg total) by mouth 2 (two) times daily. 180 tablet 3   No current facility-administered medications on file prior to visit.     Allergies  Allergen Reactions  . Morphine And Related   . Percocet [Oxycodone-Acetaminophen] Nausea Only       . Tramadol Nausea Only    Objective: ONETA SIGMAN is a pleasant 86 y.o. African American female WD, WN in NAD. AAO x 3.  There were no vitals filed for  this visit.  Vascular Examination: Capillary refill time to digits immediate b/l. Palpable pedal pulses b/l LE. Pedal hair sparse. Lower extremity skin temperature gradient within normal limits. No pain with calf compression b/l. +1 pitting edema b/l lower extremities. No ischemia or gangrene noted b/l lower extremities.  Dermatological Examination: Pedal skin with normal turgor, texture and tone bilaterally. No open wounds bilaterally. No interdigital macerations bilaterally. Toenails 1-5 b/l elongated, discolored, dystrophic, thickened, crumbly with subungual debris and tenderness to dorsal palpation. Hyperkeratotic lesion(s) L 2nd toe.  No erythema, no edema, no drainage, no flocculence.  Musculoskeletal: Normal muscle strength 5/5 to all lower extremity muscle groups bilaterally. No pain crepitus or joint limitation noted with ROM b/l. Hammertoes noted to the b/l lower extremities.  Neurological Examination: Protective sensation intact 5/5 intact bilaterally with 10g monofilament b/l. Vibratory sensation intact b/l.  Last A1c: Hemoglobin A1C  Latest Ref Rng & Units 05/02/2019  HGBA1C 4.6 - 6.5 % 5.9  Some recent data might be hidden   Assessment: 1. Pain due to onychomycosis of nail   2. Corns   3. Acquired claw toe of both feet   4. Diabetes mellitus type 2 with complications ([place])    Plan: -Examined patient. -Continue diabetic foot care principles. -Toenails 1-5 b/l were debrided in length and girth with sterile nail nippers and dremel without iatrogenic bleeding.  -Corn(s) L 2nd toe pared utilizing sterile scalpel blade without complication or incident. Total number debrided=1. -Patient to report any pedal injuries to medical professional immediately. -Patient to continue soft, supportive shoe gear daily. -Patient/POA to call should there be question/concern in the interim.  Return in about 3 months (around 02/10/2020).  Marzetta Board, DPM

## 2019-11-16 ENCOUNTER — Ambulatory Visit: Payer: Medicare Other | Admitting: Family Medicine

## 2019-11-16 ENCOUNTER — Telehealth: Payer: Self-pay | Admitting: Cardiovascular Disease

## 2019-11-16 ENCOUNTER — Telehealth: Payer: Self-pay | Admitting: Family Medicine

## 2019-11-16 NOTE — Telephone Encounter (Signed)
Patient complaining about leg swelling. Patient stated her right leg is worse than her left. Patient stated it is better than what it was at her office visit with Dr. Rockey Situ. Patient stated she has Lasix 40 mg BID that she tries to take regularly, but she does not take it if she has to go anywhere. Informed patient that she should be taking medication daily and not skipping doses. Encouraged patient to elevated BLE, reduce salt in her diet, and try to weight herself daily. Will forward to Dr. Rockey Situ and his nurse for further advisement.

## 2019-11-16 NOTE — Telephone Encounter (Signed)
Pt c/o swelling: STAT is pt has developed SOB within 24 hours  1) How much weight have you gained and in what time span?  no  2) If swelling, where is the swelling located? BLE   3) Are you currently taking a fluid pill? Yes but delays taking when leaving home as she has urgency to void   4) Are you currently SOB?  No   5) Do you have a log of your daily weights (if so, list)? Not available   6) Have you gained 3 pounds in a day or 5 pounds in a week? No   7) Have you traveled recently? No

## 2019-11-16 NOTE — Telephone Encounter (Signed)
Patient called office today stating she cant come to her appointment today because she doesn't have a ride to the office. She stated that her leg is still pretty swollen and shes not sure if theres anything she can do or take right now until she is able to come in office. She would like Butch Penny to give her a call whenever she has a minute on her cell [phone].

## 2019-11-16 NOTE — Telephone Encounter (Signed)
Noted  

## 2019-11-16 NOTE — Telephone Encounter (Signed)
I spoke with pt;pt has swelling in both lower legs; pt said Dr Rockey Situ had increased lasix and Dr Rockey Situ told pt to take lasix 40 mg bid. Pt said has had swelling in legs for a long time but swelling has worsened and pt has not been able to elevate legs as often as usual. Pt has been riding in car more but today pt did not have transportation.pt said transportation is hard sometimes because her family is so busy. No redness, warmth or pain in either leg. No CP or SOB. Pt said she thinks she will call Dr Donivan Scull office and see what Dr Rockey Situ thinks since she last saw Dr Rockey Situ about this issue. Pt will cb to [facility] if needed. FYI to Dr Diona Browner.

## 2019-11-16 NOTE — Telephone Encounter (Signed)
Can we triage her? I have not seen her for this. I have not seen her recently.

## 2019-11-19 NOTE — Telephone Encounter (Signed)
Right leg swelling typically always worse at baseline Would suggest daily weights, Lasix 40 BID Compression hose especially on right, watch fluid and salt intake Would call back with weights so we can compare to prior weight on record

## 2019-11-20 ENCOUNTER — Ambulatory Visit: Payer: Medicare Other | Admitting: Cardiovascular Disease

## 2019-11-21 ENCOUNTER — Other Ambulatory Visit: Payer: Self-pay

## 2019-11-21 ENCOUNTER — Ambulatory Visit (INDEPENDENT_AMBULATORY_CARE_PROVIDER_SITE_OTHER): Payer: Medicare Other

## 2019-11-21 DIAGNOSIS — Z7901 Long term (current) use of anticoagulants: Secondary | ICD-10-CM

## 2019-11-21 LAB — POCT INR: INR: 1.4 — AB (ref 2.0–3.0)

## 2019-11-21 NOTE — Patient Instructions (Addendum)
Pre visit review using our clinic review tool, if applicable. No additional management support is needed unless otherwise documented below in the visit note.  Increase dose today to 7mg  and increase dose tomorrow to 5mg  then change weekly dose to take 4 mg daily except take 2.5mg  on Mon, Wed, and Friday.  Recheck in 2 weeks.

## 2019-11-23 ENCOUNTER — Ambulatory Visit: Payer: Medicare Other

## 2019-11-23 NOTE — Telephone Encounter (Signed)
Left voicemail message to call back to review provider recommendations. 

## 2019-11-27 ENCOUNTER — Telehealth: Payer: Self-pay | Admitting: Family Medicine

## 2019-11-27 NOTE — Telephone Encounter (Signed)
Pt called to request a letter for [place]. She states she will be moving to [place] at the end of the month and the mailbox is going to be too far for her to walk to. She is wanting a mailbox closer to [employer], but the [employer] states they need a letter from a doctor for them to be able to do this.

## 2019-11-28 NOTE — Telephone Encounter (Signed)
Spoke with Angela Ali.  I advised that letter has been written and I am just waiting on Dr. Diona Browner to review and approve.  She would like to pick up letter next Tuesday 12/05/2019 when she comes in for her PT/INR check.  I advised I would leave letter up front for her to pick up next week at her appointment.

## 2019-11-28 NOTE — Telephone Encounter (Signed)
Patient has significant disability. Please write letter for mailbox closer to house. I will edut and sign.

## 2019-11-28 NOTE — Telephone Encounter (Signed)
Letter written and routed to Dr. Randa Spike to review and edit if needed.

## 2019-11-28 NOTE — Telephone Encounter (Signed)
Pt request Butch Penny to call her back, I did advise pt that the letter is waiting for review from PCP before it's ready for pick up, pt still asked in the mean time can Butch Penny call her back to discuss something about the letter. Only wanted to talk to [facility]

## 2019-12-01 MED ORDER — FUROSEMIDE 40 MG PO TABS: 40.0000 mg | ORAL_TABLET | Freq: Two times a day (BID) | ORAL | 3 refills | Status: AC

## 2019-12-01 NOTE — Telephone Encounter (Signed)
Spoke with patient and reviewed provider recommendations and she verbalized understanding. She is moving to [place] with family soon. She did provide her new address and states that her swelling is much better now. She will start doing daily weights and also confirmed her upcoming appointment here in our office. Advised it may be good to see him one last time before she moves out of town. She was very appreciative for the call with no further questions for now.   Angela Ali [place] [phone]

## 2019-12-05 ENCOUNTER — Other Ambulatory Visit: Payer: Self-pay

## 2019-12-05 ENCOUNTER — Ambulatory Visit (INDEPENDENT_AMBULATORY_CARE_PROVIDER_SITE_OTHER): Payer: Medicare Other

## 2019-12-05 DIAGNOSIS — Z7901 Long term (current) use of anticoagulants: Secondary | ICD-10-CM | POA: Diagnosis not present

## 2019-12-05 LAB — POCT INR: INR: 4.2 — AB (ref 2.0–3.0)

## 2019-12-05 NOTE — Patient Instructions (Addendum)
Pre visit review using our clinic review tool, if applicable. No additional management support is needed unless otherwise documented below in the visit note.  Hold dose today and and and take 1mg  tomorrow then change weekly dose to take 3 mg daily except take 2.5mg  on Mon, Wed, and Friday.  Recheck in 3 weeks.

## 2019-12-09 ENCOUNTER — Other Ambulatory Visit: Payer: Self-pay | Admitting: Family Medicine

## 2019-12-09 DIAGNOSIS — Z7901 Long term (current) use of anticoagulants: Secondary | ICD-10-CM

## 2019-12-09 NOTE — Progress Notes (Deleted)
Cardiology Office Note  Date:  12/09/2019   ID:  MAYE PARKINSON, DOB 1928-11-12, MRN [id number]  PCP:  Jinny Sanders, MD   No chief complaint on file.   HPI:  Ms. Slabaugh is a very pleasant 86 year old woman with a history of  chronic atrial fibrillation on warfarin,  hypertension,  chronic lower extremity edema,  shortness of breath,  Echocardiogram 2011 ejection fraction 45 to 50% with mild aortic valve stenosis who presents for routine followup of her atrial fibrillation  And fatigue  On her last clinic visit had significant leg swelling, weight was high 162 pounds Echocardiogram confirming elevated right heart pressures, Lasix increased up to 40 twice daily Weight now 156 Leg swelling improved, still mild edema around her ankles  Still with  upper epigastric area fullness  has GI problem, Seen by GI , CT ABD/pelvis ordered 08/01/19  BP elevated today Is taking her clonidine and hydralazine inconsistently On her last clinic visit reported missing doses of hydralazine, was taking clonidine once a day, like to take all of her medications in the afternoon   chronic fatigue, sleeping in the daytime  no regular exercise program Limited by chronic back pain and hip pain and sciatica  Lab work reviewed in detail with her AIC 6.1 Total chol 184, LDL 112  EKG personally reviewed by myself on todays visit Atrial fibrillation with ventricular rate 72 bpm nonspecific ST abnormality Previously seen left bundle branch block has resolved, there is PVCs  Other past medical history reviewed Previous  fall with hematoma of the right lower extremity,  required care from the wound clinic for a large ulcerative wound on the right lateral aspect of her leg. This has healed well. History of medication confusion, some noncompliance. Prior trauma to her right lower extremity, surgery to her left ankle  Echo 08/2009 EF 45 to 50%   Calcium channel blocker/diltiazem was held in the past  secondary to edema.    PMH:   has a past medical history of Allergic rhinitis, cause unspecified, Atrial fibrillation ([place]), Atrial flutter ([place]), Bacterial pneumonia, unspecified, Colonic polyp, Congestive heart failure, unspecified, Diabetes mellitus without complication ([place]), Diaphragmatic hernia without mention of obstruction or gangrene, Displacement of lumbar intervertebral disc without myelopathy, Diverticulosis of colon (without mention of hemorrhage), Edema, Esophageal reflux, Long term (current) use of anticoagulants, Osteoarthrosis, unspecified whether generalized or localized, unspecified site, Other abnormal glucose, Other and unspecified hyperlipidemia, Other malaise and fatigue, Overweight(278.02), Prediabetes, Secondary cardiomyopathy, unspecified, Splenic mass (08/21/2011), Stricture and stenosis of esophagus, Thigh abscess, and Unspecified essential hypertension.  PSH:    Past Surgical History:  Procedure Laterality Date  . ABDOMINAL HYSTERECTOMY  1971   partial  . ANKLE SURGERY Left 2001  . CHOLECYSTECTOMY  2004  . COLONOSCOPY    . ESOPHAGEAL DILATION  01/2007  . POLYPECTOMY    . spleen removed     2005 or 2006 in [place]    Current Outpatient Medications  Medication Sig Dispense Refill  . ACCU-CHEK FASTCLIX LANCETS MISC Use to check blood sugar 2 times a day.  Dx: E11.8 204 each 3  . ACCU-CHEK GUIDE test strip USE TO CHECK BLOOD SUGAR  TWO TIMES DAILY 200 strip 3  . acetaminophen (TYLENOL ARTHRITIS PAIN) 650 MG CR tablet Take 650 mg by mouth daily.    . Ascorbic Acid (VITAMIN C PO) Take 1 tablet by mouth daily.    Marland Kitchen atorvastatin (LIPITOR) 40 MG tablet TAKE 2 TABLETS BY MOUTH  DAILY AT 6  PM. 180 tablet 3  . azelastine (ASTELIN) 0.1 % nasal spray Place 2 sprays into both nostrils 2 (two) times daily. Use in each nostril as directed 30 mL 12  . benzonatate (TESSALON) 100 MG capsule TAKE 2 CAPSULES BY MOUTH TWICE DAILY AS NEEDED FOR COUGH 120 capsule 1  . Blood Glucose  Monitoring Suppl (ACCU-CHEK GUIDE) w/Device KIT 1 each by Does not apply route 2 (two) times daily. Dx: E11.8 1 kit 0  . Cholecalciferol (VITAMIN D-3 PO) Take 1 tablet by mouth daily.    . cloNIDine (CATAPRES) 0.1 MG tablet TAKE ONE-HALF TABLET BY  MOUTH TWICE DAILY 90 tablet 3  . cyclobenzaprine (FLEXERIL) 5 MG tablet Take 1 tablet (5 mg total) by mouth at bedtime. 30 tablet 0  . diazepam (VALIUM) 2 MG tablet Take 1 tablet (2 mg total) by mouth every 8 (eight) hours as needed for anxiety. 10 tablet 0  . digoxin (LANOXIN) 0.125 MG tablet TAKE 1 TABLET BY MOUTH  DAILY 90 tablet 3  . esomeprazole (NEXIUM) 40 MG capsule TAKE 1 CAPSULE BY MOUTH EVERY DAY BEFORE BREAKFAST 90 capsule 1  . fexofenadine (ALLEGRA) 180 MG tablet Take 1 tablet (180 mg total) by mouth daily. 90 tablet 3  . fluticasone (FLONASE) 50 MCG/ACT nasal spray daily.     . Fluticasone-Salmeterol (ADVAIR) 250-50 MCG/DOSE AEPB Inhale 1 puff into the lungs 2 (two) times daily. 3 each 3  . furosemide (LASIX) 40 MG tablet Take 1 tablet (40 mg total) by mouth 2 (two) times daily. 180 tablet 3  . hydrALAZINE (APRESOLINE) 50 MG tablet TAKE 1 TABLET BY MOUTH 3  TIMES DAILY 270 tablet 3  . hydrocortisone-pramoxine (ANALPRAM-HC) 2.5-1 % rectal cream Place 1 application rectally 3 (three) times daily. 30 g 0  . hydrOXYzine (ATARAX/VISTARIL) 10 MG tablet TAKE 0.5-1 TABLET (5-10 MG TOTAL) BY MOUTH 2 (TWO) TIMES DAILY AS NEEDED. 180 tablet 1  . latanoprost (XALATAN) 0.005 % ophthalmic solution     . levocetirizine (XYZAL) 5 MG tablet Take 1 tablet (5 mg total) by mouth every evening. 30 tablet 11  . losartan (COZAAR) 100 MG tablet TAKE 1 TABLET BY MOUTH  DAILY 90 tablet 3  . metoprolol succinate (TOPROL-XL) 25 MG 24 hr tablet Take 1.5 tablets (37.5 mg) by mouth once daily 135 tablet 3  . montelukast (SINGULAIR) 10 MG tablet TAKE 1 TABLET BY MOUTH EVERYDAY AT BEDTIME 30 tablet 11  . potassium chloride SA (KLOR-CON) 20 MEQ tablet Take 1 tablet (20 mEq  total) by mouth 2 (two) times daily. 180 tablet 3  . warfarin (COUMADIN) 1 MG tablet TAKE 4 TABLETS BY MOUTH ON WEDNESDAYS OR AS DIRECTED BY COUMADIN CLINIC. 30 tablet 1  . warfarin (COUMADIN) 5 MG tablet TAKE 1 TABLET('5MG'$ ) BY MOUTH TUESDAY AND SATURDAY,AND  1/2 TABLET( 2.'5MG'$ ) ALL  OTHER DAYS AS DIRECTED BY  ANTICOAGULATION CLINIC 58 tablet 3   No current facility-administered medications for this visit.     Allergies:   Morphine and related, Percocet [oxycodone-acetaminophen], and Tramadol   Social History:  The patient  reports that she has never smoked. She has never used smokeless tobacco. She reports that she does not drink alcohol and does not use drugs.   Family History:   family history includes Bone cancer in her son; Coronary artery disease (age of onset: 62) in her mother; Diabetes in her daughter and daughter; Emphysema (age of onset: 50) in her father; Hyperlipidemia in her sister and sister; Hypertension in her daughter  and daughter; Hypertension (age of onset: 11) in her mother; Ovarian cancer in her daughter.    Review of Systems: Review of Systems  Constitutional: Negative.   HENT: Negative.   Respiratory: Positive for shortness of breath.   Cardiovascular: Positive for leg swelling.  Gastrointestinal: Negative.        Abdominal bloating  Musculoskeletal: Positive for back pain and joint pain.  Neurological: Negative.   Psychiatric/Behavioral: Negative.   All other systems reviewed and are negative.    PHYSICAL EXAM: VS:  There were no vitals taken for this visit. , BMI There is no height or weight on file to calculate BMI. Constitutional:  oriented to person, place, and time. No distress.  HENT:  Head: Grossly normal Eyes:  no discharge. No scleral icterus.  Neck: No JVD, no carotid bruits  Cardiovascular: Regular rate and rhythm, no murmurs appreciated Pulmonary/Chest: Clear to auscultation bilaterally, no wheezes or rails Abdominal: Soft.  no distension.  no  tenderness.  Musculoskeletal: Normal range of motion Neurological:  normal muscle tone. Coordination normal. No atrophy Skin: Skin warm and dry Psychiatric: normal affect, pleasant   Recent Labs: 05/02/2019: ALT 16 07/20/2019: Hemoglobin 12.7; Platelets 174.0; Pro B Natriuretic peptide (BNP) 412.0 09/11/2019: BUN 13; Creatinine, Ser 0.82; Potassium 4.6; Sodium 142    Lipid Panel Lab Results  Component Value Date   CHOL 125 05/02/2019   HDL 42.50 05/02/2019   LDLCALC 68 05/02/2019   TRIG 76.0 05/02/2019      Wt Readings from Last 3 Encounters:  09/11/19 156 lb (70.8 kg)  07/24/19 161 lb (73 kg)  07/20/19 162 lb (73.5 kg)     ASSESSMENT AND PLAN:  Atrial fibrillation, unspecified type ([place]) - Plan: EKG 12-Lead Chronic atrial fibrillation On metoprolol 37.5 mg daily,  higher dose she had bradycardia No recent falls, tolerating anticoagulation  Chronic diastolic CHF (congestive heart failure) ([place]) - Plan: EKG 12-Lead Unable to tolerate compression hose for leg edema Weight down 6 pounds from prior clinic visit Stay on Lasix 40 twice daily, repeat BMP today  left bundle branch block Seen on prior EKG, resolved on today's EKG  Chronic fatigue Recommend she is try to increase her walking program  Hypertension Blood pressure elevated today, some inconsistency with taking her clonidine and hydralazine Recommend she take both of these medications twice a day  Hyperlipidemia Stressed importance of staying on her statin  Morbid obesity Weight is down, eating less   Total encounter time more than 25 minutes  Greater than 50% was spent in counseling and coordination of care with the patient  Disposition:   F/U  3 months   No orders of the defined types were placed in this encounter.    Signed, Esmond Plants, M.D., Ph.D. 12/09/2019  [place], [place]

## 2019-12-11 ENCOUNTER — Telehealth: Payer: Self-pay | Admitting: Cardiovascular Disease

## 2019-12-11 ENCOUNTER — Ambulatory Visit: Payer: Medicare Other | Admitting: Cardiovascular Disease

## 2019-12-11 NOTE — Telephone Encounter (Signed)
Patient wants to know if she can do a virtual visit due to transportation issues.

## 2019-12-12 ENCOUNTER — Ambulatory Visit (INDEPENDENT_AMBULATORY_CARE_PROVIDER_SITE_OTHER): Payer: Medicare Other

## 2019-12-12 ENCOUNTER — Telehealth: Payer: Self-pay | Admitting: Cardiovascular Disease

## 2019-12-12 ENCOUNTER — Other Ambulatory Visit: Payer: Self-pay

## 2019-12-12 DIAGNOSIS — Z7901 Long term (current) use of anticoagulants: Secondary | ICD-10-CM

## 2019-12-12 LAB — POCT INR: INR: 1.8 — AB (ref 2.0–3.0)

## 2019-12-12 NOTE — Telephone Encounter (Signed)
  Patient Consent for Virtual Visit         [name] has provided verbal consent on 12/12/2019 for a virtual visit (video or telephone).   CONSENT FOR VIRTUAL VISIT FOR:  Angela Ali  By participating in this virtual visit I agree to the following:  I hereby voluntarily request, consent and authorize [facility] and its employed or contracted physicians, physician assistants, nurse practitioners or other licensed health care professionals (the Practitioner), to provide me with telemedicine health care services (the "Services") as deemed necessary by the treating Practitioner. I acknowledge and consent to receive the Services by the Practitioner via telemedicine. I understand that the telemedicine visit will involve communicating with the Practitioner through live audiovisual communication technology and the disclosure of certain medical information by electronic transmission. I acknowledge that I have been given the opportunity to request an in-person assessment or other available alternative prior to the telemedicine visit and am voluntarily participating in the telemedicine visit.  I understand that I have the right to withhold or withdraw my consent to the use of telemedicine in the course of my care at any time, without affecting my right to future care or treatment, and that the Practitioner or I may terminate the telemedicine visit at any time. I understand that I have the right to inspect all information obtained and/or recorded in the course of the telemedicine visit and may receive copies of available information for a reasonable fee.  I understand that some of the potential risks of receiving the Services via telemedicine include:  Marland Kitchen Delay or interruption in medical evaluation due to technological equipment failure or disruption; . Information transmitted may not be sufficient (e.g. poor resolution of images) to allow for appropriate medical decision making by the Practitioner;  and/or  . In rare instances, security protocols could fail, causing a breach of personal health information.  Furthermore, I acknowledge that it is my responsibility to provide information about my medical history, conditions and care that is complete and accurate to the best of my ability. I acknowledge that Practitioner's advice, recommendations, and/or decision may be based on factors not within their control, such as incomplete or inaccurate data provided by me or distortions of diagnostic images or specimens that may result from electronic transmissions. I understand that the practice of medicine is not an exact science and that Practitioner makes no warranties or guarantees regarding treatment outcomes. I acknowledge that a copy of this consent can be made available to me via my patient portal ([facility]), or I can request a printed copy by calling the office of [facility].    I understand that my insurance will be billed for this visit.   I have read or had this consent read to me. . I understand the contents of this consent, which adequately explains the benefits and risks of the Services being provided via telemedicine.  . I have been provided ample opportunity to ask questions regarding this consent and the Services and have had my questions answered to my satisfaction. . I give my informed consent for the services to be provided through the use of telemedicine in my medical care

## 2019-12-12 NOTE — Patient Instructions (Addendum)
Pre visit review using our clinic review tool, if applicable. No additional management support is needed unless otherwise documented below in the visit note.  Take 4 mg today then continue 3 mg daily except take 2.5mg  on Mond, Wed, and Friday. Recheck in 3 wks.

## 2019-12-12 NOTE — Telephone Encounter (Signed)
Pt is compliant with coumadin management and has had a lot of coumadin changes recently.  Sent in refill.

## 2019-12-13 ENCOUNTER — Telehealth (INDEPENDENT_AMBULATORY_CARE_PROVIDER_SITE_OTHER): Payer: Medicare Other | Admitting: Nurse Practitioner

## 2019-12-13 ENCOUNTER — Encounter: Payer: Self-pay | Admitting: Nurse Practitioner

## 2019-12-13 ENCOUNTER — Other Ambulatory Visit: Payer: Self-pay

## 2019-12-13 VITALS — BP 144/85 | HR 72 | Ht 64.0 in | Wt 160.0 lb

## 2019-12-13 DIAGNOSIS — I5032 Chronic diastolic (congestive) heart failure: Secondary | ICD-10-CM | POA: Diagnosis not present

## 2019-12-13 DIAGNOSIS — I4821 Permanent atrial fibrillation: Secondary | ICD-10-CM

## 2019-12-13 DIAGNOSIS — I10 Essential (primary) hypertension: Secondary | ICD-10-CM

## 2019-12-13 NOTE — Patient Instructions (Signed)
Medication Instructions:  1- Continue Clonidine as ordered TAKE ONE-HALF TABLET BY MOUTH TWICE DAILY *If you need a refill on your cardiac medications before your next appointment, please call your pharmacy*   Lab Work: none ordered If you have labs (blood work) drawn today and your tests are completely normal, you will receive your results only by: Marland Kitchen MyChart Message (if you have MyChart) OR . A paper copy in the mail If you have any lab test that is abnormal or we need to change your treatment, we will call you to review the results.   Testing/Procedures: none ordered  Follow-Up: At [facility], you and your health needs are our priority.  As part of our continuing mission to provide you with exceptional heart care, we have created designated Provider Care Teams.  These Care Teams include your primary Cardiologist (physician) and Advanced Practice Providers (APPs -  Physician Assistants and Nurse Practitioners) who all work together to provide you with the care you need, when you need it.  We recommend signing up for the patient portal called "MyChart".  Sign up information is provided on this After Visit Summary.  MyChart is used to connect with patients for Virtual Visits (Telemedicine).  Patients are able to view lab/test results, encounter notes, upcoming appointments, etc.  Non-urgent messages can be sent to your provider as well.   To learn more about what you can do with MyChart, go to [email or web address].    Your next appointment:   Keep appt as scheduled in November

## 2019-12-13 NOTE — Progress Notes (Signed)
Virtual Visit via Telephone Note   This visit type was conducted due to national recommendations for restrictions regarding the COVID-19 Pandemic (e.g. social distancing) in an effort to limit this patient's exposure and mitigate transmission in our community.  Due to her co-morbid illnesses, this patient is at least at moderate risk for complications without adequate follow up.  This format is felt to be most appropriate for this patient at this time.  The patient did not have access to video technology/had technical difficulties with video requiring transitioning to audio format only (telephone).  All issues noted in this document were discussed and addressed.  No physical exam could be performed with this format.  Please refer to the patient's chart for her  consent to telehealth for [facility]. Evaluation Performed:  Follow-up visit  This visit type was conducted due to national recommendations for restrictions regarding the COVID-19 Pandemic (e.g. social distancing).  This format is felt to be most appropriate for this patient at this time.  All issues noted in this document were discussed and addressed.  No physical exam was performed (except for noted visual exam findings with Video Visits).  Please refer to the patient's chart (MyChart message for video visits and phone note for telephone visits) for the patient's consent to telehealth for [facility]. _____________   Date:  12/13/2019   Patient ID:  Angela Ali, DOB [birth year], MRN [id number] Patient Location:  Home Provider location:   Office  Primary Care Provider:  Jinny Sanders, MD Primary Cardiologist:  Ida Rogue, MD  Chief Complaint    86 year old female with a history of permanent atrial fibrillation on chronic Coumadin, hypertension, hyperlipidemia, HFpEF (EF 50-55% April 2021), borderline diabetes, chronic lower extremity edema, pulmonary hypertension, and mild to moderate valvular disease, who presents for  virtual follow-up related to HTN and lower extremity swelling.  Past Medical History    Past Medical History:  Diagnosis Date  . (HFpEF) heart failure with preserved ejection fraction ([place])    a. 08/2009 Echo: EF 45-50%; b. 07/2019 Echo: EF 50-55%, no rwma, sev prox sept hypertrophy. Nl RV fxn. RVSP 69.1mHg. Sev BAE. Mild MR, mod TR. Mild to mod AS.  .Marland KitchenAllergic rhinitis, cause unspecified   . Aortic stenosis    a. 07/2019 Echo: Mild to mod AS.  .[place] KitchenAtrial flutter ([place]   . Bacterial pneumonia, unspecified   . Colonic polyp   . Diabetes mellitus without complication (HCC)    borderline-checks sugars but no meds  . Diaphragmatic hernia without mention of obstruction or gangrene   . Displacement of lumbar intervertebral disc without myelopathy   . Diverticulosis of colon (without mention of hemorrhage)   . Edema   . Esophageal reflux   . Essential hypertension   . Long term (current) use of anticoagulants   . Mixed hyperlipidemia   . Moderate Tricuspid regurgitation   . Osteoarthrosis, unspecified whether generalized or localized, unspecified site   . Other abnormal glucose   . Other malaise and fatigue   . Overweight(278.02)   . PAH (pulmonary artery hypertension) ([place]    a. 07/2019 Echo: RVSP ~ 69.257mg.  . Marland Kitchenermanent atrial fibrillation (HCC)    a. CHA2DS2VASc = 6-->Warfarin.  . Marland Kitchenplenic mass 08/21/2011  . Stricture and stenosis of esophagus   . Thigh abscess    Past Surgical History:  Procedure Laterality Date  . ABDOMINAL HYSTERECTOMY  1971   partial  . ANKLE SURGERY Left 2001  . CHOLECYSTECTOMY  2004  .  COLONOSCOPY    . ESOPHAGEAL DILATION  01/2007  . POLYPECTOMY    . spleen removed     2005 or 2006 in [place]    Allergies  Allergies  Allergen Reactions  . Morphine And Related   . Percocet [Oxycodone-Acetaminophen] Nausea Only       . Tramadol Nausea Only    History of Present Illness    Angela Ali is a 86 y.o. female who presents via audio/video  conferencing for a telehealth visit today.  As outlined above, she has a history of permanent atrial fibrillation on chronic Coumadin, hypertension, hyperlipidemia, HFpEF, borderline diabetes, chronic lower extremity edema, pulmonary hypertension, and mild to moderate valvular disease.  Post recent echo in April 2021 showed EF of 50-55% without regional wall motion abnormalities.  She was noted to have significantly elevated RVSP of 69.2 mmHg and in the setting of chronic edema, was advised to increase Lasix to 40 mg twice daily.  She contacted our office on July 22 in the setting of increasing lower extremity swelling and partial compliance with Lasix therapy.  She was advised to continue Lasix at 40 mg twice daily and also wear compression hose, especially on the right as this was the more affected leg.  Since our last communication, she has noted improvement in lower extremity swelling.  She attributes any swelling to driving back and forth between [place] as she is planning to move at the end of this month.  Her blood pressure was elevated today when our team initially called her however, she has since repeated it and got 144/85.  She remains relatively sedentary but does not experience chest pain or dyspnea.  She is taking her blood pressure medicines but notes that she has been taking clonidine 0.1 mg daily instead of half a tablet twice daily.  She reports compliance with all other medications.  She denies palpitations, PND, orthopnea, dizziness, syncope, or early satiety.  The patient does not have symptoms concerning for COVID-19 infection (fever, chills, cough, or new shortness of breath).  She has not yet been vaccinated but after our discussion today, is going to ask her daughter to take her for vaccination this week.  Home Medications    Prior to Admission medications   Medication Sig Start Date End Date Taking? Authorizing Provider  ACCU-CHEK FASTCLIX LANCETS MISC Use to check blood sugar 2  times a day.  Dx: E11.8 04/29/18   Jinny Sanders, MD  ACCU-CHEK GUIDE test strip USE TO CHECK BLOOD SUGAR  TWO TIMES DAILY 10/02/19   Bedsole, [name] E, MD  acetaminophen (TYLENOL ARTHRITIS PAIN) 650 MG CR tablet Take 650 mg by mouth daily.    [provider]  Ascorbic Acid (VITAMIN C PO) Take 1 tablet by mouth daily.    [provider]  atorvastatin (LIPITOR) 40 MG tablet TAKE 2 TABLETS BY MOUTH  DAILY AT 6 PM. 06/30/19   Bedsole, [name] E, MD  azelastine (ASTELIN) 0.1 % nasal spray Place 2 sprays into both nostrils 2 (two) times daily. Use in each nostril as directed 09/07/19   Bedsole, [name] E, MD  benzonatate (TESSALON) 100 MG capsule TAKE 2 CAPSULES BY MOUTH TWICE DAILY AS NEEDED FOR COUGH 12/29/18   Bedsole, [name] E, MD  Blood Glucose Monitoring Suppl (ACCU-CHEK GUIDE) w/Device KIT 1 each by Does not apply route 2 (two) times daily. Dx: E11.8 06/28/17   Jinny Sanders, MD  Cholecalciferol (VITAMIN D-3 PO) Take 1 tablet by mouth daily.  [provider]  cloNIDine (CATAPRES) 0.1 MG tablet TAKE ONE half TABLET BY  MOUTH TWICE DAILY 06/30/19   Bedsole, [name] E, MD  cyclobenzaprine (FLEXERIL) 5 MG tablet Take 1 tablet (5 mg total) by mouth at bedtime. 03/22/19   Bedsole, [name] E, MD  diazepam (VALIUM) 2 MG tablet Take 1 tablet (2 mg total) by mouth every 8 (eight) hours as needed for anxiety. 09/13/19   Copland, Frederico Hamman, MD  digoxin (LANOXIN) 0.125 MG tablet TAKE 1 TABLET BY MOUTH  DAILY 06/30/19   Bedsole, [name] E, MD  esomeprazole (NEXIUM) 40 MG capsule TAKE 1 CAPSULE BY MOUTH EVERY DAY BEFORE BREAKFAST 10/03/19   Esterwood, [name] S, PA-C  fexofenadine (ALLEGRA) 180 MG tablet Take 1 tablet (180 mg total) by mouth daily. 05/18/19   Bedsole, [name] E, MD  fluticasone (FLONASE) 50 MCG/ACT nasal spray daily.  04/29/19   [provider]  Fluticasone-Salmeterol (ADVAIR) 250-50 MCG/DOSE AEPB Inhale 1 puff into the lungs 2 (two) times daily. 04/29/18   Bedsole, [name] E, MD  furosemide (LASIX) 40 MG tablet Take  1 tablet (40 mg total) by mouth 2 (two) times daily. 12/01/19 02/29/20  Minna Merritts, MD  hydrALAZINE (APRESOLINE) 50 MG tablet TAKE 1 TABLET BY MOUTH 3  TIMES DAILY 06/30/19   Bedsole, [name] E, MD  hydrocortisone-pramoxine [facility]) 2.5-1 % rectal cream Place 1 application rectally 3 (three) times daily. 07/19/18   Bedsole, [name] E, MD  hydrOXYzine (ATARAX/VISTARIL) 10 MG tablet TAKE 0.5-1 TABLET (5-10 MG TOTAL) BY MOUTH 2 (TWO) TIMES DAILY AS NEEDED. 09/08/19   Bedsole, [name] E, MD  latanoprost (XALATAN) 0.005 % ophthalmic solution  05/04/14   [provider]  levocetirizine (XYZAL) 5 MG tablet Take 1 tablet (5 mg total) by mouth every evening. 01/31/19   Bedsole, [name] E, MD  losartan (COZAAR) 100 MG tablet TAKE 1 TABLET BY MOUTH  DAILY 06/30/19   Bedsole, [name] E, MD  metoprolol succinate (TOPROL-XL) 25 MG 24 hr tablet Take 1.5 tablets (37.5 mg) by mouth once daily 05/23/19   Loel Dubonnet, NP  montelukast (SINGULAIR) 10 MG tablet TAKE 1 TABLET BY MOUTH EVERYDAY AT BEDTIME 06/09/19   Bedsole, [name] E, MD  potassium chloride SA (KLOR-CON) 20 MEQ tablet Take 1 tablet (20 mEq total) by mouth 2 (two) times daily. 07/31/19   Minna Merritts, MD  warfarin (COUMADIN) 1 MG tablet TAKE 4 TABLETS BY MOUTH ON WEDNESDAYS OR AS DIRECTED BY COUMADIN CLINIC. 12/12/19   Bedsole, [name] E, MD  warfarin (COUMADIN) 5 MG tablet TAKE 1 TABLET(5MG) BY MOUTH TUESDAY AND SATURDAY,AND  1/2 TABLET( 2.5MG) ALL  OTHER DAYS AS DIRECTED BY  ANTICOAGULATION CLINIC 05/09/19   Jinny Sanders, MD    Review of Systems    Chronic intermittent lower extremity edema.  She denies chest pain, dyspnea, palpitations, PND, orthopnea, dizziness, syncope, or early satiety.  All other systems reviewed and are otherwise negative except as noted above.  Physical Exam    Vital Signs:  BP (!) 144/85 (BP Location: Left Arm, Patient Position: Sitting, Cuff Size: Normal)   Pulse 72   Ht _0  (1.626 m)   Wt 160 lb (72.6 kg)   BMI 27.46 kg/m     Exam limited secondary to phone visit.  She is awake alert and oriented x3.  Pleasant.  No obvious acute distress.  Speech is unlabored.  Accessory Clinical Findings     Lab Results  Component Value Date   CREATININE 0.82 09/11/2019  BUN 13 09/11/2019   NA 142 09/11/2019   K 4.6 09/11/2019   CL 103 09/11/2019   CO2 28 09/11/2019    Lab Results  Component Value Date   WBC 12.1 (H) 07/20/2019   HGB 12.7 07/20/2019   HCT 37.6 07/20/2019   MCV 85.5 07/20/2019   PLT 174.0 07/20/2019    Lab Results  Component Value Date   INR 1.8 (A) 12/12/2019   INR 4.2 (A) 12/05/2019   INR 1.4 (A) 11/21/2019    Lab Results  Component Value Date   LDLCALC 68 05/02/2019    Assessment & Plan    1.  Essential hypertension: Blood pressure was elevated in the one nineties this morning however on call back, she is down to 144/85.  We discussed her blood pressure medications today, and she has been taking clonidine 0.1 mg daily instead of half a tablet twice daily.  She otherwise reports compliance with hydralazine, Lasix, losartan, and metoprolol.  I have asked her to take clonidine as prescribed-0.1 mg half a tablet twice daily and explained the reasoning behind that.  Her medicine list that she has at home reflects twice daily dosing.  I asked her to continue to follow her blood pressure at home and contact us if pressures continue to trend greater than 559 systolic, at which time we can consider either further titration of clonidine to 0.1 mg twice daily or titration of hydralazine.  2.  Permanent atrial fibrillation: Heart rate recorded at 72 this morning.  She denies any palpitations.  She remains on beta-blocker, digoxin, and warfarin therapy.  INR was supratherapeutic recently and was subtherapeutic yesterday at 1.8.  Dose was increased slightly yesterday.  3.  Chronic diastolic congestive heart failure/chronic lower extremity edema: Weight is listed is up 4 pounds since her last visit here  however, she notes that swelling has resolved and she has been feeling well.  We discussed the importance of daily weights, sodium restriction, medication compliance, and symptom reporting and she verbalizes understanding.   4.  Hyperlipidemia: Most recent LDL was 68 in January.  She remains on statin therapy.  5.  Disposition: Patient is scheduled follow-up with Dr. Rockey Situ in November.  She is planning on moving to [facility] at the end of this month and will notify us when she establishes care there in case records need to be sent.  COVID-19 Education: The signs and symptoms of COVID-19 were discussed with the patient and how to seek care for testing (follow up with PCP or arrange E-visit).  The importance of social distancing was discussed today.  Patient Risk:   After full review of this patient's history and clinical status, I feel that he is at least moderate risk for cardiac complications at this time, thus necessitating a telehealth visit sooner than our first available in office visit.  Time:   Today, I have spent 18 minutes with the patient with telehealth technology discussing medical history, symptoms, and management plan.     Murray Hodgkins, NP 12/13/2019, 12:38 PM

## 2019-12-19 ENCOUNTER — Other Ambulatory Visit: Payer: Self-pay | Admitting: Family Medicine

## 2019-12-19 NOTE — Telephone Encounter (Signed)
Last office visit 09/07/2019 for URI.  Last refilled 04/29/2018 for 3 each with 3 refills.  No future appointments with PCP.  Ok to refill?

## 2019-12-25 ENCOUNTER — Ambulatory Visit (INDEPENDENT_AMBULATORY_CARE_PROVIDER_SITE_OTHER): Payer: Medicare Other

## 2019-12-25 ENCOUNTER — Telehealth: Payer: Self-pay | Admitting: Family Medicine

## 2019-12-25 DIAGNOSIS — Z7901 Long term (current) use of anticoagulants: Secondary | ICD-10-CM

## 2019-12-25 MED ORDER — WARFARIN SODIUM 1 MG PO TABS: ORAL_TABLET | ORAL | 1 refills | Status: DC

## 2019-12-25 NOTE — Patient Instructions (Addendum)
Pre visit review using our clinic review tool, if applicable. No additional management support is needed unless otherwise documented below in the visit note.  Contacted pt due to her calling to report she was out of 1mg  prescription and missed dose yesterday. Sent in new script to [facility]. When contacted pt she said she moved to [place] and needed script sent there. Sent new script there. Advised pt to change dosing today. Continue dose today to 5 mg then continue 3 mg daily except take 2.5mg  on Mon, Wed and Fri and recheck on 01/02/20.  Pt verbalized understanding.

## 2019-12-25 NOTE — Telephone Encounter (Signed)
Pt stated she missed her warfin 1mg  for Sunday.  She missed all three doses  Pharmacy stated directions were incorrct.  She stated it needs to say take 3 pill on Sunday Tuesday thursday Saturday.  [facility] [address] [employer]

## 2019-12-25 NOTE — Telephone Encounter (Signed)
Contacted pt and advised of dose change. This was documented in anticoag encounter.

## 2019-12-25 NOTE — Telephone Encounter (Signed)
Updated sig on prescription to include last dose change and sent in new script for 1mg  prescription.

## 2020-01-02 ENCOUNTER — Telehealth: Payer: Self-pay

## 2020-01-02 ENCOUNTER — Ambulatory Visit (INDEPENDENT_AMBULATORY_CARE_PROVIDER_SITE_OTHER): Payer: Medicare Other

## 2020-01-02 ENCOUNTER — Other Ambulatory Visit: Payer: Self-pay

## 2020-01-02 ENCOUNTER — Other Ambulatory Visit: Payer: Self-pay | Admitting: Family Medicine

## 2020-01-02 DIAGNOSIS — J3489 Other specified disorders of nose and nasal sinuses: Secondary | ICD-10-CM

## 2020-01-02 DIAGNOSIS — Z7901 Long term (current) use of anticoagulants: Secondary | ICD-10-CM | POA: Diagnosis not present

## 2020-01-02 DIAGNOSIS — M170 Bilateral primary osteoarthritis of knee: Secondary | ICD-10-CM

## 2020-01-02 LAB — POCT INR: INR: 2.1 (ref 2.0–3.0)

## 2020-01-02 NOTE — Patient Instructions (Addendum)
Pre visit review using our clinic review tool, if applicable. No additional management support is needed unless otherwise documented below in the visit note.  Continue 3 mg daily except take 2.5mg  on Mon, Wed and Fri and recheck in 4 wks.

## 2020-01-02 NOTE — Telephone Encounter (Signed)
Patient contacted the office and states she is needing an order for a toiler seat. She states her toilets are very low, and with her leg/back problems, it is hard for her to sit down that low. Dr. Diona Browner, please advise.

## 2020-01-02 NOTE — Telephone Encounter (Signed)
Pt was in for coumadin clinic today and reported she did need an order for a raised toilet seat. She would like this order faxed to [facility], [place], [place].

## 2020-01-02 NOTE — Telephone Encounter (Signed)
Placed DME order for raided toilet seat please fax to [facility], [place], [place].

## 2020-01-02 NOTE — Addendum Note (Signed)
Addended by: Eliezer Lofts E on: 01/02/2020 05:13 PM   Modules accepted: Orders

## 2020-01-03 NOTE — Telephone Encounter (Signed)
Order faxed to [facility] as requested at [phone].

## 2020-01-12 DIAGNOSIS — L0501 Pilonidal cyst with abscess: Secondary | ICD-10-CM | POA: Diagnosis not present

## 2020-01-12 DIAGNOSIS — Z7901 Long term (current) use of anticoagulants: Secondary | ICD-10-CM | POA: Diagnosis not present

## 2020-01-12 DIAGNOSIS — K649 Unspecified hemorrhoids: Secondary | ICD-10-CM | POA: Diagnosis not present

## 2020-01-15 DIAGNOSIS — Z961 Presence of intraocular lens: Secondary | ICD-10-CM | POA: Diagnosis not present

## 2020-01-15 DIAGNOSIS — H401134 Primary open-angle glaucoma, bilateral, indeterminate stage: Secondary | ICD-10-CM | POA: Diagnosis not present

## 2020-01-15 DIAGNOSIS — H5051 Esophoria: Secondary | ICD-10-CM | POA: Diagnosis not present

## 2020-01-16 ENCOUNTER — Other Ambulatory Visit: Payer: Self-pay | Admitting: Family Medicine

## 2020-01-16 DIAGNOSIS — Z7901 Long term (current) use of anticoagulants: Secondary | ICD-10-CM

## 2020-01-16 NOTE — Telephone Encounter (Signed)
Pt is compliant with coumadin management. Sent in refill 

## 2020-01-24 DIAGNOSIS — E785 Hyperlipidemia, unspecified: Secondary | ICD-10-CM | POA: Diagnosis not present

## 2020-01-24 DIAGNOSIS — I10 Essential (primary) hypertension: Secondary | ICD-10-CM | POA: Diagnosis not present

## 2020-01-24 DIAGNOSIS — I4821 Permanent atrial fibrillation: Secondary | ICD-10-CM | POA: Diagnosis not present

## 2020-01-24 DIAGNOSIS — Z7901 Long term (current) use of anticoagulants: Secondary | ICD-10-CM | POA: Diagnosis not present

## 2020-01-24 DIAGNOSIS — R7303 Prediabetes: Secondary | ICD-10-CM | POA: Diagnosis not present

## 2020-01-30 ENCOUNTER — Telehealth: Payer: Self-pay | Admitting: Cardiovascular Disease

## 2020-01-30 ENCOUNTER — Ambulatory Visit (INDEPENDENT_AMBULATORY_CARE_PROVIDER_SITE_OTHER): Payer: Medicare Other

## 2020-01-30 DIAGNOSIS — Z7901 Long term (current) use of anticoagulants: Secondary | ICD-10-CM

## 2020-01-30 LAB — POCT INR: INR: 3.5 — AB (ref 2.0–3.0)

## 2020-01-30 NOTE — Patient Instructions (Addendum)
Pre visit review using our clinic review tool, if applicable. No additional management support is needed unless otherwise documented below in the visit note.  Hold dose today then change weekly dose to take 2.5mg  daily. Make sure INR is rechecked in 3 weeks, around 02/20/20. If any difficulties finding a provider to manage the patients coumadin within that time please contact this office at [phone] and ask for the coumadin clinic nurse, Larene Beach, RN.

## 2020-01-30 NOTE — Telephone Encounter (Signed)
Patient moved to [facility]   [address]  [place]    [phone]  Please send records   Confirmed fax : [phone]

## 2020-02-05 NOTE — Telephone Encounter (Signed)
Routed via epic to [place] at confirmed fax :  Last ov and telehealth note  EKG  Echo  Nuc Med Study

## 2020-02-06 DIAGNOSIS — K219 Gastro-esophageal reflux disease without esophagitis: Secondary | ICD-10-CM | POA: Diagnosis not present

## 2020-02-06 DIAGNOSIS — Z7689 Persons encountering health services in other specified circumstances: Secondary | ICD-10-CM | POA: Diagnosis not present

## 2020-02-06 DIAGNOSIS — N644 Mastodynia: Secondary | ICD-10-CM | POA: Diagnosis not present

## 2020-02-06 DIAGNOSIS — I4821 Permanent atrial fibrillation: Secondary | ICD-10-CM | POA: Diagnosis not present

## 2020-02-06 DIAGNOSIS — Z7901 Long term (current) use of anticoagulants: Secondary | ICD-10-CM | POA: Diagnosis not present

## 2020-02-07 DIAGNOSIS — H5051 Esophoria: Secondary | ICD-10-CM | POA: Diagnosis not present

## 2020-02-07 DIAGNOSIS — H401132 Primary open-angle glaucoma, bilateral, moderate stage: Secondary | ICD-10-CM | POA: Diagnosis not present

## 2020-02-14 DIAGNOSIS — I4891 Unspecified atrial fibrillation: Secondary | ICD-10-CM | POA: Diagnosis not present

## 2020-02-15 ENCOUNTER — Other Ambulatory Visit: Payer: Self-pay | Admitting: Family Medicine

## 2020-02-15 DIAGNOSIS — Z7901 Long term (current) use of anticoagulants: Secondary | ICD-10-CM

## 2020-02-16 ENCOUNTER — Ambulatory Visit: Payer: Medicare Other | Admitting: Podiatry

## 2020-02-16 NOTE — Telephone Encounter (Signed)
Pt is compliant with coumadin management. Gave 90 day fill, enough for pt to establish care with provider in her new location.

## 2020-02-19 DIAGNOSIS — J3489 Other specified disorders of nose and nasal sinuses: Secondary | ICD-10-CM | POA: Diagnosis not present

## 2020-02-19 DIAGNOSIS — R1013 Epigastric pain: Secondary | ICD-10-CM | POA: Diagnosis not present

## 2020-02-20 ENCOUNTER — Other Ambulatory Visit: Payer: Self-pay | Admitting: Family Medicine

## 2020-02-20 DIAGNOSIS — Z7901 Long term (current) use of anticoagulants: Secondary | ICD-10-CM | POA: Diagnosis not present

## 2020-02-20 DIAGNOSIS — I4821 Permanent atrial fibrillation: Secondary | ICD-10-CM | POA: Diagnosis not present

## 2020-02-20 DIAGNOSIS — Z5181 Encounter for therapeutic drug level monitoring: Secondary | ICD-10-CM | POA: Diagnosis not present

## 2020-02-21 DIAGNOSIS — H401132 Primary open-angle glaucoma, bilateral, moderate stage: Secondary | ICD-10-CM | POA: Diagnosis not present

## 2020-02-21 DIAGNOSIS — H5051 Esophoria: Secondary | ICD-10-CM | POA: Diagnosis not present

## 2020-02-21 NOTE — Telephone Encounter (Signed)
Refused refill because pt no longer needs this dose and pt is establishing with new provider.

## 2020-02-22 ENCOUNTER — Ambulatory Visit: Payer: Self-pay

## 2020-02-26 DIAGNOSIS — K219 Gastro-esophageal reflux disease without esophagitis: Secondary | ICD-10-CM | POA: Diagnosis not present

## 2020-02-26 DIAGNOSIS — J328 Other chronic sinusitis: Secondary | ICD-10-CM | POA: Diagnosis not present

## 2020-02-26 DIAGNOSIS — J4520 Mild intermittent asthma, uncomplicated: Secondary | ICD-10-CM | POA: Diagnosis not present

## 2020-03-07 DIAGNOSIS — R140 Abdominal distension (gaseous): Secondary | ICD-10-CM | POA: Diagnosis not present

## 2020-03-08 ENCOUNTER — Other Ambulatory Visit: Payer: Self-pay | Admitting: Family Medicine

## 2020-03-08 DIAGNOSIS — Z7901 Long term (current) use of anticoagulants: Secondary | ICD-10-CM

## 2020-03-13 DIAGNOSIS — N644 Mastodynia: Secondary | ICD-10-CM | POA: Diagnosis not present

## 2020-03-13 DIAGNOSIS — R921 Mammographic calcification found on diagnostic imaging of breast: Secondary | ICD-10-CM | POA: Diagnosis not present

## 2020-03-13 DIAGNOSIS — K219 Gastro-esophageal reflux disease without esophagitis: Secondary | ICD-10-CM | POA: Diagnosis not present

## 2020-03-13 DIAGNOSIS — J328 Other chronic sinusitis: Secondary | ICD-10-CM | POA: Diagnosis not present

## 2020-03-15 ENCOUNTER — Ambulatory Visit: Payer: Medicare Other | Admitting: Cardiovascular Disease

## 2020-03-15 DIAGNOSIS — K449 Diaphragmatic hernia without obstruction or gangrene: Secondary | ICD-10-CM | POA: Diagnosis not present

## 2020-03-28 DIAGNOSIS — J300 Vasomotor rhinitis: Secondary | ICD-10-CM | POA: Diagnosis not present

## 2020-03-28 DIAGNOSIS — J310 Chronic rhinitis: Secondary | ICD-10-CM | POA: Diagnosis not present

## 2020-03-28 DIAGNOSIS — R053 Chronic cough: Secondary | ICD-10-CM | POA: Diagnosis not present

## 2020-03-28 DIAGNOSIS — R0981 Nasal congestion: Secondary | ICD-10-CM | POA: Diagnosis not present

## 2020-04-11 DIAGNOSIS — Z131 Encounter for screening for diabetes mellitus: Secondary | ICD-10-CM | POA: Diagnosis not present

## 2020-04-11 DIAGNOSIS — Z7901 Long term (current) use of anticoagulants: Secondary | ICD-10-CM | POA: Diagnosis not present

## 2020-04-11 DIAGNOSIS — E785 Hyperlipidemia, unspecified: Secondary | ICD-10-CM | POA: Diagnosis not present

## 2020-04-11 DIAGNOSIS — Z1329 Encounter for screening for other suspected endocrine disorder: Secondary | ICD-10-CM | POA: Diagnosis not present

## 2020-04-11 DIAGNOSIS — I4821 Permanent atrial fibrillation: Secondary | ICD-10-CM | POA: Diagnosis not present

## 2020-04-11 DIAGNOSIS — Z5181 Encounter for therapeutic drug level monitoring: Secondary | ICD-10-CM | POA: Diagnosis not present

## 2020-04-11 DIAGNOSIS — Z1321 Encounter for screening for nutritional disorder: Secondary | ICD-10-CM | POA: Diagnosis not present

## 2020-04-11 DIAGNOSIS — Z130 Encounter for screening for diseases of the blood and blood-forming organs and certain disorders involving the immune mechanism: Secondary | ICD-10-CM | POA: Diagnosis not present

## 2020-04-17 DIAGNOSIS — Z0000 Encounter for general adult medical examination without abnormal findings: Secondary | ICD-10-CM | POA: Diagnosis not present

## 2020-04-17 DIAGNOSIS — L0233 Carbuncle of buttock: Secondary | ICD-10-CM | POA: Diagnosis not present

## 2020-04-23 DIAGNOSIS — M2042 Other hammer toe(s) (acquired), left foot: Secondary | ICD-10-CM | POA: Diagnosis not present

## 2020-04-23 DIAGNOSIS — B351 Tinea unguium: Secondary | ICD-10-CM | POA: Diagnosis not present

## 2020-05-06 ENCOUNTER — Other Ambulatory Visit: Payer: Self-pay | Admitting: Family Medicine

## 2020-05-06 ENCOUNTER — Other Ambulatory Visit: Payer: Self-pay | Admitting: Family

## 2020-05-06 DIAGNOSIS — R140 Abdominal distension (gaseous): Secondary | ICD-10-CM | POA: Diagnosis not present

## 2020-05-06 DIAGNOSIS — Z5181 Encounter for therapeutic drug level monitoring: Secondary | ICD-10-CM | POA: Diagnosis not present

## 2020-05-06 DIAGNOSIS — Z7901 Long term (current) use of anticoagulants: Secondary | ICD-10-CM | POA: Diagnosis not present

## 2020-05-06 DIAGNOSIS — R1012 Left upper quadrant pain: Secondary | ICD-10-CM | POA: Diagnosis not present

## 2020-05-06 DIAGNOSIS — I4891 Unspecified atrial fibrillation: Secondary | ICD-10-CM | POA: Diagnosis not present

## 2020-05-06 DIAGNOSIS — J3489 Other specified disorders of nose and nasal sinuses: Secondary | ICD-10-CM

## 2020-05-07 ENCOUNTER — Telehealth: Payer: Self-pay

## 2020-05-07 NOTE — Telephone Encounter (Signed)
Left voicemail message requesting to have patient call back RE: refill request received electronically by pharmacy.  Spoke with patient's daughter who states patient has moved and she thinks she is now under the care of a different cardiologist.

## 2020-05-10 ENCOUNTER — Other Ambulatory Visit: Payer: Self-pay | Admitting: Family Medicine

## 2020-05-22 ENCOUNTER — Other Ambulatory Visit: Payer: Self-pay | Admitting: Family

## 2020-05-22 ENCOUNTER — Other Ambulatory Visit: Payer: Self-pay | Admitting: Family Medicine

## 2020-05-22 ENCOUNTER — Other Ambulatory Visit: Payer: Self-pay | Admitting: Cardiovascular Disease

## 2020-05-22 DIAGNOSIS — J3489 Other specified disorders of nose and nasal sinuses: Secondary | ICD-10-CM

## 2020-05-26 ENCOUNTER — Other Ambulatory Visit: Payer: Self-pay | Admitting: Family Medicine

## 2020-05-27 ENCOUNTER — Telehealth: Payer: Self-pay | Admitting: Family Medicine

## 2020-05-27 NOTE — Telephone Encounter (Signed)
Patient states that she received a call from [place]. NO notes in the system. Patient has moved to [place] and will no longer be in our practice. EM

## 2020-05-28 DIAGNOSIS — J309 Allergic rhinitis, unspecified: Secondary | ICD-10-CM | POA: Diagnosis not present

## 2020-05-28 DIAGNOSIS — R0982 Postnasal drip: Secondary | ICD-10-CM | POA: Diagnosis not present

## 2020-06-03 DIAGNOSIS — H5051 Esophoria: Secondary | ICD-10-CM | POA: Diagnosis not present

## 2020-06-03 DIAGNOSIS — Z7901 Long term (current) use of anticoagulants: Secondary | ICD-10-CM | POA: Diagnosis not present

## 2020-06-03 DIAGNOSIS — Z5181 Encounter for therapeutic drug level monitoring: Secondary | ICD-10-CM | POA: Diagnosis not present

## 2020-06-03 DIAGNOSIS — I4821 Permanent atrial fibrillation: Secondary | ICD-10-CM | POA: Diagnosis not present

## 2020-06-03 DIAGNOSIS — H401132 Primary open-angle glaucoma, bilateral, moderate stage: Secondary | ICD-10-CM | POA: Diagnosis not present

## 2020-06-08 ENCOUNTER — Other Ambulatory Visit: Payer: Self-pay | Admitting: Cardiovascular Disease

## 2020-06-15 DIAGNOSIS — H1132 Conjunctival hemorrhage, left eye: Secondary | ICD-10-CM | POA: Diagnosis not present

## 2020-06-15 DIAGNOSIS — I10 Essential (primary) hypertension: Secondary | ICD-10-CM | POA: Diagnosis not present

## 2020-06-19 DIAGNOSIS — R1012 Left upper quadrant pain: Secondary | ICD-10-CM | POA: Diagnosis not present

## 2020-06-19 DIAGNOSIS — K5730 Diverticulosis of large intestine without perforation or abscess without bleeding: Secondary | ICD-10-CM | POA: Diagnosis not present

## 2020-06-19 DIAGNOSIS — R140 Abdominal distension (gaseous): Secondary | ICD-10-CM | POA: Diagnosis not present

## 2020-06-19 DIAGNOSIS — I517 Cardiomegaly: Secondary | ICD-10-CM | POA: Diagnosis not present

## 2020-07-01 DIAGNOSIS — Z7901 Long term (current) use of anticoagulants: Secondary | ICD-10-CM | POA: Diagnosis not present

## 2020-07-01 DIAGNOSIS — I4891 Unspecified atrial fibrillation: Secondary | ICD-10-CM | POA: Diagnosis not present

## 2020-07-01 DIAGNOSIS — Z5181 Encounter for therapeutic drug level monitoring: Secondary | ICD-10-CM | POA: Diagnosis not present

## 2020-07-29 DIAGNOSIS — I4821 Permanent atrial fibrillation: Secondary | ICD-10-CM | POA: Diagnosis not present

## 2020-07-29 DIAGNOSIS — Z5181 Encounter for therapeutic drug level monitoring: Secondary | ICD-10-CM | POA: Diagnosis not present

## 2020-07-29 DIAGNOSIS — Z7901 Long term (current) use of anticoagulants: Secondary | ICD-10-CM | POA: Diagnosis not present

## 2020-08-01 DIAGNOSIS — K769 Liver disease, unspecified: Secondary | ICD-10-CM | POA: Diagnosis not present

## 2020-08-06 ENCOUNTER — Other Ambulatory Visit: Payer: Self-pay | Admitting: Family Medicine

## 2020-08-08 DIAGNOSIS — E785 Hyperlipidemia, unspecified: Secondary | ICD-10-CM | POA: Diagnosis not present

## 2020-08-08 DIAGNOSIS — J4520 Mild intermittent asthma, uncomplicated: Secondary | ICD-10-CM | POA: Diagnosis not present

## 2020-08-08 DIAGNOSIS — I4811 Longstanding persistent atrial fibrillation: Secondary | ICD-10-CM | POA: Diagnosis not present

## 2020-08-08 DIAGNOSIS — I10 Essential (primary) hypertension: Secondary | ICD-10-CM | POA: Diagnosis not present

## 2020-08-08 DIAGNOSIS — J3489 Other specified disorders of nose and nasal sinuses: Secondary | ICD-10-CM | POA: Diagnosis not present

## 2020-08-08 DIAGNOSIS — I4891 Unspecified atrial fibrillation: Secondary | ICD-10-CM | POA: Diagnosis not present

## 2020-08-08 DIAGNOSIS — E782 Mixed hyperlipidemia: Secondary | ICD-10-CM | POA: Diagnosis not present

## 2020-08-08 DIAGNOSIS — I272 Pulmonary hypertension, unspecified: Secondary | ICD-10-CM | POA: Diagnosis not present

## 2020-08-14 DIAGNOSIS — I4821 Permanent atrial fibrillation: Secondary | ICD-10-CM | POA: Diagnosis not present

## 2020-08-14 DIAGNOSIS — Z7901 Long term (current) use of anticoagulants: Secondary | ICD-10-CM | POA: Diagnosis not present

## 2020-08-14 DIAGNOSIS — I10 Essential (primary) hypertension: Secondary | ICD-10-CM | POA: Diagnosis not present

## 2020-08-14 DIAGNOSIS — R7303 Prediabetes: Secondary | ICD-10-CM | POA: Diagnosis not present

## 2020-08-14 DIAGNOSIS — R971 Elevated cancer antigen 125 [CA 125]: Secondary | ICD-10-CM | POA: Diagnosis not present

## 2020-08-15 ENCOUNTER — Other Ambulatory Visit: Payer: Self-pay | Admitting: Family Medicine

## 2020-08-15 DIAGNOSIS — Z7901 Long term (current) use of anticoagulants: Secondary | ICD-10-CM

## 2020-08-15 DIAGNOSIS — J4520 Mild intermittent asthma, uncomplicated: Secondary | ICD-10-CM | POA: Diagnosis not present

## 2020-08-15 DIAGNOSIS — K219 Gastro-esophageal reflux disease without esophagitis: Secondary | ICD-10-CM | POA: Diagnosis not present

## 2020-08-19 DIAGNOSIS — R059 Cough, unspecified: Secondary | ICD-10-CM | POA: Diagnosis not present

## 2020-08-19 DIAGNOSIS — R1013 Epigastric pain: Secondary | ICD-10-CM | POA: Diagnosis not present

## 2020-08-19 DIAGNOSIS — K219 Gastro-esophageal reflux disease without esophagitis: Secondary | ICD-10-CM | POA: Diagnosis not present

## 2020-08-23 DIAGNOSIS — I4891 Unspecified atrial fibrillation: Secondary | ICD-10-CM | POA: Diagnosis not present

## 2020-08-23 DIAGNOSIS — I110 Hypertensive heart disease with heart failure: Secondary | ICD-10-CM | POA: Diagnosis not present

## 2020-08-23 DIAGNOSIS — Z7901 Long term (current) use of anticoagulants: Secondary | ICD-10-CM | POA: Diagnosis not present

## 2020-08-23 DIAGNOSIS — R0602 Shortness of breath: Secondary | ICD-10-CM | POA: Diagnosis not present

## 2020-08-23 DIAGNOSIS — R600 Localized edema: Secondary | ICD-10-CM | POA: Diagnosis not present

## 2020-08-23 DIAGNOSIS — I517 Cardiomegaly: Secondary | ICD-10-CM | POA: Diagnosis not present

## 2020-08-23 DIAGNOSIS — I509 Heart failure, unspecified: Secondary | ICD-10-CM | POA: Diagnosis not present

## 2020-08-24 DIAGNOSIS — R0602 Shortness of breath: Secondary | ICD-10-CM | POA: Diagnosis not present

## 2020-08-24 DIAGNOSIS — R600 Localized edema: Secondary | ICD-10-CM | POA: Diagnosis not present

## 2020-08-29 DIAGNOSIS — K449 Diaphragmatic hernia without obstruction or gangrene: Secondary | ICD-10-CM | POA: Diagnosis not present

## 2020-08-29 DIAGNOSIS — I4821 Permanent atrial fibrillation: Secondary | ICD-10-CM | POA: Diagnosis not present

## 2020-08-29 DIAGNOSIS — R140 Abdominal distension (gaseous): Secondary | ICD-10-CM | POA: Diagnosis not present

## 2020-08-29 DIAGNOSIS — R933 Abnormal findings on diagnostic imaging of other parts of digestive tract: Secondary | ICD-10-CM | POA: Diagnosis not present

## 2020-12-08 ENCOUNTER — Other Ambulatory Visit: Payer: Self-pay | Admitting: Cardiovascular Disease

## 2020-12-11 ENCOUNTER — Ambulatory Visit: Payer: Medicare Other | Admitting: Podiatry

## 2021-01-25 DEATH — deceased
